# Patient Record
Sex: Female | Born: 1965 | Race: White | Hispanic: No | Marital: Married
Health system: Southern US, Community
[De-identification: ages and names within clinical notes are randomized; demographics above are authoritative.]

## PROBLEM LIST (undated history)

## (undated) DIAGNOSIS — G43909 Migraine, unspecified, not intractable, without status migrainosus: Secondary | ICD-10-CM

## (undated) DIAGNOSIS — G5603 Carpal tunnel syndrome, bilateral upper limbs: Secondary | ICD-10-CM

## (undated) DIAGNOSIS — Z98811 Dental restoration status: Secondary | ICD-10-CM

## (undated) DIAGNOSIS — I10 Essential (primary) hypertension: Secondary | ICD-10-CM

## (undated) DIAGNOSIS — F17210 Nicotine dependence, cigarettes, uncomplicated: Secondary | ICD-10-CM

## (undated) DIAGNOSIS — K760 Fatty (change of) liver, not elsewhere classified: Secondary | ICD-10-CM

## (undated) DIAGNOSIS — R7989 Other specified abnormal findings of blood chemistry: Secondary | ICD-10-CM

## (undated) DIAGNOSIS — M1990 Unspecified osteoarthritis, unspecified site: Secondary | ICD-10-CM

## (undated) DIAGNOSIS — R945 Abnormal results of liver function studies: Secondary | ICD-10-CM

## (undated) DIAGNOSIS — Z460 Encounter for fitting and adjustment of spectacles and contact lenses: Secondary | ICD-10-CM

## (undated) DIAGNOSIS — Z972 Presence of dental prosthetic device (complete) (partial): Secondary | ICD-10-CM

## (undated) DIAGNOSIS — G4700 Insomnia, unspecified: Secondary | ICD-10-CM

## (undated) DIAGNOSIS — G4750 Parasomnia, unspecified: Secondary | ICD-10-CM

## (undated) DIAGNOSIS — R609 Edema, unspecified: Secondary | ICD-10-CM

## (undated) DIAGNOSIS — Z8614 Personal history of Methicillin resistant Staphylococcus aureus infection: Secondary | ICD-10-CM

## (undated) DIAGNOSIS — I700 Atherosclerosis of aorta: Secondary | ICD-10-CM

## (undated) DIAGNOSIS — K7010 Alcoholic hepatitis without ascites: Secondary | ICD-10-CM

## (undated) HISTORY — DX: Alcoholic hepatitis without ascites: K70.10

## (undated) HISTORY — DX: Abnormal results of liver function studies: R94.5

## (undated) HISTORY — DX: Other specified abnormal findings of blood chemistry: R79.89

## (undated) HISTORY — DX: Parasomnia, unspecified: G47.50

## (undated) HISTORY — DX: Insomnia, unspecified: G47.00

## (undated) HISTORY — DX: Migraine, unspecified, not intractable, without status migrainosus: G43.909

## (undated) HISTORY — DX: Atherosclerosis of aorta: I70.0

## (undated) HISTORY — DX: Unspecified osteoarthritis, unspecified site: M19.90

## (undated) HISTORY — PX: LEEP: SHX91

## (undated) HISTORY — DX: Essential (primary) hypertension: I10

## (undated) HISTORY — DX: Fatty (change of) liver, not elsewhere classified: K76.0

## (undated) HISTORY — DX: Nicotine dependence, cigarettes, uncomplicated: F17.210

---

## 2001-05-03 ENCOUNTER — Emergency Department (HOSPITAL_COMMUNITY): Admission: EM | Admit: 2001-05-03 | Discharge: 2001-05-03 | Payer: Self-pay

## 2001-09-05 ENCOUNTER — Emergency Department (HOSPITAL_COMMUNITY): Admission: EM | Admit: 2001-09-05 | Discharge: 2001-09-05 | Payer: Self-pay | Admitting: Emergency Medicine

## 2002-05-09 ENCOUNTER — Other Ambulatory Visit: Admission: RE | Admit: 2002-05-09 | Discharge: 2002-05-09 | Payer: Self-pay | Admitting: Obstetrics and Gynecology

## 2002-10-24 ENCOUNTER — Emergency Department (HOSPITAL_COMMUNITY): Admission: EM | Admit: 2002-10-24 | Discharge: 2002-10-24 | Payer: Self-pay | Admitting: Emergency Medicine

## 2002-10-24 ENCOUNTER — Encounter: Payer: Self-pay | Admitting: Emergency Medicine

## 2002-11-09 ENCOUNTER — Encounter: Payer: Self-pay | Admitting: Orthopedic Surgery

## 2002-11-09 ENCOUNTER — Ambulatory Visit (HOSPITAL_COMMUNITY): Admission: RE | Admit: 2002-11-09 | Discharge: 2002-11-09 | Payer: Self-pay | Admitting: Orthopedic Surgery

## 2003-03-23 ENCOUNTER — Ambulatory Visit (HOSPITAL_COMMUNITY): Admission: RE | Admit: 2003-03-23 | Discharge: 2003-03-24 | Payer: Self-pay | Admitting: Neurosurgery

## 2003-03-23 ENCOUNTER — Encounter: Payer: Self-pay | Admitting: Neurosurgery

## 2003-03-23 HISTORY — PX: LUMBAR DISC SURGERY: SHX700

## 2003-04-25 ENCOUNTER — Encounter: Payer: Self-pay | Admitting: Neurosurgery

## 2003-04-25 ENCOUNTER — Ambulatory Visit (HOSPITAL_COMMUNITY): Admission: RE | Admit: 2003-04-25 | Discharge: 2003-04-25 | Payer: Self-pay | Admitting: Neurosurgery

## 2003-05-23 ENCOUNTER — Observation Stay (HOSPITAL_COMMUNITY): Admission: RE | Admit: 2003-05-23 | Discharge: 2003-05-25 | Payer: Self-pay | Admitting: Neurosurgery

## 2003-05-23 HISTORY — PX: LUMBAR DISC SURGERY: SHX700

## 2003-12-07 ENCOUNTER — Ambulatory Visit (HOSPITAL_COMMUNITY): Admission: RE | Admit: 2003-12-07 | Discharge: 2003-12-07 | Payer: Self-pay | Admitting: Neurosurgery

## 2005-06-09 DIAGNOSIS — I10 Essential (primary) hypertension: Secondary | ICD-10-CM | POA: Insufficient documentation

## 2008-09-11 ENCOUNTER — Encounter: Payer: Self-pay | Admitting: Internal Medicine

## 2008-09-12 ENCOUNTER — Inpatient Hospital Stay (HOSPITAL_COMMUNITY): Admission: EM | Admit: 2008-09-12 | Discharge: 2008-09-13 | Payer: Self-pay | Admitting: Emergency Medicine

## 2008-09-13 ENCOUNTER — Encounter (INDEPENDENT_AMBULATORY_CARE_PROVIDER_SITE_OTHER): Payer: Self-pay | Admitting: *Deleted

## 2010-02-24 ENCOUNTER — Ambulatory Visit (HOSPITAL_COMMUNITY): Admission: RE | Admit: 2010-02-24 | Discharge: 2010-02-25 | Payer: Self-pay | Admitting: Obstetrics & Gynecology

## 2010-02-24 ENCOUNTER — Encounter (INDEPENDENT_AMBULATORY_CARE_PROVIDER_SITE_OTHER): Payer: Self-pay | Admitting: Obstetrics & Gynecology

## 2010-02-24 HISTORY — PX: LAPAROSCOPIC VAGINAL HYSTERECTOMY: SUR798

## 2010-03-27 ENCOUNTER — Encounter: Payer: Self-pay | Admitting: Internal Medicine

## 2010-06-19 HISTORY — PX: LAPAROSCOPY: SHX197

## 2010-08-07 ENCOUNTER — Encounter: Payer: Self-pay | Admitting: Internal Medicine

## 2010-08-08 ENCOUNTER — Encounter: Payer: Self-pay | Admitting: Internal Medicine

## 2010-08-09 ENCOUNTER — Encounter: Payer: Self-pay | Admitting: Neurosurgery

## 2010-08-14 ENCOUNTER — Encounter: Payer: Self-pay | Admitting: Internal Medicine

## 2010-08-19 ENCOUNTER — Encounter: Payer: Self-pay | Admitting: Internal Medicine

## 2010-09-02 ENCOUNTER — Ambulatory Visit (INDEPENDENT_AMBULATORY_CARE_PROVIDER_SITE_OTHER): Payer: BC Managed Care – PPO | Admitting: Internal Medicine

## 2010-09-02 ENCOUNTER — Other Ambulatory Visit: Payer: BC Managed Care – PPO

## 2010-09-02 ENCOUNTER — Encounter (INDEPENDENT_AMBULATORY_CARE_PROVIDER_SITE_OTHER): Payer: Self-pay | Admitting: *Deleted

## 2010-09-02 ENCOUNTER — Encounter: Payer: Self-pay | Admitting: Internal Medicine

## 2010-09-02 ENCOUNTER — Other Ambulatory Visit: Payer: Self-pay | Admitting: Internal Medicine

## 2010-09-02 DIAGNOSIS — K7010 Alcoholic hepatitis without ascites: Secondary | ICD-10-CM | POA: Insufficient documentation

## 2010-09-02 DIAGNOSIS — R932 Abnormal findings on diagnostic imaging of liver and biliary tract: Secondary | ICD-10-CM

## 2010-09-02 DIAGNOSIS — F1010 Alcohol abuse, uncomplicated: Secondary | ICD-10-CM | POA: Insufficient documentation

## 2010-09-02 DIAGNOSIS — R1011 Right upper quadrant pain: Secondary | ICD-10-CM | POA: Insufficient documentation

## 2010-09-02 DIAGNOSIS — K859 Acute pancreatitis without necrosis or infection, unspecified: Secondary | ICD-10-CM | POA: Insufficient documentation

## 2010-09-02 LAB — COMPREHENSIVE METABOLIC PANEL
Albumin: 4.2 g/dL (ref 3.5–5.2)
CO2: 29 mEq/L (ref 19–32)
Calcium: 10 mg/dL (ref 8.4–10.5)
Chloride: 103 mEq/L (ref 96–112)
GFR: 145.69 mL/min (ref >60.00)
Glucose, Bld: 96 mg/dL (ref 70–99)
Potassium: 4.8 mEq/L (ref 3.5–5.1)
Sodium: 139 mEq/L (ref 135–145)
Total Bilirubin: 0.6 mg/dL (ref 0.3–1.2)
Total Protein: 7.5 g/dL (ref 6.0–8.3)

## 2010-09-02 LAB — CBC WITH DIFFERENTIAL/PLATELET
Eosinophils Relative: 1.6 % (ref 0.0–5.0)
HCT: 34.6 % — ABNORMAL LOW (ref 36.0–46.0)
Lymphocytes Relative: 18.6 % (ref 12.0–46.0)
Lymphs Abs: 1.3 10*3/uL (ref 0.7–4.0)
Monocytes Relative: 8.3 % (ref 3.0–12.0)
Platelets: 184 10*3/uL (ref 150.0–400.0)
WBC: 7.1 10*3/uL (ref 4.5–10.5)

## 2010-09-02 LAB — AMYLASE: Amylase: 117 U/L (ref 27–131)

## 2010-09-03 ENCOUNTER — Telehealth: Payer: Self-pay | Admitting: Internal Medicine

## 2010-09-04 NOTE — Discharge Summary (Signed)
Summary: Discharge Summary  NAME:  Amy Villa, Amy Villa NO.:  [id number]      MEDICAL RECORD NO.:  [id number]          PATIENT TYPE:  INP      LOCATION:  5155                         FACILITY:  [facility]      PHYSICIAN:  Isidor Holts, M.D.  DATE OF BIRTH:  April 30, 1966      DATE OF ADMISSION:  09/11/2008   DATE OF DISCHARGE:  09/13/2008                                  DISCHARGE SUMMARY      PRIMARY MEDICAL DOCTOR:  Dr. Herb Grays.      DISCHARGE DIAGNOSES:   1. Acute alcoholic hepatitis.   2. Alcohol abuse.   3. Hepatic steatosis, secondary to #2 above.   4. Hypertriglyceridemia.   5. Escherichia coli urinary tract infection.   6. Macrocytic anemia, secondary to #2.   7. Smoking history.      DISCHARGE MEDICATIONS:   1. Thiamine 100 mg p.o. daily.   2. Ciprofloxacin 250 mg p.o. b.i.d. for 5 days, from September 14, 2008.   3. Oxycodone 5 mg p.o. p.r.n. q.8 hours. A total of 21 pills have been       dispensed.   4. Ativan 1 mg p.o. b.i.d. for 2 days, then 1 mg p.o. daily for 2       days, then stop.   5. Fenofibrate 48 mg p.o. daily.      PROCEDURES:   1. Chest x-ray dated September 11, 2008, this showed a normal chest.   2. Abdominal ultrasound scan dated September 11, 2008, this showed       hepatomegaly; the liver is echogenic consistent with fatty change;       the common duct is normal at 3 mm; the gallbladder is normal       without stones, sludge or wall thickening; spleen and pancreas are       normal; both kidneys are normal with the right measuring 11.5 cm       and the left measuring 11.8 cm; the aorta and IVC are normal; there       is no ascites.      CONSULTATIONS:  Dr. Herbert Moors, gastroenterologist.      ADMISSION HISTORY:  As in H and P notes of September 12, 2008, dictated   by Dr. Vania Rea.  However, in brief this is a 45 year old   female, with known history of hypertension, now diet controlled, status   post previous back  surgeries 2004 for recurrent disk herniation of L5 to   S1 and lumbar radiculopathy, history of alcohol abuse, smoking history,   presenting with a 4-day history of jaundice preceded by a 1-week history   of abdominal pain.  The patient went to see her primary physician, Dr.   Herb Grays, who referred her to the emergency department.  She was   subsequently admitted for further evaluation, investigation and   management.      CLINICAL COURSE:   1. Acute alcoholic hepatitis.  For details of presentation, refer to  admission history above.  The patient presented with right upper       quadrant abdominal pain associated with increasing jaundice,       against a background of heavy alcohol intake.  Reportedly, she has       cut down to about 4 - 8 beers per day.  For the past 3 days prior       to admission, had not had any alcohol.  LFTs were found to be       abnormal with total bilirubin of 7.4, alkaline phosphatase 446, AST       98, ALT 42.  Abdominal ultrasound scan demonstrated heterogeneous       liver consistent with fatty infiltration, otherwise no other       abnormalities.  There was also hepatomegaly.  Hepatitis A and B       profile were negative.  GI consultation was kindly provided by Dr.       Herbert Moors, who confirmed initial clinical impression of acute       alcoholic hepatitis, and recommended supportive management.  The       patient was managed with vitamin supplements, intravenous infusion       of dextrose.  LFTs were followed, underwent steady improvement, and       as a matter of fact, on September 13, 2008, total bilirubin was down       to 4.7, alkaline phosphatase was 353, AST 67, ALT 36.  Further       improvement is anticipated.  Abdominal pain was managed with p.r.n.       opioid  analgesics.      1. Hepatic steatosis.  This was noted on abdominal ultrasound scan       done on September 11, 2008, and is likely to be of alcoholic       etiology.  The  patient of course has been strongly counseled to       discontinue alcohol use.      1. Alcohol abuse.  As noted above, the patient is a heavy drinker       although she discontinued alcohol use 3 days prior to presentation.       She was managed with vitamin supplements and placed on alcohol       withdrawal protocol with Ativan, however, during the course of her       hospitalization, she showed no acute symptoms of withdrawal.  She       has of course, been counseled appropriately.      1. Macrocytic anemia.  This is secondary to alcohol.  The patient was       found on initial presentation, to have a mild anemia with       hemoglobin 11.3, hematocrit 32.2, MCV 103.9, platelet count was       mildly diminished at 130. This is attributable to alcohol use.  The       patient's iron studies showed iron of 75, TIBC 286, percent       desaturation 26, B12 was elevated at over 2,000 and folate was       normal at 15, ferritin was 233.      1. Smoking history.  The patient does smoke about one and a half pack       of cigarettes per day, although she cut back to about half a pack       per day in the last 1 week.  She has been counseled appropriately,       but declined Nicoderm CQ patch during the course of this       hospitalization.      1. Hypertriglyceridemia.  The patient's lipid profile was as follows.       Total cholesterol 624, triglycerides 516, HDL less than 10, HDL       could not be calculated.  She has been commenced on Fenofibrate       accordingly, and recommended a low-fat diet.  We shall defer follow-       up of her lipid profile and adjustment of lipid lowering       medication, to her primary MD.      1. E. coli UTI.  The patient on initial evaluation was found to have a       positive urinary sediment consistent with urinary tract infection.       She was empirically commenced on Ciprofloxacin.  Subsequent       cultures grew over 100,000 colonies E. coli.  She is  anticipated to       complete a 7-day course of Ciprofloxacin.      DISPOSITION:  The patient was on September 13, 2008, considered   clinically stable for discharge.  She was therefore discharged   accordingly.      DIET:  Heart-healthy/low-fat diet.      ACTIVITY:  As tolerated.      FOLLOW-UP INSTRUCTIONS:  The patient is recommended to follow up with   her primary MD, Dr. Herb Grays in 1 week.  She has been instructed to   call for an appointment.      SPECIAL INSTRUCTIONS:  Dr. Herb Grays is recommended to recheck the   patient's liver function tests in approximately 1 week.               Isidor Holts, M.D.   Electronically Signed            CO/MEDQ  D:  09/13/2008  T:  09/13/2008  Job:  [id number]      cc:   Tammy R. Collins [place], M.D.   Dr. __________

## 2010-09-08 ENCOUNTER — Other Ambulatory Visit: Payer: Self-pay | Admitting: Internal Medicine

## 2010-09-08 DIAGNOSIS — R932 Abnormal findings on diagnostic imaging of liver and biliary tract: Secondary | ICD-10-CM

## 2010-09-10 NOTE — Assessment & Plan Note (Addendum)
Summary: R-OUT PANCREATIC CA/SCHED W-Amy Villa [phone]/INS BLU CROSS/MA...   History of Present Illness Visit Type: Initial Consult Primary GI MD: Amy Head MD [facility] Primary Provider: Herb Grays, MD Chief Complaint: Patient here for further evaluation of liver abnormalitiles. She has been having RUQ abdominal pain intermittently x 1 year. History of Present Illness:   45 yo ww with alcoholic liver disease and ? abnormal pancreas on CT. She has a long history of drinking alcohol and was admitted late 2010 with alcoholic hepatitis. She stopped drinking for 1-2 months and then restarted. She was seen at Dr. Alda Villa office in Jan 2012 with abnormal LFTs and the pain. CT scanning ([facility]) demonstrated a 2 cm fluid-filled structure near the tai of pancreas, thought external but ? of tumor raised.  She is having intermittent RUQ pain in RUQ and into infrascapular area and back. this has been an issue for at least the last year. Lately some right arm and chest pain issues. Using tramadol and dicyclomine wih reasonable relief of pain. Last drink was 2/8. Beer was recent drink of choice up to 12 pack in 1 day. She has been to AA, last used 5 years. She began drinking in her 58's.  She has had some jitters and shakes, appetite is improving some. Husband says she looks better and more relaxed.  She says she is still somewhat anxious, however.   GI Review of Systems    Reports abdominal pain, bloating, heartburn, loss of appetite, and  nausea.     Location of  Abdominal pain: RUQ.    Denies acid reflux, belching, chest pain, dysphagia with liquids, dysphagia with solids, vomiting, vomiting blood, weight loss, and  weight gain.      Reports liver problems.     Denies anal fissure, black tarry stools, change in bowel habit, constipation, diarrhea, diverticulosis, fecal incontinence, heme positive stool, hemorrhoids, irritable bowel syndrome, jaundice, light color stool, rectal bleeding, and  rectal  pain.   Preventive Screening-Counseling & Management  Caffeine-Diet-Exercise     Does Patient Exercise: no    Current Medications (verified): 1)  Bentyl 20 Mg Tabs (Dicyclomine Hcl) .... Take 1 Tablet By Mouth Two Times A Day 2)  Flonase 50 Mcg/act Susp (Fluticasone Propionate) .[name].. 1 Spray Each Nostril Two Times A Day 3)  Hydrochlorothiazide 25 Mg Tabs (Hydrochlorothiazide) .[name].. 1 By Mouth Once Daily 4)  Tramadol Hcl 50 Mg Tabs (Tramadol Hcl) .... Take 2 Tablets By Mouth Four Times Daily 5)  Zantac 150 Mg Tabs (Ranitidine Hcl) .... Take 1 Tablet By Mouth Once A Day  Allergies (verified): No Known Drug Allergies  Past History:  Past Medical History: Hypertension Cervical cancer Adenomyosis of uterus and endometrial polyps Alcoholic Hepatitis MRSA + screen 8/11  Past Surgical History: Back surgery x 2 Hysterectomy lap assisted vaginal 02/2010 Amy Villa) [place] Procedure x 2  Laparoscopy for adhesions 12/11 Amy Villa)  Family History: Family History of Diabetes: Mother Lung Cancer: Grandmother No FH of Colon Cancer: Family History of Colon Polyps: Twin Sister  Social History: Patient currently smokes.   1 PPD Alcohol Use - yes hx of  6-8 in evening  quit 09/09/08 then restarted at 12 drinks daily...has stopped again as of Thursday 08-28-10 Illicit Drug Use - no Occupation: IT consultant Patient does not get regular exercise.  Married 58 yo daughter and 2 stepchilden  Does Patient Exercise:  no  Review of Systems       The patient complains of anxiety-new, change in vision, confusion, itching, night  sweats, and swelling of feet/legs.         Sleep is ok. All other ROS negative except as per HPI.   Vital Signs:  Patient profile:   45 year old female Height:      63.5 inches Weight:      128 pounds Pulse rate:   96 / minute Pulse rhythm:   regular BP sitting:   102 / 62  (left arm)  Vitals Entered By: Amy Villa CMA Amy Villa) (September 02, 2010 10:35  AM)  Physical Exam  General:  older-looking than stated age,  Eyes:  slifght icterus Mouth:  tongue stud, slight icterus of palate otherwise clear missing some teeth, remaining in fair to good repair Neck:  Supple; no masses or thyromegaly. Lungs:  Clear throughout to auscultation. Heart:  Regular rate and rhythm; no murmurs, rubs,  or bruits. Extremities:  no edema Neurologic:  Alert and  oriented x4;  grossly normal neurologically. Skin:  ? a few faint spiders vs. paper oney skin changes mildly icteric  Cervical Nodes:  No significant cervical or supraclavicular adenopathy.  Psych:  Alert and cooperative. Normal mood and affect.  Office notes, op reports, hospital H&P, DC summaries, labs, CT and [place] reports, images reviewed. (scanned)  Impression & Recommendations:  Problem # 1:  ACUTE ALCOHOLIC HEPATITIS (ICD-571.1) Assessment New CT shows hepatomegaly and fatty liver, coupled with clinical scenario this diagnosis is most likely.She could also  have fibrosis or even cirrhosis and PLT's of 54 K raise concern for that. We may yet end up diagnosing cirrhosis. the associated hepatomegaly is causing her abdominal pain. she has been abstinent for several days and today indicates that she understands need to stop all alcohol forever. She says she was drinking due to back and pelvic pain helped by lysis of adhesions in December. Husband here and says he will not have EtOH in house now. Reassess with labs and see her in 6 weeks at this point. May need an EGD to screen for varices. Need to address immunizations also - she has negative HB surface Ag, HCV antibody and HAV A IgM antibody. will see if we can add Hep B surface Ab and HAV total Ab.   Orders: TLB-CBC Platelet - w/Differential (85025-CBCD) TLB-CMP (Comprehensive Metabolic Pnl) (80053-COMP) TLB-Amylase (82150-AMYL) TLB-Lipase (83690-LIPASE) TLB-PT (Protime) (85610-PTP)  Problem # 2:  NONSPECIFIC ABN FINDNG RAD&OTH EXAM BILARY  TRCT (ICD-793.3) Assessment: New 4x 2 cm ill-defined fluid density lesion on CT abd/pelvis of 08/08/10. Seems separate from bowel and pancreatic body. Images viewed ad will review with radiology. This was not seen on [place] subsequently. I favor it is related to pancreatitis or is not a true abnormality. She was drinking heavily then and lipase was elevated some. Could have a component of acute/chronic pancreatitis problems.  She will need follow-up imaging and I will recommend after review with radiologist. Though I think cancer unlikely she and huusband understand it is not ruled out. Gallbladder was distended on CT, no stones on [place], [facility] does not think any role for cholecystectomy nor do I.  Scans reviewed with radilogist and there is fluid outside of pancreas - probably from inflammation or perhaps other small ascites. None was seen on subsequent [place] Will plan for f/u CT 6-8 weeks from first so mid to late March.  Problem # 3:  PANCREATITIS (ICD-577.0) Assessment: New May be a component of this.  Problem # 4:  RUQ PAIN (ICD-789.01) Assessment: New this is coming from hepatomegaly and alcoholic hepatitis.  Should resolve over time. Continue current tx - I refilled tramadol  for 1 month. Depending upon labs we may want to switch that. am concerned about addiction potential with narcotics but might be best approach.  Problem # 5:  ALCOHOLISM (ICD-303.90) Assessment: New as per hepatitis has been to AA in past stopping on her own now She and husband seem to understand why there is a need.  Patient Instructions: 1)  Labs ordered for you to have drawn today on basement floor. 2)  Our office will call you with your lab results and recommendations for further evaluation. 3)  Please schedule a follow-up appointment in 6 weeks.  4)  Refilled Tramadol #90 take as directed. 5)  Copy sent to : Amy Grays, MD 6)  The medication list was reviewed and reconciled.  All changed / newly prescribed  medications were explained.  A complete medication list was provided to the patient / caregiver. Prescriptions: TRAMADOL HCL 50 MG TABS (TRAMADOL HCL) Take 2 tablets by mouth four times daily  #90 x 0   Entered by:   Milford Cage NCMA   Authorized by:   Iva Boop MD, [facility]   Signed by:   Milford Cage NCMA on 09/02/2010   Method used:   Print then Give to Patient   RxID:   [id number]  Patient: Amy Villa Note: All result statuses are Final unless otherwise noted.  Tests: (1) CBC Platelet w/Diff (CBCD)   White Cell Count          7.1 K/uL                    4.5-10.5   Red Cell Count       [L]  3.29 Mil/uL                 3.87-5.11   Hemoglobin                12.0 g/dL                   56.2-13.0   Hematocrit           [L]  34.6 %                      36.0-46.0   MCV                  [H]  105.2 fl                    78.0-100.0     Rechecked and verified result.   MCHC                      34.6 g/dL                   86.5-78.4   RDW                  [H]  15.1 %                      11.5-14.6   Platelet Count            184.0 K/uL                  150.0-400.0   Neutrophil %              71.3 %  43.0-77.0   Lymphocyte %              18.6 %                      12.0-46.0   Monocyte %                8.3 %                       3.0-12.0   Eosinophils%              1.6 %                       0.0-5.0   Basophils %               0.2 %                       0.0-3.0   Neutrophill Absolute      5.0 K/uL                    1.4-7.7   Lymphocyte Absolute       1.3 K/uL                    0.7-4.0   Monocyte Absolute         0.6 K/uL                    0.1-1.0  Eosinophils, Absolute                             0.1 K/uL                    0.0-0.7   Basophils Absolute        0.0 K/uL                    0.0-0.1  Tests: (2) CMP (COMP)   Sodium                    139 mEq/L                   135-145   Potassium                 4.8 mEq/L                   3.5-5.1    Chloride                  103 mEq/L                   96-112   Carbon Dioxide            29 mEq/L                    19-32   Glucose                   96 mg/dL                    25-36   BUN                       10 mg/dL  6-23   Creatinine                0.5 mg/dL                   1.6-1.0   Total Bilirubin           0.6 mg/dL                   9.6-0.4   Alkaline Phosphatase      94 U/L                      39-117   AST                  [H]  43 U/L                      0-37   ALT                  [H]  36 U/L                      0-35   Total Protein             7.5 g/dL                    5.4-0.9   Albumin                   4.2 g/dL                    8.1-1.9   Calcium                   10.0 mg/dL                  1.4-78.2   GFR                       145.69 mL/min               >60.00  Tests: (3) Amylase (AMYL)   Amylase                   117 U/L                     27-131  Tests: (4) Lipase (LIPASE)   Lipase                    49.0 U/L                    11.0-59.0  Tests: (5) PT (PTP)   Prothrombin Time     [H]  12.7 sec                    10.2-12.4   INR                  [H]  1.1 ratio                   0.8-1.0  Note: An exclamation mark (!) indicates a result that was not dispersed into the flowsheet. Document Creation Date: 09/02/2010 2:18 PM  Patient: Amy Villa Note: All result statuses are Final unless otherwise noted.  Tests: (1) Hepatitis B Surface Antigen (95621)  Hepatitis B Surface Antigen  NEGATIVE                    NEGATIVE  Tests: (2) Hepatitis A Ab, Total (34742)  Hepatitis A Ab, Total                             NEG                         NEGATIVE  Note: An exclamation mark (!) indicates a result that was not dispersed into the flowsheet. Document Creation Date: 09/03/2010 11:29 AM  Needs Hep A and Hep B vaccination should also have influenza vaccine if hasn't and should get a pneumovax if has not  had  Appended Document: R-OUT PANCREATIC CA/SCHED W-Amy Villa [phone]/INS BLU CROSS/MA... see above re: vaccine recommendations - these ar not urgent but she should have a flu vaccine and pneumovax (at PCP) if hasn't this year she needs a CT abdomen with contrast (no pelvis) just before she returns to me re: follow-up fluid collection near pancreas dx. 793.3 - tell her this looks like fluid collecton and not tumor  Appended Document: R-OUT PANCREATIC CA/SCHED W-Amy Villa [phone]/INS BLU CROSS/MA... I have reviewed Dr Marvell Fuller recommendations with the patient.  She will contact her primary care to get pneumovax, she has already had flu vacccine this year.   CT scan abdomen scheduled for 10/09/10 10:00 [facility] She is also instrructed to bring her disk with her to the CT scan.   Clinical Lists Changes  Orders: Added new Referral order of CT Abdomen (CT Abd) - Signed

## 2010-09-10 NOTE — Miscellaneous (Signed)
Summary: Orders Update-Add-on Labs  Clinical Lists Changes  Orders: Added new Test order of T-Hepatitis B Surface Antigen [phone]) - Signed Added new Test order of T-Hepatitis A Antibody (09811-91478) - Signed

## 2010-09-10 NOTE — Progress Notes (Signed)
Summary: coutesy phone call  Phone Note Call from Patient Call back at Home Phone [phone]   Call For: Dr Leone Payor Summary of Call: Request we call in her Tramdadol since it was faxed previously & did not go thru. Also wonders if she can get a courtesy phone call after so she doesnt have to keep calling pharmacy. Initial call taken by: Leanor Kail [facility],  September 03, 2010 11:09 AM  Follow-up for Phone Call        I called patient to adviced her Britta Mccreedy has called [facility] on [address] and spoke to [place] and verbally called in tramadol order.  Follow-up by: Leanor Kail [facility],  September 03, 2010 11:22 AM  Additional Follow-up for Phone Call Additional follow up Details #1::        noted Milford Cage [facility]  September 03, 2010 11:22 AM

## 2010-09-11 ENCOUNTER — Telehealth (INDEPENDENT_AMBULATORY_CARE_PROVIDER_SITE_OTHER): Payer: Self-pay

## 2010-09-16 NOTE — Letter (Signed)
Summary: [facility]   Imported By: Sherian Rein 09/11/2010 07:40:38  _____________________________________________________________________  External Attachment:    Type:   Image     Comment:   External Document

## 2010-09-16 NOTE — Progress Notes (Signed)
Summary: Medication ?s  Phone Note Call from Patient Call back at Home Phone [phone]   Caller: Patient Call For: Dr. Leone Payor Reason for Call: Talk to Nurse Summary of Call: Pt has medication questions and needs to speak with a nurse Initial call taken by: [place] Johnson,  September 11, 2010 9:21 AM  Follow-up for Phone Call        When I called patient back she stated that she is in alot of pain on right side and Tramadol is not helping and wants to know if there is something else she can take and what she can do about the pain. I told patient that I will send message to [facility] for further review and she should expect a call back shortly  Follow-up by: Ok Anis CMA,  September 11, 2010 10:54 AM  Additional Follow-up for Phone Call Additional follow up Details #1::        Patient is taking 8 tramadol a day and she is still having pain.  She is almost out of the Tramadol #90 RX you sent on 2/ 14.  She is out of Bentyl also .  Please advise if you want to refill both and for how many.  Should she be taking 8 tramadol a day? Additional Follow-up by: Darcey Nora RN, CGRN,  September 11, 2010 1:29 PM    Additional Follow-up for Phone Call Additional follow up Details #2::    stop tramadol (and Bentyl) use oxycodone this will not be long-term and will not be refilled before office visit stay off alcohol Iva Boop MD, [facility]  September 11, 2010 4:56 PM   patient was advised of Dr Marvell Fuller recommendations , she will come pick up rx in the am.  She will d/c tramadol  and bentyl once she starts on oxycodone.  She is advised to make oxycodone last for 30 days. Follow-up by: Darcey Nora RN, CGRN,  September 11, 2010 5:27 PM  New/Updated Medications: OXYCODONE HCL 5 MG TABS (OXYCODONE HCL) 1 by mouth every 4- 6 hours as needed for pain - must last til office visit 3/29 Prescriptions: OXYCODONE HCL 5 MG TABS (OXYCODONE HCL) 1 by mouth every 4- 6 hours as needed for pain - must last til  office visit 3/29  #60 x 0   Entered and Authorized by:   Iva Boop MD, [facility]   Signed by:   Iva Boop MD, [facility]: Lambert Rhonda W on 09/11/2010   Method used:   Print then Give to Patient   RxID:   [id number]

## 2010-10-03 LAB — SURGICAL PCR SCREEN: Staphylococcus aureus: POSITIVE — AB

## 2010-10-03 LAB — COMPREHENSIVE METABOLIC PANEL
ALT: 46 U/L — ABNORMAL HIGH (ref 0–35)
Albumin: 4.4 g/dL (ref 3.5–5.2)
Alkaline Phosphatase: 94 U/L (ref 39–117)
BUN: 12 mg/dL (ref 6–23)
Calcium: 9.9 mg/dL (ref 8.4–10.5)
Potassium: 3.8 mEq/L (ref 3.5–5.1)
Sodium: 137 mEq/L (ref 135–145)
Total Protein: 7.9 g/dL (ref 6.0–8.3)

## 2010-10-03 LAB — CBC
HCT: 29.5 % — ABNORMAL LOW (ref 36.0–46.0)
HCT: 38.2 % (ref 36.0–46.0)
Hemoglobin: 13 g/dL (ref 12.0–15.0)
Hemoglobin: 9.4 g/dL — ABNORMAL LOW (ref 12.0–15.0)
MCH: 33.1 pg (ref 26.0–34.0)
MCV: 97.5 fL (ref 78.0–100.0)
MCV: 99.8 fL (ref 78.0–100.0)
RBC: 3.92 MIL/uL (ref 3.87–5.11)
RDW: 17.2 % — ABNORMAL HIGH (ref 11.5–15.5)
WBC: 10.8 10*3/uL — ABNORMAL HIGH (ref 4.0–10.5)
WBC: 6.2 10*3/uL (ref 4.0–10.5)

## 2010-10-03 LAB — PREGNANCY, URINE: Preg Test, Ur: NEGATIVE

## 2010-10-03 LAB — APTT: aPTT: 28 seconds (ref 24–37)

## 2010-10-09 ENCOUNTER — Other Ambulatory Visit: Payer: BC Managed Care – PPO

## 2010-10-10 ENCOUNTER — Telehealth: Payer: Self-pay | Admitting: *Deleted

## 2010-10-10 NOTE — Telephone Encounter (Signed)
Pt. Called me and stated that she cannot afford to go and get another CT scan so she cxl'd her CT scheduled for 10/09/10.   In your notes, you recommended her to have one prior to her return visit to see you.  She wants to know if she should still come next Thursday for her follow-up appt. With you.  Please advise and I will call her and let her know what you suggest.  Thanks.

## 2010-10-10 NOTE — Telephone Encounter (Signed)
Left message for patient to call me.  Advised she had missed her CT Scan on 10/09/10 and she has an appt. To follow-up with Dr. Leone Payor next Thursday.  Asked her to call me regarding this.

## 2010-10-13 NOTE — Telephone Encounter (Signed)
Ok.  Appt. Kept.

## 2010-10-13 NOTE — Telephone Encounter (Signed)
She still needs care and I do recommend she follow-up - it is up to her

## 2010-10-16 ENCOUNTER — Ambulatory Visit: Payer: BC Managed Care – PPO | Admitting: Internal Medicine

## 2010-11-04 LAB — URINE MICROSCOPIC-ADD ON

## 2010-11-04 LAB — LIPID PANEL
Cholesterol: 624 mg/dL — ABNORMAL HIGH (ref 0–200)
Triglycerides: 516 mg/dL — ABNORMAL HIGH (ref <150)

## 2010-11-04 LAB — CBC
HCT: 29.1 % — ABNORMAL LOW (ref 36.0–46.0)
Hemoglobin: 10.6 g/dL — ABNORMAL LOW (ref 12.0–15.0)
Hemoglobin: 11.3 g/dL — ABNORMAL LOW (ref 12.0–15.0)
MCHC: 34.6 g/dL (ref 30.0–36.0)
MCHC: 34.7 g/dL (ref 30.0–36.0)
MCHC: 35.2 g/dL (ref 30.0–36.0)
MCV: 103.9 fL — ABNORMAL HIGH (ref 78.0–100.0)
MCV: 105 fL — ABNORMAL HIGH (ref 78.0–100.0)
MCV: 105 fL — ABNORMAL HIGH (ref 78.0–100.0)
Platelets: 125 10*3/uL — ABNORMAL LOW (ref 150–400)
RBC: 2.91 MIL/uL — ABNORMAL LOW (ref 3.87–5.11)
RBC: 3.1 MIL/uL — ABNORMAL LOW (ref 3.87–5.11)
RDW: 16.4 % — ABNORMAL HIGH (ref 11.5–15.5)
RDW: 16.5 % — ABNORMAL HIGH (ref 11.5–15.5)
RDW: 16.8 % — ABNORMAL HIGH (ref 11.5–15.5)
WBC: 10.1 10*3/uL (ref 4.0–10.5)

## 2010-11-04 LAB — URINALYSIS, ROUTINE W REFLEX MICROSCOPIC
Glucose, UA: NEGATIVE mg/dL
Nitrite: POSITIVE — AB
Specific Gravity, Urine: 1.016 (ref 1.005–1.030)
pH: 6.5 (ref 5.0–8.0)

## 2010-11-04 LAB — PROTIME-INR
INR: 0.9 (ref 0.00–1.49)
Prothrombin Time: 12.5 seconds (ref 11.6–15.2)

## 2010-11-04 LAB — FOLATE RBC: RBC Folate: 704 ng/mL — ABNORMAL HIGH (ref 180–600)

## 2010-11-04 LAB — COMPREHENSIVE METABOLIC PANEL
AST: 74 U/L — ABNORMAL HIGH (ref 0–37)
Albumin: 2.4 g/dL — ABNORMAL LOW (ref 3.5–5.2)
BUN: 5 mg/dL — ABNORMAL LOW (ref 6–23)
BUN: 6 mg/dL (ref 6–23)
CO2: 23 mEq/L (ref 19–32)
CO2: 27 mEq/L (ref 19–32)
Calcium: 8.2 mg/dL — ABNORMAL LOW (ref 8.4–10.5)
Calcium: 8.6 mg/dL (ref 8.4–10.5)
Calcium: 9.3 mg/dL (ref 8.4–10.5)
Chloride: 100 mEq/L (ref 96–112)
Creatinine, Ser: 0.36 mg/dL — ABNORMAL LOW (ref 0.4–1.2)
Creatinine, Ser: 0.46 mg/dL (ref 0.4–1.2)
Creatinine, Ser: 0.5 mg/dL (ref 0.4–1.2)
GFR calc Af Amer: >60 mL/min (ref >60)
GFR calc non Af Amer: >60 mL/min (ref >60)
GFR calc non Af Amer: >60 mL/min (ref >60)
GFR calc non Af Amer: >60 mL/min (ref >60)
Glucose, Bld: 104 mg/dL — ABNORMAL HIGH (ref 70–99)
Glucose, Bld: 176 mg/dL — ABNORMAL HIGH (ref 70–99)
Sodium: 134 mEq/L — ABNORMAL LOW (ref 135–145)
Total Bilirubin: 6.2 mg/dL — ABNORMAL HIGH (ref 0.3–1.2)
Total Protein: 6.6 g/dL (ref 6.0–8.3)
Total Protein: 6.9 g/dL (ref 6.0–8.3)

## 2010-11-04 LAB — RETICULOCYTES
RBC.: 2.84 MIL/uL — ABNORMAL LOW (ref 3.87–5.11)
Retic Count, Absolute: 110.8 10*3/uL (ref 19.0–186.0)
Retic Ct Pct: 3.9 % — ABNORMAL HIGH (ref 0.4–3.1)

## 2010-11-04 LAB — HEMOGLOBIN A1C
Hgb A1c MFr Bld: 5.8 % (ref 4.6–6.1)
Mean Plasma Glucose: 120 mg/dL

## 2010-11-04 LAB — LIPASE, BLOOD
Lipase: 117 U/L — ABNORMAL HIGH (ref 11–59)
Lipase: 67 U/L — ABNORMAL HIGH (ref 11–59)

## 2010-11-04 LAB — DIFFERENTIAL
Band Neutrophils: 0 % (ref 0–10)
Basophils Relative: 0 % (ref 0–1)
Blasts: 0 %
Eosinophils Relative: 0 % (ref 0–5)
Lymphs Abs: 1.7 10*3/uL (ref 0.7–4.0)
Metamyelocytes Relative: 0 %
Monocytes Absolute: 0.3 10*3/uL (ref 0.1–1.0)
Monocytes Relative: 3 % (ref 3–12)
Neutro Abs: 9.2 10*3/uL — ABNORMAL HIGH (ref 1.7–7.7)
nRBC: 0 /100 WBC

## 2010-11-04 LAB — VITAMIN B12
Vitamin B-12: >2000 pg/mL — ABNORMAL HIGH (ref 211–911)
Vitamin B-12: >2000 pg/mL — ABNORMAL HIGH (ref 211–911)

## 2010-11-04 LAB — URINE CULTURE

## 2010-11-04 LAB — FOLATE: Folate: 15 ng/mL

## 2010-12-02 NOTE — Discharge Summary (Signed)
Amy Villa, Amy Villa NO.:  [id number]   MEDICAL RECORD NO.:  [id number]          PATIENT TYPE:  INP   LOCATION:  5155                         FACILITY:  [facility]   PHYSICIAN:  Isidor Holts, M.D.  DATE OF BIRTH:  14-Jun-1966   DATE OF ADMISSION:  09/11/2008  DATE OF DISCHARGE:  09/13/2008                               DISCHARGE SUMMARY   PRIMARY MEDICAL DOCTOR:  Dr. Herb Grays.   DISCHARGE DIAGNOSES:  1. Acute alcoholic hepatitis.  2. Alcohol abuse.  3. Hepatic steatosis, secondary to #2 above.  4. Hypertriglyceridemia.  5. Escherichia coli urinary tract infection.  6. Macrocytic anemia, secondary to #2.  7. Smoking history.   DISCHARGE MEDICATIONS:  1. Thiamine 100 mg p.o. daily.  2. Ciprofloxacin 250 mg p.o. b.i.d. for 5 days, from September 14, 2008.  3. Oxycodone 5 mg p.o. p.r.n. q.8 hours. A total of 21 pills have been      dispensed.  4. Ativan 1 mg p.o. b.i.d. for 2 days, then 1 mg p.o. daily for 2      days, then stop.  5. Fenofibrate 48 mg p.o. daily.   PROCEDURES:  1. Chest x-ray dated September 11, 2008, this showed a normal chest.  2. Abdominal ultrasound scan dated September 11, 2008, this showed      hepatomegaly; the liver is echogenic consistent with fatty change;      the common duct is normal at 3 mm; the gallbladder is normal      without stones, sludge or wall thickening; spleen and pancreas are      normal; both kidneys are normal with the right measuring 11.5 cm      and the left measuring 11.8 cm; the aorta and IVC are normal; there      is no ascites.   CONSULTATIONS:  Dr. Herbert Moors, gastroenterologist.   ADMISSION HISTORY:  As in H and P notes of September 12, 2008, dictated  by Dr. Vania Rea.  However, in brief this is a 45 year old  female, with known history of hypertension, now diet controlled, status  post previous back surgeries 2004 for recurrent disk herniation of L5 to  S1 and lumbar radiculopathy, history  of alcohol abuse, smoking history,  presenting with a 4-day history of jaundice preceded by a 1-week history  of abdominal pain.  The patient went to see her primary physician, Dr.  Herb Grays, who referred her to the emergency department.  She was  subsequently admitted for further evaluation, investigation and  management.   CLINICAL COURSE:  1. Acute alcoholic hepatitis.  For details of presentation, refer to      admission history above.  The patient presented with right upper      quadrant abdominal pain associated with increasing jaundice,      against a background of heavy alcohol intake.  Reportedly, she has      cut down to about 4 - 8 beers per day.  For the past 3 days prior      to admission, had not had any alcohol.  LFTs were  found to be      abnormal with total bilirubin of 7.4, alkaline phosphatase 446, AST      98, ALT 42.  Abdominal ultrasound scan demonstrated heterogeneous      liver consistent with fatty infiltration, otherwise no other      abnormalities.  There was also hepatomegaly.  Hepatitis A and B      profile were negative.  GI consultation was kindly provided by Dr.      Herbert Moors, who confirmed initial clinical impression of acute      alcoholic hepatitis, and recommended supportive management.  The      patient was managed with vitamin supplements, intravenous infusion      of dextrose.  LFTs were followed, underwent steady improvement, and      as a matter of fact, on September 13, 2008, total bilirubin was down      to 4.7, alkaline phosphatase was 353, AST 67, ALT 36.  Further      improvement is anticipated.  Abdominal pain was managed with p.r.n.      opioid  analgesics.   1. Hepatic steatosis.  This was noted on abdominal ultrasound scan      done on September 11, 2008, and is likely to be of alcoholic      etiology.  The patient of course has been strongly counseled to      discontinue alcohol use.   1. Alcohol abuse.  As noted above, the  patient is a heavy drinker      although she discontinued alcohol use 3 days prior to presentation.      She was managed with vitamin supplements and placed on alcohol      withdrawal protocol with Ativan, however, during the course of her      hospitalization, she showed no acute symptoms of withdrawal.  She      has of course, been counseled appropriately.   1. Macrocytic anemia.  This is secondary to alcohol.  The patient was      found on initial presentation, to have a mild anemia with      hemoglobin 11.3, hematocrit 32.2, MCV 103.9, platelet count was      mildly diminished at 130. This is attributable to alcohol use.  The      patient's iron studies showed iron of 75, TIBC 286, percent      desaturation 26, B12 was elevated at over 2,000 and folate was      normal at 15, ferritin was 233.   1. Smoking history.  The patient does smoke about one and a half pack      of cigarettes per day, although she cut back to about half a pack      per day in the last 1 week.  She has been counseled appropriately,      but declined Nicoderm CQ patch during the course of this      hospitalization.   1. Hypertriglyceridemia.  The patient's lipid profile was as follows.      Total cholesterol 624, triglycerides 516, HDL less than 10, HDL      could not be calculated.  She has been commenced on Fenofibrate      accordingly, and recommended a low-fat diet.  We shall defer follow-      up of her lipid profile and adjustment of lipid lowering      medication, to her primary MD.   1. E. coli UTI.  The patient  on initial evaluation was found to have a      positive urinary sediment consistent with urinary tract infection.      She was empirically commenced on Ciprofloxacin.  Subsequent      cultures grew over 100,000 colonies E. coli.  She is anticipated to      complete a 7-day course of Ciprofloxacin.   DISPOSITION:  The patient was on September 13, 2008, considered  clinically stable for  discharge.  She was therefore discharged  accordingly.   DIET:  Heart-healthy/low-fat diet.   ACTIVITY:  As tolerated.   FOLLOW-UP INSTRUCTIONS:  The patient is recommended to follow up with  her primary MD, Dr. Herb Grays in 1 week.  She has been instructed to  call for an appointment.   SPECIAL INSTRUCTIONS:  Dr. Herb Grays is recommended to recheck the  patient's liver function tests in approximately 1 week.      Isidor Holts, M.D.  Electronically Signed     CO/MEDQ  D:  09/13/2008  T:  09/13/2008  Job:  [id number]   cc:   Tammy R. Collins [place], M.D.  Dr. __________

## 2010-12-02 NOTE — H&P (Signed)
NAMELESLE, FARON NO.:  [id number]   MEDICAL RECORD NO.:  [id number]          PATIENT TYPE:  EMS   LOCATION:  MAJO                         FACILITY:  [facility]   PHYSICIAN:  Vania Rea, M.D. DATE OF BIRTH:  1966-01-01   DATE OF ADMISSION:  09/12/2008  DATE OF DISCHARGE:                              HISTORY & PHYSICAL   PRIMARY CARE PHYSICIAN:  Dr. Herb Grays.   CHIEF COMPLAINT:  Jaundice for 4 days, abdominal pain for one week.   HISTORY OF PRESENT ILLNESS:  This is a 45 year old Caucasian lady with a  history of heavy alcohol abuse who has been in good health until about  one week ago.  She started having right upper quadrant abdominal pain  radiating through to her back and then a few days later started noticing  yelling of her eyes and dark urine.  She denied any nausea, vomiting or  diarrhea and having no fever and chills.  She has also noted reddish  discoloration of her left big toenail and also some reddish spots  appearing on her forearm.  The patient went to her primary care  physician who saw that she was acutely jaundiced and referred her to the  emergency room for further evaluation.   The patient says she does not recall ever having been on month without  drinking alcohol.  She usually is a very heavy drinker for the past few  weeks, has cut down to about 4-8 beers per day and for the past 3 days  has not been drinking anything at all.  She is not sure if she normally  goes into withdrawal.  She typically smokes one and a half packs per day  for about the past week, has cut down to half pack per day.  She denies  illicit drug use.  She has been taking no medications at all.  Recently,  she has been having menorrhagia, and currently she has been having  spotting.  Her last menstrual period was August 27, 2008.   PAST MEDICAL HISTORY:  Remote history of hypertension, but was  eventually taken off medications.   PAST SURGICAL HISTORY:  Two  back surgeries in 2004.   MEDICATIONS:  None.   ALLERGIES:  NO KNOWN DRUG ALLERGIES.   SOCIAL HISTORY:  Tobacco and alcohol use as noted above.   FAMILY HISTORY:  Significant for diabetes and also a grandmother who was  addicted to alcohol.   REVIEW OF REVIEW:  Other than noted above, a 10-point review of systems  is unremarkable.   PHYSICAL EXAMINATION:  GENERAL:  Comfortable, well-nourished, young  Caucasian lady sitting up in the stretcher, generalized deep jaundiced.  VITALS:  Her temperature is 99.0, pulse is 97, respiration 18, blood  pressure 133/90, saturating at 96% from room air.  She describes her  abdominal pain level as a 6/10.  HEENT:  Her pupils are round and equal.  Mucous membranes pink.  Her  sclerae is deeply icteric.  She is mildly dehydrated.  No cervical  lymphadenopathy or thyromegaly.  CHEST:  Clear to auscultation bilaterally.  CARDIOVASCULAR:  Regular rhythm without murmur.  ABDOMEN:  Soft, but she is tender in the right upper quadrant.  No  masses are felt.  She has no flank dullness.  EXTREMITIES:  She has no edema.  She has 2+ dorsalis pedis pulses  bilaterally.  She does not have palmer erythema.  She does not have a  liver flap.  CENTRAL NERVOUS SYSTEM:  Cranial nerves II-XII are grossly intact.  She  has no focal neurologic deficit.   LABORATORY DATA:  White count is slightly elevated 11.3.  Hemoglobin is  slightly low at 11.3.  Her platelet count is low at 130.  Her MCV is  elevated to 104.  Her complete metabolic panel is notable for sodium of  134 and potassium 3.0.  Her glucose is 104, BUN is 5 and creatinine  0.46.  Calcium is normal at 9.3, albumin is 2.8, total protein 6.9, AST  slightly elevated at 98 and ALT slightly elevated at 42.  Her alk phos  is markedly elevated at 446 and a total bilirubin is 7.4.  Serum lipase  is marginally elevated at 117 upper limits of normal at 59.  PT is 12.5,  INR is 0.9.  Normal chest x-ray shows no  acute abnormality.  Abdominal  ultrasound shows an enlarged echogenic liver, most commonly seen with  fatty change, although acute hepatitis could cause similar appearance.  Her gallbladder was normal without stones, sludge or wall thickening.  Spleen and pancreas were normal.  Both kidneys were normal.  She had no  ascites.   ASSESSMENT:  1. Acute alcoholic hepatitis.  2. Hyponatremia.  3. Hypokalemia.  4. Macrocytic anemia.  5. Chronic alcoholism.  6. Tobacco abuse.  7. Remote history of hypertension.   PLAN:  Will admit this lady for IV fluids, potassium replacement, will  workup her anemia and will also work up her hepatitis, although, it is  probably largely alcohol related.  Will put her on an alcohol withdrawal  protocol and consult with gastroenterologist for assistance with  management.  Other plans as per orders.      Vania Rea, M.D.  Electronically Signed     LC/MEDQ  D:  09/12/2008  T:  09/12/2008  Job:  [id number]   cc:   Tammy R. Collins [place], M.D.

## 2010-12-05 NOTE — Op Note (Signed)
   NAMEKYNSLIE, RINGLE                        ACCOUNT NO.:  [id number]   MEDICAL RECORD NO.:  [id number]                   PATIENT TYPE:  INP   LOCATION:  3010                                 FACILITY:  [facility]   PHYSICIAN:  Coletta Memos, M.D.                  DATE OF BIRTH:  Mar 28, 1966   DATE OF PROCEDURE:  05/23/2003  DATE OF DISCHARGE:                                 OPERATIVE REPORT   PREOPERATIVE DIAGNOSIS:  1. Recurrent disk herniation, L5-S1 left.  2. Lumbar radiculopathy.   POSTOPERATIVE DIAGNOSIS:  1. Recurrent disk herniation, L5-S1 left.  2. Lumbar radiculopathy.   OPERATION PERFORMED:  Redo diskectomy, L5-S1 left with microdissection.   SURGEON:  Coletta Memos, M.D.   ASSISTANT:  Hewitt Shorts, M.D.   ANESTHESIA:  General endotracheal.   COMPLICATIONS:  None.   INDICATIONS FOR PROCEDURE:  Axie Hayne underwent a lumbar diskectomy  at L5-S1 on March 21, 2003.  Postoperatively she experienced significant  pain.  On October 6, I ordered a repeat MRI and this showed a recurrent  disk.  I therefore recommended and she agreed to undergo redo diskectomy.   DESCRIPTION OF PROCEDURE:  Ms. Slutsky was brought to the operating room  intubated and placed under general anesthesia without difficulty.  Skin was  prepped and she was draped in sterile fashion. Using the old incision, I  opened out with a #10 blade.  I took this down through the thoracolumbar  fascia. I then with monopolar cautery in the subperiosteal fashion exposed  the lamina of L5 and of S1.  I removed scar tissue with a curet and was able  to gain entry into the spinal canal.  I then was able to remove more scar  tissue, exposing the thecal sac.  With Dr. Earl Gala help and  microdissection, I was able to then get into the disk space, remove disk  material and make sure that as far as I could tell there was no compression  of the disk space.  I did not feel that there was any loose disk  material in  the disk space.  I then irrigated the wound.  I then closed the wound in  layered fashion using Vicryl sutures.  The skin reapproximated with Vicryl,  Dermabond was used for a sterile dressing.                                               Coletta Memos, M.D.    KC/MEDQ  D:  05/23/2003  T:  05/24/2003  Job:  [id number]

## 2010-12-05 NOTE — Op Note (Signed)
[name], [name]                        ACCOUNT NO.:  [id number]   MEDICAL RECORD NO.:  [id number]                   PATIENT TYPE:  OIB   LOCATION:  3013                                 FACILITY:  [facility]   PHYSICIAN:  Coletta Memos, M.D.                  DATE OF BIRTH:  22-Sep-1965   DATE OF PROCEDURE:  03/23/2003  DATE OF DISCHARGE:                                 OPERATIVE REPORT   PREOPERATIVE DIAGNOSES:  1. Displaced disk, left L5-S1.  2. Left S1 radiculopathy.   POSTOPERATIVE DIAGNOSES:  1. Displaced disk, left L5-S1.  2. Left S1 radiculopathy.   PROCEDURE:  Left L5-S1 diskectomy with microdissection.   COMPLICATIONS:  None.   SURGEON:  Coletta Memos, M.D.   ANESTHESIA:  General endotracheal.   INDICATIONS:  Amy Villa is a 45 year old woman who presented with  significant pain in the back and left lower extremity since April 2004.  I  therefore recommended and she agreed to undergo a lumbar laminectomy and  diskectomy after all conservative measures had resulted in no improvement.   OPERATIVE NOTE:  [name] was brought to the operating room, intubated,  and placed under a general anesthetic without difficulty.  She was  positioned on a Wilson frame in a prone position.  All pressure points were  properly padded.  Her back was prepped, and she was draped in a sterile  fashion.  Using a preoperative localizer, I made a small skin incision with  a #10 blade, and I took this down to the thoracolumbar fascia.  I then  opened the thoracolumbar fascia in a semicircular fashion with a 10 blade  and reflected that medially.  I then in a subperiosteal fashion exposed the  laminae of L5 and S1.  I took an intraoperative x-ray and it showed that a  probe that I had placed was inferior to the lamina at L5-S1, showing that I  was in the correct intralaminar space for the procedure.  I then opened the  ligamentum for a 15 blade and using a Kerrison punch removed that  to expose  the epidural fat and the thecal sac.  I then retracted that medially,  encountering a very large hump of disk at L5-S1.  I did not encounter a free  fragment, and it was all subligamentous.  I then using the microscope  obtained a microdissection with pituitary rongeurs and Epstein curettes,  removed disk material until I was satisfied with the decompression of the S1  nerve root.  I expected the neural foramen with a coronary dilator and  inspected medially.  I was quite happy with the decompression and did not  feel that there was any material left causing a problem.  I therefore  irrigated the wound.  I then closed the wound in a layered fashion,  reapproximating the fascial flap with Vicryl sutures.  Subcutaneous tissue  with Vicryl sutures.  Sterile dressing used was Dermabond.  The patient  tolerated the procedure well.                                               Coletta Memos, M.D.   KC/MEDQ  D:  03/23/2003  T:  03/23/2003  Job:  [id number]

## 2011-05-19 ENCOUNTER — Other Ambulatory Visit (HOSPITAL_COMMUNITY): Payer: Self-pay | Admitting: Obstetrics & Gynecology

## 2011-05-19 DIAGNOSIS — Z1231 Encounter for screening mammogram for malignant neoplasm of breast: Secondary | ICD-10-CM

## 2011-05-22 ENCOUNTER — Ambulatory Visit (HOSPITAL_COMMUNITY): Payer: BC Managed Care – PPO

## 2011-06-04 ENCOUNTER — Other Ambulatory Visit (HOSPITAL_COMMUNITY): Payer: Self-pay | Admitting: Obstetrics

## 2011-06-04 DIAGNOSIS — R102 Pelvic and perineal pain: Secondary | ICD-10-CM

## 2011-06-08 ENCOUNTER — Ambulatory Visit (HOSPITAL_COMMUNITY)
Admission: RE | Admit: 2011-06-08 | Discharge: 2011-06-08 | Disposition: A | Discharge status: Home / Self Care | Payer: BC Managed Care – PPO | Source: Ambulatory Visit | Attending: Obstetrics | Admitting: Obstetrics

## 2011-06-08 DIAGNOSIS — Z90710 Acquired absence of both cervix and uterus: Secondary | ICD-10-CM | POA: Insufficient documentation

## 2011-06-08 DIAGNOSIS — R102 Pelvic and perineal pain: Secondary | ICD-10-CM

## 2011-06-08 DIAGNOSIS — IMO0002 Reserved for concepts with insufficient information to code with codable children: Secondary | ICD-10-CM | POA: Insufficient documentation

## 2011-06-08 DIAGNOSIS — N949 Unspecified condition associated with female genital organs and menstrual cycle: Secondary | ICD-10-CM | POA: Insufficient documentation

## 2011-06-18 ENCOUNTER — Ambulatory Visit (HOSPITAL_COMMUNITY)
Admission: RE | Admit: 2011-06-18 | Discharge: 2011-06-18 | Disposition: A | Discharge status: Home / Self Care | Payer: BC Managed Care – PPO | Source: Ambulatory Visit | Attending: Obstetrics & Gynecology | Admitting: Obstetrics & Gynecology

## 2011-06-18 DIAGNOSIS — Z1231 Encounter for screening mammogram for malignant neoplasm of breast: Secondary | ICD-10-CM

## 2012-02-18 DIAGNOSIS — G5603 Carpal tunnel syndrome, bilateral upper limbs: Secondary | ICD-10-CM

## 2012-02-18 HISTORY — DX: Carpal tunnel syndrome, bilateral upper limbs: G56.03

## 2012-03-14 ENCOUNTER — Encounter (HOSPITAL_BASED_OUTPATIENT_CLINIC_OR_DEPARTMENT_OTHER): Payer: Self-pay | Admitting: *Deleted

## 2012-03-14 NOTE — Pre-Procedure Instructions (Signed)
To come for BMET 

## 2012-03-15 ENCOUNTER — Encounter (HOSPITAL_BASED_OUTPATIENT_CLINIC_OR_DEPARTMENT_OTHER)
Admission: RE | Admit: 2012-03-15 | Discharge: 2012-03-15 | Disposition: A | Discharge status: Home / Self Care | Payer: BC Managed Care – PPO | Source: Ambulatory Visit | Attending: Orthopedic Surgery | Admitting: Orthopedic Surgery

## 2012-03-15 LAB — BASIC METABOLIC PANEL
BUN: 10 mg/dL (ref 6–23)
Chloride: 97 mEq/L (ref 96–112)
GFR calc non Af Amer: >90 mL/min (ref >90)
Glucose, Bld: 88 mg/dL (ref 70–99)
Potassium: 3.2 mEq/L — ABNORMAL LOW (ref 3.5–5.1)
Sodium: 137 mEq/L (ref 135–145)

## 2012-03-17 ENCOUNTER — Encounter (HOSPITAL_BASED_OUTPATIENT_CLINIC_OR_DEPARTMENT_OTHER): Payer: Self-pay | Admitting: Anesthesiology

## 2012-03-17 ENCOUNTER — Encounter (HOSPITAL_BASED_OUTPATIENT_CLINIC_OR_DEPARTMENT_OTHER): Payer: Self-pay | Admitting: *Deleted

## 2012-03-17 ENCOUNTER — Ambulatory Visit (HOSPITAL_BASED_OUTPATIENT_CLINIC_OR_DEPARTMENT_OTHER)
Admission: RE | Admit: 2012-03-17 | Discharge: 2012-03-17 | Disposition: A | Discharge status: Home / Self Care | Payer: BC Managed Care – PPO | Source: Ambulatory Visit | Attending: Orthopedic Surgery | Admitting: Orthopedic Surgery

## 2012-03-17 ENCOUNTER — Ambulatory Visit (HOSPITAL_BASED_OUTPATIENT_CLINIC_OR_DEPARTMENT_OTHER): Payer: BC Managed Care – PPO | Admitting: Anesthesiology

## 2012-03-17 ENCOUNTER — Encounter (HOSPITAL_BASED_OUTPATIENT_CLINIC_OR_DEPARTMENT_OTHER)
Admission: RE | Disposition: A | Discharge status: Home / Self Care | Payer: Self-pay | Source: Ambulatory Visit | Attending: Orthopedic Surgery

## 2012-03-17 DIAGNOSIS — M47812 Spondylosis without myelopathy or radiculopathy, cervical region: Secondary | ICD-10-CM | POA: Insufficient documentation

## 2012-03-17 DIAGNOSIS — Z90710 Acquired absence of both cervix and uterus: Secondary | ICD-10-CM | POA: Insufficient documentation

## 2012-03-17 DIAGNOSIS — F1020 Alcohol dependence, uncomplicated: Secondary | ICD-10-CM

## 2012-03-17 DIAGNOSIS — Z833 Family history of diabetes mellitus: Secondary | ICD-10-CM | POA: Insufficient documentation

## 2012-03-17 DIAGNOSIS — Z83719 Family history of colon polyps, unspecified: Secondary | ICD-10-CM | POA: Insufficient documentation

## 2012-03-17 DIAGNOSIS — Z8614 Personal history of Methicillin resistant Staphylococcus aureus infection: Secondary | ICD-10-CM | POA: Insufficient documentation

## 2012-03-17 DIAGNOSIS — Z8371 Family history of colonic polyps: Secondary | ICD-10-CM | POA: Insufficient documentation

## 2012-03-17 DIAGNOSIS — G5600 Carpal tunnel syndrome, unspecified upper limb: Secondary | ICD-10-CM | POA: Insufficient documentation

## 2012-03-17 HISTORY — PX: CARPAL TUNNEL RELEASE: SHX101

## 2012-03-17 HISTORY — DX: Presence of dental prosthetic device (complete) (partial): Z97.2

## 2012-03-17 HISTORY — DX: Personal history of Methicillin resistant Staphylococcus aureus infection: Z86.14

## 2012-03-17 HISTORY — DX: Dental restoration status: Z98.811

## 2012-03-17 HISTORY — DX: Edema, unspecified: R60.9

## 2012-03-17 HISTORY — DX: Unspecified osteoarthritis, unspecified site: M19.90

## 2012-03-17 HISTORY — DX: Carpal tunnel syndrome, bilateral upper limbs: G56.03

## 2012-03-17 LAB — POCT HEMOGLOBIN-HEMACUE: Hemoglobin: 12.9 g/dL (ref 12.0–15.0)

## 2012-03-17 SURGERY — CARPAL TUNNEL RELEASE
Anesthesia: Monitor Anesthesia Care | Site: Wrist | Laterality: Bilateral | Wound class: Clean

## 2012-03-17 MED ORDER — METOCLOPRAMIDE HCL 5 MG/ML IJ SOLN: 10.0000 mg | Freq: Once | INTRAMUSCULAR | Status: DC | PRN

## 2012-03-17 MED ORDER — LACTATED RINGERS IV SOLN
INTRAVENOUS | Status: DC
  Administered 2012-03-17: 13:00:00 via INTRAVENOUS

## 2012-03-17 MED ORDER — LIDOCAINE HCL (PF) 1 % IJ SOLN
INTRAMUSCULAR | Status: DC | PRN
  Administered 2012-03-17: 10 mL

## 2012-03-17 MED ORDER — CEFAZOLIN SODIUM-DEXTROSE 2-3 GM-% IV SOLR
2.0000 g | Freq: Once | INTRAVENOUS | Status: AC
  Administered 2012-03-17: 2 g via INTRAVENOUS

## 2012-03-17 MED ORDER — MIDAZOLAM HCL 5 MG/5ML IJ SOLN
INTRAMUSCULAR | Status: DC | PRN
  Administered 2012-03-17 (×2): 1 mg via INTRAVENOUS

## 2012-03-17 MED ORDER — OXYCODONE HCL 5 MG/5ML PO SOLN: 5.0000 mg | Freq: Once | ORAL | Status: DC | PRN

## 2012-03-17 MED ORDER — FENTANYL CITRATE 0.05 MG/ML IJ SOLN
INTRAMUSCULAR | Status: DC | PRN
  Administered 2012-03-17 (×2): 50 ug via INTRAVENOUS

## 2012-03-17 MED ORDER — FENTANYL CITRATE 0.05 MG/ML IJ SOLN
25.0000 ug | INTRAMUSCULAR | Status: DC | PRN
  Administered 2012-03-17: 0.5 ug via INTRAVENOUS

## 2012-03-17 MED ORDER — OXYCODONE-ACETAMINOPHEN 10-325 MG PO TABS: 1.0000 | ORAL_TABLET | ORAL | Status: AC | PRN

## 2012-03-17 MED ORDER — BUPIVACAINE HCL (PF) 0.25 % IJ SOLN
INTRAMUSCULAR | Status: DC | PRN
  Administered 2012-03-17: 10 mL

## 2012-03-17 MED ORDER — OXYCODONE HCL 5 MG PO TABS: 5.0000 mg | ORAL_TABLET | Freq: Once | ORAL | Status: DC | PRN

## 2012-03-17 SURGICAL SUPPLY — 47 items
BANDAGE CONFORM 3  STR LF (GAUZE/BANDAGES/DRESSINGS) ×2 IMPLANT
BANDAGE ELASTIC 3 VELCRO ST LF (GAUZE/BANDAGES/DRESSINGS) ×2 IMPLANT
BLADE CARPAL TUNNEL SNGL USE (BLADE) ×2 IMPLANT
BLADE SURG 15 STRL LF DISP TIS (BLADE) ×2 IMPLANT
BLADE SURG 15 STRL SS (BLADE) ×4
BNDG PLASTER X FAST 3X3 WHT LF (CAST SUPPLIES) ×2 IMPLANT
BNDG PLSTR 9X3 FST ST WHT (CAST SUPPLIES) ×1
BRUSH SCRUB EZ PLAIN DRY (MISCELLANEOUS) ×2 IMPLANT
CLOTH BEACON ORANGE TIMEOUT ST (SAFETY) ×2 IMPLANT
CORDS BIPOLAR (ELECTRODE) ×2 IMPLANT
COVER MAYO STAND STRL (DRAPES) ×2 IMPLANT
COVER TABLE BACK 60X90 (DRAPES) ×2 IMPLANT
CUFF TOURNIQUET SINGLE 18IN (TOURNIQUET CUFF) ×1 IMPLANT
DRAIN PENROSE 1/4X12 LTX STRL (WOUND CARE) IMPLANT
DRAPE EXTREMITY T 121X128X90 (DRAPE) ×2 IMPLANT
DRAPE SURG 17X23 STRL (DRAPES) ×2 IMPLANT
DRSG EMULSION OIL 3X3 NADH (GAUZE/BANDAGES/DRESSINGS) ×2 IMPLANT
GAUZE SPONGE 4X4 16PLY XRAY LF (GAUZE/BANDAGES/DRESSINGS) IMPLANT
GLOVE BIO SURGEON STRL SZ 6.5 (GLOVE) ×1 IMPLANT
GLOVE BIOGEL M STRL SZ7.5 (GLOVE) ×1 IMPLANT
GLOVE SS BIOGEL STRL SZ 8 (GLOVE) ×1 IMPLANT
GLOVE SUPERSENSE BIOGEL SZ 8 (GLOVE) ×1
GOWN PREVENTION PLUS XLARGE (GOWN DISPOSABLE) ×2 IMPLANT
GOWN PREVENTION PLUS XXLARGE (GOWN DISPOSABLE) ×2 IMPLANT
LOOP VESSEL MAXI BLUE (MISCELLANEOUS) IMPLANT
NDL HYPO 25X1 1.5 SAFETY (NEEDLE) ×1 IMPLANT
NDL SAFETY ECLIPSE 18X1.5 (NEEDLE) ×1 IMPLANT
NEEDLE HYPO 18GX1.5 SHARP (NEEDLE) ×2
NEEDLE HYPO 22GX1.5 SAFETY (NEEDLE) ×2 IMPLANT
NEEDLE HYPO 25X1 1.5 SAFETY (NEEDLE) ×2 IMPLANT
NS IRRIG 1000ML POUR BTL (IV SOLUTION) ×2 IMPLANT
PACK BASIN DAY SURGERY FS (CUSTOM PROCEDURE TRAY) ×2 IMPLANT
PAD ALCOHOL SWAB (MISCELLANEOUS) ×16 IMPLANT
PAD CAST 3X4 CTTN HI CHSV (CAST SUPPLIES) ×2 IMPLANT
PADDING CAST ABS 4INX4YD NS (CAST SUPPLIES) ×1
PADDING CAST ABS COTTON 4X4 ST (CAST SUPPLIES) ×1 IMPLANT
PADDING CAST COTTON 3X4 STRL (CAST SUPPLIES) ×4
SPONGE GAUZE 4X4 12PLY (GAUZE/BANDAGES/DRESSINGS) IMPLANT
STOCKINETTE 4X48 STRL (DRAPES) ×2 IMPLANT
SUT PROLENE 4 0 PS 2 18 (SUTURE) ×2 IMPLANT
SYR BULB 3OZ (MISCELLANEOUS) ×2 IMPLANT
SYR CONTROL 10ML LL (SYRINGE) ×4 IMPLANT
TOWEL OR 17X24 6PK STRL BLUE (TOWEL DISPOSABLE) ×2 IMPLANT
TOWEL OR NON WOVEN STRL DISP B (DISPOSABLE) ×2 IMPLANT
TRAY DSU PREP LF (CUSTOM PROCEDURE TRAY) ×2 IMPLANT
UNDERPAD 30X30 INCONTINENT (UNDERPADS AND DIAPERS) ×2 IMPLANT
WATER STERILE IRR 1000ML POUR (IV SOLUTION) ×2 IMPLANT

## 2012-03-17 NOTE — Anesthesia Postprocedure Evaluation (Signed)
Anesthesia Post Note  Patient: Amy Villa  Procedure(s) Performed: Procedure(s) (LRB): CARPAL TUNNEL RELEASE (Bilateral)  Anesthesia type: MAC  Patient location: PACU  Post pain: Pain level controlled  Post assessment: Patient's Cardiovascular Status Stable  Last Vitals:  Filed Vitals:   03/17/12 1432  BP: 119/74  Pulse: 78  Temp: 36.9 C  Resp: 20    Post vital signs: Reviewed and stable  Level of consciousness: alert  Complications: No apparent anesthesia complications

## 2012-03-17 NOTE — Anesthesia Preprocedure Evaluation (Signed)
Anesthesia Evaluation  Patient identified by MRN, date of birth, ID band Patient awake    Reviewed: Allergy & Precautions, H&P , NPO status , Patient's Chart, lab work & pertinent test results, reviewed documented beta blocker date and time   Airway Mallampati: II TM Distance: >3 FB Neck ROM: full    Dental   Pulmonary neg pulmonary ROS,  breath sounds clear to auscultation        Cardiovascular negative cardio ROS  Rhythm:regular     Neuro/Psych PSYCHIATRIC DISORDERS  Neuromuscular disease    GI/Hepatic negative GI ROS, (+)     substance abuse  alcohol use, Hepatitis -, Toxin Related  Endo/Other  negative endocrine ROS  Renal/GU negative Renal ROS  negative genitourinary   Musculoskeletal   Abdominal   Peds  Hematology negative hematology ROS (+)   Anesthesia Other Findings See surgeon's H&P   Reproductive/Obstetrics negative OB ROS                           Anesthesia Physical Anesthesia Plan  ASA: III  Anesthesia Plan: MAC   Post-op Pain Management:    Induction: Intravenous  Airway Management Planned: Simple Face Mask  Additional Equipment:   Intra-op Plan:   Post-operative Plan:   Informed Consent: I have reviewed the patients History and Physical, chart, labs and discussed the procedure including the risks, benefits and alternatives for the proposed anesthesia with the patient or authorized representative who has indicated his/her understanding and acceptance.   Dental Advisory Given  Plan Discussed with: CRNA and Surgeon  Anesthesia Plan Comments:         Anesthesia Quick Evaluation

## 2012-03-17 NOTE — H&P (Signed)
Amy Villa is an 46 y.o. female.   Chief Complaint: left and right CTS HPI: Amy KitchenMarland KitchenPatient presents for evaluation and treatment of the of their upper extremity predicament. The patient denies neck back chest or of abdominal pain. The patient notes that they have no lower extremity problems. The patient from primarily complains of the upper extremity pain noted.  Past Medical History  Diagnosis Date  . Arthritis     neck  . Carpal tunnel syndrome on both sides 02/2012  . Wears partial dentures     lower  . Dental crowns present     x 2 upper front  . History of MRSA infection   . Fluid retention     Past Surgical History  Procedure Date  . Laparoscopic vaginal hysterectomy 02/24/2010  . Leep     x 2  . Laparoscopy 06/2010    for adhesions  . Lumbar disc surgery 03/23/2003    left L5-S1 discectomy with microdissection  . Lumbar disc surgery 05/23/2003    redo discectomy L5-S1 with microdissection    Family History  Problem Relation Age of Onset  . Diabetes Mother   . Lung cancer    . Colon cancer      neg. family history  . Colon polyps Sister    Social History:  reports that she has quit smoking. She has never used smokeless tobacco. She reports that she does not drink alcohol or use illicit drugs.  Allergies: No Known Allergies  Medications Prior to Admission  Medication Sig Dispense Refill  . gabapentin (NEURONTIN) 300 MG capsule Take 600 mg by mouth 5 (five) times daily.      . hydrochlorothiazide 25 MG tablet Take 25 mg by mouth daily.       Amy Villa HYDROcodone-acetaminophen (LORTAB) 10-500 MG per tablet Take 1 tablet by mouth 5 (five) times daily.        No results found for this or any previous visit (from the past 48 hour(s)). No results found.  Review of Systems  Constitutional: Negative.   HENT: Negative.   Eyes: Negative.   Respiratory: Negative.   Cardiovascular: Negative.   Gastrointestinal: Negative.   Genitourinary: Negative.   Skin: Negative.     Neurological: Negative.   Endo/Heme/Allergies: Negative.     Blood pressure 125/73, pulse 89, temperature 98.6 F (37 C), temperature source Oral, resp. rate 16, height 5' 3.5" (1.613 m), weight 52.787 kg (116 lb 6 oz), SpO2 98.00%. Physical Exam Bilateral CTS with pain and numbness with provacative testing .Amy KitchenThe patient is alert and oriented in no acute distress the patient complains of pain in the affected upper extremity. The patient is noted to have a normal HEENT exam. Lung fields show equal chest expansion and no shortness of breath abdomen exam is nontender without distention. Lower extremity examination does not show any fracture dislocation or blood clot symptoms. Pelvis is stable neck and back are stable and nontender  Assessment/Plan .Amy KitchenWe are planning surgery for your upper extremity. The risk and benefits of surgery include risk of bleeding infection anesthesia damage to normal structures and failure of the surgery to accomplish its intended goals of relieving symptoms and restoring function with this in mind we'll going to proceed. I have specifically discussed with the patient the pre-and postoperative regime and the does and don'ts and risk and benefits in great detail. Risk and benefits of surgery also include risk of dystrophy chronic nerve pain failure of the healing process to go onto completion and other  inherent risks of surgery The relavent the pathophysiology of the disease/injury process, as well as the alternatives for treatment and postoperative course of action has been discussed in great detail with the patient who desires to proceed.  We will do everything in our power to help you (the patient) restore function to the upper extremity. Is a pleasure to see this patient today.   Karen Chafe 03/17/2012, 12:40 PM

## 2012-03-17 NOTE — Transfer of Care (Signed)
Immediate Anesthesia Transfer of Care Note  Patient: Amy Villa  Procedure(s) Performed: Procedure(s) (LRB): CARPAL TUNNEL RELEASE (Bilateral)  Patient Location: PACU  Anesthesia Type: MAC  Level of Consciousness: awake, alert  and oriented  Airway & Oxygen Therapy: Patient Spontanous Breathing and Patient connected to face mask oxygen  Post-op Assessment: Report given to PACU RN and Post -op Vital signs reviewed and stable  Post vital signs: Reviewed and stable  Complications: No apparent anesthesia complications

## 2012-03-17 NOTE — Op Note (Signed)
Dictation #[id number] Dominica Severin MD

## 2012-03-18 ENCOUNTER — Encounter (HOSPITAL_BASED_OUTPATIENT_CLINIC_OR_DEPARTMENT_OTHER): Payer: Self-pay | Admitting: Orthopedic Surgery

## 2012-03-18 NOTE — Op Note (Signed)
Amy Villa, Amy Villa NO.:  [id number]  MEDICAL RECORD NO.:  [id number]  LOCATION:                                 FACILITY:  PHYSICIAN:  Dionne Ano. Kiernan Atkerson, M.D.DATE OF BIRTH:  Jul 20, 1966  DATE OF PROCEDURE:  03/17/2012 DATE OF DISCHARGE:                              OPERATIVE REPORT   PREOPERATIVE DIAGNOSIS:  Bilateral carpal tunnel syndrome.  POSTOPERATIVE DIAGNOSIS:  Bilateral carpal tunnel syndrome.  PROCEDURES: 1. Left median nerve/peripheral nerve block of wrist and forearm     level, purposes of carpal tunnel release. 2. Left limb to open carpal tunnel release. 3. Right carpal tunnel injection.  SURGEON:  Dionne Ano. Amanda Pea, M.D.  ASSISTANT:  None.  COMPLICATIONS:  None.  ANESTHESIA:  Peripheral nerve block with IV sedation given the patient in awake, alert and oriented the entire case.  TOURNIQUET TIME:  Less than 10 minutes.  INDICATIONS FOR THE PROCEDURE:  Pleasant female, presents with above- mentioned diagnosis.  She has severe carpal tunnel syndrome as well as a host of medical issues.  We have discussed the risks and benefits, do's and don'ts etc., and she desires to proceed the above-mentioned operative intervention as detailed above and below.  OPERATION IN DETAIL:  The patient was seen by myself and anesthesia, taken to the operating suite, underwent smooth induction of general anesthesia.  Peripheral nerve/peripheral nerve block of wrist and forearm level about the left upper extremity.  She was then prepped and draped with Betadine scrub and paint x2.  She was given 2 of Versed and 2 of fentanyl.  She tolerated this well.  She was awake, alert and oriented the entire case.  Under sterile field, time-out was called, pre and postop checklist complete and the patient underwent insufflation of the tourniquet to 250 mmHg.  Dissection was carried down through the skin incision.  Retractor was placed and following this, the disk  space and transverse carpal ligament was identified and released under 4.0 loupe magnification.  Fat pad egressed nicely.  Following this, distal- to-proximal dissection was carried out until adequate room was available for canal.  Plates were revised 1, 2 and 3, which were placed just under the proximal leading leaflet of transverse carpal ligament.  The patient tolerated this well.  There were no complicating features.  Following this, the patient then underwent placement of the security clip, operator was reduced to engage and the security knife was placed and the security clip effectively released in a proximal leaflet over transverse carpal ligament.  The patient tolerated this well.  There were no complicating features.  All sponge, needle and instrument counts were reported as correct.  I deflated the tourniquet, secured hemostasis. Following this, I closed the wound after copious irrigation was applied. She was awake, alert and oriented, had no complicating features.  The median nerve was hyperemic, intact and nicely released without iatrogenic injury.  I was pleased with this and the findings and felt very secure about our surgical endeavors.  Following this, the patient was bandaged.  Following this, the patient underwent right carpal tunnel injection with 2 mL of lidocaine and 1 mL of Celestone.  She tolerated this beautifully.  There were no complicating features.  Once this was completed, she was taken to the recovery room in stable condition.  She will be monitored in the recovery room and then discharged home on Percocet and our general observation and elevation of protocol.     Dionne Ano. Amanda Pea, M.D.     [facility]  D:  03/17/2012  T:  03/18/2012  Job:  [id number]

## 2012-06-21 ENCOUNTER — Encounter (HOSPITAL_COMMUNITY): Payer: Self-pay | Admitting: Pharmacy Technician

## 2012-06-23 ENCOUNTER — Other Ambulatory Visit: Payer: Self-pay | Admitting: Orthopedic Surgery

## 2012-06-24 ENCOUNTER — Ambulatory Visit (HOSPITAL_COMMUNITY)
Admission: RE | Admit: 2012-06-24 | Discharge: 2012-06-24 | Disposition: A | Discharge status: Home / Self Care | Payer: BC Managed Care – PPO | Source: Ambulatory Visit | Attending: Orthopedic Surgery | Admitting: Orthopedic Surgery

## 2012-06-24 ENCOUNTER — Encounter (HOSPITAL_COMMUNITY): Payer: Self-pay

## 2012-06-24 ENCOUNTER — Encounter (HOSPITAL_COMMUNITY)
Admission: RE | Admit: 2012-06-24 | Discharge: 2012-06-24 | Disposition: A | Discharge status: Home / Self Care | Payer: BC Managed Care – PPO | Source: Ambulatory Visit | Attending: Orthopedic Surgery | Admitting: Orthopedic Surgery

## 2012-06-24 DIAGNOSIS — I10 Essential (primary) hypertension: Secondary | ICD-10-CM | POA: Insufficient documentation

## 2012-06-24 DIAGNOSIS — Z87891 Personal history of nicotine dependence: Secondary | ICD-10-CM | POA: Insufficient documentation

## 2012-06-24 LAB — BASIC METABOLIC PANEL
BUN: 14 mg/dL (ref 6–23)
Calcium: 9.2 mg/dL (ref 8.4–10.5)
Creatinine, Ser: 0.55 mg/dL (ref 0.50–1.10)
GFR calc Af Amer: >90 mL/min (ref >90)
GFR calc non Af Amer: >90 mL/min (ref >90)

## 2012-06-24 LAB — CBC
MCHC: 34.5 g/dL (ref 30.0–36.0)
RDW: 13.2 % (ref 11.5–15.5)

## 2012-06-24 MED ORDER — CHLORHEXIDINE GLUCONATE 4 % EX LIQD: 60.0000 mL | Freq: Once | CUTANEOUS | Status: DC

## 2012-06-24 NOTE — Pre-Procedure Instructions (Signed)
20 MYRIAH BOGGUS  06/24/2012   Your procedure is scheduled on:  Thursday December 12, at 1430 Pm  Report to [facility] at 1`230 PM.  Call this number if you have problems the morning of surgery: [phone]   Remember:   Do not eat food or drink:After Midnight.Wednesday      Take these medicines the morning of surgery with A SIP OF WATER: Neurontin, and Lortab if needed   Do not wear jewelry, make-up or nail polish.  Do not wear lotions, powders, or perfumes. You may wear deodorant.  Do not shave 48 hours prior to surgery.   Do not bring valuables to the hospital.  Contacts, dentures or bridgework may not be worn into surgery.  Leave suitcase in the car. After surgery it may be brought to your room.  For patients admitted to the hospital, checkout time is 11:00 AM the day of discharge.   Patients discharged the day of surgery will not be allowed to drive home.  Name and phone number of your driver: Spouse  Special Instructions: Shower using CHG 2 nights before surgery and the night before surgery.  If you shower the day of surgery use CHG.  Use special wash - you have one bottle of CHG for all showers.  You should use approximately 1/3 of the bottle for each shower.   Please read over the following fact sheets that you were given: Pain Booklet, Coughing and Deep Breathing, Blood Transfusion Information, MRSA Information and Surgical Site Infection Prevention

## 2012-06-29 MED ORDER — CEFAZOLIN SODIUM-DEXTROSE 2-3 GM-% IV SOLR
2.0000 g | INTRAVENOUS | Status: AC
  Administered 2012-06-30: 2 g via INTRAVENOUS
  Filled 2012-06-29: qty 50

## 2012-06-30 ENCOUNTER — Encounter (HOSPITAL_COMMUNITY): Payer: Self-pay | Admitting: *Deleted

## 2012-06-30 ENCOUNTER — Ambulatory Visit (HOSPITAL_COMMUNITY)
Admission: RE | Admit: 2012-06-30 | Discharge: 2012-06-30 | Disposition: A | Discharge status: Home / Self Care | Payer: BC Managed Care – PPO | Source: Ambulatory Visit | Attending: Orthopedic Surgery | Admitting: Orthopedic Surgery

## 2012-06-30 ENCOUNTER — Encounter (HOSPITAL_COMMUNITY)
Admission: RE | Disposition: A | Discharge status: Home / Self Care | Payer: Self-pay | Source: Ambulatory Visit | Attending: Orthopedic Surgery

## 2012-06-30 ENCOUNTER — Encounter (HOSPITAL_COMMUNITY): Payer: Self-pay | Admitting: Anesthesiology

## 2012-06-30 ENCOUNTER — Ambulatory Visit (HOSPITAL_COMMUNITY): Payer: BC Managed Care – PPO | Admitting: Anesthesiology

## 2012-06-30 DIAGNOSIS — Z8371 Family history of colonic polyps: Secondary | ICD-10-CM | POA: Insufficient documentation

## 2012-06-30 DIAGNOSIS — M47812 Spondylosis without myelopathy or radiculopathy, cervical region: Secondary | ICD-10-CM | POA: Insufficient documentation

## 2012-06-30 DIAGNOSIS — I10 Essential (primary) hypertension: Secondary | ICD-10-CM | POA: Insufficient documentation

## 2012-06-30 DIAGNOSIS — Z833 Family history of diabetes mellitus: Secondary | ICD-10-CM | POA: Insufficient documentation

## 2012-06-30 DIAGNOSIS — Z83719 Family history of colon polyps, unspecified: Secondary | ICD-10-CM | POA: Insufficient documentation

## 2012-06-30 DIAGNOSIS — Z87891 Personal history of nicotine dependence: Secondary | ICD-10-CM | POA: Insufficient documentation

## 2012-06-30 DIAGNOSIS — G5600 Carpal tunnel syndrome, unspecified upper limb: Secondary | ICD-10-CM

## 2012-06-30 DIAGNOSIS — Z8614 Personal history of Methicillin resistant Staphylococcus aureus infection: Secondary | ICD-10-CM | POA: Insufficient documentation

## 2012-06-30 HISTORY — PX: CARPAL TUNNEL RELEASE: SHX101

## 2012-06-30 SURGERY — CARPAL TUNNEL RELEASE
Anesthesia: Choice | Site: Wrist | Laterality: Right

## 2012-06-30 MED ORDER — FENTANYL CITRATE 0.05 MG/ML IJ SOLN: 25.0000 ug | INTRAMUSCULAR | Status: DC | PRN

## 2012-06-30 MED ORDER — OXYCODONE HCL 5 MG/5ML PO SOLN: 5.0000 mg | Freq: Once | ORAL | Status: AC | PRN

## 2012-06-30 MED ORDER — PROPOFOL 10 MG/ML IV BOLUS
INTRAVENOUS | Status: DC | PRN
  Administered 2012-06-30: 200 mg via INTRAVENOUS

## 2012-06-30 MED ORDER — DEXTROSE 5 % IV SOLN
INTRAVENOUS | Status: DC | PRN
  Administered 2012-06-30: 15:00:00 via INTRAVENOUS

## 2012-06-30 MED ORDER — ARTIFICIAL TEARS OP OINT
TOPICAL_OINTMENT | OPHTHALMIC | Status: DC | PRN
  Administered 2012-06-30: 1 via OPHTHALMIC

## 2012-06-30 MED ORDER — FENTANYL CITRATE 0.05 MG/ML IJ SOLN
100.0000 ug | Freq: Once | INTRAMUSCULAR | Status: AC
  Administered 2012-06-30: 100 ug via INTRAVENOUS

## 2012-06-30 MED ORDER — OXYCODONE-ACETAMINOPHEN 10-325 MG PO TABS: 1.0000 | ORAL_TABLET | ORAL | Status: DC | PRN

## 2012-06-30 MED ORDER — 0.9 % SODIUM CHLORIDE (POUR BTL) OPTIME
TOPICAL | Status: DC | PRN
  Administered 2012-06-30: 1000 mL

## 2012-06-30 MED ORDER — BUPIVACAINE HCL (PF) 0.25 % IJ SOLN
INTRAMUSCULAR | Status: DC | PRN
  Administered 2012-06-30: 20 mL

## 2012-06-30 MED ORDER — SODIUM CHLORIDE 0.45 % IV SOLN: INTRAVENOUS | Status: DC

## 2012-06-30 MED ORDER — OXYCODONE HCL 5 MG PO TABS: 5.0000 mg | ORAL_TABLET | Freq: Once | ORAL | Status: AC | PRN

## 2012-06-30 MED ORDER — LIDOCAINE HCL (CARDIAC) 20 MG/ML IV SOLN
INTRAVENOUS | Status: DC | PRN
  Administered 2012-06-30: 70 mg via INTRAVENOUS

## 2012-06-30 MED ORDER — FENTANYL CITRATE 0.05 MG/ML IJ SOLN
INTRAMUSCULAR | Status: DC | PRN
  Administered 2012-06-30: 50 ug via INTRAVENOUS

## 2012-06-30 MED ORDER — FENTANYL CITRATE 0.05 MG/ML IJ SOLN
INTRAMUSCULAR | Status: AC
  Filled 2012-06-30: qty 2

## 2012-06-30 MED ORDER — BUPIVACAINE HCL (PF) 0.25 % IJ SOLN
INTRAMUSCULAR | Status: AC
  Filled 2012-06-30: qty 30

## 2012-06-30 MED ORDER — LACTATED RINGERS IV SOLN
INTRAVENOUS | Status: DC
  Administered 2012-06-30: 14:00:00 via INTRAVENOUS

## 2012-06-30 MED ORDER — ONDANSETRON HCL 4 MG/2ML IJ SOLN: 4.0000 mg | Freq: Four times a day (QID) | INTRAMUSCULAR | Status: DC | PRN

## 2012-06-30 MED ORDER — LACTATED RINGERS IV SOLN
INTRAVENOUS | Status: DC | PRN
  Administered 2012-06-30 (×2): via INTRAVENOUS

## 2012-06-30 MED ORDER — MIDAZOLAM HCL 5 MG/5ML IJ SOLN
INTRAMUSCULAR | Status: DC | PRN
  Administered 2012-06-30: 2 mg via INTRAVENOUS

## 2012-06-30 MED ORDER — HYDROCODONE-ACETAMINOPHEN 10-500 MG PO TABS: 1.0000 | ORAL_TABLET | ORAL | Status: DC | PRN

## 2012-06-30 MED ORDER — ONDANSETRON HCL 4 MG/2ML IJ SOLN
INTRAMUSCULAR | Status: DC | PRN
  Administered 2012-06-30: 4 mg via INTRAVENOUS

## 2012-06-30 SURGICAL SUPPLY — 45 items
BANDAGE ELASTIC 3 VELCRO ST LF (GAUZE/BANDAGES/DRESSINGS) ×4 IMPLANT
BANDAGE ELASTIC 4 VELCRO ST LF (GAUZE/BANDAGES/DRESSINGS) ×2 IMPLANT
BANDAGE GAUZE ELAST BULKY 4 IN (GAUZE/BANDAGES/DRESSINGS) ×2 IMPLANT
BNDG ADH 5X4 AIR PERM ELC (GAUZE/BANDAGES/DRESSINGS) ×1
BNDG COHESIVE 4X5 WHT NS (GAUZE/BANDAGES/DRESSINGS) ×1 IMPLANT
CLOTH BEACON ORANGE TIMEOUT ST (SAFETY) ×2 IMPLANT
CORDS BIPOLAR (ELECTRODE) ×2 IMPLANT
COVER SURGICAL LIGHT HANDLE (MISCELLANEOUS) ×2 IMPLANT
CUFF TOURNIQUET SINGLE 18IN (TOURNIQUET CUFF) ×2 IMPLANT
CUFF TOURNIQUET SINGLE 24IN (TOURNIQUET CUFF) IMPLANT
DRAPE SURG 17X23 STRL (DRAPES) ×2 IMPLANT
EVACUATOR 1/8 PVC DRAIN (DRAIN) IMPLANT
GAUZE XEROFORM 1X8 LF (GAUZE/BANDAGES/DRESSINGS) ×2 IMPLANT
GLOVE BIOGEL M STRL SZ7.5 (GLOVE) ×2 IMPLANT
GLOVE SS BIOGEL STRL SZ 8 (GLOVE) ×1 IMPLANT
GLOVE SUPERSENSE BIOGEL SZ 8 (GLOVE) ×1
GOWN PREVENTION PLUS XLARGE (GOWN DISPOSABLE) ×2 IMPLANT
GOWN STRL NON-REIN LRG LVL3 (GOWN DISPOSABLE) ×4 IMPLANT
GOWN STRL REIN XL XLG (GOWN DISPOSABLE) ×4 IMPLANT
KIT BASIN OR (CUSTOM PROCEDURE TRAY) ×2 IMPLANT
KIT ROOM TURNOVER OR (KITS) ×2 IMPLANT
LOOP VESSEL MAXI BLUE (MISCELLANEOUS) IMPLANT
NDL HYPO 25GX1X1/2 BEV (NEEDLE) IMPLANT
NEEDLE HYPO 25GX1X1/2 BEV (NEEDLE) IMPLANT
NS IRRIG 1000ML POUR BTL (IV SOLUTION) ×2 IMPLANT
PACK ORTHO EXTREMITY (CUSTOM PROCEDURE TRAY) ×2 IMPLANT
PAD ARMBOARD 7.5X6 YLW CONV (MISCELLANEOUS) ×4 IMPLANT
PAD CAST 4YDX4 CTTN HI CHSV (CAST SUPPLIES) ×2 IMPLANT
PADDING CAST COTTON 4X4 STRL (CAST SUPPLIES) ×4
SOLUTION BETADINE 4OZ (MISCELLANEOUS) ×2 IMPLANT
SPONGE GAUZE 4X4 12PLY (GAUZE/BANDAGES/DRESSINGS) ×2 IMPLANT
SPONGE SCRUB IODOPHOR (GAUZE/BANDAGES/DRESSINGS) ×2 IMPLANT
SUCTION FRAZIER TIP 10 FR DISP (SUCTIONS) IMPLANT
SUT PROLENE 4 0 PS 2 18 (SUTURE) ×2 IMPLANT
SUT VIC AB 2-0 CT1 27 (SUTURE)
SUT VIC AB 2-0 CT1 TAPERPNT 27 (SUTURE) IMPLANT
SUT VIC AB 3-0 FS2 27 (SUTURE) IMPLANT
SYR CONTROL 10ML LL (SYRINGE) IMPLANT
SYSTEM CHEST DRAIN TLS 7FR (DRAIN) IMPLANT
TOWEL OR 17X24 6PK STRL BLUE (TOWEL DISPOSABLE) ×2 IMPLANT
TOWEL OR 17X26 10 PK STRL BLUE (TOWEL DISPOSABLE) ×2 IMPLANT
TUBE CONNECTING 12X1/4 (SUCTIONS) IMPLANT
TUBE EVACUATION TLS (MISCELLANEOUS) ×2 IMPLANT
UNDERPAD 30X30 INCONTINENT (UNDERPADS AND DIAPERS) ×2 IMPLANT
WATER STERILE IRR 1000ML POUR (IV SOLUTION) ×2 IMPLANT

## 2012-06-30 NOTE — H&P (Signed)
Amy Villa is an 46 y.o. female.   Chief Complaint: Patient presents for R CTR HPI: Marland KitchenMarland KitchenPatient presents for evaluation and treatment of the of their upper extremity predicament. The patient denies neck back chest or of abdominal pain. The patient notes that they have no lower extremity problems. The patient from primarily complains of the upper extremity pain noted.  Past Medical History  Diagnosis Date  . Arthritis     neck  . Carpal tunnel syndrome on both sides 02/2012  . Wears partial dentures     lower  . Dental crowns present     x 2 upper front  . History of MRSA infection   . Fluid retention   . Hypertension     Past Surgical History  Procedure Date  . Laparoscopic vaginal hysterectomy 02/24/2010  . Leep     x 2  . Laparoscopy 06/2010    for adhesions  . Lumbar disc surgery 03/23/2003    left L5-S1 discectomy with microdissection  . Lumbar disc surgery 05/23/2003    redo discectomy L5-S1 with microdissection  . Carpal tunnel release 03/17/2012    Procedure: CARPAL TUNNEL RELEASE;  Surgeon: Dominica Severin, MD;  Location: [facility];  Service: Orthopedics;  Laterality: Bilateral;  left limited open carpal tunnel release. right carpal tunnel injection with 1cc of celestone and 2cc of marcaine.25%    Family History  Problem Relation Age of Onset  . Diabetes Mother   . Lung cancer    . Colon cancer      neg. family history  . Colon polyps Sister    Social History:  reports that she quit smoking about 2 years ago. Her smoking use included Cigarettes. She has a 7 pack-year smoking history. She has never used smokeless tobacco. She reports that she does not drink alcohol or use illicit drugs.  Allergies: No Known Allergies  Medications Prior to Admission  Medication Sig Dispense Refill  . gabapentin (NEURONTIN) 300 MG capsule Take 600 mg by mouth 5 (five) times daily.      . hydrochlorothiazide 25 MG tablet Take 25 mg by mouth daily.       [name]  HYDROcodone-acetaminophen (LORTAB) 10-500 MG per tablet Take 1 tablet by mouth 5 (five) times daily.        No results found for this or any previous visit (from the past 48 hour(s)). No results found.  Review of Systems  Constitutional: Negative.   HENT: Negative.   Eyes: Negative.   Respiratory: Negative.   Cardiovascular: Negative.   Gastrointestinal: Negative.   Genitourinary: Negative.   Skin: Negative.   Neurological: Negative.   Endo/Heme/Allergies: Negative.   Psychiatric/Behavioral: Negative.     Blood pressure 148/84, pulse 91, temperature 98.5 F (36.9 C), temperature source Oral, resp. rate 18, SpO2 97.00%. Physical Exam Patient presents for R CTR .Marland KitchenThe patient is alert and oriented in no acute distress the patient complains of pain in the affected upper extremity. The patient is noted to have a normal HEENT exam. Lung fields show equal chest expansion and no shortness of breath abdomen exam is nontender without distention. Lower extremity examination does not show any fracture dislocation or blood clot symptoms. Pelvis is stable neck and back are stable and nontender  Assessment/Plan .Marland KitchenWe are planning surgery for your upper extremity. The risk and benefits of surgery include risk of bleeding infection anesthesia damage to normal structures and failure of the surgery to accomplish its intended goals of relieving symptoms and restoring  function with this in mind we'll going to proceed. I have specifically discussed with the patient the pre-and postoperative regime and the does and don'ts and risk and benefits in great detail. Risk and benefits of surgery also include risk of dystrophy chronic nerve pain failure of the healing process to go onto completion and other inherent risks of surgery The relavent the pathophysiology of the disease/injury process, as well as the alternatives for treatment and postoperative course of action has been discussed in great detail with the  patient who desires to proceed.  We will do everything in our power to help you (the patient) restore function to the upper extremity. Is a pleasure to see this patient today.   Karen Chafe 06/30/2012, 2:25 PM

## 2012-06-30 NOTE — Anesthesia Postprocedure Evaluation (Signed)
Anesthesia Post Note  Patient: Amy Villa  Procedure(s) Performed: Procedure(s) (LRB): CARPAL TUNNEL RELEASE (Right)  Anesthesia type: General  Patient location: PACU  Post pain: Pain level controlled and Adequate analgesia  Post assessment: Post-op Vital signs reviewed, Patient's Cardiovascular Status Stable, Respiratory Function Stable, Patent Airway and Pain level controlled  Last Vitals:  Filed Vitals:   06/30/12 1545  BP: 134/85  Pulse: 78  Temp:   Resp: 20    Post vital signs: Reviewed and stable  Level of consciousness: awake, alert  and oriented  Complications: No apparent anesthesia complications

## 2012-06-30 NOTE — Anesthesia Preprocedure Evaluation (Signed)
Anesthesia Evaluation  Patient identified by MRN, date of birth, ID band Patient awake    Reviewed: Allergy & Precautions, H&P , NPO status , Patient's Chart, lab work & pertinent test results  Airway Mallampati: II  Neck ROM: full    Dental   Pulmonary former smoker,          Cardiovascular hypertension,     Neuro/Psych  Neuromuscular disease    GI/Hepatic   Endo/Other    Renal/GU      Musculoskeletal  (+) Arthritis -,   Abdominal   Peds  Hematology   Anesthesia Other Findings   Reproductive/Obstetrics                           Anesthesia Physical Anesthesia Plan  ASA: II  Anesthesia Plan: MAC   Post-op Pain Management:    Induction: Intravenous  Airway Management Planned: Simple Face Mask  Additional Equipment:   Intra-op Plan:   Post-operative Plan:   Informed Consent: I have reviewed the patients History and Physical, chart, labs and discussed the procedure including the risks, benefits and alternatives for the proposed anesthesia with the patient or authorized representative who has indicated his/her understanding and acceptance.     Plan Discussed with: CRNA and Surgeon  Anesthesia Plan Comments:         Anesthesia Quick Evaluation

## 2012-06-30 NOTE — Preoperative (Signed)
Beta Blockers   Reason not to administer Beta Blockers:Not Applicable 

## 2012-06-30 NOTE — Op Note (Signed)
See Dictation #[id number] Dominica Severin MD

## 2012-06-30 NOTE — Transfer of Care (Signed)
Immediate Anesthesia Transfer of Care Note  Patient: Amy Villa  Procedure(s) Performed: Procedure(s) (LRB) with comments: CARPAL TUNNEL RELEASE (Right) - RIGHT LIMITED OPEN CARPAL TUNNEL RELEASE  Patient Location: PACU  Anesthesia Type:General  Level of Consciousness: awake, alert , oriented and patient cooperative  Airway & Oxygen Therapy: Patient Spontanous Breathing and Patient connected to nasal cannula oxygen  Post-op Assessment: Report given to PACU RN, Post -op Vital signs reviewed and stable and Patient moving all extremities  Post vital signs: Reviewed and stable  Complications: No apparent anesthesia complications

## 2012-06-30 NOTE — Anesthesia Procedure Notes (Signed)
Procedure Name: LMA Insertion Date/Time: 06/30/2012 2:54 PM Performed by: Darcey Nora B Pre-anesthesia Checklist: Patient identified, Emergency Drugs available, Suction available and Patient being monitored Patient Re-evaluated:Patient Re-evaluated prior to inductionOxygen Delivery Method: Circle system utilized Preoxygenation: Pre-oxygenation with 100% oxygen Intubation Type: IV induction Ventilation: Mask ventilation without difficulty LMA Size: 4.0 Tube type: Oral Number of attempts: 1 ETT to lip (cm): taped to cheeks; gauze roll b/t teeth. Tube secured with: Tape Dental Injury: Teeth and Oropharynx as per pre-operative assessment

## 2012-07-01 ENCOUNTER — Encounter (HOSPITAL_COMMUNITY): Payer: Self-pay | Admitting: Orthopedic Surgery

## 2012-07-01 NOTE — Op Note (Signed)
Amy Villa, Amy Villa NO.:  [id number]  MEDICAL RECORD NO.:  [id number]  LOCATION:  [facility]                         FACILITY:  [facility]  PHYSICIAN:  Dionne Ano. Eliberto Sole, M.D.DATE OF BIRTH:  [birth year]  DATE OF PROCEDURE:  06/30/2012 DATE OF DISCHARGE:                              OPERATIVE REPORT   PREOPERATIVE DIAGNOSIS:  Right carpal tunnel syndrome.  POSTOPERATIVE DIAGNOSIS:  Right carpal tunnel syndrome.  PROCEDURE:  Right open carpal tunnel release.  SURGEON:  Dionne Ano. Amanda Pea, M.D.  ASSISTANT:  Karie Chimera, PA-C  COMPLICATIONS:  None.  ANESTHESIA:  General.  TOURNIQUET TIME:  Less than 10 minutes.  INDICATIONS FOR THE PROCEDURE:  A pleasant female who has had a left carpal tunnel release performed.  She desires to proceed with right limb open carpal tunnel release, understanding and accepting the risks and benefits of surgery.  OPERATIVE PROCEDURE:  The patient was seen by myself and anesthesia, taken to operative suite, underwent smooth induction general anesthesia at her preference.  Time-out was called.  Pre and postop checklist complete.  Right upper extremity was prepped and draped in usual sterile fashion with Betadine scrub and paint.  Following this, 1 cm incision was made distal to transcarpal ligament.  Dissection was carried down, palmar fascia was incised, distal edge released under 4.0 loupe magnification.  Fat pad egressed nicely.  Distal to proximal dissection was then carried out intact and stable for sliding blunt tip rhizotomy scissors to the remaining leaflet and into portions of the antebrachial fascia.  The patient tolerated this quite well.  There were no complicating features.  Once this done, I then inspected the canal.  The median nerve was released.  It was noted that she was excellently decompressed.  Hemostasis was secured with tourniquet deflated and the wound closed with 3 horizontal mattress sutures.  An 8 mL of  Sensorcaine without epinephrine was placed as postop analgesia.  She tolerated the procedure well.  Elevate, move, massage fingers, and see [place] back in 7 days.  Therapy in 12-14 days.  I will notify should any problems occur.  She will resume her regular pain management routine of Vicodin 10 mg tablets.  These notes discussed and all questions have been encouraged and answered.     Dionne Ano. Amanda Pea, M.D.     [facility]  D:  06/30/2012  T:  07/01/2012  Job:  [id number]

## 2012-07-20 HISTORY — PX: COLONOSCOPY: SHX174

## 2012-09-07 DIAGNOSIS — F411 Generalized anxiety disorder: Secondary | ICD-10-CM | POA: Insufficient documentation

## 2012-09-29 ENCOUNTER — Telehealth: Payer: Self-pay | Admitting: Internal Medicine

## 2012-09-29 NOTE — Telephone Encounter (Signed)
Left message for patient to call back  

## 2012-09-29 NOTE — Telephone Encounter (Signed)
Patient is scheduled to see Mike Gip PA on 10/03/12 1:30.  Records here and given to [facility]

## 2012-10-03 ENCOUNTER — Ambulatory Visit: Payer: BC Managed Care – PPO | Admitting: Physician Assistant

## 2012-10-03 ENCOUNTER — Encounter: Payer: Self-pay | Admitting: *Deleted

## 2012-10-05 ENCOUNTER — Encounter: Payer: Self-pay | Admitting: Internal Medicine

## 2012-10-05 ENCOUNTER — Ambulatory Visit (INDEPENDENT_AMBULATORY_CARE_PROVIDER_SITE_OTHER): Payer: BC Managed Care – PPO | Admitting: Physician Assistant

## 2012-10-05 ENCOUNTER — Encounter: Payer: Self-pay | Admitting: Physician Assistant

## 2012-10-05 VITALS — BP 130/76 | HR 80 | Ht 63.0 in | Wt 115.0 lb

## 2012-10-05 DIAGNOSIS — R1032 Left lower quadrant pain: Secondary | ICD-10-CM

## 2012-10-05 DIAGNOSIS — R1031 Right lower quadrant pain: Secondary | ICD-10-CM

## 2012-10-05 DIAGNOSIS — K625 Hemorrhage of anus and rectum: Secondary | ICD-10-CM

## 2012-10-05 MED ORDER — MOVIPREP 100 G PO SOLR: 1.0000 | Freq: Once | ORAL | Status: AC

## 2012-10-05 NOTE — Progress Notes (Signed)
Agree with Ms. Esterwood's assessment and plan. Carl E. Gessner, MD, FACG   

## 2012-10-05 NOTE — Patient Instructions (Addendum)
Please go to the basement level to have your labs drawn.  We have given you a free voucher for the Moviprep, colonoscopy prep. We sent a prescription to the pharmacy. [facility] , [place].  You have been scheduled for a colonoscopy with propofol. Please follow written instructions given to you at your visit today.  Please pick up your prep kit at the pharmacy within the next 1-3 days. If you use inhalers (even only as needed), please bring them with you on the day of your procedure.

## 2012-10-05 NOTE — Progress Notes (Signed)
Subjective:    Patient ID: Amy Villa, female    DOB: 06/03/1966, 47 y.o.   MRN: [id number]  HPI Amy Villa is a 47 year old white female known to Dr. Leone Payor. She was last seen in 2012 with alcoholic liver disease areas she had an admission in 2010 with alcoholic hepatitis. In 2012- CT had shown hepatomegaly and fatty liver -not clear that she had cirrhosis but she did have a low platelet count raising the suspicion. She comes in now with new complaint of rectal bleeding. She has not had prior colonoscopy. He says he had an episode on 09/03/2012 with severe lower abdominal pain and cramping which he says double her over was followed by bowel movement with blood noted in the stool in the toilet and also on the tissue. She says she just had one bowel movement that contained blood and then her symptoms eased off.  She says she did feel  it somewhat into her lower back. She  says now over the past month she has had some recurrent lower abdominal crampy discomfort  - bowel movements have been fairly normal for her she does get occasional urgency after eating and diarrhea. She had another episode of noting blood mixed in with the bowel movement on March 5 says there was more blood than she had noted the first time. Again the following morning she saw small amount of blood in and hasn't seen any since. She continues to have the lower abdominal  cramping. Her weight is down  4 pounds over the past month, but she says her appetite is pretty good she has not had any nausea or vomiting, no fever or chills. She is status post partial hysterectomy not had any other abdominal surgery . No new meds or antibiotics Family history is positive for polyps in her sister.    Review of Systems  Constitutional: Negative.   HENT: Negative.   Eyes: Negative.   Respiratory: Negative.   Cardiovascular: Negative.   Gastrointestinal: Positive for abdominal pain, diarrhea and blood in stool.  Endocrine: Negative.    Genitourinary: Negative.   Allergic/Immunologic: Negative.   Neurological: Negative.   Hematological: Negative.   Psychiatric/Behavioral: Negative.    Outpatient Prescriptions Prior to Visit  Medication Sig Dispense Refill  . fluticasone (FLONASE) 50 MCG/ACT nasal spray Place 2 sprays into the nose daily.      [name] gabapentin (NEURONTIN) 300 MG capsule Take 600 mg by mouth 5 (five) times daily.      . hydrochlorothiazide 25 MG tablet Take 25 mg by mouth daily.       [name] loratadine-pseudoephedrine (CLARITIN-D 24-HOUR) 10-240 MG per 24 hr tablet Take 1 tablet by mouth daily.      . cloNIDine (CATAPRES) 0.1 MG tablet Take 0.1 mg by mouth 3 (three) times daily.      [name] HYDROcodone-acetaminophen (LORTAB 10) 10-500 MG per tablet Take 1 tablet by mouth every 4 (four) hours as needed for pain.  30 tablet  0  . HYDROcodone-acetaminophen (LORTAB) 10-500 MG per tablet Take 1 tablet by mouth 5 (five) times daily.       No facility-administered medications prior to visit.   No Known Allergies Patient Active Problem List  Diagnosis  . ALCOHOLISM  . ACUTE ALCOHOLIC HEPATITIS  . PANCREATITIS  . RUQ PAIN  . NONSPECIFIC ABN FINDNG RAD&OTH EXAM BILARY TRCT   History  Substance Use Topics  . Smoking status: Former Smoker -- 1.00 packs/day for 7 years    Types: Cigarettes  Quit date: 06/24/2010  . Smokeless tobacco: Never Used     Comment: quit smoking 1-2 yrs. ago  . Alcohol Use: 0.0 oz/week   family history includes Colon cancer in an unspecified family member; Colon polyps in her sister; Diabetes in her mother; and Lung cancer in an unspecified family member.     Objective:   Physical Exam  White female in no acute distress, accompanied by her husband blood pressure 130/76 pulse 80 height 5 foot 3 weight 1:15. HEENT; nontraumatic normocephalic EOMI PERRLA sclera anicteric,Neck; Supple no JVD, Cardiovascular; regular rate and rhythm with S1-S2 no murmur or gallop, Pulmonary; clear bilaterally,  Abdomen;  she is mildly tender bilaterally in the lower quadrants and in the suprapubic area there is no guarding or rebound no palpable mass or hepatosplenomegaly bowel sounds are active, Rectal; exam tiny external hemorrhoidal tags no internal lesion palpated stool is brown and currently Hemoccult negative, Extremities; no clubbing cyanosis or edema, she is tanned, Psych; mood and affect normal and appropriate       Assessment & Plan:  #58  47 year old female with 4-5 week history of lower Donald discomfort and cramping and 2 distinct episodes of bloody bowel movement. Etiology of her symptoms is not clear we'll need to rule out a new onset of colitis, versus occult lesion. #2 history of EtOH abuse-she states currently inactive #3 history of EtOH hepatitis 2010 #4 chronic cervical arthritis  Plan; will schedule for colonoscopy with Dr. Benjamine Mola was discussed in detail with the patient and her husband and they are agreeable to proceed. She is anxious to have this done as soon as possible Check CBC with differential and CRP. Plans pending results of above.

## 2012-10-06 ENCOUNTER — Ambulatory Visit (AMBULATORY_SURGERY_CENTER): Payer: BC Managed Care – PPO | Admitting: Internal Medicine

## 2012-10-06 ENCOUNTER — Encounter: Payer: Self-pay | Admitting: Internal Medicine

## 2012-10-06 VITALS — BP 113/87 | HR 69 | Temp 97.5°F | Resp 13 | Ht 63.0 in | Wt 115.0 lb

## 2012-10-06 DIAGNOSIS — D129 Benign neoplasm of anus and anal canal: Secondary | ICD-10-CM

## 2012-10-06 DIAGNOSIS — D128 Benign neoplasm of rectum: Secondary | ICD-10-CM

## 2012-10-06 DIAGNOSIS — D126 Benign neoplasm of colon, unspecified: Secondary | ICD-10-CM

## 2012-10-06 DIAGNOSIS — R1031 Right lower quadrant pain: Secondary | ICD-10-CM

## 2012-10-06 DIAGNOSIS — K625 Hemorrhage of anus and rectum: Secondary | ICD-10-CM

## 2012-10-06 DIAGNOSIS — K644 Residual hemorrhoidal skin tags: Secondary | ICD-10-CM

## 2012-10-06 DIAGNOSIS — K648 Other hemorrhoids: Secondary | ICD-10-CM

## 2012-10-06 DIAGNOSIS — R1032 Left lower quadrant pain: Secondary | ICD-10-CM

## 2012-10-06 MED ORDER — SODIUM CHLORIDE 0.9 % IV SOLN: 500.0000 mL | INTRAVENOUS | Status: DC

## 2012-10-06 NOTE — Op Note (Signed)
[facility] [address] [place], [address]   COLONOSCOPY PROCEDURE REPORT  PATIENT: Amy Villa, Amy Villa  MR#: [id number] BIRTHDATE: 22-Aug-1965 , 46  yrs. old GENDER: Female ENDOSCOPIST: Iva Boop, MD, [facility] PROCEDURE DATE:  10/06/2012 PROCEDURE:   Colonoscopy with biopsy ASA CLASS:   Class II INDICATIONS:Rectal Bleeding. MEDICATIONS: propofol (Diprivan) 300mg  IV, MAC sedation, administered by CRNA, and These medications were titrated to patient response per physician's verbal order  DESCRIPTION OF PROCEDURE:   After the risks benefits and alternatives of the procedure were thoroughly explained, informed consent was obtained.  A digital rectal exam revealed no rectal mass.   The LB CF-[id number]  endoscope was introduced through the anus and advanced to the terminal ileum which was intubated for a short distance. No adverse events experienced.   The quality of the prep was Suprep good  The instrument was then slowly withdrawn as the colon was fully examined.      COLON FINDINGS: Two polypoid shaped sessile polyps measuring 1-2 mm in size were found in the rectum.  A polypectomy was performed with cold forceps.  The resection was complete and the polyp tissue was completely retrieved.   Small internal and external hemorrhoids were found.  ? Abnormal mucosa was found in the distal  rectum. The mucosa had granularity.  Multiple biopsies were performed using cold forceps.   The colon mucosa was otherwise normal.   The mucosa appeared normal in the terminal ileum.  Retroflexed views revealed internal/external hemorrhoids. The time to cecum=2 minutes 15 seconds.  Withdrawal time=10 minutes 27 seconds.  The scope was withdrawn and the procedure completed. COMPLICATIONS: There were no complications.  ENDOSCOPIC IMPRESSION: 1.   Two sessile polyps measuring 1-2 mm in size were found in the rectum; polypectomy was performed with cold forceps 2.   Small  internal and external hemorrhoids 3.   ? Abnormal mucosa was found in the rectum; multiple biopsies were performed using cold forceps 4.   The colon mucosa was otherwise normal - good prep 5.   Normal mucosa in the terminal ileum  RECOMMENDATIONS: Office will call with the results. Cancel CBC/CRP ordered yesterday (office notified).   eSigned:  Iva Boop, MD, [facility] 10/06/2012 9:25 AM cc: Herb Grays, MD and The Patient

## 2012-10-06 NOTE — Progress Notes (Signed)
Patient stating she had a sip of her husband's Mt. Dew at 0745. [place] at 0645. Informed Dr. Leone Payor and Brennan Bailey CRNA, Mr. Katrinka Blazing clearing for procedure at 0900.

## 2012-10-06 NOTE — Progress Notes (Signed)
Lidocaine-40mg IV prior to Propofol InductionPropofol given over incremental dosages 

## 2012-10-06 NOTE — Patient Instructions (Addendum)
Two tiny polyps were removed. There were some changes in the lining of the rectum - probably not a problem but I took biopsies. You also have small hemorrhoids - the cause of bleeding.  We had ordered labs for you but they have not been done - I am going to cancel them at this point.  I will let you know results and plans next week.  Thank you for choosing me and [facility].  Iva Boop, MD, FACG YOU HAD AN ENDOSCOPIC PROCEDURE TODAY AT THE [facility]: Refer to the procedure report that was given to you for any specific questions about what was found during the examination.  If the procedure report does not answer your questions, please call your gastroenterologist to clarify.  If you requested that your care partner not be given the details of your procedure findings, then the procedure report has been included in a sealed envelope for you to review at your convenience later.  YOU SHOULD EXPECT: Some feelings of bloating in the abdomen. Passage of more gas than usual.  Walking can help get rid of the air that was put into your GI tract during the procedure and reduce the bloating. If you had a lower endoscopy (such as a colonoscopy or flexible sigmoidoscopy) you may notice spotting of blood in your stool or on the toilet paper. If you underwent a bowel prep for your procedure, then you may not have a normal bowel movement for a few days.  DIET: Your first meal following the procedure should be a light meal and then it is ok to progress to your normal diet.  A half-sandwich or bowl of soup is an example of a good first meal.  Heavy or fried foods are harder to digest and may make you feel nauseous or bloated.  Likewise meals heavy in dairy and vegetables can cause extra gas to form and this can also increase the bloating.  Drink plenty of fluids but you should avoid alcoholic beverages for 24 hours.  ACTIVITY: Your care partner should take you home directly after the  procedure.  You should plan to take it easy, moving slowly for the rest of the day.  You can resume normal activity the day after the procedure however you should NOT DRIVE or use heavy machinery for 24 hours (because of the sedation medicines used during the test).    SYMPTOMS TO REPORT IMMEDIATELY: A gastroenterologist can be reached at any hour.  During normal business hours, 8:30 AM to 5:00 PM Monday through Friday, call [phone].  After hours and on weekends, please call the GI answering service at [phone] who will take a message and have the physician on call contact you.   Following lower endoscopy (colonoscopy or flexible sigmoidoscopy):  Excessive amounts of blood in the stool  Significant tenderness or worsening of abdominal pains  Swelling of the abdomen that is new, acute  Fever of 100F or higher  Following upper endoscopy (EGD)  Vomiting of blood or coffee ground material  New chest pain or pain under the shoulder blades  Painful or persistently difficult swallowing  New shortness of breath  Fever of 100F or higher  Black, tarry-looking stools  FOLLOW UP: If any biopsies were taken you will be contacted by phone or by letter within the next 1-3 weeks.  Call your gastroenterologist if you have not heard about the biopsies in 3 weeks.  Our staff will call the home number listed on your  records the next business day following your procedure to check on you and address any questions or concerns that you may have at that time regarding the information given to you following your procedure. This is a courtesy call and so if there is no answer at the home number and we have not heard from you through the emergency physician on call, we will assume that you have returned to your regular daily activities without incident.  SIGNATURES/CONFIDENTIALITY: You and/or your care partner have signed paperwork which will be entered into your electronic medical record.  These  signatures attest to the fact that that the information above on your After Visit Summary has been reviewed and is understood.  Full responsibility of the confidentiality of this discharge information lies with you and/or your care-partner.

## 2012-10-06 NOTE — Progress Notes (Signed)
Patient did not experience any of the following events: a burn prior to discharge; a fall within the facility; wrong site/side/patient/procedure/implant event; or a hospital transfer or hospital admission upon discharge from the facility. (G8907) Patient did not have preoperative order for IV antibiotic SSI prophylaxis. (G8918)  

## 2012-10-07 ENCOUNTER — Telehealth: Payer: Self-pay | Admitting: *Deleted

## 2012-10-07 NOTE — Telephone Encounter (Signed)
No answer left message to call if questions or concerns. 

## 2012-10-12 NOTE — Progress Notes (Signed)
Quick Note:  The biopsies were ok - no problems - polyps were benign and not precancerous and no problems on other rectal biopsies  If she is doing ok now see me as needed, if not let me know  [facility] - place 10 year colon recall - no letter ______

## 2012-11-19 ENCOUNTER — Emergency Department (HOSPITAL_COMMUNITY)
Admission: EM | Admit: 2012-11-19 | Discharge: 2012-11-20 | Disposition: A | Discharge status: Home / Self Care | Payer: BC Managed Care – PPO | Attending: Emergency Medicine | Admitting: Emergency Medicine

## 2012-11-19 ENCOUNTER — Encounter (HOSPITAL_COMMUNITY): Payer: Self-pay

## 2012-11-19 DIAGNOSIS — T428X1A Poisoning by antiparkinsonism drugs and other central muscle-tone depressants, accidental (unintentional), initial encounter: Secondary | ICD-10-CM | POA: Insufficient documentation

## 2012-11-19 DIAGNOSIS — Z8669 Personal history of other diseases of the nervous system and sense organs: Secondary | ICD-10-CM | POA: Insufficient documentation

## 2012-11-19 DIAGNOSIS — Z98811 Dental restoration status: Secondary | ICD-10-CM | POA: Insufficient documentation

## 2012-11-19 DIAGNOSIS — Z87891 Personal history of nicotine dependence: Secondary | ICD-10-CM | POA: Insufficient documentation

## 2012-11-19 DIAGNOSIS — I10 Essential (primary) hypertension: Secondary | ICD-10-CM | POA: Insufficient documentation

## 2012-11-19 DIAGNOSIS — T481X4A Poisoning by skeletal muscle relaxants [neuromuscular blocking agents], undetermined, initial encounter: Secondary | ICD-10-CM | POA: Insufficient documentation

## 2012-11-19 DIAGNOSIS — Z862 Personal history of diseases of the blood and blood-forming organs and certain disorders involving the immune mechanism: Secondary | ICD-10-CM | POA: Insufficient documentation

## 2012-11-19 DIAGNOSIS — Z79899 Other long term (current) drug therapy: Secondary | ICD-10-CM | POA: Insufficient documentation

## 2012-11-19 DIAGNOSIS — M542 Cervicalgia: Secondary | ICD-10-CM | POA: Insufficient documentation

## 2012-11-19 DIAGNOSIS — T465X1A Poisoning by other antihypertensive drugs, accidental (unintentional), initial encounter: Secondary | ICD-10-CM | POA: Insufficient documentation

## 2012-11-19 DIAGNOSIS — IMO0002 Reserved for concepts with insufficient information to code with codable children: Secondary | ICD-10-CM | POA: Insufficient documentation

## 2012-11-19 DIAGNOSIS — R209 Unspecified disturbances of skin sensation: Secondary | ICD-10-CM | POA: Insufficient documentation

## 2012-11-19 DIAGNOSIS — T50992A Poisoning by other drugs, medicaments and biological substances, intentional self-harm, initial encounter: Secondary | ICD-10-CM | POA: Insufficient documentation

## 2012-11-19 DIAGNOSIS — Z8614 Personal history of Methicillin resistant Staphylococcus aureus infection: Secondary | ICD-10-CM | POA: Insufficient documentation

## 2012-11-19 DIAGNOSIS — Z8639 Personal history of other endocrine, nutritional and metabolic disease: Secondary | ICD-10-CM | POA: Insufficient documentation

## 2012-11-19 DIAGNOSIS — M47812 Spondylosis without myelopathy or radiculopathy, cervical region: Secondary | ICD-10-CM | POA: Insufficient documentation

## 2012-11-19 DIAGNOSIS — G8929 Other chronic pain: Secondary | ICD-10-CM | POA: Insufficient documentation

## 2012-11-19 LAB — COMPREHENSIVE METABOLIC PANEL
ALT: 26 U/L (ref 0–35)
AST: 35 U/L (ref 0–37)
CO2: 23 mEq/L (ref 19–32)
Calcium: 9.1 mg/dL (ref 8.4–10.5)
Chloride: 95 mEq/L — ABNORMAL LOW (ref 96–112)
Creatinine, Ser: 0.6 mg/dL (ref 0.50–1.10)
GFR calc Af Amer: >90 mL/min (ref >90)
GFR calc non Af Amer: >90 mL/min (ref >90)
Glucose, Bld: 133 mg/dL — ABNORMAL HIGH (ref 70–99)
Sodium: 134 mEq/L — ABNORMAL LOW (ref 135–145)
Total Bilirubin: 0.4 mg/dL (ref 0.3–1.2)

## 2012-11-19 LAB — CBC WITH DIFFERENTIAL/PLATELET
Basophils Absolute: 0 10*3/uL (ref 0.0–0.1)
Eosinophils Relative: 1 % (ref 0–5)
HCT: 40.2 % (ref 36.0–46.0)
Hemoglobin: 14.2 g/dL (ref 12.0–15.0)
Lymphocytes Relative: 20 % (ref 12–46)
Lymphs Abs: 2.2 10*3/uL (ref 0.7–4.0)
MCV: 95.5 fL (ref 78.0–100.0)
Monocytes Absolute: 0.5 10*3/uL (ref 0.1–1.0)
Neutro Abs: 8.4 10*3/uL — ABNORMAL HIGH (ref 1.7–7.7)
RBC: 4.21 MIL/uL (ref 3.87–5.11)
RDW: 12.4 % (ref 11.5–15.5)
WBC: 11.3 10*3/uL — ABNORMAL HIGH (ref 4.0–10.5)

## 2012-11-19 LAB — RAPID URINE DRUG SCREEN, HOSP PERFORMED: Barbiturates: NOT DETECTED

## 2012-11-19 LAB — SALICYLATE LEVEL: Salicylate Lvl: <2 mg/dL — ABNORMAL LOW (ref 2.8–20.0)

## 2012-11-19 LAB — ETHANOL: Alcohol, Ethyl (B): 180 mg/dL — ABNORMAL HIGH (ref 0–11)

## 2012-11-19 LAB — GLUCOSE, CAPILLARY: Glucose-Capillary: 138 mg/dL — ABNORMAL HIGH (ref 70–99)

## 2012-11-19 MED ORDER — ACETAMINOPHEN 325 MG PO TABS: 650.0000 mg | ORAL_TABLET | ORAL | Status: DC | PRN

## 2012-11-19 MED ORDER — IBUPROFEN 200 MG PO TABS: 600.0000 mg | ORAL_TABLET | Freq: Three times a day (TID) | ORAL | Status: DC | PRN

## 2012-11-19 MED ORDER — NICOTINE 21 MG/24HR TD PT24: 21.0000 mg | MEDICATED_PATCH | Freq: Every day | TRANSDERMAL | Status: DC

## 2012-11-19 MED ORDER — ONDANSETRON HCL 4 MG PO TABS: 4.0000 mg | ORAL_TABLET | Freq: Three times a day (TID) | ORAL | Status: DC | PRN

## 2012-11-19 MED ORDER — LORAZEPAM 1 MG PO TABS: 1.0000 mg | ORAL_TABLET | Freq: Three times a day (TID) | ORAL | Status: DC | PRN

## 2012-11-19 MED ORDER — ZOLPIDEM TARTRATE 5 MG PO TABS: 5.0000 mg | ORAL_TABLET | Freq: Every evening | ORAL | Status: DC | PRN

## 2012-11-19 NOTE — ED Notes (Signed)
Poison Control notified, spoke with Archie Patten, Observe for CNS and  Resp Depression. No Charcoal, Monitor, EKG, Seizures Precautions, Possible urinary retention, If bp drops consider Dopamine, Tylenol level post 4 hours, Labs, Re evaluate  6 hours, if symptomatic observe 24 hours.

## 2012-11-19 NOTE — ED Notes (Signed)
Pt is aware of the need for a urine sample. PT unable to void at this time.

## 2012-11-19 NOTE — ED Notes (Addendum)
Pt is denying SI.  Sts she took the medication, "because I can't take the withdrawal and I didn't have enough medication to get me through to Monday.  I get my refill on Monday.  I called my doctor, but they wouldn't give me a refill early."  Pt sts she ran out of her pain medication yesterday.  Takes pain medication x 5 times a day for chronic neck pain.

## 2012-11-19 NOTE — ED Notes (Signed)
Medication logged and sent to pharmacy

## 2012-11-19 NOTE — ED Notes (Signed)
[id number]<AV> Expected date:<BR> Expected time:<BR> Means of arrival:<BR> Comments:<BR> Pt still in room

## 2012-11-19 NOTE — ED Provider Notes (Addendum)
History     CSN: [id number]  Arrival date & time 11/19/12  1630   First MD Initiated Contact with Patient 11/19/12 1648      Chief Complaint  Patient presents with  . Ingestion    (Consider location/radiation/quality/duration/timing/severity/associated sxs/prior treatment) Patient is a 47 y.o. female presenting with Ingested Medication. The history is provided by the patient.  Ingestion This is a new problem. The current episode started 1 to 2 hours ago. The problem occurs constantly. The problem has not changed since onset.Pertinent negatives include no abdominal pain and no shortness of breath. Associated symptoms comments: Pt states that she suffers from chronic neck pain and ran out of her opiates and can't fill the new ones till Monday.  She states that she started to have opiate withdrawal yesterday and today just couldn't take it.  States that she was afraid she would kill herself and took a handful of pills including what she thinks is 12-15 0.1 clonidine pills, 4 flexeril and 1 robaxin.  Pt states she took the meds around 3pm.. Nothing aggravates the symptoms. Nothing relieves the symptoms. She has tried nothing for the symptoms. The treatment provided no relief.    Past Medical History  Diagnosis Date  . Arthritis     neck  . Carpal tunnel syndrome on both sides 02/2012  . Wears partial dentures     lower  . Dental crowns present     x 2 upper front  . History of MRSA infection   . Fluid retention   . Hypertension     Past Surgical History  Procedure Laterality Date  . Laparoscopic vaginal hysterectomy  02/24/2010  . Leep      x 2  . Laparoscopy  06/2010    for adhesions  . Lumbar disc surgery  03/23/2003    left L5-S1 discectomy with microdissection  . Lumbar disc surgery  05/23/2003    redo discectomy L5-S1 with microdissection  . Carpal tunnel release  03/17/2012    Procedure: CARPAL TUNNEL RELEASE;  Surgeon: Dominica Severin, MD;  Location: [facility];   Service: Orthopedics;  Laterality: Bilateral;  left limited open carpal tunnel release. right carpal tunnel injection with 1cc of celestone and 2cc of marcaine.25%  . Carpal tunnel release  06/30/2012    Procedure: CARPAL TUNNEL RELEASE;  Surgeon: Dominica Severin, MD;  Location[facility];  Service: Orthopedics;  Laterality: Right;  RIGHT LIMITED OPEN CARPAL TUNNEL RELEASE    Family History  Problem Relation Age of Onset  . Diabetes Mother   . Lung cancer    . Colon cancer      neg. family history  . Colon polyps Sister     History  Substance Use Topics  . Smoking status: Former Smoker -- 1.00 packs/day for 7 years    Types: Cigarettes    Quit date: 06/24/2010  . Smokeless tobacco: Never Used     Comment: quit smoking 1-2 yrs. ago  . Alcohol Use: 0.0 oz/week    OB History   Grav Para Term Preterm Abortions TAB SAB Ect Mult Living                  Review of Systems  HENT: Positive for neck pain.   Respiratory: Negative for cough and shortness of breath.   Gastrointestinal: Negative for nausea, vomiting, abdominal pain and diarrhea.  Neurological: Positive for numbness. Negative for weakness and light-headedness.  Psychiatric/Behavioral: Positive for suicidal ideas.  All other systems reviewed and  are negative.    Allergies  Review of patient's allergies indicates no known allergies.  Home Medications   Current Outpatient Rx  Name  Route  Sig  Dispense  Refill  . cyclobenzaprine (FLEXERIL) 10 MG tablet   Oral   Take 10 mg by mouth 3 (three) times daily as needed for muscle spasms.         . fluticasone (FLONASE) 50 MCG/ACT nasal spray   Nasal   Place 2 sprays into the nose daily.         [name] gabapentin (NEURONTIN) 300 MG capsule   Oral   Take 600 mg by mouth 5 (five) times daily.         . hydrochlorothiazide 25 MG tablet   Oral   Take 25 mg by mouth daily.          [name] HYDROcodone-acetaminophen (NORCO) 10-325 MG per tablet   Oral   Take 1 tablet by  mouth every 6 (six) hours as needed for pain.         [name] loratadine-pseudoephedrine (CLARITIN-D 24-HOUR) 10-240 MG per 24 hr tablet   Oral   Take 1 tablet by mouth daily.           BP 119/80  Pulse 75  Temp(Src) 98 F (36.7 C)  Resp 16  Ht 5\' 3"  (1.6 m)  Wt 115 lb (52.164 kg)  BMI 20.38 kg/m2  SpO2 97%  Physical Exam  Nursing note and vitals reviewed. Constitutional: She is oriented to person, place, and time. She appears well-developed and well-nourished. No distress.  Slightly sleepy on exam but wakes immediately when her name is called  HENT:  Head: Normocephalic and atraumatic.  Mouth/Throat: Oropharynx is clear and moist.  Eyes: Conjunctivae and EOM are normal. Pupils are equal, round, and reactive to light.  Neck: Normal range of motion. Neck supple.  Cardiovascular: Normal rate, regular rhythm and intact distal pulses.   No murmur heard. Pulmonary/Chest: Effort normal and breath sounds normal. No respiratory distress. She has no wheezes. She has no rales.  Abdominal: Soft. She exhibits no distension. There is no tenderness. There is no rebound and no guarding.  Musculoskeletal: Normal range of motion. She exhibits no edema and no tenderness.  Neurological: She is alert and oriented to person, place, and time.  Skin: Skin is warm and dry. No rash noted. No erythema.  Psychiatric: Her behavior is normal. She expresses suicidal ideation.  Becomes tearful when talks about taking extra meds    ED Course  Procedures (including critical care time)  Labs Reviewed  CBC WITH DIFFERENTIAL - Abnormal; Notable for the following:    WBC 11.3 (*)    Neutro Abs 8.4 (*)    All other components within normal limits  COMPREHENSIVE METABOLIC PANEL - Abnormal; Notable for the following:    Sodium 134 (*)    Potassium 3.2 (*)    Chloride 95 (*)    Glucose, Bld 133 (*)    All other components within normal limits  ETHANOL - Abnormal; Notable for the following:    Alcohol,  Ethyl (B) 180 (*)    All other components within normal limits  SALICYLATE LEVEL - Abnormal; Notable for the following:    Salicylate Lvl <2.0 (*)    All other components within normal limits  GLUCOSE, CAPILLARY - Abnormal; Notable for the following:    Glucose-Capillary 138 (*)    All other components within normal limits  URINE RAPID DRUG SCREEN (HOSP PERFORMED)  ACETAMINOPHEN LEVEL   No results found.   Date: 11/19/2012  Rate: 66  Rhythm: normal sinus rhythm  QRS Axis: normal  Intervals: normal  ST/T Wave abnormalities: normal  Conduction Disutrbances:none  Narrative Interpretation:   Old EKG Reviewed: unchanged    1. Overdose drug, initial encounter   2. Suicidal ideations       MDM   Patient with a history of chronic neck pain who is on Norco tens but states that she ran out and cannot get a new prescription filled so Monday.  She states she was so scared about going through opiate withdrawal that she could not take it and so today she took 12-15.1 mg clonidine tablets approximately 2 hours prior to arrival.  Patient states that she just thought she may kill herself because withdrawal is so terrible. She also admitted to taking maybe for 10 mg Flexeril tablet. Currently on exam patient is awake and alert and has normal pulse and blood pressure. EKG is unrevealing. CBC, CMP, UDS, EtOH, acetaminophen and salicylate levels are pending. Spoke withpoison control recommended monitoring, EKG, seizure precautions supportive care. He recommended monitoring for 6 hours and if patient is asymptomatic and she is cleared. However if she is displaying symptoms she will need observation for 24 hours.  When I saw the patient she is to hours out from ingestion and is currently asymptomatic.  10:25 PM Patient has been hemodynamically stable and is now cleared after 6 hours. Placed in the psychiatric unit and have evaluation by act and telepsych eval.   Gwyneth Sprout, MD 11/19/12  1610  Gwyneth Sprout, MD 11/19/12 [id number]  Gwyneth Sprout, MD 11/19/12 2327

## 2012-11-19 NOTE — ED Notes (Signed)
RUE:[id number]<JW> Expected date:11/19/12<BR> Expected time:<BR> Means of arrival:<BR> Comments:<BR> OD

## 2012-11-19 NOTE — ED Notes (Signed)
Telepsych called and faxed information

## 2012-11-19 NOTE — ED Notes (Signed)
Per EMS, Pt, from home, presents after taking .1mg  Clonidine x 15, 10mg  Flexeril x 4, and 500mg  Methocarbamol x 1.  Pt sts "I just wanted the pain to go away and sleep until Monday."  Vitals are stable.  Pt is A & Ox4.

## 2012-11-19 NOTE — ED Notes (Signed)
CBG registered 138 on ED Glucometer.

## 2012-11-20 MED ORDER — HYDROCODONE-ACETAMINOPHEN 10-325 MG PO TABS: 1.0000 | ORAL_TABLET | Freq: Four times a day (QID) | ORAL | Status: DC | PRN

## 2012-11-20 NOTE — ED Notes (Signed)
Pt became verbally abusive and escorted with security out of the department

## 2012-11-20 NOTE — ED Provider Notes (Signed)
Care assumed from Dr Anitra Lauth.  Pt with reported overdose of clonidine, flexeril, robaxin as she did not have enough vicodin to last her the weekend.  Pt medically clear, no signs of toxicity.  Telepsych, Dr Elpidio Galea, has cleared the patient from psych standpoint.  Will prescribe 5 vicodin for Sunday use as she is reported to be able to get refill on Monday.  Olivia Mackie, MD 11/20/12 [phone]

## 2013-02-10 ENCOUNTER — Emergency Department (HOSPITAL_COMMUNITY)
Admission: EM | Admit: 2013-02-10 | Discharge: 2013-02-11 | Disposition: A | Discharge status: Other Acute Inpatient Hospital | Payer: BC Managed Care – PPO | Attending: Emergency Medicine | Admitting: Emergency Medicine

## 2013-02-10 ENCOUNTER — Encounter (HOSPITAL_COMMUNITY): Payer: Self-pay

## 2013-02-10 DIAGNOSIS — F1010 Alcohol abuse, uncomplicated: Secondary | ICD-10-CM | POA: Insufficient documentation

## 2013-02-10 DIAGNOSIS — Z98811 Dental restoration status: Secondary | ICD-10-CM | POA: Insufficient documentation

## 2013-02-10 DIAGNOSIS — T426X2A Poisoning by other antiepileptic and sedative-hypnotic drugs, intentional self-harm, initial encounter: Secondary | ICD-10-CM | POA: Insufficient documentation

## 2013-02-10 DIAGNOSIS — R45851 Suicidal ideations: Secondary | ICD-10-CM

## 2013-02-10 DIAGNOSIS — F489 Nonpsychotic mental disorder, unspecified: Secondary | ICD-10-CM | POA: Insufficient documentation

## 2013-02-10 DIAGNOSIS — Z79899 Other long term (current) drug therapy: Secondary | ICD-10-CM | POA: Insufficient documentation

## 2013-02-10 DIAGNOSIS — I10 Essential (primary) hypertension: Secondary | ICD-10-CM | POA: Insufficient documentation

## 2013-02-10 DIAGNOSIS — F10921 Alcohol use, unspecified with intoxication delirium: Secondary | ICD-10-CM

## 2013-02-10 DIAGNOSIS — T4271XA Poisoning by unspecified antiepileptic and sedative-hypnotic drugs, accidental (unintentional), initial encounter: Secondary | ICD-10-CM | POA: Insufficient documentation

## 2013-02-10 DIAGNOSIS — Z8614 Personal history of Methicillin resistant Staphylococcus aureus infection: Secondary | ICD-10-CM | POA: Insufficient documentation

## 2013-02-10 DIAGNOSIS — T4272XA Poisoning by unspecified antiepileptic and sedative-hypnotic drugs, intentional self-harm, initial encounter: Secondary | ICD-10-CM | POA: Insufficient documentation

## 2013-02-10 DIAGNOSIS — Z9889 Other specified postprocedural states: Secondary | ICD-10-CM | POA: Insufficient documentation

## 2013-02-10 DIAGNOSIS — IMO0002 Reserved for concepts with insufficient information to code with codable children: Secondary | ICD-10-CM | POA: Insufficient documentation

## 2013-02-10 DIAGNOSIS — F332 Major depressive disorder, recurrent severe without psychotic features: Secondary | ICD-10-CM

## 2013-02-10 DIAGNOSIS — Z862 Personal history of diseases of the blood and blood-forming organs and certain disorders involving the immune mechanism: Secondary | ICD-10-CM | POA: Insufficient documentation

## 2013-02-10 DIAGNOSIS — Z8639 Personal history of other endocrine, nutritional and metabolic disease: Secondary | ICD-10-CM | POA: Insufficient documentation

## 2013-02-10 DIAGNOSIS — Z87891 Personal history of nicotine dependence: Secondary | ICD-10-CM | POA: Insufficient documentation

## 2013-02-10 DIAGNOSIS — Z8739 Personal history of other diseases of the musculoskeletal system and connective tissue: Secondary | ICD-10-CM | POA: Insufficient documentation

## 2013-02-10 DIAGNOSIS — T50902A Poisoning by unspecified drugs, medicaments and biological substances, intentional self-harm, initial encounter: Secondary | ICD-10-CM

## 2013-02-10 DIAGNOSIS — Z90710 Acquired absence of both cervix and uterus: Secondary | ICD-10-CM | POA: Insufficient documentation

## 2013-02-10 LAB — COMPREHENSIVE METABOLIC PANEL
AST: 41 U/L — ABNORMAL HIGH (ref 0–37)
BUN: 15 mg/dL (ref 6–23)
CO2: 30 mEq/L (ref 19–32)
Chloride: 97 mEq/L (ref 96–112)
Creatinine, Ser: 0.59 mg/dL (ref 0.50–1.10)
GFR calc non Af Amer: >90 mL/min (ref >90)
Total Bilirubin: 0.2 mg/dL — ABNORMAL LOW (ref 0.3–1.2)

## 2013-02-10 LAB — URINALYSIS, ROUTINE W REFLEX MICROSCOPIC
Glucose, UA: NEGATIVE mg/dL
Leukocytes, UA: NEGATIVE
pH: 7.5 (ref 5.0–8.0)

## 2013-02-10 LAB — ETHANOL
Alcohol, Ethyl (B): 182 mg/dL — ABNORMAL HIGH (ref 0–11)
Alcohol, Ethyl (B): 136 mg/dL — ABNORMAL HIGH (ref 0–11)

## 2013-02-10 LAB — CBC WITH DIFFERENTIAL/PLATELET
Basophils Absolute: 0.1 10*3/uL (ref 0.0–0.1)
HCT: 38.7 % (ref 36.0–46.0)
Hemoglobin: 13.4 g/dL (ref 12.0–15.0)
Lymphocytes Relative: 30 % (ref 12–46)
Monocytes Absolute: 0.6 10*3/uL (ref 0.1–1.0)
Monocytes Relative: 6 % (ref 3–12)
Neutro Abs: 6.5 10*3/uL (ref 1.7–7.7)
RBC: 3.97 MIL/uL (ref 3.87–5.11)
WBC: 10.5 10*3/uL (ref 4.0–10.5)

## 2013-02-10 LAB — SALICYLATE LEVEL: Salicylate Lvl: <2 mg/dL — ABNORMAL LOW (ref 2.8–20.0)

## 2013-02-10 LAB — RAPID URINE DRUG SCREEN, HOSP PERFORMED: Opiates: NOT DETECTED

## 2013-02-10 LAB — PROTIME-INR: INR: 1.16 (ref 0.00–1.49)

## 2013-02-10 MED ORDER — GABAPENTIN 300 MG PO CAPS
600.0000 mg | ORAL_CAPSULE | Freq: Every day | ORAL | Status: DC
  Administered 2013-02-10 – 2013-02-11 (×5): 600 mg via ORAL
  Filled 2013-02-10 (×7): qty 2

## 2013-02-10 MED ORDER — SODIUM CHLORIDE 0.9 % IV SOLN
INTRAVENOUS | Status: DC
  Administered 2013-02-10: 20:00:00 via INTRAVENOUS

## 2013-02-10 MED ORDER — LORATADINE 10 MG PO TABS
10.0000 mg | ORAL_TABLET | Freq: Every day | ORAL | Status: DC
  Administered 2013-02-11: 10 mg via ORAL
  Filled 2013-02-10: qty 1

## 2013-02-10 MED ORDER — CLONIDINE HCL 0.1 MG PO TABS
0.1000 mg | ORAL_TABLET | Freq: Three times a day (TID) | ORAL | Status: DC
  Administered 2013-02-10 – 2013-02-11 (×2): 0.1 mg via ORAL
  Filled 2013-02-10 (×2): qty 1

## 2013-02-10 MED ORDER — CYCLOBENZAPRINE HCL 10 MG PO TABS: 10.0000 mg | ORAL_TABLET | Freq: Three times a day (TID) | ORAL | Status: DC | PRN

## 2013-02-10 MED ORDER — ACETAMINOPHEN 325 MG PO TABS
650.0000 mg | ORAL_TABLET | ORAL | Status: DC | PRN
  Administered 2013-02-11 (×2): 650 mg via ORAL
  Filled 2013-02-10 (×2): qty 2

## 2013-02-10 MED ORDER — ALUM & MAG HYDROXIDE-SIMETH 200-200-20 MG/5ML PO SUSP: 30.0000 mL | ORAL | Status: DC | PRN

## 2013-02-10 MED ORDER — ONDANSETRON HCL 4 MG PO TABS: 4.0000 mg | ORAL_TABLET | Freq: Three times a day (TID) | ORAL | Status: DC | PRN

## 2013-02-10 MED ORDER — LORATADINE-PSEUDOEPHEDRINE ER 10-240 MG PO TB24: 1.0000 | ORAL_TABLET | Freq: Every day | ORAL | Status: DC

## 2013-02-10 MED ORDER — ZIPRASIDONE MESYLATE 20 MG IM SOLR
10.0000 mg | Freq: Once | INTRAMUSCULAR | Status: AC
  Administered 2013-02-10: 10 mg via INTRAMUSCULAR
  Filled 2013-02-10: qty 20

## 2013-02-10 MED ORDER — HYDROCHLOROTHIAZIDE 25 MG PO TABS
25.0000 mg | ORAL_TABLET | Freq: Every day | ORAL | Status: DC
  Administered 2013-02-11: 25 mg via ORAL
  Filled 2013-02-10: qty 1

## 2013-02-10 MED ORDER — PSEUDOEPHEDRINE HCL ER 120 MG PO TB12
120.0000 mg | ORAL_TABLET | Freq: Two times a day (BID) | ORAL | Status: DC
  Administered 2013-02-10 – 2013-02-11 (×2): 120 mg via ORAL
  Filled 2013-02-10 (×3): qty 1

## 2013-02-10 NOTE — ED Notes (Signed)
Patient is waking up, knows she's in the hospital but still not following commands.

## 2013-02-10 NOTE — ED Provider Notes (Signed)
CSN: [id number]     Arrival date & time 02/10/13  1236 History     First MD Initiated Contact with Patient 02/10/13 1240     Chief Complaint  Patient presents with  . Drug Overdose   Patient is a 47 y.o. female presenting with Overdose. The history is provided by the EMS personnel. The history is limited by the condition of the patient (Uncooperative, intoxicated).  Drug Overdose  Pt was seen at 1245.  Per EMS and family report, pt's sister found pt this morning approx 1115 "after taking some pills" and then "drinking beer and Listerine."  Unknown time of ingestion. Pt endorses SI and SA.  Otherwise uncooperative on arrival to ED.     Past Medical History  Diagnosis Date  . Arthritis     neck  . Carpal tunnel syndrome on both sides 02/2012  . Wears partial dentures     lower  . Dental crowns present     x 2 upper front  . History of MRSA infection   . Fluid retention   . Hypertension    Past Surgical History  Procedure Laterality Date  . Laparoscopic vaginal hysterectomy  02/24/2010  . Leep      x 2  . Laparoscopy  06/2010    for adhesions  . Lumbar disc surgery  03/23/2003    left L5-S1 discectomy with microdissection  . Lumbar disc surgery  05/23/2003    redo discectomy L5-S1 with microdissection  . Carpal tunnel release  03/17/2012    Procedure: CARPAL TUNNEL RELEASE;  Surgeon: Dominica Severin, MD;  Location: [facility];  Service: Orthopedics;  Laterality: Bilateral;  left limited open carpal tunnel release. right carpal tunnel injection with 1cc of celestone and 2cc of marcaine.25%  . Carpal tunnel release  06/30/2012    Procedure: CARPAL TUNNEL RELEASE;  Surgeon: Dominica Severin, MD;  Location: [facility] OR;  Service: Orthopedics;  Laterality: Right;  RIGHT LIMITED OPEN CARPAL TUNNEL RELEASE   Family History  Problem Relation Age of Onset  . Diabetes Mother   . Lung cancer    . Colon cancer      neg. family history  . Colon polyps Sister    History   Substance Use Topics  . Smoking status: Former Smoker -- 1.00 packs/day for 7 years    Types: Cigarettes    Quit date: 06/24/2010  . Smokeless tobacco: Never Used     Comment: quit smoking 1-2 yrs. ago  . Alcohol Use: 0.0 oz/week    Review of Systems  Unable to perform ROS: Psychiatric disorder    Allergies  Review of patient's allergies indicates no known allergies.  Home Medications   Current Outpatient Rx  Name  Route  Sig  Dispense  Refill  . cloNIDine (CATAPRES) 0.1 MG tablet   Oral   Take 0.1 mg by mouth 3 (three) times daily.         . cyclobenzaprine (FLEXERIL) 10 MG tablet   Oral   Take 10 mg by mouth 3 (three) times daily as needed for muscle spasms.         . fluticasone (FLONASE) 50 MCG/ACT nasal spray   Nasal   Place 2 sprays into the nose daily.         [name] gabapentin (NEURONTIN) 300 MG capsule   Oral   Take 600 mg by mouth 5 (five) times daily.         . hydrochlorothiazide 25 MG tablet   Oral  Take 25 mg by mouth daily.          [name] HYDROcodone-acetaminophen (NORCO) 10-325 MG per tablet   Oral   Take 1 tablet by mouth every 6 (six) hours as needed for pain.   5 tablet   0   . loratadine-pseudoephedrine (CLARITIN-D 24-HOUR) 10-240 MG per 24 hr tablet   Oral   Take 1 tablet by mouth daily.          BP 169/94  Pulse 89  Temp(Src) 98.1 F (36.7 C) (Rectal)  Resp 20  SpO2 100% Physical Exam 1250: Physical examination:  Nursing notes reviewed; Vital signs and O2 SAT reviewed;  Constitutional: Well developed, Well nourished, Well hydrated, Screaming and agitated;; Head:  Normocephalic, atraumatic; Eyes: EOMI, PERRL, No scleral icterus; ENMT: Mouth and pharynx normal, Mucous membranes moist; Neck: Supple, Full range of motion, No lymphadenopathy; Cardiovascular: Regular rate and rhythm, No murmur, rub, or gallop; Respiratory: Breath sounds clear & equal bilaterally, No rales, rhonchi, wheezes.  Speaking full sentences with ease, Normal  respiratory effort/excursion; Chest: Nontender, Movement normal; Abdomen: Soft, Nontender, Nondistended, Normal bowel sounds; Extremities: Pulses normal, No tenderness, No edema, No calf edema or asymmetry.; Neuro: Awake, alert, screaming and swearing at staff. Major CN grossly intact.  Speech slurred. Moves all extremities spontaneously without apparent gross focal motor deficits.; Skin: Color normal, Warm, Dry.; Psych:  Agitated, screaming, uncooperative.     ED Course   Procedures    MDM  MDM Reviewed: previous chart, nursing note and vitals Reviewed previous: ECG Interpretation: labs and ECG    Date: 02/10/2013  Rate: 89  Rhythm: normal sinus rhythm, artifact, baseline wander  QRS Axis: normal  Intervals: normal  ST/T Wave abnormalities: normal  Conduction Disutrbances:none  Narrative Interpretation:   Old EKG Reviewed: unchanged; no significant changes from previous EKG dated 11/19/2012.  Results for orders placed during the hospital encounter of 02/10/13  ETHANOL      Result Value Range   Alcohol, Ethyl (B) 182 (*) 0 - 11 mg/dL  ACETAMINOPHEN LEVEL      Result Value Range   Acetaminophen (Tylenol), Serum <15.0  10 - 30 ug/mL  SALICYLATE LEVEL      Result Value Range   Salicylate Lvl <2.0 (*) 2.8 - 20.0 mg/dL  CBC WITH DIFFERENTIAL      Result Value Range   WBC 10.5  4.0 - 10.5 K/uL   RBC 3.97  3.87 - 5.11 MIL/uL   Hemoglobin 13.4  12.0 - 15.0 g/dL   HCT 16.1  09.6 - 04.5 %   MCV 97.5  78.0 - 100.0 fL   MCH 33.8  26.0 - 34.0 pg   MCHC 34.6  30.0 - 36.0 g/dL   RDW 40.9  81.1 - 91.4 %   Platelets 213  150 - 400 K/uL   Neutrophils Relative % 63  43 - 77 %   Neutro Abs 6.5  1.7 - 7.7 K/uL   Lymphocytes Relative 30  12 - 46 %   Lymphs Abs 3.2  0.7 - 4.0 K/uL   Monocytes Relative 6  3 - 12 %   Monocytes Absolute 0.6  0.1 - 1.0 K/uL   Eosinophils Relative 1  0 - 5 %   Eosinophils Absolute 0.1  0.0 - 0.7 K/uL   Basophils Relative 1  0 - 1 %   Basophils Absolute 0.1   0.0 - 0.1 K/uL  COMPREHENSIVE METABOLIC PANEL      Result Value Range   Sodium  138  135 - 145 mEq/L   Potassium 3.5  3.5 - 5.1 mEq/L   Chloride 97  96 - 112 mEq/L   CO2 30  19 - 32 mEq/L   Glucose, Bld 101 (*) 70 - 99 mg/dL   BUN 15  6 - 23 mg/dL   Creatinine, Ser 1.61  0.50 - 1.10 mg/dL   Calcium 9.2  8.4 - 09.6 mg/dL   Total Protein 7.2  6.0 - 8.3 g/dL   Albumin 3.9  3.5 - 5.2 g/dL   AST 41 (*) 0 - 37 U/L   ALT 31  0 - 35 U/L   Alkaline Phosphatase 90  39 - 117 U/L   Total Bilirubin 0.2 (*) 0.3 - 1.2 mg/dL   GFR calc non Af Amer >90  >90 mL/min   GFR calc Af Amer >90  >90 mL/min  PROTIME-INR      Result Value Range   Prothrombin Time 14.6  11.6 - 15.2 seconds   INR 1.16  0.00 - 1.49  APTT      Result Value Range   aPTT 29  24 - 37 seconds     1600: On arrival to ED, pt agitated, screaming and swinging at ED staff, wanting to leave.  Pt given geodon IM and restrained for pt and staff safety. IVC paperwork completed. Pt has calmed after geodon, no longer yelling, but continues to have occasional episodes of agitation.  Pt's initial labs without ASA or APAP elevation. Will repeat both in 2 hours, along with etoh, for trend.  UDS pending. Will need to speak to ACT/Psych staff after she calms further and is clinically sober. Sign out to Dr. Buelah Manis.     Laray Anger, DO 02/10/13 1554

## 2013-02-10 NOTE — ED Notes (Signed)
Patient has calmed down for most part. Has frequent episodes of agitation as evidenced with squirming and attempting to sit up, no yelling.

## 2013-02-10 NOTE — ED Notes (Signed)
Per EMS patient's sister found patient passed out in the bed about 1115 this morning. Patient had snorted hydrocodone, unknown amount. Patient had bottle of hydrocodone with count of 150 which was prescribed on 01/13/2013. Patient also had 2 beers and mouthwash.

## 2013-02-10 NOTE — ED Notes (Signed)
Food offered to patient. Patient set up for dinner and went back to sleep

## 2013-02-10 NOTE — ED Notes (Signed)
Pt and belongings wanded 

## 2013-02-10 NOTE — ED Notes (Signed)
Patient combative, yelling an d uncooperative. 4 point restraints applied. 10 mg Geodon given. Unable to obtain admission information fat this time.

## 2013-02-10 NOTE — ED Notes (Signed)
Patient continuing to have periods of agitation. Some yelling but falls back to sleep.

## 2013-02-10 NOTE — ED Notes (Signed)
OZH:[id number]<HQ> Expected date:<BR> Expected time:<BR> Means of arrival:<BR> Comments:<BR> EMS-OD-pain pills/listerine

## 2013-02-10 NOTE — ED Notes (Signed)
Patient urinated in the bed, large amount.

## 2013-02-10 NOTE — ED Notes (Signed)
Asked if pt could go to bathroom on her own because note says she urinated in her bed. Also asked if pt needed to be close to nursing station or bathroom. RN Sunny Schlein giving report stated she can go to bathroom on her own and doesn't need to be near nursing station or bathroom. Pt assigned to room 41

## 2013-02-10 NOTE — ED Notes (Signed)
Poison control made aware-supportive care. Tylenol level, liver enzymes, monitor airway and vitals.

## 2013-02-10 NOTE — ED Notes (Signed)
Patient continues to have periodic agitation and trying to sit up. Restraints still intact.

## 2013-02-10 NOTE — ED Notes (Signed)
Restraints discontinued and patient sitting in chair. Answering questions appropriately.got back into bed and went to sleep

## 2013-02-10 NOTE — ED Notes (Addendum)
Pt wanded by sercurity

## 2013-02-10 NOTE — ED Provider Notes (Signed)
Pt is still mildly somnolent, however is coherent, admits she argued with husband and that is why she took all of the medications.  Now, more sober, she denies SI.  Will consult with telepsych to see if pt meets continued IVC criteria and needs inpt versus outpt, and also med recommendations if she does require inpatient.    Gavin Pound. Oletta Lamas, MD 02/10/13 [id number]

## 2013-02-10 NOTE — ED Notes (Signed)
Patient's sister called. Was given an update and told what happened upon her finding patient.

## 2013-02-10 NOTE — ED Notes (Signed)
Pt husband's name Dorene Grebe [phone]

## 2013-02-11 ENCOUNTER — Encounter (HOSPITAL_COMMUNITY): Payer: Self-pay

## 2013-02-11 ENCOUNTER — Inpatient Hospital Stay (HOSPITAL_COMMUNITY)
Admission: AD | Admit: 2013-02-11 | Discharge: 2013-02-14 | DRG: 744 | Disposition: A | Discharge status: Home / Self Care | Payer: BC Managed Care – PPO | Source: Intra-hospital | Attending: Psychiatry | Admitting: Psychiatry

## 2013-02-11 DIAGNOSIS — F1120 Opioid dependence, uncomplicated: Secondary | ICD-10-CM

## 2013-02-11 DIAGNOSIS — F1020 Alcohol dependence, uncomplicated: Secondary | ICD-10-CM

## 2013-02-11 DIAGNOSIS — F3289 Other specified depressive episodes: Secondary | ICD-10-CM | POA: Diagnosis present

## 2013-02-11 DIAGNOSIS — Z79899 Other long term (current) drug therapy: Secondary | ICD-10-CM

## 2013-02-11 DIAGNOSIS — F848 Other pervasive developmental disorders: Secondary | ICD-10-CM | POA: Diagnosis present

## 2013-02-11 DIAGNOSIS — R932 Abnormal findings on diagnostic imaging of liver and biliary tract: Secondary | ICD-10-CM

## 2013-02-11 DIAGNOSIS — K859 Acute pancreatitis without necrosis or infection, unspecified: Secondary | ICD-10-CM

## 2013-02-11 DIAGNOSIS — K7010 Alcoholic hepatitis without ascites: Secondary | ICD-10-CM

## 2013-02-11 DIAGNOSIS — F332 Major depressive disorder, recurrent severe without psychotic features: Secondary | ICD-10-CM

## 2013-02-11 DIAGNOSIS — F329 Major depressive disorder, single episode, unspecified: Secondary | ICD-10-CM

## 2013-02-11 DIAGNOSIS — I10 Essential (primary) hypertension: Secondary | ICD-10-CM | POA: Diagnosis present

## 2013-02-11 MED ORDER — GABAPENTIN 300 MG PO CAPS
600.0000 mg | ORAL_CAPSULE | Freq: Every day | ORAL | Status: DC
  Administered 2013-02-11 – 2013-02-12 (×3): 600 mg via ORAL
  Filled 2013-02-11 (×15): qty 2

## 2013-02-11 MED ORDER — HYDROXYZINE HCL 50 MG PO TABS
50.0000 mg | ORAL_TABLET | Freq: Every evening | ORAL | Status: DC | PRN
  Administered 2013-02-11 – 2013-02-13 (×3): 50 mg via ORAL

## 2013-02-11 MED ORDER — CYCLOBENZAPRINE HCL 10 MG PO TABS
10.0000 mg | ORAL_TABLET | Freq: Three times a day (TID) | ORAL | Status: DC | PRN
  Administered 2013-02-11 – 2013-02-13 (×2): 10 mg via ORAL
  Filled 2013-02-11 (×3): qty 1

## 2013-02-11 MED ORDER — MAGNESIUM HYDROXIDE 400 MG/5ML PO SUSP
30.0000 mL | Freq: Every day | ORAL | Status: DC | PRN
  Administered 2013-02-13: 30 mL via ORAL

## 2013-02-11 MED ORDER — ACETAMINOPHEN 325 MG PO TABS
650.0000 mg | ORAL_TABLET | Freq: Four times a day (QID) | ORAL | Status: DC | PRN
  Administered 2013-02-14: 650 mg via ORAL

## 2013-02-11 MED ORDER — HYDROCHLOROTHIAZIDE 25 MG PO TABS
25.0000 mg | ORAL_TABLET | Freq: Every day | ORAL | Status: DC
  Administered 2013-02-12 – 2013-02-14 (×3): 25 mg via ORAL
  Filled 2013-02-11 (×5): qty 1

## 2013-02-11 MED ORDER — ALUM & MAG HYDROXIDE-SIMETH 200-200-20 MG/5ML PO SUSP
30.0000 mL | ORAL | Status: DC | PRN
  Administered 2013-02-12: 30 mL via ORAL

## 2013-02-11 MED ORDER — NICOTINE 21 MG/24HR TD PT24
21.0000 mg | MEDICATED_PATCH | Freq: Every day | TRANSDERMAL | Status: DC
  Administered 2013-02-11: 21 mg via TRANSDERMAL
  Filled 2013-02-11: qty 1

## 2013-02-11 MED ORDER — NICOTINE 21 MG/24HR TD PT24
21.0000 mg | MEDICATED_PATCH | Freq: Every day | TRANSDERMAL | Status: DC
  Administered 2013-02-12 – 2013-02-14 (×3): 21 mg via TRANSDERMAL
  Filled 2013-02-11 (×7): qty 1

## 2013-02-11 MED ORDER — LORATADINE 10 MG PO TABS
10.0000 mg | ORAL_TABLET | Freq: Every day | ORAL | Status: DC
  Administered 2013-02-12 – 2013-02-14 (×3): 10 mg via ORAL
  Filled 2013-02-11 (×5): qty 1

## 2013-02-11 MED ORDER — CLONIDINE HCL 0.1 MG PO TABS
0.1000 mg | ORAL_TABLET | Freq: Three times a day (TID) | ORAL | Status: DC
  Administered 2013-02-11 – 2013-02-14 (×8): 0.1 mg via ORAL
  Filled 2013-02-11 (×13): qty 1

## 2013-02-11 NOTE — ED Notes (Signed)
Sheleta RN informed of transport delay

## 2013-02-11 NOTE — ED Notes (Signed)
Resting quietly, nad, pt is aware of delay in transport

## 2013-02-11 NOTE — ED Notes (Signed)
Pt ambulated to restroom without assistance.

## 2013-02-11 NOTE — Consult Note (Signed)
Reason for Consult:*Opioid od Referring Physician: LASHAWNA Villa is an 47 y.o. female.  HPI:  [name] is an 47 year  female. Pt was brought to [facility] under IVC after being found unresponsive in her home. Petition reports pt was using pain pills, alcohol, and mouthwash. Pt denied abusing her pain pills and stated her female friend was stealing her medications. Pt is now calm during assessment. Pt admits to daily abuse of her pain medication, which she continues to receive prescriptions for.  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 Pt has been crushing and snorting pain pills more recently. Pt also reports alcohol use 4x per week. 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   She reports increased stress at home due to her medication abuse.  She  denies current SI/HI/AV.  She has agreed to come in for treatment of her depression .   Past Medical History  Diagnosis Date  . Arthritis     neck  . Carpal tunnel syndrome on both sides 02/2012  . Wears partial dentures     lower  . Dental crowns present     x 2 upper front  . History of MRSA infection   . Fluid retention   . Hypertension     Past Surgical History  Procedure Laterality Date  . Laparoscopic vaginal hysterectomy  02/24/2010  . Leep      x 2  . Laparoscopy  06/2010    for adhesions  . Lumbar disc surgery  03/23/2003    left L5-S1 discectomy with microdissection  . Lumbar disc surgery  05/23/2003    redo discectomy L5-S1 with microdissection  . Carpal tunnel release  03/17/2012    Procedure: CARPAL TUNNEL RELEASE;  Surgeon: Dominica Severin, MD;  Location: [facility];  Service: Orthopedics;  Laterality: Bilateral;  left limited open carpal tunnel release. right carpal tunnel injection with 1cc of celestone and 2cc of marcaine.25%  . Carpal  tunnel release  06/30/2012    Procedure: CARPAL TUNNEL RELEASE;  Surgeon: Dominica Severin, MD;  Location: [facility] OR;  Service: Orthopedics;  Laterality: Right;  RIGHT LIMITED OPEN CARPAL TUNNEL RELEASE    Family History  Problem Relation Age of Onset  . Diabetes Mother   . Lung cancer    . Colon cancer      neg. family history  . Colon polyps Sister     Social History:  reports that she quit smoking about 2 years ago. Her smoking use included Cigarettes. She has a 7 pack-year smoking history. She has never used smokeless tobacco. She reports that  drinks alcohol. She reports that she does not use illicit drugs.  Allergies: No Known Allergies  Medications: I have reviewed the patient's current medications.  Results for orders placed during the hospital encounter of 02/10/13 (from the past 48 hour(s))  ETHANOL     Status: Abnormal   Collection Time    02/10/13  1:45 PM      Result Value Range   Alcohol, Ethyl (B) 182 (*) 0 - 11 mg/dL   Comment:            LOWEST DETECTABLE LIMIT FOR     SERUM ALCOHOL IS 11 mg/dL     FOR MEDICAL PURPOSES ONLY  ACETAMINOPHEN LEVEL  Status: None   Collection Time    02/10/13  1:45 PM      Result Value Range   Acetaminophen (Tylenol), Serum <15.0  10 - 30 ug/mL   Comment:            THERAPEUTIC CONCENTRATIONS VARY     SIGNIFICANTLY. A RANGE OF 10-30     ug/mL MAY BE AN EFFECTIVE     CONCENTRATION FOR MANY PATIENTS.     HOWEVER, SOME ARE BEST TREATED     AT CONCENTRATIONS OUTSIDE THIS     RANGE.     ACETAMINOPHEN CONCENTRATIONS     >150 ug/mL AT 4 HOURS AFTER     INGESTION AND >50 ug/mL AT 12     HOURS AFTER INGESTION ARE     OFTEN ASSOCIATED WITH TOXIC     REACTIONS.  SALICYLATE LEVEL     Status: Abnormal   Collection Time    02/10/13  1:45 PM      Result Value Range   Salicylate Lvl <2.0 (*) 2.8 - 20.0 mg/dL  OSMOLALITY     Status: Abnormal   Collection Time    02/10/13  1:45 PM      Result Value Range   Osmolality 334 (*) 275 - 300  mOsm/kg  CBC WITH DIFFERENTIAL     Status: None   Collection Time    02/10/13  1:45 PM      Result Value Range   WBC 10.5  4.0 - 10.5 K/uL   RBC 3.97  3.87 - 5.11 MIL/uL   Hemoglobin 13.4  12.0 - 15.0 g/dL   HCT 45.4  09.8 - 11.9 %   MCV 97.5  78.0 - 100.0 fL   MCH 33.8  26.0 - 34.0 pg   MCHC 34.6  30.0 - 36.0 g/dL   RDW 14.7  82.9 - 56.2 %   Platelets 213  150 - 400 K/uL   Neutrophils Relative % 63  43 - 77 %   Neutro Abs 6.5  1.7 - 7.7 K/uL   Lymphocytes Relative 30  12 - 46 %   Lymphs Abs 3.2  0.7 - 4.0 K/uL   Monocytes Relative 6  3 - 12 %   Monocytes Absolute 0.6  0.1 - 1.0 K/uL   Eosinophils Relative 1  0 - 5 %   Eosinophils Absolute 0.1  0.0 - 0.7 K/uL   Basophils Relative 1  0 - 1 %   Basophils Absolute 0.1  0.0 - 0.1 K/uL  COMPREHENSIVE METABOLIC PANEL     Status: Abnormal   Collection Time    02/10/13  1:45 PM      Result Value Range   Sodium 138  135 - 145 mEq/L   Potassium 3.5  3.5 - 5.1 mEq/L   Chloride 97  96 - 112 mEq/L   CO2 30  19 - 32 mEq/L   Glucose, Bld 101 (*) 70 - 99 mg/dL   BUN 15  6 - 23 mg/dL   Creatinine, Ser 1.30  0.50 - 1.10 mg/dL   Calcium 9.2  8.4 - 86.5 mg/dL   Total Protein 7.2  6.0 - 8.3 g/dL   Albumin 3.9  3.5 - 5.2 g/dL   AST 41 (*) 0 - 37 U/L   ALT 31  0 - 35 U/L   Alkaline Phosphatase 90  39 - 117 U/L   Total Bilirubin 0.2 (*) 0.3 - 1.2 mg/dL   GFR calc non Af Amer >90  >90  mL/min   GFR calc Af Amer >90  >90 mL/min   Comment:            The eGFR has been calculated     using the CKD EPI equation.     This calculation has not been     validated in all clinical     situations.     eGFR's persistently     <90 mL/min signify     possible Chronic Kidney Disease.  PROTIME-INR     Status: None   Collection Time    02/10/13  1:45 PM      Result Value Range   Prothrombin Time 14.6  11.6 - 15.2 seconds   INR 1.16  0.00 - 1.49  APTT     Status: None   Collection Time    02/10/13  1:45 PM      Result Value Range   aPTT 29  24 -  37 seconds  SALICYLATE LEVEL     Status: Abnormal   Collection Time    02/10/13  4:04 PM      Result Value Range   Salicylate Lvl <2.0 (*) 2.8 - 20.0 mg/dL  ACETAMINOPHEN LEVEL     Status: None   Collection Time    02/10/13  4:04 PM      Result Value Range   Acetaminophen (Tylenol), Serum <15.0  10 - 30 ug/mL   Comment:            THERAPEUTIC CONCENTRATIONS VARY     SIGNIFICANTLY. A RANGE OF 10-30     ug/mL MAY BE AN EFFECTIVE     CONCENTRATION FOR MANY PATIENTS.     HOWEVER, SOME ARE BEST TREATED     AT CONCENTRATIONS OUTSIDE THIS     RANGE.     ACETAMINOPHEN CONCENTRATIONS     >150 ug/mL AT 4 HOURS AFTER     INGESTION AND >50 ug/mL AT 12     HOURS AFTER INGESTION ARE     OFTEN ASSOCIATED WITH TOXIC     REACTIONS.  ETHANOL     Status: Abnormal   Collection Time    02/10/13  4:04 PM      Result Value Range   Alcohol, Ethyl (B) 136 (*) 0 - 11 mg/dL   Comment:            LOWEST DETECTABLE LIMIT FOR     SERUM ALCOHOL IS 11 mg/dL     FOR MEDICAL PURPOSES ONLY  URINE RAPID DRUG SCREEN (HOSP PERFORMED)     Status: None   Collection Time    02/10/13  4:09 PM      Result Value Range   Opiates NONE DETECTED  NONE DETECTED   Cocaine NONE DETECTED  NONE DETECTED   Benzodiazepines NONE DETECTED  NONE DETECTED   Amphetamines NONE DETECTED  NONE DETECTED   Tetrahydrocannabinol NONE DETECTED  NONE DETECTED   Barbiturates NONE DETECTED  NONE DETECTED   Comment:            DRUG SCREEN FOR MEDICAL PURPOSES     ONLY.  IF CONFIRMATION IS NEEDED     FOR ANY PURPOSE, NOTIFY LAB     WITHIN 5 DAYS.                LOWEST DETECTABLE LIMITS     FOR URINE DRUG SCREEN     Drug Class       Cutoff (ng/mL)     Amphetamine  1000     Barbiturate      200     Benzodiazepine   200     Tricyclics       300     Opiates          300     Cocaine          300     THC              50  URINALYSIS, ROUTINE W REFLEX MICROSCOPIC     Status: None   Collection Time    02/10/13  4:09 PM      Result  Value Range   Color, Urine YELLOW  YELLOW   APPearance CLEAR  CLEAR   Specific Gravity, Urine 1.013  1.005 - 1.030   pH 7.5  5.0 - 8.0   Glucose, UA NEGATIVE  NEGATIVE mg/dL   Hgb urine dipstick NEGATIVE  NEGATIVE   Bilirubin Urine NEGATIVE  NEGATIVE   Ketones, ur NEGATIVE  NEGATIVE mg/dL   Protein, ur NEGATIVE  NEGATIVE mg/dL   Urobilinogen, UA 0.2  0.0 - 1.0 mg/dL   Nitrite NEGATIVE  NEGATIVE   Leukocytes, UA NEGATIVE  NEGATIVE   Comment: MICROSCOPIC NOT DONE ON URINES WITH NEGATIVE PROTEIN, BLOOD, LEUKOCYTES, NITRITE, OR GLUCOSE <1000 mg/dL.    No results found.  Review of Systems  Constitutional: Negative.   HENT: Negative.   Eyes: Negative.   Respiratory: Negative.   Cardiovascular: Negative.   Gastrointestinal: Negative.   Genitourinary: Negative.   Musculoskeletal: Joint pain: hX of carpel turnel syndrome.  Skin: Negative.   Psychiatric/Behavioral: Positive for depression (REPORTS DEPRESSIONAND TOO MUCH STRESS AT HOME.), suicidal ideas (OD on her narcotic pain medications.) and substance abuse (Abuses her prescribed pain medication.). Negative for hallucinations and memory loss. The patient is nervous/anxious (Reports anxiety from stress at home). The patient does not have insomnia.    Blood pressure 120/87, pulse 86, temperature 98.9 F (37.2 C), temperature source Oral, resp. rate 18, SpO2 100.00%. Physical Exam  Constitutional: She is oriented to person, place, and time. She appears well-developed and well-nourished.  HENT:  Head: Normocephalic and atraumatic.  Eyes: Conjunctivae and EOM are normal.  Neck: Normal range of motion. Neck supple.  Cardiovascular: Normal rate, regular rhythm and normal heart sounds.   Respiratory: Effort normal and breath sounds normal.  GI: Soft. Bowel sounds are normal.  Musculoskeletal: Normal range of motion.  Neurological: She is alert and oriented to person, place, and time.  Skin: Skin is warm and dry.   Diagnosis Axis1:  Major depressive d/o, recurrent, severe Opioid dependence, Alcohol Dependence Axis11: deferred Axis 111: Arthritis, Carpel turnel syndrome, Htn, pancreatitis Axis 1V: Psychosocial problem Axis V: GAF 30 Assessment/Plan: Consult with and face to face interview  We will admit her for treatment and stabilization of her depression She is not withdrawing from opiates at this time and she does not require detoxification Patient understands we will not continue any of her narcotic pain medications.  Dahlia Byes, C  PMHNP-BC 02/11/2013, 2:09 PM   I have personally seen the patient and agreed with the findings and involved in the treatment plan. Kathryne Sharper, MD

## 2013-02-11 NOTE — ED Notes (Signed)
Report called to [facility] RN will transport to [facility] around  1630

## 2013-02-11 NOTE — ED Notes (Signed)
[employer] contacted and will transport to [facility]

## 2013-02-11 NOTE — ED Notes (Signed)
Dr Patria Mane updated w/ the medications that the pt reported she took.

## 2013-02-11 NOTE — ED Notes (Addendum)
Up tot he bathroom to shower and change scrubs 

## 2013-02-11 NOTE — Progress Notes (Signed)
Patient reports that she has been abusing her pain pills. She reports that she has been crushing and snorting her pain pills for about a year. Patient reports that she drinks beer 8-10 from Thursday until Sunday. Patient is very tremulous during admission process and is unsteady on her feet. She reports that she has had decreased appetite and has lost about 10 or more pounds. Patient reports that she overdosed on her flexeril and clonidine after she and her husband had an argument. Patient feels that her husband is not very understanding when she wants to talk to him about how she feels. Patient reports that she does not see this as a suicide attempt but as a cry for help from her husband. Patient was oriented to unit, provided fluids and snack. Patient currently denies si/hi/a/v hallucinations. Patient is currently a high fall risk d/t unsteady gait and tremors.

## 2013-02-11 NOTE — ED Notes (Signed)
Up to the bathroom 

## 2013-02-11 NOTE — ED Notes (Signed)
RN Sunny Schlein attempted to bring pt back to psych ed but bringing pt in wheelchair d/t unsteady on feet. Pt questioning why she has to come back here. [facility] psychiatrist called to do telepsych. Our tech witnessed pt unsteady on her feet with her legs shaking and such. Felicia RN took pt back to room 25 and Clinical research associate took telepsych machine back to room 25 and told rn psychiatrist would be calling soon.

## 2013-02-11 NOTE — ED Notes (Signed)
Sheriff is here to transport pt to [facility], 1 bag of belongings and home medications given to sheriff.

## 2013-02-11 NOTE — Tx Team (Signed)
Initial Interdisciplinary Treatment Plan  PATIENT STRENGTHS: (choose at least two) Ability for insight Motivation for treatment/growth  PATIENT STRESSORS: Substance abuse   PROBLEM LIST: Problem List/Patient Goals Date to be addressed Date deferred Reason deferred Estimated date of resolution  Etoh abuse 02/11/2013     Depression                  Goal- Patient reports that she would like to become a stronger person and better person.                               DISCHARGE CRITERIA:  Ability to meet basic life and health needs Adequate post-discharge living arrangements Improved stabilization in mood, thinking, and/or behavior Safe-care adequate arrangements made Verbal commitment to aftercare and medication compliance Withdrawal symptoms are absent or subacute and managed without 24-hour nursing interventionE  PRELIMINARY DISCHARGE PLAN: Return to previous living arrangement  PATIENT/FAMIILY INVOLVEMENT: This treatment plan has been presented to and reviewed with the patient, Amy Villa, and/or family member.  The patient and family have been given the opportunity to ask questions and make suggestions.  Floyce Stakes 02/11/2013, 10:37 PM

## 2013-02-11 NOTE — ED Notes (Signed)
[employer] contacted and [facility] [facility] will transport to [facility]

## 2013-02-11 NOTE — ED Notes (Signed)
Attempted to move pt to psych ED pt gait unsteady. Pt Psych ED pt has to be able to self ambulate.

## 2013-02-11 NOTE — ED Provider Notes (Signed)
Pt seen by telepsych, Dr Jacky Kindle who recommends psychiatric admission.  Olivia Mackie, MD 02/11/13 [phone]

## 2013-02-11 NOTE — ED Notes (Signed)
Dr arfeen and Josephine NP into see 

## 2013-02-11 NOTE — ED Notes (Signed)
Up on the phone, waiting for [employer] to transport

## 2013-02-11 NOTE — ED Notes (Signed)
Pt belonging in locker, pt has black tank top, blue jean shorts, black bra, black flip flops, contact case and black and white pocketbook with cell phone in it. Locker 29

## 2013-02-11 NOTE — ED Notes (Signed)
Husband notified (at patients request) of pending admission--he reports that they have been together for 10 yrs and that the pat ient has had 5-6 similar episodes (not suicidal, but anger)  and that he thinks her withdrawal from the vicodin triggered this one.  He also reports that her mother committed suicide.

## 2013-02-11 NOTE — ED Notes (Signed)
Pt ambulated to bathroom on own

## 2013-02-11 NOTE — ED Notes (Signed)
Pt reports she has had fleeting visual images.   Images white, "fluffy
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
## 2013-02-12 DIAGNOSIS — F19939 Other psychoactive substance use, unspecified with withdrawal, unspecified: Secondary | ICD-10-CM

## 2013-02-12 DIAGNOSIS — F19239 Other psychoactive substance dependence with withdrawal, unspecified: Secondary | ICD-10-CM

## 2013-02-12 DIAGNOSIS — F1120 Opioid dependence, uncomplicated: Secondary | ICD-10-CM

## 2013-02-12 MED ORDER — GABAPENTIN 600 MG PO TABS
600.0000 mg | ORAL_TABLET | Freq: Every day | ORAL | Status: DC
  Administered 2013-02-12 – 2013-02-14 (×10): 600 mg via ORAL
  Filled 2013-02-12 (×4): qty 1
  Filled 2013-02-12: qty 2
  Filled 2013-02-12 (×7): qty 1
  Filled 2013-02-12: qty 2
  Filled 2013-02-12 (×9): qty 1

## 2013-02-12 NOTE — Progress Notes (Signed)
Per Dr. Elsie Saas pt could sign in voluntary, and if she wanted to sign the 72 hour request for discharge she could. Pt signed in voluntary 02/12/13 @1530 , and also signed 72 hour request for discharge 02/12/13 @1530 .

## 2013-02-12 NOTE — Progress Notes (Signed)
Psychoeducational Group Note  Date:  02/12/2013 Time: 1015 Group Topic/Focus:  Making Healthy Choices:   The focus of this group is to help patients identify negative/unhealthy choices they were using prior to admission and identify positive/healthier coping strategies to replace them upon discharge.  Participation Level:  Active  Participation Quality:  Appropriate  Affect:  Depressed  Cognitive:  Appropriate  Insight:  Engaged  Engagement in Group:  Engaged  Additional Comments:    [name] 3:00 PM. 02/12/2013

## 2013-02-12 NOTE — [facility] Group Notes (Signed)
[facility] LCSW Group Therapy  02/12/2013   2:30 PM   Type of Therapy:  Group Therapy  Participation Level:  Active  Participation Quality:  Appropriate and Attentive  Affect:  Appropriate, Flat and Depressed  Cognitive:  Alert and Appropriate  Insight:  Developing/Improving and Engaged  Engagement in Therapy:  Developing/Improving and Engaged  Modes of Intervention:  Clarification, Confrontation, Discussion, Education, Exploration, Limit-setting, Orientation, Problem-solving, Rapport Building, Dance movement psychotherapist, Socialization and Support  Summary of Progress/Problems: The main focus of today's process group was to identify the patient's current support system and decide on other supports that can be put in place.  An emphasis was placed on using counselor, doctor, therapy groups, 12-step groups, and problem-specific support groups to expand supports, as well as doing something different than has been done before.  Also discussed the different between natural support (family and friends) vs professional support (psychiatrist and therapist).   Pt shared that her husband and sister try to be supportive but don't really understand depression and anxiety.  Pt explained that they listen but wants them to be more understanding.  Pt states that she is having anxiety being in this group setting and sharing but is trying.  CSW praised pt for doing so, as she did it well.    Reyes Ivan, LCSWA 02/12/2013 3:10 PM
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
## 2013-02-12 NOTE — Progress Notes (Signed)
Patient ID: Amy Villa, female   DOB: 1965/11/01, 47 y.o.   MRN: [id number]  D: Pt has been flat and depressed on the unit today, she reported that she was feeling much better today.Pt reported that she wanted to be discharged, and she made a mistake for coming to the hospital. Pt has attended all groups and has engaged in treatment. Pt reported being negative SI/HI, no AH/VH noted A: 15 min checks continued for patient safety. R: Pts safety maintained.

## 2013-02-12 NOTE — Progress Notes (Signed)
Writer has observed patient up and active on the unit, she is interacting with select peers. Patients appearance and gait have improved. She is not as unsteady on her feet as she was on last evening. Patient attended group this evening and participated, she reports that she feels better but still feels anxious. She requested a journal earlier and plans to journal when she feels this way. Patient is compliant with her medications and is making sure she keep herself hydrated by drinking gatorade and water. Patient voiced no other complaints, safety maintained on unit with 15 min checks. Support and encouragement offered.
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
## 2013-02-12 NOTE — Progress Notes (Signed)
Psychoeducational Group Note  Date:  02/12/2013 Time:  0930 Group Topic/Focus:  Being Thankful: This group focuses on helping patients idnentify and verbalize things in their lives they are thankful for.  Participation Level:  Active  Participation Quality:  Appropriate  Affect:  Appropriate  Cognitive:  Appropriate  Insight:  Engaged  Engagement in Group:  Engaged  Additional Comments:    [name] 6:03 PM. 02/12/2013
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
## 2013-02-13 DIAGNOSIS — F3289 Other specified depressive episodes: Secondary | ICD-10-CM | POA: Diagnosis present

## 2013-02-13 DIAGNOSIS — F1994 Other psychoactive substance use, unspecified with psychoactive substance-induced mood disorder: Secondary | ICD-10-CM

## 2013-02-13 DIAGNOSIS — F1020 Alcohol dependence, uncomplicated: Secondary | ICD-10-CM | POA: Diagnosis present

## 2013-02-13 DIAGNOSIS — F1010 Alcohol abuse, uncomplicated: Secondary | ICD-10-CM

## 2013-02-13 DIAGNOSIS — F329 Major depressive disorder, single episode, unspecified: Secondary | ICD-10-CM | POA: Diagnosis present

## 2013-02-13 DIAGNOSIS — F411 Generalized anxiety disorder: Secondary | ICD-10-CM

## 2013-02-13 DIAGNOSIS — F1910 Other psychoactive substance abuse, uncomplicated: Secondary | ICD-10-CM

## 2013-02-13 MED ORDER — HYDROXYZINE HCL 25 MG PO TABS
25.0000 mg | ORAL_TABLET | ORAL | Status: DC | PRN
  Administered 2013-02-13 – 2013-02-14 (×3): 25 mg via ORAL

## 2013-02-13 NOTE — [facility] Suicide Risk Assessment (Signed)
[facility] INPATIENT:  Family/Significant Other Suicide Prevention Education  Suicide Prevention Education:  Education Completed; Amy Villa, Husband, [phone];  has been identified by the patient as the family member/significant other with whom the patient will be residing, and identified as the person(s) who will aid the patient in the event of a mental health crisis (suicidal ideations/suicide attempt).  With written consent from the patient, the family member/significant other has been provided the following suicide prevention education, prior to the and/or following the discharge of the patient.  The suicide prevention education provided includes the following:  Suicide risk factors  Suicide prevention and interventions  National Suicide Hotline telephone number  [facility] assessment telephone number  [facility] Emergency Assistance 911  [facility] and/or Residential Mobile Crisis Unit telephone number  Request made of family/significant other to:  Remove weapons (e.g., guns, rifles, knives), all items previously/currently identified as safety concern. Husband advised patient does not have access to weapons.      Remove drugs/medications (over-the-counter, prescriptions, illicit drugs), all items previously/currently identified as a safety concern.  The family member/significant other verbalizes understanding of the suicide prevention education information provided.  The family member/significant other agrees to remove the items of safety concern listed above.  Amy Villa 02/13/2013, 3:00 PM

## 2013-02-13 NOTE — [facility] Group Notes (Signed)
[facility] LCSW Aftercare Discharge Planning Group Note   02/13/2013 12:42 PM  Participation Quality:  Appropriate  Mood/Affect:  Appropriate  Depression Rating:  0  Anxiety Rating:  5  Thoughts of Suicide:  No  Will you contract for safety?   NA  Current AVH:  No  Plan for Discharge/Comments:  Patient attending discharge planning group and actively participated in group.  She advised of having a suicide attempt following argument with husband and an argument with twin sister.  She will need a referral for outpatient services. CSW provided all participants with daily workbook and information on services offered by [facility].   Transportation Means: Patient has transportation.   Supports:  Patient has a good support system.   Amy Villa, Joesph July

## 2013-02-13 NOTE — Progress Notes (Signed)
Patient ID: Amy Villa, female   DOB: 07-Jul-1966, 48 y.o.   MRN: [id number] D: Pt. Reports "good day" rated anxiety at "2" of 10. "sleeping with vistaril, now on it during the day, help with anxiety" "looking forward to going home" "my husband and sister visited today". A: Writer introduced self to client and reviewed med. Staff will monitor q60min safety checks. Staff encouraged group. R: pt. Is safe on the unit and attended group.
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
## 2013-02-13 NOTE — Progress Notes (Signed)
Adult Psychoeducational Group Note  Date:  02/13/2013 Time:  10:40 PM  Group Topic/Focus:  Wrap-Up Group:   The focus of this group is to help patients review their daily goal of treatment and discuss progress on daily workbooks.  Participation Level:  Active  Participation Quality:  Appropriate and Attentive  Affect:  Appropriate  Cognitive:  Appropriate and Oriented  Insight: Good  Engagement in Group:  Engaged  Modes of Intervention:  Discussion, Education and Support  Additional Comments:  Pt stated her goal for the day was decreasing her anxiety and she has felt better today. "The medicine seems to be helping".  Reynolds Bowl 02/13/2013, 10:40 PM

## 2013-02-13 NOTE — Progress Notes (Signed)
Grief and Loss Group  Group members discussed their experiences and emotions surrounding grief and loss.  Pt was present and engaged for entire group. She contributed to the discussion of group rules and talked about her mother's suicide, asking the group facilitators if it was normal to still be grieving the loss of her mother even though it had happened years ago and if it might have something to do with her current depression.  Lenoard Aden [facility] Counseling Intern

## 2013-02-13 NOTE — Progress Notes (Signed)
[facility] MD Progress Note  02/13/2013 2:18 PM Amy Villa  MRN:  [id number] Subjective:  6/10 anxiety, 0/10 depression, no suicidal thoughts, appetite and sleep improved, added Vistaril 25 mg every 4 hours PRN anxiety, discussed relaxing techniques prior to sleep to increase her sleep and reduce her anxiety (baths, reading, etc.).  Her husband will stop drinking after her discharge to help her with her sobriety.  Diagnosis:   Axis I: Alcohol Abuse, Anxiety Disorder NOS, Substance Abuse and Substance Induced Mood Disorder Axis II: Deferred Axis III:  Past Medical History  Diagnosis Date  . Arthritis     neck  . Carpal tunnel syndrome on both sides 02/2012  . Wears partial dentures     lower  . Dental crowns present     x 2 upper front  . History of MRSA infection   . Fluid retention   . Hypertension    Axis IV: other psychosocial or environmental problems, problems related to social environment and problems with primary support group Axis V: 41-50 serious symptoms  ADL's:  Intact  Sleep: Good  Appetite:  Fair  Suicidal Ideation:  Denies Homicidal Ideation:  Denies  Psychiatric Specialty Exam: Review of Systems  Constitutional: Negative.   HENT: Negative.   Eyes: Negative.   Respiratory: Negative.   Cardiovascular: Negative.   Gastrointestinal: Negative.   Genitourinary: Negative.   Musculoskeletal: Negative.   Skin: Negative.   Neurological: Negative.   Endo/Heme/Allergies: Negative.   Psychiatric/Behavioral: Positive for depression and substance abuse. The patient is nervous/anxious.     Blood pressure 123/86, pulse 103, temperature 98.6 F (37 C), temperature source Oral, resp. rate 20, height 5\' 3"  (1.6 m), weight 45.813 kg (101 lb).Body mass index is 17.9 kg/(m^2).  General Appearance: Casual  Eye Contact::  Fair  Speech:  Normal Rate  Volume:  Normal  Mood:  Anxious  Affect:  Congruent  Thought Process:  Coherent  Orientation:  Full (Time, Place, and  Person)  Thought Content:  WDL  Suicidal Thoughts:  No  Homicidal Thoughts:  No  Memory:  Immediate;   Fair Recent;   Fair Remote;   Fair  Judgement:  Fair  Insight:  Fair  Psychomotor Activity:  Decreased  Concentration:  Fair  Recall:  Fair  Akathisia:  No  Handed:  Right  AIMS (if indicated):     Assets:  Communication Skills Physical Health Resilience Social Support  Sleep:  Number of Hours: 6.5   Current Medications: Current Facility-Administered Medications  Medication Dose Route Frequency Provider Last Rate Last Dose  . acetaminophen (TYLENOL) tablet 650 mg  650 mg Oral Q6H PRN Earney Navy, NP      . alum & mag hydroxide-simeth (MAALOX/MYLANTA) 200-200-20 MG/5ML suspension 30 mL  30 mL Oral Q4H PRN Earney Navy, NP   30 mL at 02/12/13 2037  . cloNIDine (CATAPRES) tablet 0.1 mg  0.1 mg Oral TID Earney Navy, NP   0.1 mg at 02/13/13 1215  . cyclobenzaprine (FLEXERIL) tablet 10 mg  10 mg Oral TID PRN Earney Navy, NP   10 mg at 02/11/13 2220  . gabapentin (NEURONTIN) tablet 600 mg  600 mg Oral 5 X Daily Nehemiah Settle, MD   600 mg at 02/13/13 1106  . hydrochlorothiazide (HYDRODIURIL) tablet 25 mg  25 mg Oral Daily Earney Navy, NP   25 mg at 02/13/13 6213  . hydrOXYzine (ATARAX/VISTARIL) tablet 25 mg  25 mg Oral Q4H PRN Nanine Means, NP      .  hydrOXYzine (ATARAX/VISTARIL) tablet 50 mg  50 mg Oral QHS PRN Earney Navy, NP   50 mg at 02/12/13 2136  . loratadine (CLARITIN) tablet 10 mg  10 mg Oral Daily Earney Navy, NP   10 mg at 02/13/13 0828  . magnesium hydroxide (MILK OF MAGNESIA) suspension 30 mL  30 mL Oral Daily PRN Earney Navy, NP   30 mL at 02/13/13 0616  . nicotine (NICODERM CQ - dosed in mg/24 hours) patch 21 mg  21 mg Transdermal Daily Earney Navy, NP   21 mg at 02/13/13 0620    Lab Results: No results found for this or any previous visit (from the past 48 hour(s)).  Physical Findings: AIMS:  Facial and Oral Movements Muscles of Facial Expression: None, normal Lips and Perioral Area: None, normal Jaw: None, normal Tongue: None, normal,Extremity Movements Upper (arms, wrists, hands, fingers): None, normal Lower (legs, knees, ankles, toes): None, normal, Trunk Movements Neck, shoulders, hips: None, normal, Overall Severity Severity of abnormal movements (highest score from questions above): None, normal Incapacitation due to abnormal movements: None, normal Patient's awareness of abnormal movements (rate only patient's report): No Awareness, Dental Status Current problems with teeth and/or dentures?: No Does patient usually wear dentures?: No  CIWA:  CIWA-Ar Total: 1 COWS:  COWS Total Score: 3  Treatment Plan Summary: Daily contact with patient to assess and evaluate symptoms and progress in treatment Medication management  Plan:  Review of chart, vital signs, medications, and notes. 1-Individual and group therapy 2-Medication management for depression and anxiety:  Medications reviewed with the patient and she stated anxiety was improving but would like something PRN-Vistaril 25 mg every four hours PRN anxiety 3-Coping skills for depression and anxiety, alcohol abuse 4-Continue crisis stabilization and management 5-Address health issues--monitoring vital signs, stable 6-Treatment plan in progress to prevent relapse of depression, alcohol abuse, and anxiety  Medical Decision Making Problem Points:  Established problem, stable/improving (1) and Review of psycho-social stressors (1) Data Points:  Review of new medications or change in dosage (2)  I certify that inpatient services furnished can reasonably be expected to improve the patient's condition.   Nanine Means, PMH-NP 02/13/2013, 2:18 PM

## 2013-02-13 NOTE — Progress Notes (Addendum)
D:  Patient's self inventory sheet, patient sleeps well, improving appetite, high energy level, good attention span.  Rated depression and hopelessness #1.  Has experienced withdrawal tremors.  Denied SI.  Has experienced tremors, lightheaded, pain, headaches.  Worst pain #5.  After discharge, plans to take medication, go to outpatient care, make sure she does not involve herself around negativity, go to work.  No questions for staff.  No problems taking meds after discharge. A:  Medications administered per MD orders.  Emotional support and encouragement given patient. R:  Denied SI and HI.   Denied A/V hallucinations.  Denied pain.  Will continue to monitor patient for safety with 15 minute checks.  Safety maintained.  Patient stated she often feels "panicky", could never stand in front of class in school to give reports.  Did not graduate from high school because she could not give reports, etc.  Patient given prune juice for constipation.  Received MOM this morning.

## 2013-02-13 NOTE — [facility] Group Notes (Signed)
[facility] LCSW Group Therapy          Overcoming Obstacles       1:15 -2:30        02/13/2013  3:16 PM     Type of Therapy:  Group Therapy  Participation Level:  Appropriate  Participation Quality:  Appropriate  Affect:  Appropriate, Alert  Cognitive:  Attentive Appropriate  Insight: Developing/Improving Engaged  Engagement in Therapy: Developing/Imprvoing Engaged  Modes of Intervention:  Discussion Exploration  Education Rapport BuildingProblem-Solving Support  Summary of Progress/Problems:  The main focus of today's group was overcoming  obstacles.   Wynn Banker 02/13/2013   3:16 PM

## 2013-02-14 DIAGNOSIS — F848 Other pervasive developmental disorders: Secondary | ICD-10-CM

## 2013-02-14 DIAGNOSIS — F332 Major depressive disorder, recurrent severe without psychotic features: Secondary | ICD-10-CM

## 2013-02-14 MED ORDER — POTASSIUM CHLORIDE CRYS ER 10 MEQ PO TBCR
10.0000 meq | EXTENDED_RELEASE_TABLET | Freq: Every day | ORAL | Status: DC
  Administered 2013-02-14: 10 meq via ORAL
  Filled 2013-02-14 (×4): qty 1

## 2013-02-14 MED ORDER — CLONIDINE HCL 0.1 MG PO TABS: 0.1000 mg | ORAL_TABLET | Freq: Three times a day (TID) | ORAL | Status: DC

## 2013-02-14 MED ORDER — GABAPENTIN 300 MG PO CAPS: 600.0000 mg | ORAL_CAPSULE | Freq: Every day | ORAL | Status: DC

## 2013-02-14 MED ORDER — HYDROCHLOROTHIAZIDE 25 MG PO TABS: 25.0000 mg | ORAL_TABLET | Freq: Every day | ORAL | Status: DC

## 2013-02-14 MED ORDER — LORATADINE 10 MG PO TABS: 10.0000 mg | ORAL_TABLET | Freq: Every day | ORAL | Status: DC

## 2013-02-14 MED ORDER — HYDROXYZINE HCL 50 MG PO TABS: 50.0000 mg | ORAL_TABLET | Freq: Every evening | ORAL | Status: DC | PRN

## 2013-02-14 MED ORDER — POTASSIUM CHLORIDE CRYS ER 10 MEQ PO TBCR: 10.0000 meq | EXTENDED_RELEASE_TABLET | Freq: Every day | ORAL | Status: DC

## 2013-02-14 MED ORDER — HYDROXYZINE HCL 25 MG PO TABS: 25.0000 mg | ORAL_TABLET | ORAL | Status: DC | PRN

## 2013-02-14 MED ORDER — CYCLOBENZAPRINE HCL 10 MG PO TABS: 10.0000 mg | ORAL_TABLET | Freq: Three times a day (TID) | ORAL | Status: DC | PRN

## 2013-02-14 NOTE — Progress Notes (Signed)
The focus of this group is to educate the patient on the purpose and policies of crisis stabilization and provide a format to answer questions about their admission.  The group details unit policies and expectations of patients while admitted.  Patient attended 0900 nurse education group.  Had active participation, appropriate affect, alert, appropriate insight, appropriate engagement.  Patient will work on goals for discharge.

## 2013-02-14 NOTE — Progress Notes (Signed)
Discharge Note:  Patient discharged home with husband.  Denied SI and HI.  Denied A/V hallucinations.  Denied pain.  Patient stated she received all her belongings, clothing, toiletries, miscellaneous items, prescriptions, medications.  Stated she received suicide prevention information which was discussed with her and which she understood.  Patient stated she appreciated all assistance received from staff while at [facility].  Patient has been cooperative and pleasant.

## 2013-02-14 NOTE — Progress Notes (Signed)
Recreation Therapy Notes  Date: 07.28.2014 Time: 3:00pm Location: [address] Dayroom  Group Topic: Problem Solving, Communication, Team Work  Goal Area(s) Addresses:  Patient will effectively work in a team with other group members. Patient will verbalize skills needed to make activity successful.  Patient will verbalize ways to use skills used in group session post d/c.  Behavioral Response: Engaged, Attentive, Appropriate  Intervention: Problem Solving Activity  Activity: [employer]. Patients were given 20 spaghetti sticks and 11 marshmallows. Within teams patients were asked to build the tallest free standing structure.  Education: Customer service manager, Pharmacologist, Discharge Planning  Education Outcome: Acknowledges understanding  Clinical Observations/Feedback: Patient actively engaged in group activity. Patient offered suggestions to group members and peers appeared to receive patient suggestions well. Patient was able to effectively communicate and work well with her team mates. Patient with peer ultimately successful at building a freestanding structure. Patient did not spontaneously contributed to opening discussion, however patient actively engaged in wrap up discussion, identifying communication and team work as skills necessary to complete activity. Patient additionally verbalized ways she can use these skills post d/c.    Marykay Lex Tremond Shimabukuro, LRT/CTRS  Oaklyn Jakubek L 02/14/2013 8:13 AM

## 2013-02-14 NOTE — Progress Notes (Signed)
D:  Patient's self inventory sheet, patient sleeps well, has good appetite, normal energy level, good attention span.  Denied depression and hopelessness, anxiety #1.  Has experienced tremors in past 24 hours.  Denied SI.  Denied physical problems.  Worst pain 31.  "Make time for myself, have good communication, respect each other.  Can I go home now?" Shoulder pain gone. A:  Medications administered per MD orders.  Emotional support and encouragement given patient. R:  Denied SI and HI.   Denied A/V hallucinations.  Will continue to monitor for safety with 15 minute checks.  Safety maintained.
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
## 2013-02-14 NOTE — Progress Notes (Signed)
[facility] Adult Case Management Discharge Plan :  Will you be returning to the same living situation after discharge: Yes,  returning home At discharge, do you have transportation home?:Yes,  family will pick pt up Do you have the ability to pay for your medications:Yes,  access to meds  Release of information consent forms completed and in the chart;  Patient's signature needed at discharge.  Patient to Follow up at: Follow-up Information   Follow up with [facility] On 02/15/2013. (You are scheduled with [facility] at 10 AM on Wednesday, February 15, 2013)    Contact information:   [address] [place], [place]   [address]  [phone]      Patient denies SI/HI:   Yes,  denies SI/HI    Safety Planning and Suicide Prevention discussed:  Yes,  discussed with pt and pt's husband.  See suicide prevention education note.    Carmina Miller 02/14/2013, 10:31 AM

## 2013-02-15 NOTE — Progress Notes (Signed)
Agree with assessment and plan Mashell Sieben A. Ryszard Socarras, M.D. 

## 2013-02-17 NOTE — Progress Notes (Signed)
Patient Discharge Instructions:  After Visit Summary (AVS):   Faxed to:  02/17/13 Discharge Summary Note:   Faxed to:  02/17/13 Psychiatric Admission Assessment Note:   Faxed to:  02/17/13 Suicide Risk Assessment - Discharge Assessment:   Faxed to:  02/17/13 Faxed/Sent to the Next Level Care provider:  02/17/13 Faxed to [facility] @ [phone]  Jerelene Redden, 02/17/2013, 3:16 PM

## 2013-05-01 ENCOUNTER — Other Ambulatory Visit (HOSPITAL_COMMUNITY): Payer: Self-pay | Admitting: *Deleted

## 2013-05-01 NOTE — Telephone Encounter (Signed)
Medication refill request sent for Cyclobenzaprine, pt not seen in outpatient clinic. Will need to follow up with outpatient provider. Unable to fill request at this time.

## 2013-06-20 ENCOUNTER — Encounter (HOSPITAL_BASED_OUTPATIENT_CLINIC_OR_DEPARTMENT_OTHER): Payer: Self-pay | Admitting: *Deleted

## 2013-06-20 NOTE — Progress Notes (Signed)
Pt has been here for ctr-she will come by for bmet-to bring all meds and overnight bag- Take am meds with water-she is used to taking norco or percocets daily,so may have high tol for meds

## 2013-06-21 ENCOUNTER — Encounter (HOSPITAL_BASED_OUTPATIENT_CLINIC_OR_DEPARTMENT_OTHER)
Admission: RE | Admit: 2013-06-21 | Discharge: 2013-06-21 | Disposition: A | Discharge status: Home / Self Care | Payer: BC Managed Care – PPO | Source: Ambulatory Visit | Attending: Orthopedic Surgery | Admitting: Orthopedic Surgery

## 2013-06-21 LAB — BASIC METABOLIC PANEL
BUN: 10 mg/dL (ref 6–23)
CO2: 25 mEq/L (ref 19–32)
Calcium: 9 mg/dL (ref 8.4–10.5)
Chloride: 100 mEq/L (ref 96–112)
Creatinine, Ser: 0.55 mg/dL (ref 0.50–1.10)
GFR calc Af Amer: >90 mL/min (ref >90)
GFR calc non Af Amer: >90 mL/min (ref >90)

## 2013-06-22 ENCOUNTER — Other Ambulatory Visit: Payer: Self-pay | Admitting: Orthopedic Surgery

## 2013-06-23 ENCOUNTER — Ambulatory Visit (HOSPITAL_BASED_OUTPATIENT_CLINIC_OR_DEPARTMENT_OTHER)
Admission: RE | Admit: 2013-06-23 | Discharge: 2013-06-23 | Disposition: A | Discharge status: Home / Self Care | Payer: BC Managed Care – PPO | Source: Ambulatory Visit | Attending: Orthopedic Surgery | Admitting: Orthopedic Surgery

## 2013-06-23 ENCOUNTER — Encounter (HOSPITAL_BASED_OUTPATIENT_CLINIC_OR_DEPARTMENT_OTHER)
Admission: RE | Disposition: A | Discharge status: Home / Self Care | Payer: Self-pay | Source: Ambulatory Visit | Attending: Orthopedic Surgery

## 2013-06-23 ENCOUNTER — Ambulatory Visit (HOSPITAL_BASED_OUTPATIENT_CLINIC_OR_DEPARTMENT_OTHER): Payer: BC Managed Care – PPO | Admitting: Certified Registered"

## 2013-06-23 ENCOUNTER — Encounter (HOSPITAL_BASED_OUTPATIENT_CLINIC_OR_DEPARTMENT_OTHER): Payer: BC Managed Care – PPO | Admitting: Certified Registered"

## 2013-06-23 ENCOUNTER — Encounter (HOSPITAL_BASED_OUTPATIENT_CLINIC_OR_DEPARTMENT_OTHER): Payer: Self-pay | Admitting: *Deleted

## 2013-06-23 DIAGNOSIS — F329 Major depressive disorder, single episode, unspecified: Secondary | ICD-10-CM | POA: Insufficient documentation

## 2013-06-23 DIAGNOSIS — M189 Osteoarthritis of first carpometacarpal joint, unspecified: Secondary | ICD-10-CM | POA: Diagnosis present

## 2013-06-23 DIAGNOSIS — M65839 Other synovitis and tenosynovitis, unspecified forearm: Secondary | ICD-10-CM | POA: Insufficient documentation

## 2013-06-23 DIAGNOSIS — Z87891 Personal history of nicotine dependence: Secondary | ICD-10-CM | POA: Insufficient documentation

## 2013-06-23 DIAGNOSIS — Z01812 Encounter for preprocedural laboratory examination: Secondary | ICD-10-CM | POA: Insufficient documentation

## 2013-06-23 DIAGNOSIS — M19049 Primary osteoarthritis, unspecified hand: Secondary | ICD-10-CM | POA: Insufficient documentation

## 2013-06-23 DIAGNOSIS — Z8614 Personal history of Methicillin resistant Staphylococcus aureus infection: Secondary | ICD-10-CM | POA: Insufficient documentation

## 2013-06-23 DIAGNOSIS — I10 Essential (primary) hypertension: Secondary | ICD-10-CM | POA: Insufficient documentation

## 2013-06-23 DIAGNOSIS — F3289 Other specified depressive episodes: Secondary | ICD-10-CM | POA: Insufficient documentation

## 2013-06-23 DIAGNOSIS — K759 Inflammatory liver disease, unspecified: Secondary | ICD-10-CM | POA: Insufficient documentation

## 2013-06-23 HISTORY — DX: Encounter for fitting and adjustment of spectacles and contact lenses: Z46.0

## 2013-06-23 HISTORY — PX: CARPOMETACARPAL (CMC) FUSION OF THUMB: SHX6290

## 2013-06-23 SURGERY — CARPOMETACARPAL (CMC) FUSION OF THUMB
Anesthesia: General | Site: Hand | Laterality: Left

## 2013-06-23 MED ORDER — DOCUSATE SODIUM 100 MG PO CAPS
100.0000 mg | ORAL_CAPSULE | Freq: Two times a day (BID) | ORAL | Status: DC
  Administered 2013-06-23: 100 mg via ORAL

## 2013-06-23 MED ORDER — VANCOMYCIN HCL 500 MG IV SOLR: 500.0000 mg | Freq: Once | INTRAVENOUS | Status: DC

## 2013-06-23 MED ORDER — OXYCODONE-ACETAMINOPHEN 10-325 MG PO TABS: 1.0000 | ORAL_TABLET | ORAL | Status: DC | PRN

## 2013-06-23 MED ORDER — LACTATED RINGERS IV SOLN
INTRAVENOUS | Status: DC
  Administered 2013-06-23 (×2): via INTRAVENOUS

## 2013-06-23 MED ORDER — DOCUSATE SODIUM 100 MG PO CAPS
ORAL_CAPSULE | ORAL | Status: AC
  Filled 2013-06-23: qty 1

## 2013-06-23 MED ORDER — MIDAZOLAM HCL 2 MG/2ML IJ SOLN
1.0000 mg | INTRAMUSCULAR | Status: DC | PRN
  Administered 2013-06-23: 2 mg via INTRAVENOUS

## 2013-06-23 MED ORDER — CEFAZOLIN SODIUM-DEXTROSE 2-3 GM-% IV SOLR
2.0000 g | INTRAVENOUS | Status: AC
  Administered 2013-06-23: 2 g via INTRAVENOUS

## 2013-06-23 MED ORDER — GABAPENTIN 300 MG PO CAPS
600.0000 mg | ORAL_CAPSULE | Freq: Every day | ORAL | Status: DC
  Administered 2013-06-23: 600 mg via ORAL
  Filled 2013-06-23: qty 2

## 2013-06-23 MED ORDER — MIDAZOLAM HCL 2 MG/2ML IJ SOLN
INTRAMUSCULAR | Status: AC
  Filled 2013-06-23: qty 2

## 2013-06-23 MED ORDER — ONDANSETRON HCL 4 MG PO TABS: 4.0000 mg | ORAL_TABLET | Freq: Four times a day (QID) | ORAL | Status: DC | PRN

## 2013-06-23 MED ORDER — VITAMIN C 500 MG PO TABS
1000.0000 mg | ORAL_TABLET | Freq: Every day | ORAL | Status: DC
  Administered 2013-06-23: 1000 mg via ORAL

## 2013-06-23 MED ORDER — ONDANSETRON HCL 4 MG/2ML IJ SOLN
INTRAMUSCULAR | Status: DC | PRN
  Administered 2013-06-23: 4 mg via INTRAVENOUS

## 2013-06-23 MED ORDER — PROMETHAZINE HCL 12.5 MG RE SUPP: 12.5000 mg | Freq: Four times a day (QID) | RECTAL | Status: DC | PRN

## 2013-06-23 MED ORDER — DEXAMETHASONE SODIUM PHOSPHATE 10 MG/ML IJ SOLN
INTRAMUSCULAR | Status: DC | PRN
  Administered 2013-06-23: 10 mg via INTRAVENOUS

## 2013-06-23 MED ORDER — CEFAZOLIN SODIUM 1-5 GM-% IV SOLN
INTRAVENOUS | Status: AC
  Filled 2013-06-23: qty 100

## 2013-06-23 MED ORDER — VANCOMYCIN HCL 1000 MG IV SOLR
1000.0000 mg | INTRAVENOUS | Status: DC | PRN
  Administered 2013-06-23: 1000 mg via INTRAVENOUS

## 2013-06-23 MED ORDER — FAMOTIDINE 20 MG PO TABS: 20.0000 mg | ORAL_TABLET | Freq: Two times a day (BID) | ORAL | Status: DC | PRN

## 2013-06-23 MED ORDER — ONDANSETRON HCL 4 MG/2ML IJ SOLN: 4.0000 mg | Freq: Once | INTRAMUSCULAR | Status: DC | PRN

## 2013-06-23 MED ORDER — DOXYCYCLINE HYCLATE 100 MG PO CAPS: 100.0000 mg | ORAL_CAPSULE | Freq: Two times a day (BID) | ORAL | Status: DC

## 2013-06-23 MED ORDER — POTASSIUM CHLORIDE CRYS ER 10 MEQ PO TBCR
10.0000 meq | EXTENDED_RELEASE_TABLET | Freq: Every day | ORAL | Status: DC
  Filled 2013-06-23: qty 1

## 2013-06-23 MED ORDER — CYCLOBENZAPRINE HCL 10 MG PO TABS: 10.0000 mg | ORAL_TABLET | Freq: Three times a day (TID) | ORAL | Status: DC | PRN

## 2013-06-23 MED ORDER — METHOCARBAMOL 500 MG PO TABS: 500.0000 mg | ORAL_TABLET | Freq: Four times a day (QID) | ORAL | Status: DC

## 2013-06-23 MED ORDER — SODIUM CHLORIDE 0.45 % IV SOLN: INTRAVENOUS | Status: DC

## 2013-06-23 MED ORDER — OXYCODONE HCL 5 MG PO TABS
5.0000 mg | ORAL_TABLET | ORAL | Status: DC | PRN
  Administered 2013-06-23: 10 mg via ORAL

## 2013-06-23 MED ORDER — OXYCODONE HCL 5 MG PO TABS: 5.0000 mg | ORAL_TABLET | Freq: Once | ORAL | Status: DC | PRN

## 2013-06-23 MED ORDER — HYDROXYZINE HCL 50 MG PO TABS: 50.0000 mg | ORAL_TABLET | Freq: Every evening | ORAL | Status: DC | PRN

## 2013-06-23 MED ORDER — OXYCODONE HCL 5 MG/5ML PO SOLN: 5.0000 mg | Freq: Once | ORAL | Status: DC | PRN

## 2013-06-23 MED ORDER — CLONIDINE HCL 0.1 MG PO TABS
0.1000 mg | ORAL_TABLET | Freq: Three times a day (TID) | ORAL | Status: DC
  Filled 2013-06-23: qty 1

## 2013-06-23 MED ORDER — HYDROMORPHONE HCL PF 1 MG/ML IJ SOLN
0.2500 mg | INTRAMUSCULAR | Status: DC | PRN
Start: 2013-06-23 — End: 2013-06-23

## 2013-06-23 MED ORDER — MORPHINE SULFATE 2 MG/ML IJ SOLN: 1.0000 mg | INTRAMUSCULAR | Status: DC | PRN

## 2013-06-23 MED ORDER — CHLORHEXIDINE GLUCONATE 4 % EX LIQD: 60.0000 mL | Freq: Once | CUTANEOUS | Status: DC

## 2013-06-23 MED ORDER — FENTANYL CITRATE 0.05 MG/ML IJ SOLN
INTRAMUSCULAR | Status: AC
  Filled 2013-06-23: qty 6

## 2013-06-23 MED ORDER — LORATADINE 10 MG PO TABS
10.0000 mg | ORAL_TABLET | Freq: Every day | ORAL | Status: DC
  Filled 2013-06-23: qty 1

## 2013-06-23 MED ORDER — PROPOFOL 10 MG/ML IV BOLUS
INTRAVENOUS | Status: DC | PRN
  Administered 2013-06-23: 150 mg via INTRAVENOUS

## 2013-06-23 MED ORDER — EPHEDRINE SULFATE 50 MG/ML IJ SOLN
INTRAMUSCULAR | Status: DC | PRN
  Administered 2013-06-23 (×2): 10 mg via INTRAVENOUS

## 2013-06-23 MED ORDER — MIDAZOLAM HCL 5 MG/5ML IJ SOLN
INTRAMUSCULAR | Status: DC | PRN
  Administered 2013-06-23: 2 mg via INTRAVENOUS

## 2013-06-23 MED ORDER — FENTANYL CITRATE 0.05 MG/ML IJ SOLN
50.0000 ug | INTRAMUSCULAR | Status: DC | PRN
  Administered 2013-06-23: 100 ug via INTRAVENOUS

## 2013-06-23 MED ORDER — BUPIVACAINE HCL (PF) 0.25 % IJ SOLN
INTRAMUSCULAR | Status: AC
  Filled 2013-06-23: qty 30

## 2013-06-23 MED ORDER — PROPOFOL 10 MG/ML IV EMUL
INTRAVENOUS | Status: AC
  Filled 2013-06-23: qty 50

## 2013-06-23 MED ORDER — LACTATED RINGERS IV SOLN
INTRAVENOUS | Status: DC
  Administered 2013-06-23: 75 mL/h via INTRAVENOUS

## 2013-06-23 MED ORDER — LIDOCAINE HCL (CARDIAC) 20 MG/ML IV SOLN
INTRAVENOUS | Status: DC | PRN
  Administered 2013-06-23: 60 mg via INTRAVENOUS

## 2013-06-23 MED ORDER — HYDROCHLOROTHIAZIDE 25 MG PO TABS
25.0000 mg | ORAL_TABLET | Freq: Every day | ORAL | Status: DC
  Filled 2013-06-23: qty 1

## 2013-06-23 MED ORDER — ALPRAZOLAM 0.5 MG PO TABS: 0.5000 mg | ORAL_TABLET | Freq: Four times a day (QID) | ORAL | Status: DC | PRN

## 2013-06-23 MED ORDER — ONDANSETRON HCL 4 MG/2ML IJ SOLN: 4.0000 mg | Freq: Four times a day (QID) | INTRAMUSCULAR | Status: DC | PRN

## 2013-06-23 MED ORDER — OXYCODONE HCL 5 MG PO TABS
ORAL_TABLET | ORAL | Status: AC
  Filled 2013-06-23: qty 2

## 2013-06-23 SURGICAL SUPPLY — 68 items
BANDAGE ELASTIC 3 VELCRO ST LF (GAUZE/BANDAGES/DRESSINGS) ×2 IMPLANT
BLADE SURG 15 STRL LF DISP TIS (BLADE) ×3 IMPLANT
BLADE SURG 15 STRL SS (BLADE) ×4
BLADE SURG ROTATE 9660 (MISCELLANEOUS) ×1 IMPLANT
BNDG CONFORM 3 STRL LF (GAUZE/BANDAGES/DRESSINGS) ×2 IMPLANT
BNDG GAUZE ELAST 4 BULKY (GAUZE/BANDAGES/DRESSINGS) ×2 IMPLANT
BRUSH SCRUB EZ PLAIN DRY (MISCELLANEOUS) ×2 IMPLANT
CANISTER SUCT 1200ML W/VALVE (MISCELLANEOUS) ×1 IMPLANT
CORDS BIPOLAR (ELECTRODE) ×2 IMPLANT
COVER MAYO STAND STRL (DRAPES) ×2 IMPLANT
COVER TABLE BACK 60X90 (DRAPES) ×2 IMPLANT
CUFF TOURNIQUET SINGLE 18IN (TOURNIQUET CUFF) ×1 IMPLANT
DECANTER SPIKE VIAL GLASS SM (MISCELLANEOUS) IMPLANT
DRAIN TLS ROUND 10FR (DRAIN) IMPLANT
DRAPE EXTREMITY T 121X128X90 (DRAPE) ×2 IMPLANT
DRAPE OEC MINIVIEW 54X84 (DRAPES) IMPLANT
DRAPE SURG 17X23 STRL (DRAPES) ×2 IMPLANT
DRSG EMULSION OIL 3X3 NADH (GAUZE/BANDAGES/DRESSINGS) ×2 IMPLANT
GAUZE SPONGE 4X4 16PLY XRAY LF (GAUZE/BANDAGES/DRESSINGS) IMPLANT
GAUZE XEROFORM 1X8 LF (GAUZE/BANDAGES/DRESSINGS) ×2 IMPLANT
GLOVE BIO SURGEON STRL SZ 6.5 (GLOVE) ×1 IMPLANT
GLOVE BIOGEL M STRL SZ7.5 (GLOVE) ×2 IMPLANT
GLOVE BIOGEL PI IND STRL 7.0 (GLOVE) IMPLANT
GLOVE BIOGEL PI INDICATOR 7.0 (GLOVE) ×1
GLOVE SS BIOGEL STRL SZ 8 (GLOVE) ×1 IMPLANT
GLOVE SUPERSENSE BIOGEL SZ 8 (GLOVE) ×1
GOWN PREVENTION PLUS XLARGE (GOWN DISPOSABLE) ×3 IMPLANT
GOWN PREVENTION PLUS XXLARGE (GOWN DISPOSABLE) ×2 IMPLANT
GUIDEWIRE THREADED 150MM (WIRE) ×1 IMPLANT
NDL HYPO 25X1 1.5 SAFETY (NEEDLE) ×1 IMPLANT
NEEDLE HYPO 22GX1.5 SAFETY (NEEDLE) IMPLANT
NEEDLE HYPO 25X1 1.5 SAFETY (NEEDLE) IMPLANT
NS IRRIG 1000ML POUR BTL (IV SOLUTION) ×2 IMPLANT
PACK BASIN DAY SURGERY FS (CUSTOM PROCEDURE TRAY) ×2 IMPLANT
PAD CAST 3X4 CTTN HI CHSV (CAST SUPPLIES) ×2 IMPLANT
PADDING CAST ABS 3INX4YD NS (CAST SUPPLIES) ×1
PADDING CAST ABS 4INX4YD NS (CAST SUPPLIES)
PADDING CAST ABS COTTON 3X4 (CAST SUPPLIES) ×1 IMPLANT
PADDING CAST ABS COTTON 4X4 ST (CAST SUPPLIES) ×1 IMPLANT
PADDING CAST COTTON 3X4 STRL (CAST SUPPLIES) ×4
PASSER SUT SWANSON 36MM LOOP (INSTRUMENTS) IMPLANT
PUTTY DBM STAGRAFT 2CC (Putty) ×1 IMPLANT
SHEET MEDIUM DRAPE 40X70 STRL (DRAPES) IMPLANT
SLING ARM FOAM STRAP SML (SOFTGOODS) ×1 IMPLANT
SPLINT FIBERGLASS 3X35 (CAST SUPPLIES) ×1 IMPLANT
SPLINT PLASTER CAST XFAST 3X15 (CAST SUPPLIES) IMPLANT
SPLINT PLASTER XTRA FASTSET 3X (CAST SUPPLIES)
SPONGE GAUZE 4X4 12PLY (GAUZE/BANDAGES/DRESSINGS) ×2 IMPLANT
SPONGE SURGIFOAM ABS GEL 12-7 (HEMOSTASIS) IMPLANT
STOCKINETTE 4X48 STRL (DRAPES) ×2 IMPLANT
STOCKINETTE SYNTHETIC 3 UNSTER (CAST SUPPLIES) ×2 IMPLANT
SUCTION FRAZIER TIP 10 FR DISP (SUCTIONS) ×2 IMPLANT
SUT BONE WAX W31G (SUTURE) IMPLANT
SUT FIBERWIRE 3-0 18 TAPR NDL (SUTURE) ×4
SUT FIBERWIRE 4-0 18 TAPR NDL (SUTURE)
SUT PROLENE 4 0 PS 2 18 (SUTURE) ×4 IMPLANT
SUT VIC AB 4-0 P-3 18XBRD (SUTURE) IMPLANT
SUT VIC AB 4-0 P3 18 (SUTURE)
SUTURE FIBERWR 3-0 18 TAPR NDL (SUTURE) ×2 IMPLANT
SUTURE FIBERWR 4-0 18 TAPR NDL (SUTURE) IMPLANT
SYR BULB 3OZ (MISCELLANEOUS) ×2 IMPLANT
SYR CONTROL 10ML LL (SYRINGE) ×2 IMPLANT
TAPE SURG TRANSPORE 1 IN (GAUZE/BANDAGES/DRESSINGS) ×1 IMPLANT
TAPE SURGICAL TRANSPORE 1 IN (GAUZE/BANDAGES/DRESSINGS) ×1
TOWEL OR 17X24 6PK STRL BLUE (TOWEL DISPOSABLE) ×4 IMPLANT
TOWEL OR NON WOVEN STRL DISP B (DISPOSABLE) ×3 IMPLANT
TUBE CONNECTING 20X1/4 (TUBING) ×2 IMPLANT
UNDERPAD 30X30 INCONTINENT (UNDERPADS AND DIAPERS) ×2 IMPLANT

## 2013-06-23 NOTE — Anesthesia Postprocedure Evaluation (Signed)
  Anesthesia Post-op Note  Patient: Amy Villa  Procedure(s) Performed: Procedure(s): LEFT CARPOMETACARPEL (CMC) FUSION OF THUMB WITH AUTOGRAFT FROM RADIUS AND REPAIR AS NECESSARY (Left)  Patient Location: PACU  Anesthesia Type:General  Level of Consciousness: awake, alert  and oriented  Airway and Oxygen Therapy: Patient Spontanous Breathing  Post-op Pain: none  Post-op Assessment: Post-op Vital signs reviewed, Patient's Cardiovascular Status Stable, Respiratory Function Stable, Patent Airway and Pain level controlled  Post-op Vital Signs: stable  Complications: No apparent anesthesia complications

## 2013-06-23 NOTE — H&P (Signed)
Amy Villa is an 47 y.o. female.   Chief Complaint: Left carpal metacarpal arthritis about the thumb HPI: Patient presents for left CMC thumb reconstruction/arthrodesis She notes no other pain complaints at present. I discussed all issues with the family. She is prepared ready to proceed with her surgical intervention left upper extremity .Marland KitchenPatient presents for evaluation and treatment of the of their upper extremity predicament. The patient denies neck back chest or of abdominal pain. The patient notes that they have no lower extremity problems. The patient from primarily complains of the upper extremity pain noted.  Past Medical History  Diagnosis Date  . Arthritis     neck  . Carpal tunnel syndrome on both sides 02/2012  . Wears partial dentures     lower  . Dental crowns present     x 2 upper front  . History of MRSA infection   . Fluid retention   . Hypertension   . Contact lens/glasses fitting     wears contacts or glasses    Past Surgical History  Procedure Laterality Date  . Laparoscopic vaginal hysterectomy  02/24/2010  . Leep      x 2  . Laparoscopy  06/2010    for adhesions  . Lumbar disc surgery  03/23/2003    left L5-S1 discectomy with microdissection  . Lumbar disc surgery  05/23/2003    redo discectomy L5-S1 with microdissection  . Carpal tunnel release  03/17/2012    Procedure: CARPAL TUNNEL RELEASE;  Surgeon: Dominica Severin, MD;  Location: [facility];  Service: Orthopedics;  Laterality: Bilateral;  left limited open carpal tunnel release. right carpal tunnel injection with 1cc of celestone and 2cc of marcaine.25%  . Carpal tunnel release  06/30/2012    Procedure: CARPAL TUNNEL RELEASE;  Surgeon: Dominica Severin, MD;  Location: [facility] OR;  Service: Orthopedics;  Laterality: Right;  RIGHT LIMITED OPEN CARPAL TUNNEL RELEASE    Family History  Problem Relation Age of Onset  . Diabetes Mother   . Lung cancer    . Colon cancer      neg. family history   . Colon polyps Sister    Social History:  reports that she quit smoking about a year ago. Her smoking use included Cigarettes. She has a 10 pack-year smoking history. She has never used smokeless tobacco. She reports that she does not drink alcohol or use illicit drugs.  Allergies: No Known Allergies  Medications Prior to Admission  Medication Sig Dispense Refill  . aspirin 81 MG tablet Take 81 mg by mouth daily.      . cloNIDine (CATAPRES) 0.1 MG tablet Take 0.1 mg by mouth 3 (three) times daily.      . cyclobenzaprine (FLEXERIL) 10 MG tablet Take 10 mg by mouth 3 (three) times daily as needed for muscle spasms.      [name] gabapentin (NEURONTIN) 300 MG capsule Take 600 mg by mouth 5 (five) times daily.      . hydrochlorothiazide (HYDRODIURIL) 25 MG tablet Take 25 mg by mouth daily.      [name] HYDROcodone-acetaminophen (NORCO) 10-325 MG per tablet Take 1 tablet by mouth every 6 (six) hours as needed.      . hydrOXYzine (ATARAX/VISTARIL) 50 MG tablet Take 1 tablet (50 mg total) by mouth at bedtime as needed (sleep).  30 tablet  0  . loratadine (CLARITIN) 10 MG tablet Take 1 tablet (10 mg total) by mouth daily.  30 tablet  0  . oxyCODONE-acetaminophen (PERCOCET) 10-325  MG per tablet Take 1 tablet by mouth every 4 (four) hours as needed for pain. As needed      . potassium chloride (K-DUR,KLOR-CON) 10 MEQ tablet Take 10 mEq by mouth daily.        Results for orders placed during the hospital encounter of 06/23/13 (from the past 48 hour(s))  BASIC METABOLIC PANEL     Status: Abnormal   Collection Time    06/21/13  2:00 PM      Result Value Range   Sodium 137  135 - 145 mEq/L   Potassium 3.7  3.5 - 5.1 mEq/L   Chloride 100  96 - 112 mEq/L   CO2 25  19 - 32 mEq/L   Glucose, Bld 101 (*) 70 - 99 mg/dL   BUN 10  6 - 23 mg/dL   Creatinine, Ser 1.61  0.50 - 1.10 mg/dL   Calcium 9.0  8.4 - 09.6 mg/dL   GFR calc non Af Amer >90  >90 mL/min   GFR calc Af Amer >90  >90 mL/min   Comment: (NOTE)     The  eGFR has been calculated using the CKD EPI equation.     This calculation has not been validated in all clinical situations.     eGFR's persistently <90 mL/min signify possible Chronic Kidney     Disease.  POCT HEMOGLOBIN-HEMACUE     Status: None   Collection Time    06/23/13  7:03 AM      Result Value Range   Hemoglobin 12.5  12.0 - 15.0 g/dL   No results found.  Review of Systems  Constitutional: Negative.   HENT: Negative.   Eyes: Negative.   Respiratory: Negative.   Cardiovascular: Negative.   Gastrointestinal: Negative.   Genitourinary: Negative.     Blood pressure 123/82, pulse 79, temperature 97.9 F (36.6 C), temperature source Oral, resp. rate 19, height 5\' 3"  (1.6 m), weight 51.256 kg (113 lb), SpO2 100.00%. Physical Exam left thumb CMC arthritis. History of bilateral carpal tunnel releases. Patient is neurovascularly intact. Patient has no signs of infection dystrophy or vascular compromise. She understands plans for surgical intervention.  [name].The patient is alert and oriented in no acute distress the patient complains of pain in the affected upper extremity.  The patient is noted to have a normal HEENT exam.  Lung fields show equal chest expansion and no shortness of breath  abdomen exam is nontender without distention.  Lower extremity examination does not show any fracture dislocation or blood clot symptoms.  Pelvis is stable neck and back are stable and nontender Assessment/Plan Plan for [facility] arthrodesis left thumb with autologous bone graft from the radius is necessary. All risk and benefits discussed.  [name].The patient is alert and oriented in no acute distress the patient complains of pain in the affected upper extremity.  The patient is noted to have a normal HEENT exam.  Lung fields show equal chest expansion and no shortness of breath  abdomen exam is nontender without distention.  Lower extremity examination does not show any fracture dislocation or blood clot  symptoms.  Pelvis is stable neck and back are stable and nontender  [name],[name] M 06/23/2013, 7:59 AM

## 2013-06-23 NOTE — Transfer of Care (Signed)
Immediate Anesthesia Transfer of Care Note  Patient: Amy Villa  Procedure(s) Performed: Procedure(s): LEFT CARPOMETACARPEL (CMC) FUSION OF THUMB WITH AUTOGRAFT FROM RADIUS AND REPAIR AS NECESSARY (Left)  Patient Location: PACU  Anesthesia Type:GA combined with regional for post-op pain  Level of Consciousness: awake, alert , oriented and patient cooperative  Airway & Oxygen Therapy: Patient Spontanous Breathing and Patient connected to face mask oxygen  Post-op Assessment: Report given to PACU RN and Post -op Vital signs reviewed and stable  Post vital signs: Reviewed and stable  Complications: No apparent anesthesia complications

## 2013-06-23 NOTE — Op Note (Signed)
See dictation#[id number] Amy Fohl MD

## 2013-06-23 NOTE — Anesthesia Procedure Notes (Addendum)
Anesthesia Regional Block:  Supraclavicular block  Pre-Anesthetic Checklist: ,, timeout performed, Correct Patient, Correct Site, Correct Laterality, Correct Procedure, Correct Position, site marked, Risks and benefits discussed,  Surgical consent,  Pre-op evaluation,  At surgeon's request and post-op pain management  Laterality: Left  Prep: chloraprep       Needles:   Needle Type: Echogenic Stimulator Needle      Needle Gauge: 22 and 22 G    Additional Needles:  Procedures: ultrasound guided (picture in chart) and nerve stimulator Supraclavicular block Narrative:  Start time: 06/23/2013 7:15 AM End time: 06/23/2013 7:20 AM Injection made incrementally with aspirations every 5 mL.  Performed by: Personally   Additional Notes: 25 cc 0.5% marcaine with 1:200 Epi and 5 mg decadron injected easily   Procedure Name: LMA Insertion Date/Time: 06/23/2013 8:06 AM Performed by: Zetha Kuhar Pre-anesthesia Checklist: Patient identified, Emergency Drugs available, Suction available and Patient being monitored Patient Re-evaluated:Patient Re-evaluated prior to inductionOxygen Delivery Method: Circle System Utilized Preoxygenation: Pre-oxygenation with 100% oxygen Intubation Type: IV induction Ventilation: Mask ventilation without difficulty LMA: LMA inserted LMA Size: 4.0 Number of attempts: 1 Airway Equipment and Method: bite block Placement Confirmation: positive ETCO2 Tube secured with: Tape Dental Injury: Teeth and Oropharynx as per pre-operative assessment

## 2013-06-23 NOTE — Progress Notes (Signed)
Assisted Dr. Joslin with left, ultrasound guided, supraclavicular block. Side rails up, monitors on throughout procedure. See vital signs in flow sheet. Tolerated Procedure well. 

## 2013-06-23 NOTE — Anesthesia Preprocedure Evaluation (Addendum)
Anesthesia Evaluation  Patient identified by MRN, date of birth, ID band Patient awake    Reviewed: Allergy & Precautions, NPO status   Airway Mallampati: II TM Distance: >3 FB     Dental  (+) Teeth Intact and Dental Advisory Given   Pulmonary former smoker,  breath sounds clear to auscultation        Cardiovascular hypertension, Pt. on medications Rhythm:Regular Rate:Normal     Neuro/Psych PSYCHIATRIC DISORDERS Depression  Neuromuscular disease    GI/Hepatic (+) Hepatitis -  Endo/Other    Renal/GU      Musculoskeletal   Abdominal   Peds  Hematology   Anesthesia Other Findings   Reproductive/Obstetrics                          Anesthesia Physical Anesthesia Plan  ASA: II  Anesthesia Plan: General   Post-op Pain Management:    Induction: Intravenous  Airway Management Planned: LMA  Additional Equipment:   Intra-op Plan:   Post-operative Plan:   Informed Consent: I have reviewed the patients History and Physical, chart, labs and discussed the procedure including the risks, benefits and alternatives for the proposed anesthesia with the patient or authorized representative who has indicated his/her understanding and acceptance.   Dental advisory given  Plan Discussed with: CRNA and Anesthesiologist  Anesthesia Plan Comments:         Anesthesia Quick Evaluation

## 2013-06-26 ENCOUNTER — Encounter (HOSPITAL_BASED_OUTPATIENT_CLINIC_OR_DEPARTMENT_OTHER): Payer: Self-pay | Admitting: Orthopedic Surgery

## 2013-06-26 NOTE — Op Note (Signed)
[name], EBARB NO.:  [id number]  MEDICAL RECORD NO.:  [id number]  LOCATION:                               FACILITY:  [facility]  PHYSICIAN:  Dionne Ano. Yobany Vroom, M.D.DATE OF BIRTH:  Jul 14, 1966  DATE OF PROCEDURE:  06/23/2013 DATE OF DISCHARGE:  06/23/2013                              OPERATIVE REPORT   PREOPERATIVE DIAGNOSIS:  Left thumb chronic carpometacarpal arthritis with failure of conservative management and end-stage collapse.  POSTOPERATIVE DIAGNOSIS:  Left thumb chronic carpometacarpal arthritis with failure of conservative management and end-stage collapse.  PROCEDURE: 1. Left thumb CMC fusion with autologous bone graft and Kirschner wire     fixation. 2. Superficial radial nerve neurolysis. 3. First dorsal compartment release limited in nature with     tenosynovectomy. 4. Stress radiography.  SURGEON:  Dionne Ano. Amanda Pea, M.D.  ASSISTANT:  None.  COMPLICATION:  None.  ANESTHESIA:  General with preoperative block.  TOURNIQUET TIME:  Less than 90 minutes.  INDICATIONS:  This is a pleasant 47 year old female, who presents for above-mentioned diagnosis given her young age, chronic pain, failure of conservative management and other issues.  I would recommend CMC fusion as opposed to arthroplasty.  She understands risks and benefits, as does her husband, and they desired to proceed.  All questions have been encouraged and answered preoperatively.  OPERATIVE PROCEDURE:  The patient was seen by myself and anesthesia and taken to the operating room after smooth induction of general anesthesia, time-out called.  Pre and postop check was complete.  She had been prepped and draped in usual sterile fashion, Betadine scrub and paint, and body parts well padded.  Once this done, evaluation of anesthesia revealed incredible crepitant and deformity at the [facility] joint. This was consistent with her diagnosis of course.  Following this, we performed a dorsal  radial incision.  Dissection was carried down through the skin, superficial radial nerve was identified, and underwent a complete neurolysis to sweep it out of inflammatory tissue in harm's way.  Superficial radial neurolysis was performed with [facility] instrument and following this, I gently retracted it and kept it in mind throughout the case.  Once this done, we then performed evaluation of the deep portion of the radial artery and swept this out of harm's way.  I then incised the capsule over the [facility] joint.  Prior to doing so, I performed a limited first dorsal compartment release and tenosynovectomy of the EPB and APL tendons, which had tenosynovitis findings about them.  Following this, I then performed very careful and cautious approach to the [facility] joint with capsular incision followed by folding back the capsule and preparing the bone with a cup and cone preparation utilizing rongeur, curette, and orthopedic instrument.  I was able to place two cancellous surfaces against themselves as describe for [facility] fusion in the position noted by [place] and [place].  At this time, I made a transverse incision over the distal radius just proximal to Lister's tubercle and dissected down the harvested bone graft.  Following this, I replaced it with allograft.  I irrigated copiously and closed the wound.  At the conclusion of case with hemostasis secured.  The autologous bone graft  was then placed in the fusion site.  Once this done, the thumb was reduced in the position as described by [place] and [employer].  Following this, two 0.062 K-wires were placed from distal metacarpal to proximal into and on the other side of the trapezium. This was checked under x-ray and all looked well.  This provided perfect stability and I felt very good about the compression of cancellous surfaces.  The patient was coapted quite well.  There were no complicating features.  There were no complications.  Following this,  I performed very careful and cautious irrigation.  Once this done, additional bone graft was placed followed by closure of the capsule. The capsule was closed and there were no issues.  The patient tolerated this well.  X-rays AP lateral and hyperpronated looked excellent.  We were quite pleased with this.  Thus, the patient underwent superficial radial nerve neurolysis as well as tenosynovectomy and first dorsal compartment release with associated CMC fusion of the thumb, joint, and autologous bone graft in the radius placed in the fusion site.  Fixation was by Kirschner wires, which were clipped below the skin surface.  Following this, we irrigated copiously. Performed a complex capsular closure.  Irrigation was then applied and the wounds were closed with Prolene.  Superficial radial nerve sat tension free and there were no issues.  She will be admitted overnight for IV antibiotics, and general postop observation, etc.  Should any problems occur, we will be immediately available.  I will see her back in the office in approximately 12 days for her followup and we will proceed according to standard protocol.     Dionne Ano. Amanda Pea, M.D.   ______________________________ Dionne Ano. Amanda Pea, M.D.    [facility]  D:  06/23/2013  T:  06/24/2013  Job:  [id number]

## 2013-07-10 ENCOUNTER — Ambulatory Visit (HOSPITAL_COMMUNITY): Payer: BC Managed Care – PPO

## 2013-07-10 ENCOUNTER — Encounter (HOSPITAL_COMMUNITY): Payer: BC Managed Care – PPO | Admitting: Anesthesiology

## 2013-07-10 ENCOUNTER — Ambulatory Visit (HOSPITAL_COMMUNITY): Payer: BC Managed Care – PPO | Admitting: Anesthesiology

## 2013-07-10 ENCOUNTER — Inpatient Hospital Stay (HOSPITAL_COMMUNITY)
Admission: RE | Admit: 2013-07-10 | Discharge: 2013-07-12 | DRG: 856 | Disposition: A | Discharge status: Home Health | Payer: BC Managed Care – PPO | Source: Ambulatory Visit | Attending: Orthopedic Surgery | Admitting: Orthopedic Surgery

## 2013-07-10 ENCOUNTER — Encounter (HOSPITAL_COMMUNITY)
Admission: RE | Disposition: A | Discharge status: Home Health | Payer: Self-pay | Source: Ambulatory Visit | Attending: Orthopedic Surgery

## 2013-07-10 ENCOUNTER — Encounter (HOSPITAL_COMMUNITY): Payer: Self-pay | Admitting: *Deleted

## 2013-07-10 DIAGNOSIS — L02519 Cutaneous abscess of unspecified hand: Secondary | ICD-10-CM | POA: Diagnosis present

## 2013-07-10 DIAGNOSIS — F1020 Alcohol dependence, uncomplicated: Secondary | ICD-10-CM | POA: Diagnosis present

## 2013-07-10 DIAGNOSIS — I10 Essential (primary) hypertension: Secondary | ICD-10-CM | POA: Diagnosis present

## 2013-07-10 DIAGNOSIS — M19049 Primary osteoarthritis, unspecified hand: Secondary | ICD-10-CM | POA: Diagnosis present

## 2013-07-10 DIAGNOSIS — K7010 Alcoholic hepatitis without ascites: Secondary | ICD-10-CM | POA: Diagnosis present

## 2013-07-10 DIAGNOSIS — E876 Hypokalemia: Secondary | ICD-10-CM | POA: Diagnosis present

## 2013-07-10 DIAGNOSIS — D72819 Decreased white blood cell count, unspecified: Secondary | ICD-10-CM | POA: Diagnosis present

## 2013-07-10 DIAGNOSIS — E871 Hypo-osmolality and hyponatremia: Secondary | ICD-10-CM | POA: Diagnosis present

## 2013-07-10 DIAGNOSIS — F3289 Other specified depressive episodes: Secondary | ICD-10-CM | POA: Diagnosis present

## 2013-07-10 DIAGNOSIS — M869 Osteomyelitis, unspecified: Secondary | ICD-10-CM | POA: Diagnosis present

## 2013-07-10 DIAGNOSIS — M1990 Unspecified osteoarthritis, unspecified site: Secondary | ICD-10-CM | POA: Diagnosis present

## 2013-07-10 DIAGNOSIS — Y838 Other surgical procedures as the cause of abnormal reaction of the patient, or of later complication, without mention of misadventure at the time of the procedure: Secondary | ICD-10-CM | POA: Diagnosis present

## 2013-07-10 DIAGNOSIS — M189 Osteoarthritis of first carpometacarpal joint, unspecified: Secondary | ICD-10-CM | POA: Diagnosis present

## 2013-07-10 DIAGNOSIS — IMO0002 Reserved for concepts with insufficient information to code with codable children: Secondary | ICD-10-CM | POA: Diagnosis present

## 2013-07-10 DIAGNOSIS — F1010 Alcohol abuse, uncomplicated: Secondary | ICD-10-CM | POA: Diagnosis present

## 2013-07-10 DIAGNOSIS — D649 Anemia, unspecified: Secondary | ICD-10-CM | POA: Diagnosis present

## 2013-07-10 DIAGNOSIS — L03019 Cellulitis of unspecified finger: Secondary | ICD-10-CM | POA: Diagnosis present

## 2013-07-10 DIAGNOSIS — F329 Major depressive disorder, single episode, unspecified: Secondary | ICD-10-CM

## 2013-07-10 DIAGNOSIS — A4901 Methicillin susceptible Staphylococcus aureus infection, unspecified site: Secondary | ICD-10-CM | POA: Diagnosis present

## 2013-07-10 DIAGNOSIS — F1110 Opioid abuse, uncomplicated: Secondary | ICD-10-CM | POA: Diagnosis present

## 2013-07-10 DIAGNOSIS — F17200 Nicotine dependence, unspecified, uncomplicated: Secondary | ICD-10-CM | POA: Diagnosis present

## 2013-07-10 DIAGNOSIS — J189 Pneumonia, unspecified organism: Secondary | ICD-10-CM | POA: Diagnosis present

## 2013-07-10 DIAGNOSIS — Z8614 Personal history of Methicillin resistant Staphylococcus aureus infection: Secondary | ICD-10-CM

## 2013-07-10 DIAGNOSIS — K859 Acute pancreatitis without necrosis or infection, unspecified: Secondary | ICD-10-CM | POA: Diagnosis present

## 2013-07-10 DIAGNOSIS — T8140XA Infection following a procedure, unspecified, initial encounter: Principal | ICD-10-CM | POA: Diagnosis present

## 2013-07-10 HISTORY — PX: HARDWARE REMOVAL: SHX979

## 2013-07-10 HISTORY — PX: INCISION AND DRAINAGE OF WOUND: SHX1803

## 2013-07-10 LAB — CBC
HCT: 36.4 % (ref 36.0–46.0)
Hemoglobin: 12.6 g/dL (ref 12.0–15.0)
MCH: 31.7 pg (ref 26.0–34.0)
MCHC: 34.6 g/dL (ref 30.0–36.0)
MCV: 91.5 fL (ref 78.0–100.0)
Platelets: 189 10*3/uL (ref 150–400)
RBC: 3.98 MIL/uL (ref 3.87–5.11)
RDW: 12.8 % (ref 11.5–15.5)
WBC: 7.4 10*3/uL (ref 4.0–10.5)

## 2013-07-10 LAB — GRAM STAIN

## 2013-07-10 LAB — BASIC METABOLIC PANEL
BUN: 5 mg/dL — ABNORMAL LOW (ref 6–23)
CO2: 30 mEq/L (ref 19–32)
Calcium: 9.6 mg/dL (ref 8.4–10.5)
Creatinine, Ser: 0.47 mg/dL — ABNORMAL LOW (ref 0.50–1.10)
GFR calc non Af Amer: >90 mL/min (ref >90)
Glucose, Bld: 90 mg/dL (ref 70–99)
Potassium: 2.8 mEq/L — ABNORMAL LOW (ref 3.5–5.1)
Sodium: 134 mEq/L — ABNORMAL LOW (ref 135–145)

## 2013-07-10 LAB — SEDIMENTATION RATE: Sed Rate: 55 mm/hr — ABNORMAL HIGH (ref 0–22)

## 2013-07-10 LAB — C-REACTIVE PROTEIN: CRP: 15.7 mg/dL — ABNORMAL HIGH (ref <0.60)

## 2013-07-10 SURGERY — IRRIGATION AND DEBRIDEMENT EXTREMITY
Anesthesia: General | Laterality: Left

## 2013-07-10 SURGERY — IRRIGATION AND DEBRIDEMENT WOUND
Anesthesia: Regional | Site: Wrist | Laterality: Left

## 2013-07-10 MED ORDER — VANCOMYCIN HCL 1000 MG IV SOLR
1000.0000 mg | INTRAVENOUS | Status: DC | PRN
  Administered 2013-07-10: 1000 mg via INTRAVENOUS

## 2013-07-10 MED ORDER — FENTANYL CITRATE 0.05 MG/ML IJ SOLN
INTRAMUSCULAR | Status: DC | PRN
  Administered 2013-07-10 (×2): 50 ug via INTRAVENOUS
  Administered 2013-07-10: 100 ug via INTRAVENOUS
  Administered 2013-07-10 (×3): 50 ug via INTRAVENOUS

## 2013-07-10 MED ORDER — LIDOCAINE HCL (PF) 2 % IJ SOLN
INTRAMUSCULAR | Status: DC | PRN
  Administered 2013-07-10: 10 mL via INTRADERMAL

## 2013-07-10 MED ORDER — LACTATED RINGERS IV SOLN
INTRAVENOUS | Status: DC
  Administered 2013-07-10 (×2): via INTRAVENOUS

## 2013-07-10 MED ORDER — VANCOMYCIN HCL 1000 MG IV SOLR
INTRAVENOUS | Status: DC | PRN
  Administered 2013-07-10: 1000 mg

## 2013-07-10 MED ORDER — MIDAZOLAM HCL 2 MG/2ML IJ SOLN
INTRAMUSCULAR | Status: AC
  Filled 2013-07-10: qty 2

## 2013-07-10 MED ORDER — SODIUM CHLORIDE 0.9 % IR SOLN
Status: DC | PRN
  Administered 2013-07-10: 9000 mL

## 2013-07-10 MED ORDER — GENTAMICIN SULFATE 40 MG/ML IJ SOLN
240.0000 mg | Freq: Once | INTRAMUSCULAR | Status: AC
  Administered 2013-07-10: 240 mg
  Filled 2013-07-10: qty 6

## 2013-07-10 MED ORDER — 0.9 % SODIUM CHLORIDE (POUR BTL) OPTIME
TOPICAL | Status: DC | PRN
  Administered 2013-07-10: 2000 mL

## 2013-07-10 MED ORDER — MIDAZOLAM HCL 5 MG/5ML IJ SOLN
INTRAMUSCULAR | Status: DC | PRN
  Administered 2013-07-10 (×3): 2 mg via INTRAVENOUS

## 2013-07-10 MED ORDER — PROMETHAZINE HCL 25 MG/ML IJ SOLN
6.2500 mg | INTRAMUSCULAR | Status: DC | PRN
  Administered 2013-07-10: 12.5 mg via INTRAVENOUS

## 2013-07-10 MED ORDER — FENTANYL CITRATE 0.05 MG/ML IJ SOLN
INTRAMUSCULAR | Status: AC
  Filled 2013-07-10: qty 2

## 2013-07-10 MED ORDER — MORPHINE SULFATE 2 MG/ML IJ SOLN
1.0000 mg | INTRAMUSCULAR | Status: DC | PRN
Start: 2013-07-10 — End: 2013-07-11
  Administered 2013-07-10 (×2): 2 mg via INTRAVENOUS
  Filled 2013-07-10 (×2): qty 1

## 2013-07-10 MED ORDER — PIPERACILLIN-TAZOBACTAM 3.375 G IVPB 30 MIN
INTRAVENOUS | Status: DC | PRN
  Administered 2013-07-10: 3.375 g via INTRAVENOUS

## 2013-07-10 MED ORDER — LIDOCAINE HCL (CARDIAC) 20 MG/ML IV SOLN
INTRAVENOUS | Status: DC | PRN
  Administered 2013-07-10: 75 mg via INTRAVENOUS

## 2013-07-10 MED ORDER — VANCOMYCIN HCL 1000 MG IV SOLR
INTRAVENOUS | Status: AC
  Filled 2013-07-10: qty 1000

## 2013-07-10 MED ORDER — PROPOFOL 10 MG/ML IV BOLUS
INTRAVENOUS | Status: DC | PRN
  Administered 2013-07-10: 200 mg via INTRAVENOUS

## 2013-07-10 MED ORDER — LIDOCAINE HCL 2 % IJ SOLN
INTRAMUSCULAR | Status: AC
  Filled 2013-07-10: qty 20

## 2013-07-10 MED ORDER — FENTANYL CITRATE 0.05 MG/ML IJ SOLN: 25.0000 ug | INTRAMUSCULAR | Status: DC | PRN

## 2013-07-10 MED ORDER — PROPOFOL 10 MG/ML IV BOLUS
INTRAVENOUS | Status: AC
  Filled 2013-07-10: qty 20

## 2013-07-10 MED ORDER — ONDANSETRON HCL 4 MG/2ML IJ SOLN
INTRAMUSCULAR | Status: AC
  Filled 2013-07-10: qty 2

## 2013-07-10 MED ORDER — LIDOCAINE HCL (CARDIAC) 20 MG/ML IV SOLN
INTRAVENOUS | Status: AC
  Filled 2013-07-10: qty 5

## 2013-07-10 MED ORDER — ROPIVACAINE HCL 5 MG/ML IJ SOLN
INTRAMUSCULAR | Status: DC | PRN
  Administered 2013-07-10: 10 mL via PERINEURAL

## 2013-07-10 MED ORDER — FENTANYL CITRATE 0.05 MG/ML IJ SOLN
INTRAMUSCULAR | Status: AC
  Filled 2013-07-10: qty 5

## 2013-07-10 MED ORDER — PIPERACILLIN-TAZOBACTAM 3.375 G IVPB 30 MIN
3.3750 g | INTRAVENOUS | Status: DC
  Filled 2013-07-10: qty 50

## 2013-07-10 MED ORDER — DEXAMETHASONE SODIUM PHOSPHATE 10 MG/ML IJ SOLN
INTRAMUSCULAR | Status: AC
  Filled 2013-07-10: qty 1

## 2013-07-10 MED ORDER — DEXAMETHASONE SODIUM PHOSPHATE 10 MG/ML IJ SOLN
INTRAMUSCULAR | Status: DC | PRN
  Administered 2013-07-10: 10 mg via INTRAVENOUS

## 2013-07-10 MED ORDER — ONDANSETRON HCL 4 MG/2ML IJ SOLN
INTRAMUSCULAR | Status: DC | PRN
  Administered 2013-07-10: 4 mg via INTRAVENOUS

## 2013-07-10 MED ORDER — ROPIVACAINE HCL 5 MG/ML IJ SOLN
INTRAMUSCULAR | Status: AC
  Filled 2013-07-10: qty 30

## 2013-07-10 SURGICAL SUPPLY — 42 items
BAG SPEC THK2 15X12 ZIP CLS (MISCELLANEOUS) ×1
BAG ZIPLOCK 12X15 (MISCELLANEOUS) ×2 IMPLANT
BANDAGE ELASTIC 4 VELCRO ST LF (GAUZE/BANDAGES/DRESSINGS) ×3 IMPLANT
BANDAGE GAUZE ELAST BULKY 4 IN (GAUZE/BANDAGES/DRESSINGS) ×1 IMPLANT
BLADE SURG SZ10 CARB STEEL (BLADE) ×2 IMPLANT
BNDG GAUZE ELAST 4 BULKY (GAUZE/BANDAGES/DRESSINGS) ×2 IMPLANT
CUFF TOURN SGL QUICK 18 (TOURNIQUET CUFF) ×2 IMPLANT
DRAIN PENROSE 18X1/2 LTX STRL (DRAIN) ×1 IMPLANT
DRAPE SURG 17X11 SM STRL (DRAPES) ×2 IMPLANT
DRSG ADAPTIC 3X8 NADH LF (GAUZE/BANDAGES/DRESSINGS) ×1 IMPLANT
ELECT REM PT RETURN 9FT ADLT (ELECTROSURGICAL)
ELECTRODE REM PT RTRN 9FT ADLT (ELECTROSURGICAL) ×1 IMPLANT
GAUZE XEROFORM 5X9 LF (GAUZE/BANDAGES/DRESSINGS) ×1 IMPLANT
GLOVE BIO SURGEON STRL SZ8 (GLOVE) ×3 IMPLANT
GOWN STRL REIN XL XLG (GOWN DISPOSABLE) ×2 IMPLANT
KIT BASIN OR (CUSTOM PROCEDURE TRAY) ×2 IMPLANT
KIT STIMULAN RAPID CURE 5CC (Orthopedic Implant) ×1 IMPLANT
MANIFOLD NEPTUNE II (INSTRUMENTS) ×2 IMPLANT
NDL SAFETY ECLIPSE 18X1.5 (NEEDLE) IMPLANT
NEEDLE HYPO 18GX1.5 SHARP (NEEDLE) ×2
PACK LOWER EXTREMITY WL (CUSTOM PROCEDURE TRAY) ×2 IMPLANT
PAD ABD 8X10 STRL (GAUZE/BANDAGES/DRESSINGS) ×2 IMPLANT
PAD CAST 4YDX4 CTTN HI CHSV (CAST SUPPLIES) ×1 IMPLANT
PADDING CAST COTTON 4X4 STRL (CAST SUPPLIES) ×4
POSITIONER SURGICAL ARM (MISCELLANEOUS) ×2 IMPLANT
SET IRRIG Y TYPE TUR BLADDER L (SET/KITS/TRAYS/PACK) ×1 IMPLANT
SOL PREP POV-IOD 16OZ 10% (MISCELLANEOUS) ×2 IMPLANT
SOL PREP PROV IODINE SCRUB 4OZ (MISCELLANEOUS) ×2 IMPLANT
SPLINT FIBERGLASS 4X15 (CAST SUPPLIES) ×1 IMPLANT
SPONGE GAUZE 4X4 12PLY (GAUZE/BANDAGES/DRESSINGS) ×2 IMPLANT
SUT PROLENE 3 0 PS 2 (SUTURE) ×1 IMPLANT
SUT PROLENE 5 0 P 3 (SUTURE) ×2 IMPLANT
SUT VIC AB 1 CT1 27 (SUTURE)
SUT VIC AB 1 CT1 27XBRD ANTBC (SUTURE) ×1 IMPLANT
SUT VIC AB 2-0 CT1 27 (SUTURE)
SUT VIC AB 2-0 CT1 27XBRD (SUTURE) ×1 IMPLANT
SYR 20CC LL (SYRINGE) ×2 IMPLANT
SYR CONTROL 10ML LL (SYRINGE) ×2 IMPLANT
SYRINGE 10CC LL (SYRINGE) ×1 IMPLANT
SYSTEM CHEST DRAIN TLS 7FR (DRAIN) ×1 IMPLANT
TOWEL OR 17X26 10 PK STRL BLUE (TOWEL DISPOSABLE) ×2 IMPLANT
TUBE ANAEROBIC SPECIMEN COL (MISCELLANEOUS) ×4 IMPLANT

## 2013-07-10 NOTE — Anesthesia Preprocedure Evaluation (Addendum)
Anesthesia Evaluation  Patient identified by MRN, date of birth, ID band Patient awake    Reviewed: Allergy & Precautions, H&P , NPO status , Patient's Chart, lab work & pertinent test results  Airway Mallampati: II TM Distance: >3 FB Neck ROM: Full    Dental no notable dental hx.    Pulmonary pneumonia -, unresolved, former smoker,  Small left sided infiltrate on CXR breath sounds clear to auscultation  Pulmonary exam normal       Cardiovascular hypertension, Pt. on medications Rhythm:Regular Rate:Normal     Neuro/Psych PSYCHIATRIC DISORDERS Depression  Neuromuscular disease negative psych ROS   GI/Hepatic negative GI ROS, (+)     substance abuse  alcohol use, Hepatitis -, Toxin Related  Endo/Other  negative endocrine ROS  Renal/GU negative Renal ROS  negative genitourinary   Musculoskeletal negative musculoskeletal ROS (+)   Abdominal   Peds negative pediatric ROS (+)  Hematology negative hematology ROS (+)   Anesthesia Other Findings   Reproductive/Obstetrics negative OB ROS                         Anesthesia Physical Anesthesia Plan  ASA: III and emergent  Anesthesia Plan: Regional   Post-op Pain Management:    Induction: Intravenous  Airway Management Planned:   Additional Equipment:   Intra-op Plan:   Post-operative Plan: Extubation in OR  Informed Consent: I have reviewed the patients History and Physical, chart, labs and discussed the procedure including the risks, benefits and alternatives for the proposed anesthesia with the patient or authorized representative who has indicated his/her understanding and acceptance.   Dental advisory given  Plan Discussed with: CRNA  Anesthesia Plan Comments: (Axillary block with GA backup in setting of pneumonia.)       Anesthesia Quick Evaluation

## 2013-07-10 NOTE — Progress Notes (Addendum)
Paged Dr Council Mechanic and Dr Amanda Pea about abnormal CXR. Pt's O2 sats on room are was 94% at 1500 today. Message left with Debbie at [facility] orthopedic office about CXR results.  1815  Patient was updated on time she would go to holding area. Estimate  In about one hour.

## 2013-07-10 NOTE — Anesthesia Procedure Notes (Addendum)
Anesthesia Regional Block:  Axillary brachial plexus block  Pre-Anesthetic Checklist: ,, timeout performed, Correct Patient, Correct Site, Correct Laterality, Correct Procedure, Correct Position, site marked, Risks and benefits discussed, Surgical consent,  Pre-op evaluation,  At surgeon's request  Laterality: Left and Upper  Prep: chloraprep       Needles:  Injection technique: Single-shot  Needle Type: Stimulator Needle - 80      Needle Gauge: 21 and 21 G    Additional Needles: Axillary brachial plexus block Narrative:   Performed by: Personally  Anesthesiologist: denenny  Additional Notes: Attempted left axillary block. After 20 minutes, she had a partial block. Plan general.

## 2013-07-10 NOTE — Preoperative (Signed)
Beta Blockers   Reason not to administer Beta Blockers:Not Applicable 

## 2013-07-10 NOTE — Transfer of Care (Signed)
Immediate Anesthesia Transfer of Care Note  Patient: Amy Villa  Procedure(s) Performed: Procedure(s) (LRB): IRRIGATION AND DEBRIDEMENT WOUND left wrist   (Left) HARDWARE REMOVAL (Left)  Patient Location: PACU  Anesthesia Type: General  Level of Consciousness: sedated, patient cooperative and responds to stimulation  Airway & Oxygen Therapy: Patient Spontanous Breathing and Patient connected to face mask oxgen  Post-op Assessment: Report given to PACU RN and Post -op Vital signs reviewed and stable  Post vital signs: Reviewed and stable  Complications: No apparent anesthesia complications

## 2013-07-10 NOTE — Consult Note (Signed)
[facility] Medical Consultation  Amy Villa ZOX:[id number] DOB: July 26, 1965 DOA: 07/10/2013 PCP: Herb Grays, MD   Requesting physician: Dr. Dominica Severin (orthopedic surgery) Date of consultation: 07/10/2013 Reason for consultation: Multiple medical problems  Impression/Recommendations Active Problems:   ALCOHOLISM   Acute alcoholic hepatitis   PANCREATITIS   Depressive disorder, not elsewhere classified   Bone infection of left hand    Pneumonia -Continue vancomycin+ Zosyn -Start DuoNeb's -Start Mucinex DM -Start pulmonary toilet -Start flutter valve  Alcoholism  -Place on CIWA withdrawal assessment every 4 hours, if patient exhibits signs or symptoms of alcohol withdrawal institute CIWA protocol  Nicotine dependent  -Start nicotine patch  Cellulitis/abscess left hand -Patient on antibiotics which would serve dual purpose of controlling pneumonia and Lt hand cellulitis/abscess. -Per orthopedic surgery  Hyponatremia -D5- 1/2 saline with potassium secondary to patient being hyponatremic -Start D5-NS with potassium @ 49ml/hr DC in a.m. when patient eating and drinking well  Hypokalemia -Repleting through IV continue to monitor  Depressive disorder -After patient fully awake in the a.m. we'll discuss starting an SSRI      I will followup again tomorrow. Please contact me if I can be of assistance in the meanwhile. Thank you for this consultation.  Chief Complaint: Pneumonia, infection left thumb  HPI: 47 yo WF PMHx HTN,Hx MRSA infection, S/P multiple lumbar back surgeries, S/P carpal tunnel release surgery. Patient presented to Dr. Dominica Severin (orthopedic surgery) c/o Infection left hand and wrist/CMC joint S/P post CMC fusion 13 days ago. History of fevers in the last week with x-ray evidence of pneumonia. Per Dr.Gramig patient noticed a fever Thursday (5 days ago). She took her brace off and notice some redness and a slight bloody  discharge about her wound region over the weekend. She presented to my office and I aspirated what I felt to be pus fluid in the area of her incision. She also had pins that were protruding (1 of 2). I aspirated infectious material and felt that an I and D. was absolutely necessary to night. I discussed with her all issues. I have discussed with her that I would recommend immediate irrigation and debridement. Patient states has not been on any antibiotics in the last 3 months.    Review of Systems:  Negative CP, positive mild SOB, positive productive cough, negative hand pain, negative hemoptysis negative hematemesis negative abdominal pain negative melena negative hematochezia   Procedure I and D. of left hand by Dr. Amanda Pea or disease orthopedic surgery) 07/10/2013  CXR 07/10/2013 Left perihilar infiltrate consistent with pneumonia.   Antibiotics Zosyn 12/22>> Vancomycin 12/22>>    Past Medical History  Diagnosis Date  . Arthritis     neck  . Carpal tunnel syndrome on both sides 02/2012  . Wears partial dentures     lower  . Dental crowns present     x 2 upper front  . History of MRSA infection   . Fluid retention   . Hypertension   . Contact lens/glasses fitting     wears contacts or glasses   Past Surgical History  Procedure Laterality Date  . Laparoscopic vaginal hysterectomy  02/24/2010  . Leep      x 2  . Laparoscopy  06/2010    for adhesions  . Lumbar disc surgery  03/23/2003    left L5-S1 discectomy with microdissection  . Lumbar disc surgery  05/23/2003    redo discectomy L5-S1 with microdissection  . Carpal tunnel release  03/17/2012  Procedure: CARPAL TUNNEL RELEASE;  Surgeon: Dominica Severin, MD;  Location: [facility];  Service: Orthopedics;  Laterality: Bilateral;  left limited open carpal tunnel release. right carpal tunnel injection with 1cc of celestone and 2cc of marcaine.25%  . Carpal tunnel release  06/30/2012    Procedure: CARPAL TUNNEL  RELEASE;  Surgeon: Dominica Severin, MD;  Location: [facility] OR;  Service: Orthopedics;  Laterality: Right;  RIGHT LIMITED OPEN CARPAL TUNNEL RELEASE  . Carpometacarpal (cmc) fusion of thumb Left 06/23/2013    Procedure: LEFT CARPOMETACARPEL (CMC) FUSION OF THUMB WITH AUTOGRAFT FROM RADIUS AND REPAIR AS NECESSARY;  Surgeon: Dominica Severin, MD;  Location: [facility];  Service: Orthopedics;  Laterality: Left;   Social History:  One/2 PPD x15 years, She has never used smokeless tobacco. Drinks for to 5 beers last weekend, does not drink alcohol or use illicit drugs.  No Known Allergies Family History  Problem Relation Age of Onset  . Diabetes Mother   . Lung cancer    . Colon cancer      neg. family history  . Colon polyps Sister     Prior to Admission medications   Medication Sig Start Date End Date Taking? Authorizing Provider  cloNIDine (CATAPRES) 0.1 MG tablet Take 0.1 mg by mouth 3 (three) times daily. 02/14/13  Yes Nanine Means, NP  gabapentin (NEURONTIN) 300 MG capsule Take 600 mg by mouth 5 (five) times daily. 02/14/13  Yes Nanine Means, NP  hydrochlorothiazide (HYDRODIURIL) 25 MG tablet Take 25 mg by mouth daily. 02/14/13  Yes Nanine Means, NP  hydrOXYzine (ATARAX/VISTARIL) 50 MG tablet Take 50 mg by mouth at bedtime as needed for anxiety (sleep). 02/14/13  Yes Nanine Means, NP  ibuprofen (ADVIL,MOTRIN) 200 MG tablet Take 400 mg by mouth every 6 (six) hours as needed for fever or moderate pain.   Yes Historical Provider, MD  loratadine (CLARITIN) 10 MG tablet Take 1 tablet (10 mg total) by mouth daily. 02/14/13  Yes Nanine Means, NP  oxyCODONE-acetaminophen (PERCOCET) 10-325 MG per tablet Take 1 tablet by mouth every 4 (four) hours as needed for pain. 06/23/13   Dominica Severin, MD   Physical Exam: Blood pressure 138/85, pulse 88, temperature 98.1 F (36.7 C), temperature source Oral, resp. rate 18, height 5\' 3"  (1.6 m), weight 52.617 kg (116 lb), SpO2 94.00%. Filed Vitals:    07/10/13 1503 07/10/13 2300  BP: 138/85   Pulse: 88   Temp: 98.9 F (37.2 C) 98.1 F (36.7 C)  TempSrc: Oral   Resp: 18   Height: 5\' 3"  (1.6 m)   Weight: 52.617 kg (116 lb)   SpO2: 94%      General:  Sleepy but arousable, A./O. x4 once awake, NAD  Eyes: Pupils equal round reactive to light and accommodation  Neck: Negative JVD, negative lymphadenopathy  Cardiovascular: Regular rhythm and rate, negative murmurs rubs or gallops, DP/PT pulse 2+ bilateral  Respiratory: Diffuse rhonchi  Abdomen: Soft, nontender, nondistended, plus bowel sound  Skin: Negative rash, or lesions  Musculoskeletal: Left hand wrapped in a protective dressing, patient able to wiggle fingers, capillary refill one plus  Neurologic: Cranial nerves II through XII intact, tongue/the midline, extremity strength 5/5, sensation intact throughout, did not ambulate patient  Labs on Admission:  Basic Metabolic Panel:  Recent Labs Lab 07/10/13 1555  NA 134*  K 2.8*  CL 91*  CO2 30  GLUCOSE 90  BUN 5*  CREATININE 0.47*  CALCIUM 9.6   Liver Function Tests: No results  found for this basename: AST, ALT, ALKPHOS, BILITOT, PROT, ALBUMIN,  in the last 168 hours No results found for this basename: LIPASE, AMYLASE,  in the last 168 hours No results found for this basename: AMMONIA,  in the last 168 hours CBC:  Recent Labs Lab 07/10/13 1555  WBC 7.4  HGB 12.6  HCT 36.4  MCV 91.5  PLT 189   Cardiac Enzymes: No results found for this basename: CKTOTAL, CKMB, CKMBINDEX, TROPONINI,  in the last 168 hours BNP: No components found with this basename: POCBNP,  CBG: No results found for this basename: GLUCAP,  in the last 168 hours  Radiological Exams on Admission: Dg Chest 2 View  07/10/2013   CLINICAL DATA:  Hypertension.  EXAM: CHEST  2 VIEW  COMPARISON:  06/24/2012.  FINDINGS: Left perihilar infiltrate noted consistent with pneumonia. This is a new finding. Heart size and pulmonary vascularity  normal. No pleural effusion or pneumothorax. No acute bony abnormality.  IMPRESSION: Left perihilar infiltrate consistent with pneumonia.  These results will be called to the ordering clinician or representative by the Radiologist Assistant, and communication documented in the PACS Dashboard.   Electronically Signed   By: Maisie Fus  Register   On: 07/10/2013 16:10    EKG:   Time spent: 90 minutes  Shaena Parkerson, Roselind Messier [facility] Pager [phone]  If 7PM-7AM, please contact night-coverage [email or web address] Password [facility] 07/10/2013, 11:40 PM

## 2013-07-10 NOTE — Op Note (Signed)
See dictation #[id number] Amanda Pea MD

## 2013-07-10 NOTE — H&P (Signed)
Amy Villa is an 47 y.o. female.   Chief Complaint: Infection left hand and wrist/CMC joint status post CMC fusion 13 days ago. History of fevers in the last week with x-ray evidence of pneumonia HPI: Patient is a pleasant female who underwent surgical intervention for her [facility] joint left thumb 13 days ago. She noticed a fever Thursday (5 days ago). She took her brace off and notice some redness and a slight bloody discharge about her wound region over the weekend. She presented to my office and I aspirated what I felt to be power fluid in the area of her incision. She also had pins that were protruding (1 of 2). I aspirated infectious material and felt that an I and D. was absolutely necessary to night. I discussed with her all issues. I have discussed with her that I would recommend immediate irrigation and debridement.  Given her history of recent fevers I ordered a chest x-ray which has evidence of pneumonia.  I discussed this with anesthesiology and we will plan for block anesthetic.  I will ask for a hospitalist consult for her respiratory issues.  I should note she did appear to take off her bandage and manipulate the incision and wound despite my efforts at getting her to keep this clean and dry and call me for any problems. 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 I do feel this as well as the recent infection and high fevers/pneumonia has contributed to her predicament.  She understands this and the need for I&D  Past Medical History  Diagnosis Date  . Arthritis     neck  . Carpal tunnel syndrome on both sides 02/2012  . Wears partial dentures     lower  . Dental crowns present     x 2 upper front  . History of MRSA infection   . Fluid retention   . Hypertension   . Contact lens/glasses fitting     wears contacts or glasses    Past Surgical History  Procedure Laterality Date  . Laparoscopic vaginal hysterectomy  02/24/2010  . Leep      x 2  .  Laparoscopy  06/2010    for adhesions  . Lumbar disc surgery  03/23/2003    left L5-S1 discectomy with microdissection  . Lumbar disc surgery  05/23/2003    redo discectomy L5-S1 with microdissection  . Carpal tunnel release  03/17/2012    Procedure: CARPAL TUNNEL RELEASE;  Surgeon: Dominica Severin, MD;  Location: [facility];  Service: Orthopedics;  Laterality: Bilateral;  left limited open carpal tunnel release. right carpal tunnel injection with 1cc of celestone and 2cc of marcaine.25%  . Carpal tunnel release  06/30/2012    Procedure: CARPAL TUNNEL RELEASE;  Surgeon: Dominica Severin, MD;  Location: [facility] OR;  Service: Orthopedics;  Laterality: Right;  RIGHT LIMITED OPEN CARPAL TUNNEL RELEASE  . Carpometacarpal (cmc) fusion of thumb Left 06/23/2013    Procedure: LEFT CARPOMETACARPEL (CMC) FUSION OF THUMB WITH AUTOGRAFT FROM RADIUS AND REPAIR AS NECESSARY;  Surgeon: Dominica Severin, MD;  Location: [facility];  Service: Orthopedics;  Laterality: Left;    Family History  Problem Relation Age of Onset  . Diabetes Mother   . Lung cancer    . Colon cancer      neg. family history  . Colon polyps Sister    Social History:  reports that she quit smoking about 12 months ago. Her smoking use included Cigarettes.  She has a 10 pack-year smoking history. She has never used smokeless tobacco. She reports that she does not drink alcohol or use illicit drugs.  Allergies: No Known Allergies  Medications Prior to Admission  Medication Sig Dispense Refill  . cloNIDine (CATAPRES) 0.1 MG tablet Take 0.1 mg by mouth 3 (three) times daily.      [name] gabapentin (NEURONTIN) 300 MG capsule Take 600 mg by mouth 5 (five) times daily.      . hydrochlorothiazide (HYDRODIURIL) 25 MG tablet Take 25 mg by mouth daily.      . hydrOXYzine (ATARAX/VISTARIL) 50 MG tablet Take 50 mg by mouth at bedtime as needed for anxiety (sleep).      [name] ibuprofen (ADVIL,MOTRIN) 200 MG tablet Take 400 mg by mouth  every 6 (six) hours as needed for fever or moderate pain.      [name] loratadine (CLARITIN) 10 MG tablet Take 1 tablet (10 mg total) by mouth daily.  30 tablet  0  . [DISCONTINUED] hydrOXYzine (ATARAX/VISTARIL) 50 MG tablet Take 1 tablet (50 mg total) by mouth at bedtime as needed (sleep).  30 tablet  0  . oxyCODONE-acetaminophen (PERCOCET) 10-325 MG per tablet Take 1 tablet by mouth every 4 (four) hours as needed for pain.  68 tablet  0    Results for orders placed during the hospital encounter of 07/10/13 (from the past 48 hour(s))  BASIC METABOLIC PANEL     Status: Abnormal   Collection Time    07/10/13  3:55 PM      Result Value Range   Sodium 134 (*) 135 - 145 mEq/L   Potassium 2.8 (*) 3.5 - 5.1 mEq/L   Chloride 91 (*) 96 - 112 mEq/L   CO2 30  19 - 32 mEq/L   Glucose, Bld 90  70 - 99 mg/dL   BUN 5 (*) 6 - 23 mg/dL   Creatinine, Ser 4.09 (*) 0.50 - 1.10 mg/dL   Calcium 9.6  8.4 - 81.1 mg/dL   GFR calc non Af Amer >90  >90 mL/min   GFR calc Af Amer >90  >90 mL/min   Comment: (NOTE)     The eGFR has been calculated using the CKD EPI equation.     This calculation has not been validated in all clinical situations.     eGFR's persistently <90 mL/min signify possible Chronic Kidney     Disease.  CBC     Status: None   Collection Time    07/10/13  3:55 PM      Result Value Range   WBC 7.4  4.0 - 10.5 K/uL   RBC 3.98  3.87 - 5.11 MIL/uL   Hemoglobin 12.6  12.0 - 15.0 g/dL   HCT 91.4  78.2 - 95.6 %   MCV 91.5  78.0 - 100.0 fL   MCH 31.7  26.0 - 34.0 pg   MCHC 34.6  30.0 - 36.0 g/dL   RDW 21.3  08.6 - 57.8 %   Platelets 189  150 - 400 K/uL  SEDIMENTATION RATE     Status: Abnormal   Collection Time    07/10/13  3:55 PM      Result Value Range   Sed Rate 55 (*) 0 - 22 mm/hr   Dg Chest 2 View  07/10/2013   CLINICAL DATA:  Hypertension.  EXAM: CHEST  2 VIEW  COMPARISON:  06/24/2012.  FINDINGS: Left perihilar infiltrate noted consistent with pneumonia. This is a new finding. Heart  size and  pulmonary vascularity normal. No pleural effusion or pneumothorax. No acute bony abnormality.  IMPRESSION: Left perihilar infiltrate consistent with pneumonia.  These results will be called to the ordering clinician or representative by the Radiologist Assistant, and communication documented in the PACS Dashboard.   Electronically Signed   By: Maisie Fus  Register   On: 07/10/2013 16:10    Review of Systems  HENT: Negative.   Eyes: Negative.   Respiratory: Positive for cough. Negative for shortness of breath.   Cardiovascular: Negative.   Gastrointestinal: Negative.   Genitourinary: Negative.   Musculoskeletal: Back pain: white female alert and oriented. Her left CMC region has swelling and redness of the CMC joint. I aspirated this today and sent for culture. She has intact EPL and FPL function there is no signs of necrotizing fasciitis. The distal radius bone graft site l.       Please see exam  Neurological: Negative.   Endo/Heme/Allergies: Negative.     Blood pressure 138/85, pulse 88, temperature 98.9 F (37.2 C), temperature source Oral, resp. rate 18, height 5\' 3"  (1.6 m), weight 52.617 kg (116 lb), SpO2 94.00%. Physical Exam the patient is a white female alert and oriented in no acute distress. She complains of cough. The patient has a temperature as noted above. The patient has redness and swelling over her incision site. I previous the aspirated this. She has infectious fluid in aspirate. She has one of 2 pins which is protruding.  The patient has intact EPL and FPL function. She has intact elbow shoulder and forearm function. There's no evidence of necrotizing fasciitis. There is no evidence of gas in the soft tissue. Her x-rays look excellent from my office.  She has a cough. She has dry skin over her for head. Lower extremity examination is benign. abdomen is nontender nondistended and soft  chest has a cough but no evidence of wheeze. She is not short of breath.  HEENT is  otherwise unremarkable  She has classic  signs of an early infection after a CMC fusion. She also has evidence of pneumonia based on her chest x-ray Assessment/Plan #1 pneumonia #2 infection left CMC joint 13 days after fusion We'll plan for irrigation and debridement. We'll plan for cultures. I last hospitalist to see her for evaluation of her and pneumonia. I discussed with her the relevant issues and the plans for the surgical intervention. I will assess her stability and likely plan for pin removal likely one of 2 as necessary. I discussed my concerns in regards to her care.  Hopefully we can salvage a decent fusion.   I do feel that rest antibiotics prolonged in nature and adherence to the recommendations is going to be paramount to try and get her a good result.  I do feel she is absolutely overdone it and fortunately overstep the recommendations that were given postoperatively.  She certainly is a hard-working female that I do feel that her situation is going to require absolute rest and attention to detail and she understands this.  We'll proceed accordingly  .Marland KitchenWe are planning surgery for your upper extremity. The risk and benefits of surgery include risk of bleeding infection anesthesia damage to normal structures and failure of the surgery to accomplish its intended goals of relieving symptoms and restoring function with this in mind we'll going to proceed. I have specifically discussed with the patient the pre-and postoperative regime and the does and don'ts and risk and benefits in great detail. Risk and benefits of surgery also  include risk of dystrophy chronic nerve pain failure of the healing process to go onto completion and other inherent risks of surgery The relavent the pathophysiology of the disease/injury process, as well as the alternatives for treatment and postoperative course of action has been discussed in great detail with the patient who desires to proceed.  We will do  everything in our power to help you (the patient) restore function to the upper extremity. Is a pleasure to see this patient today.  Karen Chafe 07/10/2013, 7:52 PM

## 2013-07-11 ENCOUNTER — Encounter (HOSPITAL_COMMUNITY): Payer: Self-pay | Admitting: Orthopedic Surgery

## 2013-07-11 DIAGNOSIS — D72819 Decreased white blood cell count, unspecified: Secondary | ICD-10-CM | POA: Diagnosis present

## 2013-07-11 DIAGNOSIS — IMO0002 Reserved for concepts with insufficient information to code with codable children: Secondary | ICD-10-CM | POA: Diagnosis present

## 2013-07-11 DIAGNOSIS — J189 Pneumonia, unspecified organism: Secondary | ICD-10-CM | POA: Diagnosis present

## 2013-07-11 DIAGNOSIS — M1990 Unspecified osteoarthritis, unspecified site: Secondary | ICD-10-CM | POA: Diagnosis present

## 2013-07-11 DIAGNOSIS — T8140XA Infection following a procedure, unspecified, initial encounter: Principal | ICD-10-CM

## 2013-07-11 DIAGNOSIS — F17200 Nicotine dependence, unspecified, uncomplicated: Secondary | ICD-10-CM | POA: Diagnosis present

## 2013-07-11 DIAGNOSIS — D649 Anemia, unspecified: Secondary | ICD-10-CM | POA: Diagnosis present

## 2013-07-11 DIAGNOSIS — M869 Osteomyelitis, unspecified: Secondary | ICD-10-CM

## 2013-07-11 DIAGNOSIS — F1110 Opioid abuse, uncomplicated: Secondary | ICD-10-CM | POA: Diagnosis present

## 2013-07-11 LAB — URINE DRUGS OF ABUSE SCREEN W ALC, ROUTINE (REF LAB)
Amphetamine Screen, Ur: NEGATIVE
Barbiturate Quant, Ur: NEGATIVE
Creatinine,U: 30.5 mg/dL
Ethyl Alcohol: <10 mg/dL (ref <10)
Marijuana Metabolite: NEGATIVE
Opiate Screen, Urine: POSITIVE — AB

## 2013-07-11 LAB — CBC WITH DIFFERENTIAL/PLATELET
Basophils Absolute: 0 10*3/uL (ref 0.0–0.1)
Eosinophils Absolute: 0 10*3/uL (ref 0.0–0.7)
Eosinophils Relative: 0 % (ref 0–5)
Hemoglobin: 11.2 g/dL — ABNORMAL LOW (ref 12.0–15.0)
Lymphocytes Relative: 23 % (ref 12–46)
Lymphs Abs: 0.6 10*3/uL — ABNORMAL LOW (ref 0.7–4.0)
MCHC: 33.2 g/dL (ref 30.0–36.0)
Monocytes Absolute: 0.2 10*3/uL (ref 0.1–1.0)
Neutro Abs: 1.9 10*3/uL (ref 1.7–7.7)
Neutrophils Relative %: 70 % (ref 43–77)
Platelets: 166 10*3/uL (ref 150–400)
RBC: 3.63 MIL/uL — ABNORMAL LOW (ref 3.87–5.11)
WBC: 2.7 10*3/uL — ABNORMAL LOW (ref 4.0–10.5)

## 2013-07-11 LAB — COMPREHENSIVE METABOLIC PANEL
Alkaline Phosphatase: 97 U/L (ref 39–117)
BUN: 5 mg/dL — ABNORMAL LOW (ref 6–23)
CO2: 29 mEq/L (ref 19–32)
Calcium: 9 mg/dL (ref 8.4–10.5)
Chloride: 99 mEq/L (ref 96–112)
Creatinine, Ser: 0.49 mg/dL — ABNORMAL LOW (ref 0.50–1.10)
GFR calc Af Amer: >90 mL/min (ref >90)
GFR calc non Af Amer: >90 mL/min (ref >90)
Glucose, Bld: 224 mg/dL — ABNORMAL HIGH (ref 70–99)
Sodium: 138 mEq/L (ref 135–145)
Total Protein: 6.7 g/dL (ref 6.0–8.3)

## 2013-07-11 LAB — MAGNESIUM: Magnesium: 2 mg/dL (ref 1.5–2.5)

## 2013-07-11 MED ORDER — IPRATROPIUM BROMIDE 0.02 % IN SOLN
0.5000 mg | Freq: Two times a day (BID) | RESPIRATORY_TRACT | Status: DC
  Administered 2013-07-11 – 2013-07-12 (×3): 0.5 mg via RESPIRATORY_TRACT
  Filled 2013-07-11 (×3): qty 2.5

## 2013-07-11 MED ORDER — ONDANSETRON HCL 4 MG/2ML IJ SOLN: 4.0000 mg | Freq: Four times a day (QID) | INTRAMUSCULAR | Status: DC | PRN

## 2013-07-11 MED ORDER — PROMETHAZINE HCL 25 MG RE SUPP: 12.5000 mg | Freq: Four times a day (QID) | RECTAL | Status: DC | PRN

## 2013-07-11 MED ORDER — DEXTROSE 5 % IV SOLN
500.0000 mg | Freq: Four times a day (QID) | INTRAVENOUS | Status: DC | PRN
  Filled 2013-07-11: qty 5

## 2013-07-11 MED ORDER — ALBUTEROL SULFATE (5 MG/ML) 0.5% IN NEBU
2.5000 mg | INHALATION_SOLUTION | Freq: Two times a day (BID) | RESPIRATORY_TRACT | Status: DC
  Administered 2013-07-11 – 2013-07-12 (×3): 2.5 mg via RESPIRATORY_TRACT
  Filled 2013-07-11 (×3): qty 0.5

## 2013-07-11 MED ORDER — MORPHINE SULFATE 2 MG/ML IJ SOLN: 1.0000 mg | INTRAMUSCULAR | Status: DC | PRN

## 2013-07-11 MED ORDER — ALPRAZOLAM 0.5 MG PO TABS
0.5000 mg | ORAL_TABLET | Freq: Four times a day (QID) | ORAL | Status: DC | PRN
  Administered 2013-07-11: 0.5 mg via ORAL
  Filled 2013-07-11: qty 1

## 2013-07-11 MED ORDER — ONDANSETRON HCL 4 MG PO TABS
4.0000 mg | ORAL_TABLET | Freq: Four times a day (QID) | ORAL | Status: DC | PRN
  Administered 2013-07-11: 4 mg via ORAL
  Filled 2013-07-11: qty 1

## 2013-07-11 MED ORDER — FAMOTIDINE 20 MG PO TABS
20.0000 mg | ORAL_TABLET | Freq: Two times a day (BID) | ORAL | Status: DC | PRN
  Filled 2013-07-11: qty 1

## 2013-07-11 MED ORDER — DOCUSATE SODIUM 100 MG PO CAPS
100.0000 mg | ORAL_CAPSULE | Freq: Two times a day (BID) | ORAL | Status: DC
  Administered 2013-07-11 – 2013-07-12 (×4): 100 mg via ORAL

## 2013-07-11 MED ORDER — PIPERACILLIN-TAZOBACTAM 3.375 G IVPB
3.3750 g | Freq: Three times a day (TID) | INTRAVENOUS | Status: DC
  Filled 2013-07-11 (×2): qty 50

## 2013-07-11 MED ORDER — HYDROCHLOROTHIAZIDE 25 MG PO TABS
25.0000 mg | ORAL_TABLET | Freq: Every day | ORAL | Status: DC
  Administered 2013-07-11: 25 mg via ORAL
  Filled 2013-07-11: qty 1

## 2013-07-11 MED ORDER — KCL IN DEXTROSE-NACL 40-5-0.45 MEQ/L-%-% IV SOLN
INTRAVENOUS | Status: DC
  Administered 2013-07-11: 01:00:00 via INTRAVENOUS
  Filled 2013-07-11: qty 1000

## 2013-07-11 MED ORDER — IPRATROPIUM BROMIDE 0.02 % IN SOLN: 0.5000 mg | Freq: Four times a day (QID) | RESPIRATORY_TRACT | Status: DC | PRN

## 2013-07-11 MED ORDER — VANCOMYCIN HCL IN DEXTROSE 750-5 MG/150ML-% IV SOLN
750.0000 mg | Freq: Two times a day (BID) | INTRAVENOUS | Status: DC
  Administered 2013-07-11 – 2013-07-12 (×3): 750 mg via INTRAVENOUS
  Filled 2013-07-11 (×4): qty 150

## 2013-07-11 MED ORDER — DM-GUAIFENESIN ER 30-600 MG PO TB12
1.0000 | ORAL_TABLET | Freq: Two times a day (BID) | ORAL | Status: DC
  Administered 2013-07-11 – 2013-07-12 (×4): 1 via ORAL
  Filled 2013-07-11 (×5): qty 1

## 2013-07-11 MED ORDER — METHOCARBAMOL 500 MG PO TABS
500.0000 mg | ORAL_TABLET | Freq: Four times a day (QID) | ORAL | Status: DC | PRN
  Administered 2013-07-11 – 2013-07-12 (×3): 500 mg via ORAL
  Filled 2013-07-11 (×3): qty 1

## 2013-07-11 MED ORDER — IPRATROPIUM BROMIDE 0.02 % IN SOLN
0.5000 mg | Freq: Four times a day (QID) | RESPIRATORY_TRACT | Status: DC
  Filled 2013-07-11: qty 2.5

## 2013-07-11 MED ORDER — CLONIDINE HCL 0.1 MG PO TABS
0.1000 mg | ORAL_TABLET | Freq: Three times a day (TID) | ORAL | Status: DC
  Administered 2013-07-11 (×3): 0.1 mg via ORAL
  Filled 2013-07-11 (×6): qty 1

## 2013-07-11 MED ORDER — NICOTINE 21 MG/24HR TD PT24
21.0000 mg | MEDICATED_PATCH | Freq: Every day | TRANSDERMAL | Status: DC
  Administered 2013-07-11 – 2013-07-12 (×2): 21 mg via TRANSDERMAL
  Filled 2013-07-11 (×2): qty 1

## 2013-07-11 MED ORDER — KCL IN DEXTROSE-NACL 40-5-0.9 MEQ/L-%-% IV SOLN
INTRAVENOUS | Status: DC
  Administered 2013-07-11: 03:00:00 via INTRAVENOUS
  Filled 2013-07-11 (×4): qty 1000

## 2013-07-11 MED ORDER — VITAMIN C 500 MG PO TABS
1000.0000 mg | ORAL_TABLET | Freq: Every day | ORAL | Status: DC
  Administered 2013-07-11 – 2013-07-12 (×2): 1000 mg via ORAL
  Filled 2013-07-11 (×2): qty 2

## 2013-07-11 MED ORDER — ALBUTEROL SULFATE (5 MG/ML) 0.5% IN NEBU
2.5000 mg | INHALATION_SOLUTION | Freq: Four times a day (QID) | RESPIRATORY_TRACT | Status: DC
  Filled 2013-07-11: qty 0.5

## 2013-07-11 MED ORDER — GABAPENTIN 300 MG PO CAPS
600.0000 mg | ORAL_CAPSULE | Freq: Every day | ORAL | Status: DC
Start: 2013-07-11 — End: 2013-07-12
  Administered 2013-07-11 – 2013-07-12 (×8): 600 mg via ORAL
  Filled 2013-07-11 (×11): qty 2

## 2013-07-11 MED ORDER — HYDROXYZINE HCL 50 MG PO TABS
50.0000 mg | ORAL_TABLET | Freq: Every evening | ORAL | Status: DC | PRN
  Filled 2013-07-11: qty 1

## 2013-07-11 MED ORDER — OXYCODONE HCL 5 MG PO TABS
5.0000 mg | ORAL_TABLET | ORAL | Status: DC | PRN
  Administered 2013-07-11 (×3): 10 mg via ORAL
  Administered 2013-07-11: 5 mg via ORAL
  Administered 2013-07-12 (×4): 10 mg via ORAL
  Filled 2013-07-11 (×4): qty 2
  Filled 2013-07-11: qty 1
  Filled 2013-07-11 (×3): qty 2

## 2013-07-11 MED ORDER — ALBUTEROL SULFATE (5 MG/ML) 0.5% IN NEBU: 2.5000 mg | INHALATION_SOLUTION | Freq: Four times a day (QID) | RESPIRATORY_TRACT | Status: DC | PRN

## 2013-07-11 MED ORDER — PIPERACILLIN-TAZOBACTAM 3.375 G IVPB
3.3750 g | Freq: Three times a day (TID) | INTRAVENOUS | Status: DC
  Administered 2013-07-11 – 2013-07-12 (×4): 3.375 g via INTRAVENOUS
  Filled 2013-07-11 (×6): qty 50

## 2013-07-11 NOTE — Care Management Note (Signed)
    Page 1 of 1   07/12/2013     10:48:52 AM   CARE MANAGEMENT NOTE 07/12/2013  Patient:  Amy Villa, Amy Villa   Account Number:  [id number]  Date Initiated:  07/11/2013  Documentation initiated by:  Colleen Can  Subjective/Objective Assessment:   dx infected left hand     Action/Plan:   CM spoke with patient. Plans are for her to return to her home wherespouse will be caregiver. Pt is ambulatory.  CM will follow for HH orders.   Anticipated DC Date:  07/14/2013   Anticipated DC Plan:  HOME W HOME HEALTH SERVICES      DC Planning Services  CM consult      [facility] Choice  HOME HEALTH   Choice offered to / List presented to:  C-1 Patient        HH arranged  HH-1 RN  HH-10 DISEASE MANAGEMENT      Status of service:  Completed, signed off Medicare Important Message given?   (If response is "NO", the following Medicare IM given date fields will be blank) Date Medicare IM given:   Date Additional Medicare IM given:    Discharge Disposition:  HOME W HOME HEALTH SERVICES  Per UR Regulation:  Reviewed for med. necessity/level of care/duration of stay  If discussed at Long Length of Stay Meetings, dates discussed:    Comments:  07-12-13 Amy Ishihara RN CM 1000 Spoke with patient at bedside. Wants [facility] for [facility] services, anticipate home with long term IV abx, PICC in place. Awaiting final cultures to know antibiotic at d/c.

## 2013-07-11 NOTE — Progress Notes (Signed)
ANTIBIOTIC CONSULT NOTE - INITIAL  Pharmacy Consult for Vancomycin/Zosyn Indication: Hand infection/s/p I&D  No Known Allergies  Patient Measurements: Height: 5\' 3"  (160 cm) Weight: 116 lb (52.617 kg) IBW/kg (Calculated) : 52.4   Vital Signs: Temp: 98.8 F (37.1 C) (12/22 2345) Temp src: Oral (12/22 1503) BP: 132/87 mmHg (12/22 2345) Pulse Rate: 88 (12/22 1503) Intake/Output from previous day: 12/22 0701 - 12/23 0700 In: 1350 [P.O.:150; I.V.:1200] Out: 200 [Urine:200] Intake/Output from this shift: Total I/O In: 1350 [P.O.:150; I.V.:1200] Out: 200 [Urine:200]  Labs:  Recent Labs  07/10/13 1555  WBC 7.4  HGB 12.6  PLT 189  CREATININE 0.47*   Estimated Creatinine Clearance: 71.9 ml/min (by C-G formula based on Cr of 0.47). No results found for this basename: VANCOTROUGH, VANCOPEAK, VANCORANDOM, GENTTROUGH, GENTPEAK, GENTRANDOM, TOBRATROUGH, TOBRAPEAK, TOBRARND, AMIKACINPEAK, AMIKACINTROU, AMIKACIN,  in the last 72 hours   Microbiology: Recent Results (from the past 720 hour(s))  GRAM STAIN     Status: None   Collection Time    07/10/13  9:36 PM      Result Value Range Status   Specimen Description HAND LEFT WOUND 1   Final   Special Requests PATIENT ON FOLLOWING ZOSYN   Final   Gram Stain     Final   Value: ABUNDANT WBC PRESENT, PREDOMINANTLY PMN     FEW GRAM POSITIVE COCCI     Gram Stain Report Called to,Read Back By and Verified With: KFINCH/PACU RN AT 2335 ON [id number] BY DLONG   Report Status 07/10/2013 FINAL   Final  GRAM STAIN     Status: None   Collection Time    07/10/13  9:36 PM      Result Value Range Status   Specimen Description HAND LEFT WOUND 2   Final   Special Requests PATIENT ON FOLLOWING ZOSYN   Final   Gram Stain     Final   Value: ABUNDANT WBC PRESENT, PREDOMINANTLY PMN     RARE GRAM POSITIVE COCCI     Gram Stain Report Called to,Read Back By and Verified With: [facility] RN AT 2335 ON [id number] BY DLONG   Report Status 07/10/2013 FINAL   Final     Medical History: Past Medical History  Diagnosis Date  . Arthritis     neck  . Carpal tunnel syndrome on both sides 02/2012  . Wears partial dentures     lower  . Dental crowns present     x 2 upper front  . History of MRSA infection   . Fluid retention   . Hypertension   . Contact lens/glasses fitting     wears contacts or glasses    Medications:  Scheduled:  . cloNIDine  0.1 mg Oral TID  . docusate sodium  100 mg Oral BID  . gabapentin  600 mg Oral 5 X Daily  . hydrochlorothiazide  25 mg Oral Daily  . vitamin C  1,000 mg Oral Daily   Infusions:  . dextrose 5 % and 0.45 % NaCl with KCl 40 mEq/L     Assessment: 47 yo s/p surgery of hand 13 days ago presents with fever, redness of wound.  MD aspirated wound and felt pt should immediately have I&D.  Vancomycin and Zosyn for infection.  Goal of Therapy:  Vancomycin trough level 15-20 mcg/ml  Plan:   Zosyn 3.375 Gm IV q8h EI infusion  Vancomcyin 1Gm x1 then 750mg  q12h  F/U SCr/levels as needed  Susanne Greenhouse R 07/11/2013,12:39 AM

## 2013-07-11 NOTE — Progress Notes (Signed)
[facility] PROGRESS NOTE  Amy Villa AVW:[id number] DOB: 1966/05/31 DOA: 07/10/2013  PCP: Herb Grays, MD  Brief HPI: 47 yo WF PMHx HTN,Hx MRSA infection, S/P multiple lumbar back surgeries, S/P carpal tunnel release surgery. Patient presented to Dr. Dominica Severin (orthopedic surgery) c/o Infection left hand and wrist/CMC joint S/P post CMC fusion 13 days prior to admission. History of fevers in the last week with x-ray evidence of pneumonia. Per Dr.Gramig patient noticed a fever Thursday (5 days ago). She took her brace off and notice some redness and a slight bloody discharge about her wound region over the weekend. She was hospitalized for debridement and IV antibiotics.  Past medical history:  Past Medical History  Diagnosis Date  . Arthritis     neck  . Carpal tunnel syndrome on both sides 02/2012  . Wears partial dentures     lower  . Dental crowns present     x 2 upper front  . History of MRSA infection   . Fluid retention   . Hypertension   . Contact lens/glasses fitting     wears contacts or glasses    Primary Service: Dr. Amanda Pea with ortho  Procedures: I and D and removal of deep hardware, left thumb CMC joint. This was a radical tenosynovectomy, hardware removal, and a joint debridement including synovectomy of the joint extensive in nature. On 12/22  Antibiotics: Vanc and Zosyn  Subjective: Patient complains of pain in left hand. Also has cough with wheezing. Some pleuritic chest pain when she takes a deep breath.  Objective: Vital Signs  Filed Vitals:   07/11/13 0631 07/11/13 1011 07/11/13 1032 07/11/13 1413  BP: 148/90  119/82 117/73  Pulse: 81  91 92  Temp: 98.5 F (36.9 C)  98.8 F (37.1 C) 98.3 F (36.8 C)  TempSrc: Oral     Resp: 16  16 16   Height:      Weight:      SpO2: 97% 95% 99% 92%    Intake/Output Summary (Last 24 hours) at 07/11/13 1536 Last data filed at 07/11/13 1328  Gross per 24 hour  Intake   2290 ml  Output   1925  ml  Net    365 ml   Filed Weights   07/10/13 1503  Weight: 52.617 kg (116 lb)    General appearance: alert, cooperative, appears stated age and no distress Resp: decreased air entry bilaterally with crackles at bases and wheezing. Cardio: regular rate and rhythm, S1, S2 normal, no murmur, click, rub or gallop GI: soft, non-tender; bowel sounds normal; no masses,  no organomegaly Extremities: extremities normal, atraumatic, no cyanosis or edema Neurologic: Alert and oriented X 3, no focal deficits  Lab Results:  Basic Metabolic Panel:  Recent Labs Lab 07/10/13 1555 07/11/13 0434  NA 134* 138  K 2.8* 3.5  CL 91* 99  CO2 30 29  GLUCOSE 90 224*  BUN 5* 5*  CREATININE 0.47* 0.49*  CALCIUM 9.6 9.0  MG  --  2.0   Liver Function Tests:  Recent Labs Lab 07/11/13 0434  AST 25  ALT 14  ALKPHOS 97  BILITOT 0.2*  PROT 6.7  ALBUMIN 3.1*   CBC:  Recent Labs Lab 07/10/13 1555 07/11/13 0434  WBC 7.4 2.7*  NEUTROABS  --  1.9  HGB 12.6 11.2*  HCT 36.4 33.7*  MCV 91.5 92.8  PLT 189 166     Recent Results (from the past 240 hour(s))  ANAEROBIC CULTURE     Status:  None   Collection Time    07/10/13  9:36 PM      Result Value Range Status   Specimen Description HAND LEFT WOUND 1   Final   Special Requests PATIENT ON FOLLOWING ZOSYN   Final   Gram Stain     Final   Value: ABUNDANT WBC PRESENT, PREDOMINANTLY PMN     FEW GRAM POSITIVE COCCI IN PAIRS     Performed by [facility] Gram Stain Report Called to,Read Back By and Verified With: Gram Stain Report Called to,Read Back By and Verified With:  KFINCH/PACU RN AT 2335 ON 07/10/13 BY DLONG     Performed at [facility]   Culture     Final   Value: NO ANAEROBES ISOLATED; CULTURE IN PROGRESS FOR 5 DAYS     Performed at [facility]   Report Status PENDING   Incomplete  WOUND CULTURE     Status: None   Collection Time    07/10/13  9:36 PM      Result Value Range Status   Specimen Description  HAND LEFT WOUND 1   Final   Special Requests PATIENT ON FOLLOWING ZOSYN   Final   Gram Stain     Final   Value: ABUNDANT WBC PRESENT, PREDOMINANTLY PMN     NO ORGANISMS SEEN     FEW GRAM POSITIVE COCCI IN PAIRS     Performed by [facility] Gram Stain Report Called to,Read Back By and Verified With: Gram Stain Report Called to,Read Back By and Verified With:  KFINCH/PACU RN AT 2335 ON 07/10/13 BY DLONG     Performed at [facility]   Culture PENDING   Incomplete   Report Status PENDING   Incomplete  GRAM STAIN     Status: None   Collection Time    07/10/13  9:36 PM      Result Value Range Status   Specimen Description HAND LEFT WOUND 1   Final   Special Requests PATIENT ON FOLLOWING ZOSYN   Final   Gram Stain     Final   Value: ABUNDANT WBC PRESENT, PREDOMINANTLY PMN     FEW GRAM POSITIVE COCCI     Gram Stain Report Called to,Read Back By and Verified With: KFINCH/PACU RN AT 2335 ON [id number] BY DLONG   Report Status 07/10/2013 FINAL   Final  ANAEROBIC CULTURE     Status: None   Collection Time    07/10/13  9:36 PM      Result Value Range Status   Specimen Description HAND LEFT WOUND 2   Final   Special Requests PATIENT ON FOLLOWING ZOSYN   Final   Gram Stain     Final   Value: ABUNDANT WBC PRESENT, PREDOMINANTLY PMN     RARE GRAM POSITIVE COCCI IN PAIRS     Performed by [facility] Gram Stain Report Called to,Read Back By and Verified With: Gram Stain Report Called to,Read Back By and Verified With:  [facility] RN AT 2335 ON 07/12/13 BY DLONG     Performed at [facility]   Culture     Final   Value: NO ANAEROBES ISOLATED; CULTURE IN PROGRESS FOR 5 DAYS     Performed at [facility]   Report Status PENDING   Incomplete  WOUND CULTURE     Status: None   Collection Time    07/10/13  9:36 PM      Result Value Range Status  Specimen Description HAND LEFT WOUND 2   Final   Special Requests PATIENT ON FOLLOWING ZOSYN   Final   Gram Stain      Final   Value: ABUNDANT WBC PRESENT, PREDOMINANTLY PMN     NO SQUAMOUS EPITHELIAL CELLS SEEN     RARE GRAM POSITIVE COCCI IN PAIRS     Performed by [facility] Gram Stain Report Called to,Read Back By and Verified With: Gram Stain Report Called to,Read Back By and Verified With:  [facility] RN AT 2335 ON 07/10/13 BY DLONG     Performed at [facility]   Culture PENDING   Incomplete   Report Status PENDING   Incomplete  GRAM STAIN     Status: None   Collection Time    07/10/13  9:36 PM      Result Value Range Status   Specimen Description HAND LEFT WOUND 2   Final   Special Requests PATIENT ON FOLLOWING ZOSYN   Final   Gram Stain     Final   Value: ABUNDANT WBC PRESENT, PREDOMINANTLY PMN     RARE GRAM POSITIVE COCCI     Gram Stain Report Called to,Read Back By and Verified With: [facility] RN AT 2335 ON [id number] BY DLONG   Report Status 07/10/2013 FINAL   Final  FUNGUS CULTURE W SMEAR     Status: None   Collection Time    07/10/13  9:36 PM      Result Value Range Status   Specimen Description HAND LEFT WOUND 2   Final   Special Requests PATIENT ON FOLLOWING ZOSYN   Final   Fungal Smear     Final   Value: NO YEAST OR FUNGAL ELEMENTS SEEN     Performed at [facility]   Culture     Final   Value: CULTURE IN PROGRESS FOR FOUR WEEKS     Performed at [facility]   Report Status PENDING   Incomplete      Studies/Results: Dg Chest 2 View  07/10/2013   CLINICAL DATA:  Hypertension.  EXAM: CHEST  2 VIEW  COMPARISON:  06/24/2012.  FINDINGS: Left perihilar infiltrate noted consistent with pneumonia. This is a new finding. Heart size and pulmonary vascularity normal. No pleural effusion or pneumothorax. No acute bony abnormality.  IMPRESSION: Left perihilar infiltrate consistent with pneumonia.  These results will be called to the ordering clinician or representative by the Radiologist Assistant, and communication documented in the PACS Dashboard.   Electronically  Signed   By: Maisie Fus  Register   On: 07/10/2013 16:10    Medications:  Scheduled: . ipratropium  0.5 mg Nebulization BID   And  . albuterol  2.5 mg Nebulization BID  . cloNIDine  0.1 mg Oral TID  . dextromethorphan-guaiFENesin  1 tablet Oral BID  . docusate sodium  100 mg Oral BID  . gabapentin  600 mg Oral 5 X Daily  . hydrochlorothiazide  25 mg Oral Daily  . nicotine  21 mg Transdermal Daily  . piperacillin-tazobactam (ZOSYN)  IV  3.375 g Intravenous Q8H  . vancomycin  750 mg Intravenous Q12H  . vitamin C  1,000 mg Oral Daily   Continuous: . dextrose 5 % and 0.9 % NaCl with KCl 40 mEq/L 75 mL/hr at 07/11/13 2956   OZH:YQMVHQION, ALPRAZolam, famotidine, hydrOXYzine, ipratropium, methocarbamol (ROBAXIN) IV, methocarbamol, morphine injection, ondansetron (ZOFRAN) IV, ondansetron, oxyCODONE, promethazine  Assessment/Plan:  Active Problems:   ALCOHOLISM   Acute alcoholic hepatitis  PANCREATITIS   Depressive disorder, not elsewhere classified   Bone infection of left hand   Hyponatremia   Hypokalemia   Nicotine dependence   CAP (community acquired pneumonia)   Pneumonia most likely HCAP She was in [facility] recently for procedure per Dr. Carlos Levering note. Continue vancomycin and Zosyn. Continue DuoNeb's, Mucinex DM.   Alcoholism  Placed on CIWA withdrawal assessment every 4 hours, if patient exhibits signs or symptoms of alcohol withdrawal institute CIWA protocol   Nicotine dependent  Continue nicotine patch   Cellulitis/abscess left hand  Patient on antibiotics which would serve dual purpose of controlling pneumonia and Lt hand cellulitis/abscess. Management Per orthopedic surgery. Wound cultures pending.  History of HTN Continue home medications. Hold HCTZ.   Hyponatremia  Improved with hydration.   Hypokalemia  Improved.   Depressive disorder  Stable. Continue home medications.    LOS: 1 day   [facility]  [facility] Pager [phone] 07/11/2013,  3:36 PM  If 8PM-8AM, please contact night-coverage at [email or web address], password [facility]

## 2013-07-11 NOTE — Progress Notes (Signed)
Patient ID: Amy Villa, female   DOB: April 23, 1966, 47 y.o.   MRN: [id number] Patient is doing well. I discussed with her all issues and findings. Her intraoperative findings were significant for involvement of the joint and bone. That's I feel she will need presumptive treatment of osteomyelitis. We will await the organism and move forward. I appreciate Cliffton Asters of infectious disease and his recommendations.  As discussed with Dr. Orvan Falconer I agree with PICC line and at least 6 weeks of antibiotics and intravenous route.  Given the smoking habitus and other issues that surround the patient I feel we need to be quite aggressive.  She removed her drain.  She and I had long conversation about all issues plans and concerns.  Our first goal is to eradicate the infection. I discussed her that people sometimes go on to fuse in the face of the circumstances. Like to wait and see and it will be a process which will be somewhat laborious and require strict attention to detail.  I will make sure that we're available for her. I've asked her not to remove her bandage or manipulate her wound in any way.  She understands all issues.  I certainly appreciate the hospitalist consult  Patient understands our plan is to find an organism, find a right antibiotic, obtain IV access for prolonged period of time and move forward with discharge to home once these measures have been met and plan for home health.  Chrles Selley M.D.

## 2013-07-11 NOTE — Progress Notes (Signed)
TC from lab states that results for Wound #1 abundant WBC predominantly polynuclear few gram positive cocci. Wound # 2 abundant WBC, predominantly poly nuclear with rare gram positive cocci

## 2013-07-11 NOTE — Anesthesia Postprocedure Evaluation (Signed)
  Anesthesia Post-op Note  Patient: Amy Villa  Procedure(s) Performed: Procedure(s) (LRB): IRRIGATION AND DEBRIDEMENT WOUND left wrist   (Left) HARDWARE REMOVAL (Left)  Patient Location: PACU  Anesthesia Type: GA combined with regional for post-op pain  Level of Consciousness: awake and alert   Airway and Oxygen Therapy: Patient Spontanous Breathing  Post-op Pain: mild  Post-op Assessment: Post-op Vital signs reviewed, Patient's Cardiovascular Status Stable, Respiratory Function Stable, Patent Airway and No signs of Nausea or vomiting  Last Vitals:  Filed Vitals:   07/10/13 2345  BP: 132/87  Pulse:   Temp: 37.1 C  Resp:     Post-op Vital Signs: stable   Complications: No apparent anesthesia complications. No pain. No dyspnea.

## 2013-07-11 NOTE — Consult Note (Signed)
[facility]    Date of Admission:  07/10/2013           Day 2 vancomycin        Day 2 piperacillin tazobactam       Reason for Consult: Left thumb wound infection with osteomyelitis and possible healthcare associated pneumonia    Referring Physician: Dr. Onalee Hua  Principal Problem:   Bone infection of left hand Active Problems:   CMC arthritis, thumb, degenerative   Leukopenia   ALCOHOLISM   Acute alcoholic hepatitis   PANCREATITIS   Depressive disorder, not elsewhere classified   Hyponatremia   Hypokalemia   Nicotine dependence   HCAP (healthcare-associated pneumonia)   DJD (degenerative joint disease)   DDD (degenerative disc disease)   Anemia   Narcotic abuse   . ipratropium  0.5 mg Nebulization BID   And  . albuterol  2.5 mg Nebulization BID  . cloNIDine  0.1 mg Oral TID  . dextromethorphan-guaiFENesin  1 tablet Oral BID  . docusate sodium  100 mg Oral BID  . gabapentin  600 mg Oral 5 X Daily  . nicotine  21 mg Transdermal Daily  . piperacillin-tazobactam (ZOSYN)  IV  3.375 g Intravenous Q8H  . vancomycin  750 mg Intravenous Q12H  . vitamin C  1,000 mg Oral Daily    Recommendations: 1. Continue current antibiotics pending operative cultures 2. PICC placement   Assessment: She has developed postoperative wound infection with early bone involvement. I talked to her and her husband about the need for prolonged IV antibiotic therapy and she agrees with PICC placement. I will continue vancomycin and piperacillin tazobactam for now pending operative cultures results. I am not absolutely certain that she also has pneumonia but this antibiotic therapy will also serve the dual purpose of treating pneumonia if it is present.   HPI: Amy Villa is a 47 y.o. female who underwent left carpometacarpal fusion of her left thumb with bone graft and wire fixation on December 5. 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 Five days ago she began to notice increased pain in her left hand and it had some bloody drainage on her gauze dressing. That evening she began to have high fevers up to 103.7 associated with chills. She continued to do poorly over the next few days and states that she felt so bad she could not keep scheduled visits with Dr. Amanda Pea. She was seen yesterday and admitted for incision and drainage and hardware removal. Dr. Amanda Pea noted evidence of bone infection. Operative Gram stain showed gram-positive cocci in pairs and cultures are pending.  Admission chest x-ray noted a faint left perihilar infiltrate that was not seen on her preoperative chest x-ray earlier this month. She states that she has not noticed any change in her mild, chronic smokers cough but has felt a little more short of breath today.   Review of Systems: Constitutional: positive for chills, fevers and malaise, negative for anorexia, night sweats and weight loss Eyes: negative Ears, nose, mouth, throat, and face: negative Respiratory: positive for No change in chronic cough mild shortness of breath today, negative for hemoptysis, pleurisy/chest pain, sputum and wheezing Cardiovascular: negative Gastrointestinal: negative Genitourinary:negative Musculoskeletal:positive for left hand pain, negative for arthralgias and myalgias  Past Medical History  Diagnosis Date  . Arthritis     neck  . Carpal tunnel syndrome on both sides 02/2012  .  Wears partial dentures     lower  . Dental crowns present     x 2 upper front  . History of MRSA infection   . Fluid retention   . Hypertension   . Contact lens/glasses fitting     wears contacts or glasses    History  Substance Use Topics  . Smoking status: Former Smoker -- 1.00 packs/day for 10 years    Types: Cigarettes    Quit date: 06/20/2012  . Smokeless tobacco: Never Used     Comment: quit smoking 1-2 yrs. ago  . Alcohol Use: No      Comment: stopped alcohol-2014-6/14    Family History  Problem Relation Age of Onset  . Diabetes Mother   . Lung cancer    . Colon cancer      neg. family history  . Colon polyps Sister    No Known Allergies  OBJECTIVE: Blood pressure 117/73, pulse 92, temperature 98.3 F (36.8 C), temperature source Oral, resp. rate 16, height 5\' 3"  (1.6 m), weight 52.617 kg (116 lb), SpO2 92.00%.  General: She is alert and in no distress watching television with her husband Skin: No rash Lungs: Rare, faint wheezes without crackles. She does cough with each deep inspiration Cor: Regular S1-S2 no murmurs Abdomen: Soft and nontender Joints and extremities: Large bulky gauze dressing on her left hand  Lab Results Lab Results  Component Value Date   WBC 2.7* 07/11/2013   HGB 11.2* 07/11/2013   HCT 33.7* 07/11/2013   MCV 92.8 07/11/2013   PLT 166 07/11/2013    Lab Results  Component Value Date   CREATININE 0.49* 07/11/2013   BUN 5* 07/11/2013   NA 138 07/11/2013   K 3.5 07/11/2013   CL 99 07/11/2013   CO2 29 07/11/2013    Lab Results  Component Value Date   ALT 14 07/11/2013   AST 25 07/11/2013   ALKPHOS 97 07/11/2013   BILITOT 0.2* 07/11/2013    Lab Results  Component Value Date   CRP 15.7* 07/10/2013   Lab Results  Component Value Date   ESRSEDRATE 55* 07/10/2013   Microbiology: Recent Results (from the past 240 hour(s))  ANAEROBIC CULTURE     Status: None   Collection Time    07/10/13  9:36 PM      Result Value Range Status   Specimen Description HAND LEFT WOUND 1   Final   Special Requests PATIENT ON FOLLOWING ZOSYN   Final   Gram Stain     Final   Value: ABUNDANT WBC PRESENT, PREDOMINANTLY PMN     FEW GRAM POSITIVE COCCI IN PAIRS     Performed by [facility] Gram Stain Report Called to,Read Back By and Verified With: Gram Stain Report Called to,Read Back By and Verified With:  KFINCH/PACU RN AT 2335 ON 07/10/13 BY DLONG     Performed at [facility]   Culture     Final   Value: NO ANAEROBES ISOLATED; CULTURE IN PROGRESS FOR 5 DAYS     Performed at [facility]   Report Status PENDING   Incomplete  WOUND CULTURE     Status: None   Collection Time    07/10/13  9:36 PM      Result Value Range Status   Specimen Description HAND LEFT WOUND 1   Final   Special Requests PATIENT ON FOLLOWING ZOSYN   Final   Gram Stain     Final   Value:  ABUNDANT WBC PRESENT, PREDOMINANTLY PMN     NO ORGANISMS SEEN     FEW GRAM POSITIVE COCCI IN PAIRS     Performed by [facility] Gram Stain Report Called to,Read Back By and Verified With: Gram Stain Report Called to,Read Back By and Verified With:  KFINCH/PACU RN AT 2335 ON 07/10/13 BY DLONG     Performed at [facility]   Culture PENDING   Incomplete   Report Status PENDING   Incomplete  GRAM STAIN     Status: None   Collection Time    07/10/13  9:36 PM      Result Value Range Status   Specimen Description HAND LEFT WOUND 1   Final   Special Requests PATIENT ON FOLLOWING ZOSYN   Final   Gram Stain     Final   Value: ABUNDANT WBC PRESENT, PREDOMINANTLY PMN     FEW GRAM POSITIVE COCCI     Gram Stain Report Called to,Read Back By and Verified With: KFINCH/PACU RN AT 2335 ON [id number] BY DLONG   Report Status 07/10/2013 FINAL   Final  ANAEROBIC CULTURE     Status: None   Collection Time    07/10/13  9:36 PM      Result Value Range Status   Specimen Description HAND LEFT WOUND 2   Final   Special Requests PATIENT ON FOLLOWING ZOSYN   Final   Gram Stain     Final   Value: ABUNDANT WBC PRESENT, PREDOMINANTLY PMN     RARE GRAM POSITIVE COCCI IN PAIRS     Performed by [facility] Gram Stain Report Called to,Read Back By and Verified With: Gram Stain Report Called to,Read Back By and Verified With:  [facility] RN AT 2335 ON 07/12/13 BY DLONG     Performed at [facility]   Culture     Final   Value: NO ANAEROBES ISOLATED; CULTURE IN PROGRESS FOR 5 DAYS      Performed at [facility]   Report Status PENDING   Incomplete  WOUND CULTURE     Status: None   Collection Time    07/10/13  9:36 PM      Result Value Range Status   Specimen Description HAND LEFT WOUND 2   Final   Special Requests PATIENT ON FOLLOWING ZOSYN   Final   Gram Stain     Final   Value: ABUNDANT WBC PRESENT, PREDOMINANTLY PMN     NO SQUAMOUS EPITHELIAL CELLS SEEN     RARE GRAM POSITIVE COCCI IN PAIRS     Performed by [facility] Gram Stain Report Called to,Read Back By and Verified With: Gram Stain Report Called to,Read Back By and Verified With:  [facility] RN AT 2335 ON 07/10/13 BY DLONG     Performed at [facility]   Culture PENDING   Incomplete   Report Status PENDING   Incomplete  GRAM STAIN     Status: None   Collection Time    07/10/13  9:36 PM      Result Value Range Status   Specimen Description HAND LEFT WOUND 2   Final   Special Requests PATIENT ON FOLLOWING ZOSYN   Final   Gram Stain     Final   Value: ABUNDANT WBC PRESENT, PREDOMINANTLY PMN     RARE GRAM POSITIVE COCCI     Gram Stain Report Called to,Read Back By and Verified With: [facility] RN AT 2335 ON [id number] BY  DLONG   Report Status 07/10/2013 FINAL   Final  FUNGUS CULTURE W SMEAR     Status: None   Collection Time    07/10/13  9:36 PM      Result Value Range Status   Specimen Description HAND LEFT WOUND 2   Final   Special Requests PATIENT ON FOLLOWING ZOSYN   Final   Fungal Smear     Final   Value: NO YEAST OR FUNGAL ELEMENTS SEEN     Performed at [facility]   Culture     Final   Value: CULTURE IN PROGRESS FOR FOUR WEEKS     Performed at [facility]   Report Status PENDING   Incomplete    Cliffton Asters, MD [facility] [phone] pager   [phone] cell 07/11/2013, 4:50 PM

## 2013-07-11 NOTE — Op Note (Signed)
NAMEROZANN, HOLTS NO.:  [id number]  MEDICAL RECORD NO.:  [id number]  LOCATION:  1602                         FACILITY:  [facility]  PHYSICIAN:  Dionne Ano. Jennier Schissler, M.D.DATE OF BIRTH:  October 26, 1965  DATE OF PROCEDURE: DATE OF DISCHARGE:                              OPERATIVE REPORT   PREOPERATIVE DIAGNOSES: 1. Status post CMC fusion left base of thumb joint with infectious     sequelae 13 days postop. 2. Pneumonia.  POSTOPERATIVE DIAGNOSES: 1. Status post CMC fusion left base of thumb joint with infectious     sequelae 13 days postop. 2. Pneumonia.  PROCEDURE:  I and D and removal of deep hardware, left thumb CMC joint. This was a radical tenosynovectomy, hardware removal, and a joint debridement including synovectomy of the joint extensive in nature.  SURGEON:  Dionne Ano. Amanda Pea, M.D.  ASSISTANT:  None.  COMPLICATION:  None.  ANESTHESIA:  General, preoperative block.  TOURNIQUET TIME:  Less than an hour.  INDICATIONS:  This is a pleasant 47 year old female, whose history involves multiple medical issues.  She of course had CMC arthrodesis performed 13 days ago.  Five days ago, she developed high fevers, pain, and coughing.  Subsequent to this, she noticed some discharge from her bandage she states and ultimately took her bandage off and manipulated it.  Unfortunately, she has been working very hard after her surgery despite her precautions.  I have counseled in regards to the findings. At the present time, she presented to my office acutely infected with drainage from pin sites with some early pin loosening.  I was very concerned about this.  Given these issues, I recommended immediate I and D.  She was taken to the procedure suite.  She was counseled.  The patient underwent a block by Dr. Ramond Dial in the preop holding area. Following this, she was taken to the procedure suite, underwent a general LMA anesthetic.  Preoperatively, I obtained labs as  well as chest x-ray.  Chest x-ray revealed a pneumonia.  Her potassium was low, but not felt to be critically low enough to forgo surgery and of course this was an emergent issue with the infection.  I did call hospitalist at [facility], who will be seeing her postoperatively with myself. Once in the operative arena, she was prepped and draped in usual sterile fashion.  Supervised by myself, Betadine scrub and paint.  Following this, the arm was elevated, tourniquet insufflated, and she underwent incision about the prior incisions.  This was modified zigzag incision that I created.  She immediately had purulent material identified.  The pins were notable for 1 of the 2 being loose, which I pulled.  The other 1 was loose enough that I felt that fixation will be compromised and I did not want any foreign material in the wound.  I certainly on both hands established a debridement of skin, subcutaneous tissue about the pin track.  Following this, I opened the joint.  The patient was grossly infected.  I performed the synovectomy of the joint removal prior bone graft.  Following this, I performed a tenolysis and tenosynovectomy of the EPD and APL tendons, as well as the extensor pollicis  longus tendon. This was a radical tenosynovectomy, synovectomy, and debridement. Radial artery was preserved.  I identified the superficial radial nerve branch and preserved it.  Following this, the patient had 9 L of fluid placed through the wound.  This 9 L of fluid was placed without difficulty.  Following placement of 9 L of fluid, I felt that consideration for the Stimulan Rapid dissolving Biocomposite would be an option.  I did go ahead and mixed these with vanc and gentamicin.  Following allowing this to hard, I placed a few in the wound.  These were difficult to hold good stability and thus I forgo continuing on with any further Biocomposite Rapid Cure placement.  Although I did mix the mixture, I  did not implant it permanently.  This is a biodegradable product, but I felt that the calcium composite was too fragile for this and I did not want to take the chance on this crumbling to be in a harbinger of bacteria.  Thus, I performed additional irrigation beyond the 9 L, prepped to 10 L and then following this, simply placed a TLS drain, reduced the CMC joint, and closed it loosely.  The patient tolerated this well. Tourniquet time was brief.  The distal radius bone graft size was palpated.  The skin was well sealed and looked perfect and prestain without erythema, thus I did not open this as I did not want to cross to contaminate the area.  She was given vancomycin.  She will also be given Zosyn.  She will be monitored closely.  I am going to plan to consider ID consult for antibiotic purposes.  I would recommend a PICC line if possible pending cultures.  The patient has a history of staph.  She was given preoperative vancomycin at the time of her procedure.  In addition to this, we have always performed meticulous wound care.  Nevertheless this was a horrible infection.  We are going to need to be very aggressive in her management.  I have discussed with the patient these issues, do's and don'ts.  She will be admitted for IV antibiotics, general postop observation, and other measures and should any problems arise I will be immediately available of course.  I do think this will impact on her overall recovery significant in terms of a CMC fusion attempts.  Of course, she may ought to effuse, but I would likely expect additional surgical intervention if necessary.  These notes have been gone over at length and all questions addressed.     Dionne Ano. Amanda Pea, M.D.     [facility]  D:  07/10/2013  T:  07/11/2013  Job:  [id number]

## 2013-07-12 DIAGNOSIS — F17200 Nicotine dependence, unspecified, uncomplicated: Secondary | ICD-10-CM

## 2013-07-12 DIAGNOSIS — J189 Pneumonia, unspecified organism: Secondary | ICD-10-CM

## 2013-07-12 DIAGNOSIS — M869 Osteomyelitis, unspecified: Secondary | ICD-10-CM

## 2013-07-12 DIAGNOSIS — A4901 Methicillin susceptible Staphylococcus aureus infection, unspecified site: Secondary | ICD-10-CM

## 2013-07-12 LAB — CBC WITH DIFFERENTIAL/PLATELET
Basophils Absolute: 0 10*3/uL (ref 0.0–0.1)
Basophils Relative: 0 % (ref 0–1)
Eosinophils Absolute: 0 10*3/uL (ref 0.0–0.7)
HCT: 32 % — ABNORMAL LOW (ref 36.0–46.0)
Hemoglobin: 10.6 g/dL — ABNORMAL LOW (ref 12.0–15.0)
Lymphocytes Relative: 39 % (ref 12–46)
MCH: 31.6 pg (ref 26.0–34.0)
MCHC: 33.1 g/dL (ref 30.0–36.0)
Monocytes Relative: 10 % (ref 3–12)
Neutrophils Relative %: 51 % (ref 43–77)
RDW: 13.4 % (ref 11.5–15.5)
WBC: 5.5 10*3/uL (ref 4.0–10.5)

## 2013-07-12 LAB — BASIC METABOLIC PANEL
BUN: 7 mg/dL (ref 6–23)
CO2: 30 mEq/L (ref 19–32)
Calcium: 8.8 mg/dL (ref 8.4–10.5)
Chloride: 100 mEq/L (ref 96–112)
GFR calc Af Amer: >90 mL/min (ref >90)
GFR calc non Af Amer: >90 mL/min (ref >90)
Potassium: 4 mEq/L (ref 3.5–5.1)

## 2013-07-12 MED ORDER — SODIUM CHLORIDE 0.9 % IJ SOLN
10.0000 mL | INTRAMUSCULAR | Status: DC | PRN
  Administered 2013-07-12: 10 mL

## 2013-07-12 MED ORDER — ALPRAZOLAM 0.5 MG PO TABS: 0.5000 mg | ORAL_TABLET | Freq: Every evening | ORAL | Status: DC | PRN

## 2013-07-12 MED ORDER — OXYCODONE HCL 5 MG PO TABS: 5.0000 mg | ORAL_TABLET | ORAL | Status: DC | PRN

## 2013-07-12 MED ORDER — HEPARIN SOD (PORK) LOCK FLUSH 100 UNIT/ML IV SOLN
250.0000 [IU] | INTRAVENOUS | Status: DC | PRN
  Administered 2013-07-12: 250 [IU]
  Filled 2013-07-12: qty 3

## 2013-07-12 MED ORDER — METHOCARBAMOL 500 MG PO TABS: 500.0000 mg | ORAL_TABLET | Freq: Four times a day (QID) | ORAL | Status: DC | PRN

## 2013-07-12 MED ORDER — HEPARIN SOD (PORK) LOCK FLUSH 100 UNIT/ML IV SOLN
250.0000 [IU] | Freq: Every day | INTRAVENOUS | Status: DC
  Filled 2013-07-12: qty 3

## 2013-07-12 NOTE — Progress Notes (Signed)
ANTIBIOTIC CONSULT NOTE - FOLLOW UP  Pharmacy Consult for vancomycin Indication: Staph aureus wound infection and osteomyelitis L thumb  No Known Allergies  Patient Measurements: Height: 5\' 3"  (160 cm) Weight: 116 lb (52.617 kg) IBW/kg (Calculated) : 52.4   Vital Signs: Temp: 98.6 F (37 C) (12/24 0631) Temp src: Oral (12/24 0631) BP: 104/68 mmHg (12/24 1006) Pulse Rate: 73 (12/24 0631) Intake/Output from previous day: 12/23 0701 - 12/24 0700 In: 1520 [P.O.:720; I.V.:600; IV Piggyback:200] Out: 600 [Urine:600] Intake/Output from this shift:    Labs:  Recent Labs  07/10/13 1555 07/11/13 0434 07/12/13 0510  WBC 7.4 2.7* 5.5  HGB 12.6 11.2* 10.6*  PLT 189 166 192  CREATININE 0.47* 0.49* 0.58   Estimated Creatinine Clearance: 71.9 ml/min (by C-G formula based on Cr of 0.58).    Microbiology: Recent Results (from the past 720 hour(s))  ANAEROBIC CULTURE     Status: None   Collection Time    07/10/13  9:36 PM      Result Value Range Status   Specimen Description HAND LEFT WOUND 1   Final   Special Requests PATIENT ON FOLLOWING ZOSYN   Final   Gram Stain     Final   Value: ABUNDANT WBC PRESENT, PREDOMINANTLY PMN     FEW GRAM POSITIVE COCCI IN PAIRS     Performed by [facility] Gram Stain Report Called to,Read Back By and Verified With: Gram Stain Report Called to,Read Back By and Verified With:  KFINCH/PACU RN AT 2335 ON 07/10/13 BY DLONG     Performed at [facility]   Culture     Final   Value: NO ANAEROBES ISOLATED; CULTURE IN PROGRESS FOR 5 DAYS     Performed at [facility]   Report Status PENDING   Incomplete  WOUND CULTURE     Status: None   Collection Time    07/10/13  9:36 PM      Result Value Range Status   Specimen Description HAND LEFT WOUND 1   Final   Special Requests PATIENT ON FOLLOWING ZOSYN   Final   Gram Stain     Final   Value: ABUNDANT WBC PRESENT, PREDOMINANTLY PMN     NO ORGANISMS SEEN     FEW GRAM POSITIVE  COCCI IN PAIRS     Performed by [facility] Gram Stain Report Called to,Read Back By and Verified With: Gram Stain Report Called to,Read Back By and Verified With:  KFINCH/PACU RN AT 2335 ON 07/10/13 BY DLONG     Performed at [facility]   Culture     Final   Value: FEW STAPHYLOCOCCUS AUREUS     Note: RIFAMPIN AND GENTAMICIN SHOULD NOT BE USED AS SINGLE DRUGS FOR TREATMENT OF STAPH INFECTIONS.     Performed at [facility]   Report Status PENDING   Incomplete  GRAM STAIN     Status: None   Collection Time    07/10/13  9:36 PM      Result Value Range Status   Specimen Description HAND LEFT WOUND 1   Final   Special Requests PATIENT ON FOLLOWING ZOSYN   Final   Gram Stain     Final   Value: ABUNDANT WBC PRESENT, PREDOMINANTLY PMN     FEW GRAM POSITIVE COCCI     Gram Stain Report Called to,Read Back By and Verified With: KFINCH/PACU RN AT 2335 ON [id number] BY DLONG   Report Status 07/10/2013 FINAL  Final  ANAEROBIC CULTURE     Status: None   Collection Time    07/10/13  9:36 PM      Result Value Range Status   Specimen Description HAND LEFT WOUND 2   Final   Special Requests PATIENT ON FOLLOWING ZOSYN   Final   Gram Stain     Final   Value: ABUNDANT WBC PRESENT, PREDOMINANTLY PMN     RARE GRAM POSITIVE COCCI IN PAIRS     Performed by [facility] Gram Stain Report Called to,Read Back By and Verified With: Gram Stain Report Called to,Read Back By and Verified With:  [facility] RN AT 2335 ON 07/12/13 BY DLONG     Performed at [facility]   Culture     Final   Value: NO ANAEROBES ISOLATED; CULTURE IN PROGRESS FOR 5 DAYS     Performed at [facility]   Report Status PENDING   Incomplete  WOUND CULTURE     Status: None   Collection Time    07/10/13  9:36 PM      Result Value Range Status   Specimen Description HAND LEFT WOUND 2   Final   Special Requests PATIENT ON FOLLOWING ZOSYN   Final   Gram Stain     Final   Value: ABUNDANT WBC  PRESENT, PREDOMINANTLY PMN     NO SQUAMOUS EPITHELIAL CELLS SEEN     RARE GRAM POSITIVE COCCI IN PAIRS     Performed by [facility] Gram Stain Report Called to,Read Back By and Verified With: Gram Stain Report Called to,Read Back By and Verified With:  [facility] RN AT 2335 ON 07/10/13 BY DLONG     Performed at [facility]   Culture     Final   Value: NO GROWTH 1 DAY     Performed at [facility]   Report Status PENDING   Incomplete  GRAM STAIN     Status: None   Collection Time    07/10/13  9:36 PM      Result Value Range Status   Specimen Description HAND LEFT WOUND 2   Final   Special Requests PATIENT ON FOLLOWING ZOSYN   Final   Gram Stain     Final   Value: ABUNDANT WBC PRESENT, PREDOMINANTLY PMN     RARE GRAM POSITIVE COCCI     Gram Stain Report Called to,Read Back By and Verified With: [facility] RN AT 2335 ON [id number] BY DLONG   Report Status 07/10/2013 FINAL   Final  FUNGUS CULTURE W SMEAR     Status: None   Collection Time    07/10/13  9:36 PM      Result Value Range Status   Specimen Description HAND LEFT WOUND 2   Final   Special Requests PATIENT ON FOLLOWING ZOSYN   Final   Fungal Smear     Final   Value: NO YEAST OR FUNGAL ELEMENTS SEEN     Performed at [facility]   Culture     Final   Value: CULTURE IN PROGRESS FOR FOUR WEEKS     Performed at [facility]   Report Status PENDING   Incomplete    Anti-infectives   Start     Dose/Rate Route Frequency Ordered Stop   07/11/13 1000  vancomycin (VANCOCIN) IVPB 750 mg/150 ml premix     750 mg 150 mL/hr over 60 Minutes Intravenous Every 12 hours 07/11/13 0046  07/11/13 0600  piperacillin-tazobactam (ZOSYN) IVPB 3.375 g  Status:  Discontinued     3.375 g 12.5 mL/hr over 240 Minutes Intravenous 3 times per day 07/11/13 0048 07/12/13 1231   07/11/13 0100  piperacillin-tazobactam (ZOSYN) IVPB 3.375 g  Status:  Discontinued     3.375 g 12.5 mL/hr over 240 Minutes Intravenous 3 times  per day 07/11/13 0046 07/11/13 0048   07/10/13 2231  vancomycin (VANCOCIN) powder  Status:  Discontinued       As needed 07/10/13 2233 07/10/13 2304   07/10/13 2145  gentamicin (GARAMYCIN) injection 240 mg     240 mg Other  Once 07/10/13 2136 07/10/13 2230   07/10/13 2109  piperacillin-tazobactam (ZOSYN) IVPB 3.375 g  Status:  Discontinued     3.375 g 100 mL/hr over 30 Minutes Intravenous 30 min pre-op 07/10/13 2110 07/11/13 0014      Assessment: 47 y/o F on D#2 vancomycin 750 mg IV q12h for Staph aureus wound infection with early osteomyelitis of L thumb.  Possibility of discharge today on IV antibiotic noted.  The susceptibilities on the Staph aureus are still pending and Dr. Orvan Falconer indicates he will follow-up on these.  Goal of Therapy:  Eradication of infection Vancomycin trough 15-20  Recommend:  1. Continue vancomycin 750 mg IV q12h 2. If vancomycin is continued after final culture results are reviewed, recommend checking vancomycin trough and serum creatinine on Friday 12/26, then serum creatinine at least twice a week and vancomycin trough at least once a week.  Elie Goody, PharmD, BCPS Pager: [phone] 07/12/2013  12:46 PM

## 2013-07-12 NOTE — Progress Notes (Signed)
[facility] Consult FU  ANNAKA CLEAVER ZOX:[id number] DOB: 1966-03-25 DOA: 07/10/2013  PCP: Herb Grays, MD  Brief HPI: 47 yo WF PMHx HTN,Hx MRSA infection, S/P multiple lumbar back surgeries, S/P carpal tunnel release surgery. Patient presented to Dr. Dominica Severin (orthopedic surgery) c/o Infection left hand and wrist/CMC joint S/P post CMC fusion 13 days prior to admission. History of fevers in the last week with x-ray evidence of pneumonia. Per Dr.Gramig patient noticed a fever Thursday (5 days ago). She took her brace off and notice some redness and a slight bloody discharge about her wound region over the weekend. She was hospitalized for debridement and IV antibiotics.  Past medical history:  Past Medical History  Diagnosis Date  . Arthritis     neck  . Carpal tunnel syndrome on both sides 02/2012  . Wears partial dentures     lower  . Dental crowns present     x 2 upper front  . History of MRSA infection   . Fluid retention   . Hypertension   . Contact lens/glasses fitting     wears contacts or glasses    Primary Service: Dr. Amanda Pea with ortho  Procedures: I and D and removal of deep hardware, left thumb CMC joint. This was a radical tenosynovectomy, hardware removal, and a joint debridement including synovectomy of the joint extensive in nature. On 12/22  Antibiotics: Vanc and Zosyn  Subjective: Patient complains of pain in left hand. -cough and dyspnea improving, was wheezing this am  Objective: Vital Signs  Filed Vitals:   07/11/13 2025 07/12/13 0631 07/12/13 0913 07/12/13 1006  BP: 105/69 110/76  104/68  Pulse: 82 73    Temp: 98.9 F (37.2 C) 98.6 F (37 C)    TempSrc: Oral Oral    Resp: 16 14    Height:      Weight:      SpO2: 96% 93% 94%     Intake/Output Summary (Last 24 hours) at 07/12/13 1017 Last data filed at 07/11/13 2257  Gross per 24 hour  Intake   1280 ml  Output    600 ml  Net    680 ml   Filed Weights   07/10/13 1503   Weight: 52.617 kg (116 lb)    General appearance: alert, cooperative, appears stated age and no distress Resp: decreased air entry bilaterally with crackles at bases, no  Cardio: regular rate and rhythm, S1, S2 normal, no murmur, click, rub or gallop GI: soft, non-tender; bowel sounds normal; no masses,  no organomegaly Extremities: LUE in dressing, no cyanosis or edema Neurologic: Alert and oriented X 3, no focal deficits  Lab Results:  Basic Metabolic Panel:  Recent Labs Lab 07/10/13 1555 07/11/13 0434 07/12/13 0510  NA 134* 138 139  K 2.8* 3.5 4.0  CL 91* 99 100  CO2 30 29 30   GLUCOSE 90 224* 129*  BUN 5* 5* 7  CREATININE 0.47* 0.49* 0.58  CALCIUM 9.6 9.0 8.8  MG  --  2.0  --    Liver Function Tests:  Recent Labs Lab 07/11/13 0434  AST 25  ALT 14  ALKPHOS 97  BILITOT 0.2*  PROT 6.7  ALBUMIN 3.1*   CBC:  Recent Labs Lab 07/10/13 1555 07/11/13 0434 07/12/13 0510  WBC 7.4 2.7* 5.5  NEUTROABS  --  1.9 2.8  HGB 12.6 11.2* 10.6*  HCT 36.4 33.7* 32.0*  MCV 91.5 92.8 95.5  PLT 189 166 192     Recent Results (from  the past 240 hour(s))  ANAEROBIC CULTURE     Status: None   Collection Time    07/10/13  9:36 PM      Result Value Range Status   Specimen Description HAND LEFT WOUND 1   Final   Special Requests PATIENT ON FOLLOWING ZOSYN   Final   Gram Stain     Final   Value: ABUNDANT WBC PRESENT, PREDOMINANTLY PMN     FEW GRAM POSITIVE COCCI IN PAIRS     Performed by [facility] Gram Stain Report Called to,Read Back By and Verified With: Gram Stain Report Called to,Read Back By and Verified With:  KFINCH/PACU RN AT 2335 ON 07/10/13 BY DLONG     Performed at [facility]   Culture     Final   Value: NO ANAEROBES ISOLATED; CULTURE IN PROGRESS FOR 5 DAYS     Performed at [facility]   Report Status PENDING   Incomplete  WOUND CULTURE     Status: None   Collection Time    07/10/13  9:36 PM      Result Value Range Status    Specimen Description HAND LEFT WOUND 1   Final   Special Requests PATIENT ON FOLLOWING ZOSYN   Final   Gram Stain     Final   Value: ABUNDANT WBC PRESENT, PREDOMINANTLY PMN     NO ORGANISMS SEEN     FEW GRAM POSITIVE COCCI IN PAIRS     Performed by [facility] Gram Stain Report Called to,Read Back By and Verified With: Gram Stain Report Called to,Read Back By and Verified With:  KFINCH/PACU RN AT 2335 ON 07/10/13 BY DLONG     Performed at [facility]   Culture PENDING   Incomplete   Report Status PENDING   Incomplete  GRAM STAIN     Status: None   Collection Time    07/10/13  9:36 PM      Result Value Range Status   Specimen Description HAND LEFT WOUND 1   Final   Special Requests PATIENT ON FOLLOWING ZOSYN   Final   Gram Stain     Final   Value: ABUNDANT WBC PRESENT, PREDOMINANTLY PMN     FEW GRAM POSITIVE COCCI     Gram Stain Report Called to,Read Back By and Verified With: KFINCH/PACU RN AT 2335 ON [id number] BY DLONG   Report Status 07/10/2013 FINAL   Final  ANAEROBIC CULTURE     Status: None   Collection Time    07/10/13  9:36 PM      Result Value Range Status   Specimen Description HAND LEFT WOUND 2   Final   Special Requests PATIENT ON FOLLOWING ZOSYN   Final   Gram Stain     Final   Value: ABUNDANT WBC PRESENT, PREDOMINANTLY PMN     RARE GRAM POSITIVE COCCI IN PAIRS     Performed by [facility] Gram Stain Report Called to,Read Back By and Verified With: Gram Stain Report Called to,Read Back By and Verified With:  [facility] RN AT 2335 ON 07/12/13 BY DLONG     Performed at [facility]   Culture     Final   Value: NO ANAEROBES ISOLATED; CULTURE IN PROGRESS FOR 5 DAYS     Performed at [facility]   Report Status PENDING   Incomplete  WOUND CULTURE     Status: None   Collection Time    07/10/13  9:36 PM      Result Value Range Status   Specimen Description HAND LEFT WOUND 2   Final   Special Requests PATIENT ON FOLLOWING ZOSYN   Final    Gram Stain     Final   Value: ABUNDANT WBC PRESENT, PREDOMINANTLY PMN     NO SQUAMOUS EPITHELIAL CELLS SEEN     RARE GRAM POSITIVE COCCI IN PAIRS     Performed by [facility] Gram Stain Report Called to,Read Back By and Verified With: Gram Stain Report Called to,Read Back By and Verified With:  [facility] RN AT 2335 ON 07/10/13 BY DLONG     Performed at [facility]   Culture PENDING   Incomplete   Report Status PENDING   Incomplete  GRAM STAIN     Status: None   Collection Time    07/10/13  9:36 PM      Result Value Range Status   Specimen Description HAND LEFT WOUND 2   Final   Special Requests PATIENT ON FOLLOWING ZOSYN   Final   Gram Stain     Final   Value: ABUNDANT WBC PRESENT, PREDOMINANTLY PMN     RARE GRAM POSITIVE COCCI     Gram Stain Report Called to,Read Back By and Verified With: [facility] RN AT 2335 ON [id number] BY DLONG   Report Status 07/10/2013 FINAL   Final  FUNGUS CULTURE W SMEAR     Status: None   Collection Time    07/10/13  9:36 PM      Result Value Range Status   Specimen Description HAND LEFT WOUND 2   Final   Special Requests PATIENT ON FOLLOWING ZOSYN   Final   Fungal Smear     Final   Value: NO YEAST OR FUNGAL ELEMENTS SEEN     Performed at [facility]   Culture     Final   Value: CULTURE IN PROGRESS FOR FOUR WEEKS     Performed at [facility]   Report Status PENDING   Incomplete      Studies/Results: Dg Chest 2 View  07/10/2013   CLINICAL DATA:  Hypertension.  EXAM: CHEST  2 VIEW  COMPARISON:  06/24/2012.  FINDINGS: Left perihilar infiltrate noted consistent with pneumonia. This is a new finding. Heart size and pulmonary vascularity normal. No pleural effusion or pneumothorax. No acute bony abnormality.  IMPRESSION: Left perihilar infiltrate consistent with pneumonia.  These results will be called to the ordering clinician or representative by the Radiologist Assistant, and communication documented in the PACS Dashboard.    Electronically Signed   By: Maisie Fus  Register   On: 07/10/2013 16:10    Medications:  Scheduled: . ipratropium  0.5 mg Nebulization BID   And  . albuterol  2.5 mg Nebulization BID  . cloNIDine  0.1 mg Oral TID  . dextromethorphan-guaiFENesin  1 tablet Oral BID  . docusate sodium  100 mg Oral BID  . gabapentin  600 mg Oral 5 X Daily  . nicotine  21 mg Transdermal Daily  . piperacillin-tazobactam (ZOSYN)  IV  3.375 g Intravenous Q8H  . vancomycin  750 mg Intravenous Q12H  . vitamin C  1,000 mg Oral Daily   Continuous: . dextrose 5 % and 0.9 % NaCl with KCl 40 mEq/L 75 mL/hr at 07/11/13 4696   EXB:[id number], ALPRAZolam, famotidine, hydrOXYzine, ipratropium, methocarbamol (ROBAXIN) IV, methocarbamol, morphine injection, ondansetron (ZOFRAN) IV, ondansetron, oxyCODONE, promethazine, sodium chloride  Assessment/Plan:  Pneumonia most likely HCAP  She was in [facility] recently for procedure per Dr. Carlos Levering note.  -Continue vancomycin and Zosyn, Day 2 of abx -Continue DuoNeb's, Mucinex DM.  -weaned off O2  Alcoholism  Placed on CIWA withdrawal assessment every 4 hours, if patient exhibits signs or symptoms of alcohol withdrawal institute CIWA protocol   Nicotine dependent  Continue nicotine patch   Cellulitis/abscess left hand  Patient on antibiotics which would serve dual purpose of controlling pneumonia and Lt hand cellulitis/abscess.  -ID following -Wound cultures pending, gram stain with GPC in pairs.  History of HTN Bp soft, hold HCTZ  Hyponatremia  Improved with hydration and holding HCTZ.   Hypokalemia  Improved.   Depressive disorder  Stable. Continue home medications.    LOS: 2 days   [facility]  [facility] Pager [phone]  07/12/2013, 10:17 AM  If 8PM-8AM, please contact night-coverage at [email or web address], password [facility]

## 2013-07-12 NOTE — Progress Notes (Signed)
Patient ID: Amy Villa, female   DOB: 12-26-1965, 47 y.o.   MRN: [id number]         [facility]    Date of Admission:  07/10/2013           Day 3 vancomycin        Day 3 piperacillin tazobactam Principal Problem:   Bone infection of left hand Active Problems:   CMC arthritis, thumb, degenerative   Leukopenia   ALCOHOLISM   Acute alcoholic hepatitis   PANCREATITIS   Depressive disorder, not elsewhere classified   Hyponatremia   Hypokalemia   Nicotine dependence   HCAP (healthcare-associated pneumonia)   DJD (degenerative joint disease)   DDD (degenerative disc disease)   Anemia   Narcotic abuse   . ipratropium  0.5 mg Nebulization BID   And  . albuterol  2.5 mg Nebulization BID  . cloNIDine  0.1 mg Oral TID  . dextromethorphan-guaiFENesin  1 tablet Oral BID  . docusate sodium  100 mg Oral BID  . gabapentin  600 mg Oral 5 X Daily  . nicotine  21 mg Transdermal Daily  . piperacillin-tazobactam (ZOSYN)  IV  3.375 g Intravenous Q8H  . vancomycin  750 mg Intravenous Q12H  . vitamin C  1,000 mg Oral Daily    Subjective: She is feeling better. She still has some pain in her left hand. Her chronic smokers cough is at baseline. Review of Systems: Pertinent items are noted in HPI.  Past Medical History  Diagnosis Date  . Arthritis     neck  . Carpal tunnel syndrome on both sides 02/2012  . Wears partial dentures     lower  . Dental crowns present     x 2 upper front  . History of MRSA infection   . Fluid retention   . Hypertension   . Contact lens/glasses fitting     wears contacts or glasses    History  Substance Use Topics  . Smoking status: Former Smoker -- 1.00 packs/day for 10 years    Types: Cigarettes    Quit date: 06/20/2012  . Smokeless tobacco: Never Used     Comment: quit smoking 1-2 yrs. ago  . Alcohol Use: No     Comment: stopped alcohol-2014-6/14    Family History  Problem Relation Age of Onset  . Diabetes  Mother   . Lung cancer    . Colon cancer      neg. family history  . Colon polyps Sister     No Known Allergies  Objective: Temp:  [98.3 F (36.8 C)-98.9 F (37.2 C)] 98.6 F (37 C) (12/24 0631) Pulse Rate:  [73-92] 73 (12/24 0631) Resp:  [14-16] 14 (12/24 0631) BP: (104-117)/(68-76) 104/68 mmHg (12/24 1006) SpO2:  [92 %-96 %] 94 % (12/24 0913)  General: She is comfortable and in no distress Skin: New right arm PICC Lungs: Clear Cor: Regular S1 and S2 with no murmurs Bulky dressing on left hand  Lab Results Lab Results  Component Value Date   WBC 5.5 07/12/2013   HGB 10.6* 07/12/2013   HCT 32.0* 07/12/2013   MCV 95.5 07/12/2013   PLT 192 07/12/2013    Lab Results  Component Value Date   CREATININE 0.58 07/12/2013   BUN 7 07/12/2013   NA 139 07/12/2013   K 4.0 07/12/2013   CL 100 07/12/2013   CO2 30 07/12/2013    Lab Results  Component Value Date   ALT 14 07/11/2013  AST 25 07/11/2013   ALKPHOS 97 07/11/2013   BILITOT 0.2* 07/11/2013      Microbiology: Recent Results (from the past 240 hour(s))  ANAEROBIC CULTURE     Status: None   Collection Time    07/10/13  9:36 PM      Result Value Range Status   Specimen Description HAND LEFT WOUND 1   Final   Special Requests PATIENT ON FOLLOWING ZOSYN   Final   Gram Stain     Final   Value: ABUNDANT WBC PRESENT, PREDOMINANTLY PMN     FEW GRAM POSITIVE COCCI IN PAIRS     Performed by [facility] Gram Stain Report Called to,Read Back By and Verified With: Gram Stain Report Called to,Read Back By and Verified With:  KFINCH/PACU RN AT 2335 ON 07/10/13 BY DLONG     Performed at [facility]   Culture     Final   Value: NO ANAEROBES ISOLATED; CULTURE IN PROGRESS FOR 5 DAYS     Performed at [facility]   Report Status PENDING   Incomplete  WOUND CULTURE     Status: None   Collection Time    07/10/13  9:36 PM      Result Value Range Status   Specimen Description HAND LEFT WOUND 1   Final    Special Requests PATIENT ON FOLLOWING ZOSYN   Final   Gram Stain     Final   Value: ABUNDANT WBC PRESENT, PREDOMINANTLY PMN     NO ORGANISMS SEEN     FEW GRAM POSITIVE COCCI IN PAIRS     Performed by [facility] Gram Stain Report Called to,Read Back By and Verified With: Gram Stain Report Called to,Read Back By and Verified With:  KFINCH/PACU RN AT 2335 ON 07/10/13 BY DLONG     Performed at [facility]   Culture     Final   Value: FEW STAPHYLOCOCCUS AUREUS     Note: RIFAMPIN AND GENTAMICIN SHOULD NOT BE USED AS SINGLE DRUGS FOR TREATMENT OF STAPH INFECTIONS.     Performed at [facility]   Report Status PENDING   Incomplete  GRAM STAIN     Status: None   Collection Time    07/10/13  9:36 PM      Result Value Range Status   Specimen Description HAND LEFT WOUND 1   Final   Special Requests PATIENT ON FOLLOWING ZOSYN   Final   Gram Stain     Final   Value: ABUNDANT WBC PRESENT, PREDOMINANTLY PMN     FEW GRAM POSITIVE COCCI     Gram Stain Report Called to,Read Back By and Verified With: KFINCH/PACU RN AT 2335 ON [id number] BY DLONG   Report Status 07/10/2013 FINAL   Final  ANAEROBIC CULTURE     Status: None   Collection Time    07/10/13  9:36 PM      Result Value Range Status   Specimen Description HAND LEFT WOUND 2   Final   Special Requests PATIENT ON FOLLOWING ZOSYN   Final   Gram Stain     Final   Value: ABUNDANT WBC PRESENT, PREDOMINANTLY PMN     RARE GRAM POSITIVE COCCI IN PAIRS     Performed by [facility] Gram Stain Report Called to,Read Back By and Verified With: Gram Stain Report Called to,Read Back By and Verified With:  [facility] RN AT 2335 ON 07/12/13 BY [employer] [facility]     Performed  at [employer]     Final   Value: NO ANAEROBES ISOLATED; CULTURE IN PROGRESS FOR 5 DAYS     Performed at [facility]   Report Status PENDING   Incomplete  WOUND CULTURE     Status: None   Collection Time    07/10/13  9:36 PM      Result  Value Range Status   Specimen Description HAND LEFT WOUND 2   Final   Special Requests PATIENT ON FOLLOWING ZOSYN   Final   Gram Stain     Final   Value: ABUNDANT WBC PRESENT, PREDOMINANTLY PMN     NO SQUAMOUS EPITHELIAL CELLS SEEN     RARE GRAM POSITIVE COCCI IN PAIRS     Performed by [facility] Gram Stain Report Called to,Read Back By and Verified With: Gram Stain Report Called to,Read Back By and Verified With:  [facility] RN AT 2335 ON 07/10/13 BY DLONG     Performed at [facility]   Culture     Final   Value: NO GROWTH 1 DAY     Performed at [facility]   Report Status PENDING   Incomplete  GRAM STAIN     Status: None   Collection Time    07/10/13  9:36 PM      Result Value Range Status   Specimen Description HAND LEFT WOUND 2   Final   Special Requests PATIENT ON FOLLOWING ZOSYN   Final   Gram Stain     Final   Value: ABUNDANT WBC PRESENT, PREDOMINANTLY PMN     RARE GRAM POSITIVE COCCI     Gram Stain Report Called to,Read Back By and Verified With: [facility] RN AT 2335 ON [id number] BY DLONG   Report Status 07/10/2013 FINAL   Final  FUNGUS CULTURE W SMEAR     Status: None   Collection Time    07/10/13  9:36 PM      Result Value Range Status   Specimen Description HAND LEFT WOUND 2   Final   Special Requests PATIENT ON FOLLOWING ZOSYN   Final   Fungal Smear     Final   Value: NO YEAST OR FUNGAL ELEMENTS SEEN     Performed at [facility]   Culture     Final   Value: CULTURE IN PROGRESS FOR FOUR WEEKS     Performed at [facility]   Report Status PENDING   Incomplete    Studies/Results: Dg Chest 2 View  07/10/2013   CLINICAL DATA:  Hypertension.  EXAM: CHEST  2 VIEW  COMPARISON:  06/24/2012.  FINDINGS: Left perihilar infiltrate noted consistent with pneumonia. This is a new finding. Heart size and pulmonary vascularity normal. No pleural effusion or pneumothorax. No acute bony abnormality.  IMPRESSION: Left perihilar infiltrate consistent  with pneumonia.  These results will be called to the ordering clinician or representative by the Radiologist Assistant, and communication documented in the PACS Dashboard.   Electronically Signed   By: Maisie Fus  Register   On: 07/10/2013 16:10    Assessment: She has a staph aureus wound infection with early osteomyelitis of her left thumb. I do not feel she needs continued therapy for possible pneumonia. I will continue vancomycin alone pending final cultures. It is okay with me for her to be discharged at any time. If she goes home today I will followup cultures over the holiday and make any changes in antibiotic therapy  that are needed with [facility].  Plan: 1. Continue vancomycin pending final cultures 2. Discontinue vancomycin 3. I will arrange followup in my clinic on Thursday, January 8  Cliffton Asters, MD [facility] for Infectious Disease [facility] [phone] pager   [phone] cell 07/12/2013, 12:27 PM

## 2013-07-12 NOTE — Progress Notes (Signed)
Peripherally Inserted Central Catheter/Midline Placement  The IV Nurse has discussed with the patient and/or persons authorized to consent for the patient, the purpose of this procedure and the potential benefits and risks involved with this procedure.  The benefits include less needle sticks, lab draws from the catheter and patient may be discharged home with the catheter.  Risks include, but not limited to, infection, bleeding, blood clot (thrombus formation), and puncture of an artery; nerve damage and irregular heat beat.  Alternatives to this procedure were also discussed.  PICC/Midline Placement Documentation        Amy Villa 07/12/2013, 8:39 AM

## 2013-07-12 NOTE — Discharge Summary (Signed)
Physician Discharge Summary  Patient ID: Amy Villa MRN: [id number] DOB/AGE: 1965-07-22 47 y.o.  Admit date: 07/10/2013 Discharge date: 07/12/2013  Admission Diagnoses: #1 Status left thumb status post CMC fusion with noted infectious sequela #2 pneumonia  Discharge Diagnoses: Same Principal Problem:   Bone infection of left hand Active Problems:   ALCOHOLISM   Acute alcoholic hepatitis   PANCREATITIS   Depressive disorder, not elsewhere classified   CMC arthritis, thumb, degenerative   Hyponatremia   Hypokalemia   Nicotine dependence   HCAP (healthcare-associated pneumonia)   DJD (degenerative joint disease)   DDD (degenerative disc disease)   Anemia   Leukopenia   Narcotic abuse   Discharged Condition: fair  Hospital Course: Patient was admitted postop status post I&D left thumb CMC fusion. And the patient underwent irrigation and debridement of an infected CMC joint. She grew out Staphylococcus. She is going to be treated with 6 weeks of IV vancomycin. She will await her final culture sensitivities and Dr. Cliffton Asters of infectious disease will change the antibiotic if necessary.  She was treated with Zosyn and vancomycin for infection. The patient was tailored off of the Zosyn and just placed on vancomycin. She is doing much better. The a patient is alert and oriented. We have asked her to stop smoking. We've asked her not to drink alcohol. Certainly she says a very significant challenges in front of her including manipulation of her dressing her wound according to her husband. Nevertheless she is on the right track. I discussed she and her family all issues. She is seen medicine, infectious disease, and myself.  At time of discharge she is awake alert and oriented and tolerating a regular diet. She has no signs of DVT or complicating features.  Consults: ID  Significant Diagnostic Studies: labs: See  Treatments: surgery: See chart  Discharge Exam: Blood  pressure 104/68, pulse 73, temperature 98.6 F (37 C), temperature source Oral, resp. rate 14, height 5\' 3"  (1.6 m), weight 52.617 kg (116 lb), SpO2 94.00%. General appearance: alert patient is oriented her dressing is clean dry and intact. She has no complications. [name].The patient is alert and oriented in no acute distress the patient complains of pain in the affected upper extremity.  The patient is noted to have a normal HEENT exam.  Lung fields show equal chest expansion and no shortness of breath  abdomen exam is nontender without distention.  Lower extremity examination does not show any fracture dislocation or blood clot symptoms.  Pelvis is stable neck and back are stable and nontender  Disposition: 01-Home or Self Care     Medication List    ASK your doctor about these medications       cloNIDine 0.1 MG tablet  Commonly known as:  CATAPRES  Take 0.1 mg by mouth 3 (three) times daily.     gabapentin 300 MG capsule  Commonly known as:  NEURONTIN  Take 600 mg by mouth 5 (five) times daily.     hydrochlorothiazide 25 MG tablet  Commonly known as:  HYDRODIURIL  Take 25 mg by mouth daily.     hydrOXYzine 50 MG tablet  Commonly known as:  ATARAX/VISTARIL  Take 50 mg by mouth at bedtime as needed for anxiety (sleep).     ibuprofen 200 MG tablet  Commonly known as:  ADVIL,MOTRIN  Take 400 mg by mouth every 6 (six) hours as needed for fever or moderate pain.     loratadine 10 MG tablet  Commonly known  as:  CLARITIN  Take 1 tablet (10 mg total) by mouth daily.     oxyCODONE-acetaminophen 10-325 MG per tablet  Commonly known as:  PERCOCET  Take 1 tablet by mouth every 4 (four) hours as needed for pain.         Signed: Karen Chafe 07/12/2013, 2:28 PM

## 2013-07-13 LAB — WOUND CULTURE

## 2013-07-15 LAB — ANAEROBIC CULTURE

## 2013-07-15 LAB — WOUND CULTURE

## 2013-07-17 LAB — BENZODIAZEPINE, QUANTITATIVE, URINE
Alprazolam metabolite (GC/LC/MS), ur confirm: NEGATIVE ng/mL
Diazepam (GC/LC/MS), ur confirm: NEGATIVE ng/mL
Estazolam (GC/LC/MS), ur confirm: NEGATIVE ng/mL
Flurazepam GC/MS Conf: NEGATIVE ng/mL
Lorazepam (GC/LC/MS), ur confirm: NEGATIVE ng/mL
Midazolam (GC/LC/MS), ur confirm: 1925 ng/mL
Oxazepam GC/MS Conf: NEGATIVE ng/mL
Triazolam metabolite (GC/LC/MS), ur confirm: NEGATIVE ng/mL

## 2013-07-17 LAB — OPIATE, QUANTITATIVE, URINE
Codeine Urine: NEGATIVE ng/mL
Hydromorphone GC/MS Conf: NEGATIVE ng/mL
Morphine, Confirm: 359 ng/mL
Norhydrocodone, Ur: 260 ng/mL
Noroxycodone, Ur: 2471 ng/mL
Oxycodone, ur: 618 ng/mL

## 2013-07-24 ENCOUNTER — Telehealth: Payer: Self-pay | Admitting: *Deleted

## 2013-07-24 DIAGNOSIS — M869 Osteomyelitis, unspecified: Secondary | ICD-10-CM

## 2013-07-24 MED ORDER — CEFAZOLIN SODIUM 1 G IV SOLR: 2.0000 g | Freq: Three times a day (TID) | INTRAVENOUS | Status: DC

## 2013-07-24 NOTE — Telephone Encounter (Signed)
Amy Villa from [facility] called to report a vancomycin side effect.  Per [facility] RN, the patient reported ringing in ears x 3 days. Per Amy Villa, the patient was subtherapeutic on vanc last week, her dose was increased from 750 mg q12 to 1250 mg q12 (07/17/13 trough was 5, pt reported taking as scheduled). Today's am dose held.  Will get new trough this morning, should result this afternoon. Pt has appointment 1/8 with Dr. Megan Salon. Landis Gandy, RN

## 2013-07-24 NOTE — Telephone Encounter (Signed)
Her operative cultures are now finally grew methicillin sensitive staph aureus. I'm not sure that her tinnitus is due to vancomycin but it will be best if we change her from vancomycin to cefazolin anyway.

## 2013-07-24 NOTE — Telephone Encounter (Signed)
Order given to [place] at [place]. He will send the RN over with the medication as well as an allergy kit tomorrow to administer the first dose.  [facility] will collect labs as per protocol.

## 2013-07-24 NOTE — Addendum Note (Signed)
Addended by: Michel Bickers on: 07/24/2013 05:05 PM   Modules accepted: Orders

## 2013-07-27 ENCOUNTER — Ambulatory Visit: Payer: Self-pay | Admitting: Internal Medicine

## 2013-07-27 ENCOUNTER — Telehealth: Payer: Self-pay | Admitting: *Deleted

## 2013-07-27 NOTE — Telephone Encounter (Signed)
Left pt message inquiring if she is continuing to have side effect symptoms which prompted changing IV abx to cephazolin 07/24/13.  Requested she call [facility] if she is continuing to have side effect symptoms.  Reminded pt of rescheduled appt on 08/01/13 w/ Dr. Megan Salon.

## 2013-08-01 ENCOUNTER — Ambulatory Visit: Payer: Self-pay | Admitting: Internal Medicine

## 2013-08-07 ENCOUNTER — Ambulatory Visit (INDEPENDENT_AMBULATORY_CARE_PROVIDER_SITE_OTHER): Payer: BC Managed Care – PPO | Admitting: Internal Medicine

## 2013-08-07 ENCOUNTER — Encounter: Payer: Self-pay | Admitting: Internal Medicine

## 2013-08-07 VITALS — BP 118/80 | HR 82 | Temp 98.3°F | Ht 63.0 in | Wt 121.5 lb

## 2013-08-07 DIAGNOSIS — M869 Osteomyelitis, unspecified: Secondary | ICD-10-CM

## 2013-08-07 DIAGNOSIS — Z23 Encounter for immunization: Secondary | ICD-10-CM

## 2013-08-07 LAB — C-REACTIVE PROTEIN

## 2013-08-07 NOTE — Progress Notes (Addendum)
Patient ID: Amy Villa, female   DOB: 07/01/1966, 48 y.o.   MRN: [id number]         [facility]  Patient Active Problem List   Diagnosis Date Noted  . Bone infection of left hand 07/10/2013    Priority: High  . Leukopenia 07/11/2013    Priority: Medium  . CMC arthritis, thumb, degenerative 06/23/2013    Priority: Medium  . Nicotine dependence 07/11/2013  . HCAP (healthcare-associated pneumonia) 07/11/2013  . DJD (degenerative joint disease) 07/11/2013  . DDD (degenerative disc disease) 07/11/2013  . Anemia 07/11/2013  . Narcotic abuse 07/11/2013  . Hyponatremia 07/10/2013  . Hypokalemia 07/10/2013  . Depressive disorder, not elsewhere classified 02/13/2013  . ALCOHOLISM 09/02/2010  . Acute alcoholic hepatitis 71/12/2692  . PANCREATITIS 09/02/2010    Patient's Medications  New Prescriptions   No medications on file  Previous Medications   ALPRAZOLAM (XANAX) 0.5 MG TABLET    Take 1 tablet (0.5 mg total) by mouth at bedtime as needed for anxiety.   CEFAZOLIN (ANCEF) 1 G INJECTION    Inject 2,000 mg (2 g total) into the vein every 8 (eight) hours.   CLONIDINE (CATAPRES) 0.1 MG TABLET    Take 0.1 mg by mouth 3 (three) times daily.   GABAPENTIN (NEURONTIN) 300 MG CAPSULE    Take 600 mg by mouth 5 (five) times daily.   HYDROCHLOROTHIAZIDE (HYDRODIURIL) 25 MG TABLET    Take 25 mg by mouth daily.   HYDROXYZINE (ATARAX/VISTARIL) 50 MG TABLET    Take 50 mg by mouth at bedtime as needed for anxiety (sleep).   IBUPROFEN (ADVIL,MOTRIN) 200 MG TABLET    Take 400 mg by mouth every 6 (six) hours as needed for fever or moderate pain.   LORATADINE (CLARITIN) 10 MG TABLET    Take 1 tablet (10 mg total) by mouth daily.   METHOCARBAMOL (ROBAXIN) 500 MG TABLET    Take 1 tablet (500 mg total) by mouth every 6 (six) hours as needed for muscle spasms.   OXYCODONE (OXY IR/ROXICODONE) 5 MG IMMEDIATE RELEASE TABLET    Take 1-2 tablets (5-10 mg total) by mouth every 3  (three) hours as needed for moderate pain.   OXYCODONE-ACETAMINOPHEN (PERCOCET) 10-325 MG PER TABLET    Take 1 tablet by mouth every 4 (four) hours as needed for pain.  Modified Medications   No medications on file  Discontinued Medications   No medications on file    Subjective: Amy Villa is a 48 y.o. female who underwent left carpometacarpal fusion of her left thumb with bone graft and wire fixation on December 5. 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 Five days ago she began to notice increased pain in her left hand and it had some bloody drainage on her gauze dressing. That evening she began to have high fevers up to 103.7 associated with chills. She continued to do poorly over the next few days and states that she felt so bad she could not keep scheduled visits with Dr. Amedeo Plenty. She was admitted for incision and drainage and hardware removal. Dr. Amedeo Plenty noted evidence of bone infection. Operative Gram stain showed gram-positive cocci in pairs and cultures grew MSSA. She is currently receiving IV cefazolin at home. She is now completed 29 days of total antibiotic therapy. She has had no problems tolerating her PICC or cefazolin. She had noted some tinnitus while receiving IV vancomycin in the hospital but this has  now resolved.  Her left hand is currently casted. She tells me that Dr. Amedeo Plenty told her that her infection was doing much better when she last saw him. 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 She states that she has developed panic attacks related to having the cast on and has been taking Xanax.  Review of Systems: Pertinent items are noted in HPI.  Past Medical History  Diagnosis Date  . Arthritis     neck  . Carpal tunnel syndrome on both sides 02/2012  . Wears partial dentures     lower  . Dental crowns present     x  2 upper front  . History of MRSA infection   . Fluid retention   . Hypertension   . Contact lens/glasses fitting     wears contacts or glasses    History  Substance Use Topics  . Smoking status: Former Smoker -- 1.00 packs/day for 10 years    Types: Cigarettes    Quit date: 06/20/2012  . Smokeless tobacco: Never Used     Comment: quit smoking 1-2 yrs. ago  . Alcohol Use: No     Comment: stopped alcohol-2014-6/14    Family History  Problem Relation Age of Onset  . Diabetes Mother   . Lung cancer    . Colon cancer      neg. family history  . Colon polyps Sister     No Known Allergies  Objective: Temp: 98.3 F (36.8 C) (01/19 1532) Temp src: Oral (01/19 1532) BP: 118/80 mmHg (01/19 1532) Pulse Rate: 82 (01/19 1532)  General: He appears improved compared to when I last saw her in the hospital. Her husband is with her today Skin: Right arm PICC site looks good Left hand and arm are casted. She has no obvious swelling of her fingers and appears to have good range of motion  Lab Results Lab Results  Component Value Date   CRP 15.7* 07/10/2013   Lab Results  Component Value Date   ESRSEDRATE 55* 07/10/2013     Assessment: She seems to be improving clinically on therapy for postoperative MSSA wound infection with osteomyelitis of her left hand. I will repeat her inflammatory markers today and continue IV cefazolin for at least 13 more days. I will consider extending antibiotic therapy with oral cephalexin after 6 weeks of total IV antibiotic therapy.  Plan: 1. Continue IV cefazolin for now 2. Repeat sedimentation rate and C-reactive protein 3. Followup next month   Michel Bickers, MD [facility] for Infectious [place] [facility] [phone] pager   [phone] cell 08/07/2013, 4:16 PM  Addendum:  Lab Results  Component Value Date   CRP <0.5 08/07/2013   Lab Results  Component Value Date   ESRSEDRATE 15 08/07/2013   Her inflammatory markers  are improving. I will have her complete 6 weeks of IV cefazolin on February 2 and then have the PICC removed. Given the deep involvement of bone and joint in her hand I will give her 2 weeks of oral cephalexin after completing IV cefazolin.  Michel Bickers, MD [facility] for Infectious [place] [facility] [phone] pager   [phone] cell 08/09/2013, 1:21 PM

## 2013-08-08 LAB — FUNGUS CULTURE W SMEAR: Fungal Smear: NONE SEEN

## 2013-08-08 LAB — SEDIMENTATION RATE: SED RATE: 15 mm/h (ref 0–22)

## 2013-08-09 ENCOUNTER — Telehealth: Payer: Self-pay | Admitting: *Deleted

## 2013-08-09 ENCOUNTER — Encounter: Payer: Self-pay | Admitting: Internal Medicine

## 2013-08-09 MED ORDER — CEFAZOLIN SODIUM 1 G IV SOLR: 2.0000 g | Freq: Three times a day (TID) | INTRAVENOUS | Status: AC

## 2013-08-09 MED ORDER — CEPHALEXIN 500 MG PO CAPS: ORAL_CAPSULE | ORAL | Status: DC

## 2013-08-09 NOTE — Addendum Note (Signed)
Addended by: Michel Bickers on: 08/09/2013 01:25 PM   Modules accepted: Orders

## 2013-08-09 NOTE — Telephone Encounter (Signed)
Verbal order given to [place], [place].  Read back and confirmed.  Stop Ancef 08/21/13 and pull PICC.

## 2013-08-21 ENCOUNTER — Telehealth: Payer: Self-pay

## 2013-08-21 NOTE — Telephone Encounter (Signed)
Patient is calling regarding oral antibiotics.  He is due to have PICC removed today and oral antibiotic have not been sent to pharmacy .   I will call Keflex which was ordered at last office visit today to [place].   Mikaele Stecher K Sanskriti Greenlaw,RN

## 2013-08-23 ENCOUNTER — Telehealth: Payer: Self-pay | Admitting: *Deleted

## 2013-08-23 NOTE — Telephone Encounter (Signed)
Patient called to report that she has started to have involuntery arm and leg movements in her sleep. She advised this started when she started taking the Keflex. She advised it is not disruptive to her sleep but it bothers her husband. She also advised that she called the pharmacy and was told it was probably the Gabepinten causing the issue. She just wanted [place] to know what is going on as she still intends to take the medication. Advised her will make a note and if anything changes or she starts having trouble sleeping she can call the office back. She advised she understands.

## 2013-08-30 ENCOUNTER — Encounter: Payer: Self-pay | Admitting: Internal Medicine

## 2013-08-30 ENCOUNTER — Telehealth: Payer: Self-pay | Admitting: *Deleted

## 2013-08-30 NOTE — Telephone Encounter (Signed)
Patient called to see how long she would be on her oral antibiotic. Advised her that depends on how well her infection responds to the treatment. She advised she is asking because she is getting injections from Dr [facility] and he will not continue the injections until she is off the Keflex and it is contraindicated. She is in pain and wants to continue the shots. She advised if it will be a short course she will suck it up and wait it out. If it is a longer therapy she would like to change antibiotic to something that she can take with the injection. She is aware of her appt 09/05/13 but does not want to wait until then to get an answer. Advised her the infection usually dictates the treatment but there are some instances where there are other options and sometimes not. I advised her will send the doctor a message and let her know what he says.   Best number to reach her is [phone]

## 2013-08-30 NOTE — Telephone Encounter (Signed)
My recommendation to [name] was to continue oral cephalexin through February 16 to complete a 2 full weeks of antibiotic therapy.

## 2013-08-31 NOTE — Telephone Encounter (Signed)
Any decision about the potential benefits and risks of injections will need to come from Dr. Nelva Bush.

## 2013-08-31 NOTE — Telephone Encounter (Signed)
Can the patient have neck injections while taking antibiotics? Dr. Nelva Bush will not do it unless Dr. Megan Salon recommends it.

## 2013-09-04 ENCOUNTER — Encounter: Payer: Self-pay | Admitting: Internal Medicine

## 2013-09-05 ENCOUNTER — Telehealth: Payer: Self-pay

## 2013-09-05 ENCOUNTER — Ambulatory Visit: Payer: Self-pay | Admitting: Internal Medicine

## 2013-09-05 NOTE — Telephone Encounter (Signed)
Patient missed appointment due to inclement weather.  She has finished the initial course of oral antibiotics and would like to know if she is to continue.  She does not want to wait for return visit in March.  Please advise.

## 2013-09-06 NOTE — Telephone Encounter (Signed)
It is okay for her to be off of antibiotics now. Please have her call if she develops any new problems or concerns suggesting recurrent infection if between now and her followup appointment with me on March 17.

## 2013-09-07 NOTE — Telephone Encounter (Signed)
Notified patient. She verbalized understanding. Landis Gandy, RN

## 2013-09-12 ENCOUNTER — Encounter: Payer: Self-pay | Admitting: Internal Medicine

## 2013-10-02 ENCOUNTER — Telehealth: Payer: Self-pay | Admitting: *Deleted

## 2013-10-02 NOTE — Telephone Encounter (Signed)
I have an opening this Thursday, March 19.

## 2013-10-02 NOTE — Telephone Encounter (Signed)
Pt wondering whether she needs to be seen tomorrow to be released for Dr. Nelva Bush to restart "injections in her neck for migraines."  Pt has been seeing Dr. Amedeo Plenty for her hand and has been started on physical therapy.  Completed 6 weeks oral antibiotics last month.  MD please advise about needing f/u office visit and pt being released for Dr. Nelva Bush to restart injections.

## 2013-10-03 ENCOUNTER — Ambulatory Visit: Payer: Self-pay | Admitting: Internal Medicine

## 2013-10-03 NOTE — Telephone Encounter (Signed)
Patient scheduled for 10/19/2013.

## 2013-10-19 ENCOUNTER — Encounter: Payer: Self-pay | Admitting: Internal Medicine

## 2013-10-19 ENCOUNTER — Ambulatory Visit (INDEPENDENT_AMBULATORY_CARE_PROVIDER_SITE_OTHER): Payer: BC Managed Care – PPO | Admitting: Internal Medicine

## 2013-10-19 VITALS — BP 136/88 | HR 80 | Temp 98.6°F | Ht 63.0 in | Wt 121.2 lb

## 2013-10-19 DIAGNOSIS — M869 Osteomyelitis, unspecified: Secondary | ICD-10-CM

## 2013-10-19 LAB — C-REACTIVE PROTEIN: CRP: 0.8 mg/dL — ABNORMAL HIGH (ref <0.60)

## 2013-10-19 LAB — SEDIMENTATION RATE: Sed Rate: 20 mm/hr (ref 0–22)

## 2013-10-19 NOTE — Progress Notes (Signed)
Patient ID: Amy Villa, female   DOB: 14-Jun-1966, 48 y.o.   MRN: [id number]         [facility]  Patient Active Problem List   Diagnosis Date Noted  . Bone infection of left hand 07/10/2013    Priority: High  . Leukopenia 07/11/2013    Priority: Medium  . CMC arthritis, thumb, degenerative 06/23/2013    Priority: Medium  . Nicotine dependence 07/11/2013  . HCAP (healthcare-associated pneumonia) 07/11/2013  . DJD (degenerative joint disease) 07/11/2013  . DDD (degenerative disc disease) 07/11/2013  . Anemia 07/11/2013  . Narcotic abuse 07/11/2013  . Hyponatremia 07/10/2013  . Hypokalemia 07/10/2013  . Depressive disorder, not elsewhere classified 02/13/2013  . ALCOHOLISM 09/02/2010  . Acute alcoholic hepatitis 62/95/2841  . PANCREATITIS 09/02/2010    Patient's Medications  New Prescriptions   No medications on file  Previous Medications   ALPRAZOLAM (XANAX) 0.5 MG TABLET    Take 1 tablet (0.5 mg total) by mouth at bedtime as needed for anxiety.   CLONIDINE (CATAPRES) 0.1 MG TABLET    Take 0.1 mg by mouth 3 (three) times daily.   GABAPENTIN (NEURONTIN) 300 MG CAPSULE    Take 600 mg by mouth 5 (five) times daily.   HYDROCHLOROTHIAZIDE (HYDRODIURIL) 25 MG TABLET    Take 25 mg by mouth daily.   HYDROXYZINE (ATARAX/VISTARIL) 50 MG TABLET    Take 50 mg by mouth at bedtime as needed for anxiety (sleep).   IBUPROFEN (ADVIL,MOTRIN) 200 MG TABLET    Take 400 mg by mouth every 6 (six) hours as needed for fever or moderate pain.   LORATADINE (CLARITIN) 10 MG TABLET    Take 1 tablet (10 mg total) by mouth daily.   METHOCARBAMOL (ROBAXIN) 500 MG TABLET    Take 1 tablet (500 mg total) by mouth every 6 (six) hours as needed for muscle spasms.   OXYCODONE (OXY IR/ROXICODONE) 5 MG IMMEDIATE RELEASE TABLET    Take 1-2 tablets (5-10 mg total) by mouth every 3 (three) hours as needed for moderate pain.   OXYCODONE-ACETAMINOPHEN (PERCOCET) 10-325 MG PER TABLET     Take 1 tablet by mouth every 4 (four) hours as needed for pain.  Modified Medications   No medications on file  Discontinued Medications   CEPHALEXIN (KEFLEX) 500 MG CAPSULE    Take 1 capsule (500 mg) by mouth 4 times daily starting on February 2.    Subjective: Amy Villa is in for her first visit in 2 months. She has missed several visits with me. She completed 2 months of antibiotics for MSSA infection of her left hand on February 16. He is still having some intermittent pain and stiffness in her hand but is improving with physical therapy.  Review of Systems: Pertinent items are noted in HPI.  Past Medical History  Diagnosis Date  . Arthritis     neck  . Carpal tunnel syndrome on both sides 02/2012  . Wears partial dentures     lower  . Dental crowns present     x 2 upper front  . History of MRSA infection   . Fluid retention   . Hypertension   . Contact lens/glasses fitting     wears contacts or glasses    History  Substance Use Topics  . Smoking status: Former Smoker -- 1.00 packs/day for 10 years    Types: Cigarettes    Quit date: 06/20/2012  . Smokeless tobacco: Never Used  Comment: quit smoking 1-2 yrs. ago  . Alcohol Use: No     Comment: stopped alcohol-2014-6/14    Family History  Problem Relation Age of Onset  . Diabetes Mother   . Lung cancer    . Colon cancer      neg. family history  . Colon polyps Sister     No Known Allergies  Objective: Temp: 98.6 F (37 C) (04/02 1129) Temp src: Oral (04/02 1129) BP: 136/88 mmHg (04/02 1129) Pulse Rate: 80 (04/02 1129)  General: She is alert and in no distress Skin: Tanned Lungs: Clear Cor: Regular S1-S2 with no murmurs Left hand incision is fully healed. Some tenderness with palpation around the left thenar eminence but no signs of active infection  Lab Results Lab Results  Component Value Date   CRP <0.5 08/07/2013   Lab Results  Component Value Date   ESRSEDRATE 15 08/07/2013       Assessment: I suspect that her infection is cured. I will continue observation off of antibiotics we'll repeat her inflammatory markers.  Plan: 1. Continue observation off of antibiotics 2. Repeat sedimentation rate and C-reactive protein 3. Followup in 4 weeks   Michel Bickers, MD [facility] [place] [phone] pager   [phone] cell 10/19/2013, 11:58 AM

## 2013-11-16 ENCOUNTER — Ambulatory Visit: Payer: Self-pay | Admitting: Internal Medicine

## 2014-09-11 ENCOUNTER — Emergency Department (HOSPITAL_COMMUNITY)
Admission: EM | Admit: 2014-09-11 | Discharge: 2014-09-11 | Disposition: A | Discharge status: Home / Self Care | Payer: 59 | Attending: Emergency Medicine | Admitting: Emergency Medicine

## 2014-09-11 ENCOUNTER — Emergency Department (HOSPITAL_COMMUNITY): Payer: 59

## 2014-09-11 ENCOUNTER — Encounter (HOSPITAL_COMMUNITY): Payer: Self-pay

## 2014-09-11 DIAGNOSIS — M47892 Other spondylosis, cervical region: Secondary | ICD-10-CM | POA: Insufficient documentation

## 2014-09-11 DIAGNOSIS — I10 Essential (primary) hypertension: Secondary | ICD-10-CM | POA: Diagnosis not present

## 2014-09-11 DIAGNOSIS — Z8669 Personal history of other diseases of the nervous system and sense organs: Secondary | ICD-10-CM | POA: Diagnosis not present

## 2014-09-11 DIAGNOSIS — Y9389 Activity, other specified: Secondary | ICD-10-CM | POA: Insufficient documentation

## 2014-09-11 DIAGNOSIS — Z98811 Dental restoration status: Secondary | ICD-10-CM | POA: Insufficient documentation

## 2014-09-11 DIAGNOSIS — Y998 Other external cause status: Secondary | ICD-10-CM | POA: Insufficient documentation

## 2014-09-11 DIAGNOSIS — Z8614 Personal history of Methicillin resistant Staphylococcus aureus infection: Secondary | ICD-10-CM | POA: Insufficient documentation

## 2014-09-11 DIAGNOSIS — W1839XA Other fall on same level, initial encounter: Secondary | ICD-10-CM | POA: Diagnosis not present

## 2014-09-11 DIAGNOSIS — S99912A Unspecified injury of left ankle, initial encounter: Secondary | ICD-10-CM | POA: Insufficient documentation

## 2014-09-11 DIAGNOSIS — S99922A Unspecified injury of left foot, initial encounter: Secondary | ICD-10-CM | POA: Insufficient documentation

## 2014-09-11 DIAGNOSIS — Z720 Tobacco use: Secondary | ICD-10-CM | POA: Diagnosis not present

## 2014-09-11 DIAGNOSIS — Y9289 Other specified places as the place of occurrence of the external cause: Secondary | ICD-10-CM | POA: Diagnosis not present

## 2014-09-11 DIAGNOSIS — Z79899 Other long term (current) drug therapy: Secondary | ICD-10-CM | POA: Diagnosis not present

## 2014-09-11 DIAGNOSIS — M79672 Pain in left foot: Secondary | ICD-10-CM

## 2014-09-11 MED ORDER — OXYCODONE-ACETAMINOPHEN 5-325 MG PO TABS: 1.0000 | ORAL_TABLET | ORAL | Status: DC | PRN

## 2014-09-11 MED ORDER — KETOROLAC TROMETHAMINE 60 MG/2ML IM SOLN
30.0000 mg | Freq: Once | INTRAMUSCULAR | Status: AC
  Administered 2014-09-11: 30 mg via INTRAMUSCULAR
  Filled 2014-09-11: qty 2

## 2014-09-11 MED ORDER — HYDROMORPHONE HCL 1 MG/ML IJ SOLN
1.0000 mg | Freq: Once | INTRAMUSCULAR | Status: AC
  Administered 2014-09-11: 1 mg via INTRAMUSCULAR
  Filled 2014-09-11: qty 1

## 2014-09-11 NOTE — ED Notes (Addendum)
Patient states she was stepping off of a deck 3 days ago and twisted her left foot. Patient has swelling, numbness. tingling and pain to the left foot.

## 2014-09-11 NOTE — ED Notes (Signed)
MD at bedside. 

## 2014-09-11 NOTE — ED Provider Notes (Signed)
CSN: [id number]     Arrival date & time 09/11/14  3500 History   First MD Initiated Contact with Patient 09/11/14 [phone]     Chief Complaint  Patient presents with  . Foot Pain     (Consider location/radiation/quality/duration/timing/severity/associated sxs/prior Treatment) Patient is a 49 y.o. female presenting with leg pain.  Leg Pain Location:  Foot Injury: yes   Mechanism of injury: fall   Fall:    Fall occurred:  Down stairs Foot location:  L foot Pain details:    Quality:  Aching   Radiates to:  Does not radiate   Severity:  Moderate   Onset quality:  Sudden   Duration:  3 days   Timing:  Constant   Progression:  Unchanged Chronicity:  New Relieved by:  Nothing Worsened by:  Nothing tried Ineffective treatments:  None tried Associated symptoms: stiffness and swelling   Associated symptoms: no back pain, no fever, no itching and no numbness     Past Medical History  Diagnosis Date  . Arthritis     neck  . Carpal tunnel syndrome on both sides 02/2012  . Wears partial dentures     lower  . Dental crowns present     x 2 upper front  . History of MRSA infection   . Fluid retention   . Hypertension   . Contact lens/glasses fitting     wears contacts or glasses   Past Surgical History  Procedure Laterality Date  . Laparoscopic vaginal hysterectomy  02/24/2010  . Leep      x 2  . Laparoscopy  06/2010    for adhesions  . Lumbar disc surgery  03/23/2003    left L5-S1 discectomy with microdissection  . Lumbar disc surgery  05/23/2003    redo discectomy L5-S1 with microdissection  . Carpal tunnel release  03/17/2012    Procedure: CARPAL TUNNEL RELEASE;  Surgeon: Roseanne Kaufman, MD;  Location: [place];  Service: Orthopedics;  Laterality: Bilateral;  left limited open carpal tunnel release. right carpal tunnel injection with 1cc of celestone and 2cc of marcaine.25%  . Carpal tunnel release  06/30/2012    Procedure: CARPAL TUNNEL RELEASE;  Surgeon:  Roseanne Kaufman, MD;  Location: [place];  Service: Orthopedics;  Laterality: Right;  RIGHT LIMITED OPEN CARPAL TUNNEL RELEASE  . Carpometacarpal (cmc) fusion of thumb Left 06/23/2013    Procedure: LEFT CARPOMETACARPEL ([place]) FUSION OF THUMB WITH AUTOGRAFT FROM RADIUS AND REPAIR AS NECESSARY;  Surgeon: Roseanne Kaufman, MD;  Location: [place];  Service: Orthopedics;  Laterality: Left;  . Incision and drainage of wound Left 07/10/2013    Procedure: IRRIGATION AND DEBRIDEMENT WOUND left wrist  ;  Surgeon: Roseanne Kaufman, MD;  Location: [facility];  Service: Orthopedics;  Laterality: Left;  . Hardware removal Left 07/10/2013    Procedure: HARDWARE REMOVAL;  Surgeon: Roseanne Kaufman, MD;  Location: [facility];  Service: Orthopedics;  Laterality: Left;   Family History  Problem Relation Age of Onset  . Diabetes Mother   . Lung cancer    . Colon cancer      neg. family history  . Colon polyps Sister    History  Substance Use Topics  . Smoking status: Current Some Day Smoker -- 1.00 packs/day for 10 years    Types: Cigarettes  . Smokeless tobacco: Never Used     Comment: quit smoking 1-2 yrs. ago  . Alcohol Use: No     Comment: stopped alcohol-2014-6/14   OB  History    No data available     Review of Systems  Constitutional: Negative for fever.  Musculoskeletal: Positive for stiffness. Negative for back pain.  Skin: Negative for itching.  All other systems reviewed and are negative.     Allergies  Review of patient's allergies indicates no known allergies.  Home Medications   Prior to Admission medications   Medication Sig Start Date End Date Taking? Authorizing Provider  amitriptyline (ELAVIL) 25 MG tablet Take 25-50 mg by mouth at bedtime as needed for sleep.   Yes Historical Provider, MD  clonazePAM (KLONOPIN) 0.5 MG tablet Take 0.5-1 mg by mouth at bedtime.    Yes Historical Provider, MD  FLUoxetine (PROZAC) 20 MG capsule Take 60 mg by mouth daily.    Yes Historical  Provider, MD  gabapentin (NEURONTIN) 300 MG capsule Take 600 mg by mouth 5 (five) times daily. 02/14/13  Yes Waylan Boga, NP  hydrochlorothiazide (HYDRODIURIL) 25 MG tablet Take 25 mg by mouth daily. 02/14/13  Yes Waylan Boga, NP  ibuprofen (ADVIL,MOTRIN) 200 MG tablet Take 400 mg by mouth every 6 (six) hours as needed for fever or moderate pain.   Yes Historical Provider, MD  loratadine (CLARITIN) 10 MG tablet Take 1 tablet (10 mg total) by mouth daily. Patient taking differently: Take 10 mg by mouth 2 (two) times daily.  02/14/13  Yes Waylan Boga, NP  omeprazole (PRILOSEC) 20 MG capsule Take 20 mg by mouth 2 (two) times daily before a meal.   Yes Historical Provider, MD  oxyCODONE-acetaminophen (PERCOCET) 10-325 MG per tablet Take 1 tablet by mouth every 4 (four) hours as needed for pain. Patient taking differently: Take 1 tablet by mouth 4 (four) times daily.  06/23/13  Yes Roseanne Kaufman, MD  rOPINIRole (REQUIP) 1 MG tablet Take 2 mg by mouth at bedtime as needed (for sleep).    Yes Historical Provider, MD  ALPRAZolam Duanne Moron) 0.5 MG tablet Take 1 tablet (0.5 mg total) by mouth at bedtime as needed for anxiety. Patient not taking: Reported on 09/11/2014 07/12/13   Roseanne Kaufman, MD  methocarbamol (ROBAXIN) 500 MG tablet Take 1 tablet (500 mg total) by mouth every 6 (six) hours as needed for muscle spasms. Patient not taking: Reported on 09/11/2014 07/12/13   Roseanne Kaufman, MD  oxyCODONE (OXY IR/ROXICODONE) 5 MG immediate release tablet Take 1-2 tablets (5-10 mg total) by mouth every 3 (three) hours as needed for moderate pain. Patient not taking: Reported on 09/11/2014 07/12/13   Roseanne Kaufman, MD  oxyCODONE-acetaminophen (PERCOCET/ROXICET) 5-325 MG per tablet Take 1-2 tablets by mouth every 4 (four) hours as needed for severe pain. 09/11/14   Debby Freiberg, MD   BP 124/78 mmHg  Pulse 89  Temp(Src) 98 F (36.7 C) (Oral)  Resp 16  SpO2 98% Physical Exam  Constitutional: She is oriented to  person, place, and time. She appears well-developed and well-nourished.  HENT:  Head: Normocephalic and atraumatic.  Right Ear: External ear normal.  Left Ear: External ear normal.  Eyes: Conjunctivae and EOM are normal. Pupils are equal, round, and reactive to light.  Neck: Normal range of motion. Neck supple.  Cardiovascular: Normal rate, regular rhythm, normal heart sounds and intact distal pulses.   Pulmonary/Chest: Effort normal and breath sounds normal.  Abdominal: Soft. Bowel sounds are normal. There is no tenderness.  Musculoskeletal: Normal range of motion.       Left ankle: She exhibits normal range of motion. Tenderness. Lateral malleolus tenderness found.  Left foot: There is tenderness and bony tenderness. There is normal range of motion.  Neurological: She is alert and oriented to person, place, and time.  Skin: Skin is warm and dry.  Vitals reviewed.   ED Course  Procedures (including critical care time) Labs Review Labs Reviewed - No data to display  Imaging Review Dg Ankle Complete Left  09/11/2014   CLINICAL DATA:  Pain, twisted foot stepped from deck Saturday  EXAM: LEFT ANKLE COMPLETE - 3+ VIEW  COMPARISON:  None.  FINDINGS: Three views of the left ankle submitted. No acute fracture or subluxation. Ankle mortise is preserved. No radiopaque foreign body. Small plantar spur of calcaneus.  IMPRESSION: Negative.  Small plantar spur of calcaneus.   Electronically Signed   By: Lahoma Crocker M.D.   On: 09/11/2014 09:17   Dg Foot Complete Left  09/11/2014   CLINICAL DATA:  Twisted left foot and ankle on Saturday stepping from deck  EXAM: LEFT FOOT - COMPLETE 3+ VIEW  COMPARISON:  None.  FINDINGS: Three views of the left foot submitted. There is no displaced fracture or subluxation. There is subtle lucent line at the base of second and third metatarsal. Subtle nondisplaced fracture cannot be excluded. Clinical correlation is necessary.  IMPRESSION: No displaced fracture or  subluxation. Subtle linear lucency at the base of second and third metatarsal. Subtle nondisplaced fracture cannot be excluded. Clinical correlation is necessary.   Electronically Signed   By: Lahoma Crocker M.D.   On: 09/11/2014 09:16     EKG Interpretation None      MDM   Final diagnoses:  Left foot pain    49 y.o. female without pertinent PMH presents with left foot pain after a fall 3 days ago. Patient has been ambulatory since that time. She denies systemic symptoms. On arrival vital signs and physical exam as above. Patient has intact range of motion, neurovascularly intact.  XR with ? Nondisplaced fracture.  Post op shoe given.  Pt to fu with orthopedics.  DC home in stable condition with small amount of percocet.  I have reviewed all laboratory and imaging studies if ordered as above  1. Left foot pain         Debby Freiberg, MD 09/11/14 [phone]

## 2014-09-11 NOTE — ED Notes (Signed)
Pt escorted to discharge window. Verbalized understanding discharge instructions. In no acute distress.   

## 2014-09-11 NOTE — Discharge Instructions (Signed)
Metatarsal Fracture, Undisplaced  A metatarsal fracture is a break in the bone(s) of the foot. These are the bones of the foot that connect your toes to the bones of the ankle.  DIAGNOSIS   The diagnoses of these fractures are usually made with X-rays. If there are problems in the forefoot and x-rays are normal a later bone scan will usually make the diagnosis.   TREATMENT AND HOME CARE INSTRUCTIONS  · Treatment may or may not include a cast or walking shoe. When casts are needed the use is usually for short periods of time so as not to slow down healing with muscle wasting (atrophy).  · Activities should be stopped until further advised by your caregiver.  · Wear shoes with adequate shock absorbing capabilities and stiff soles.  · Alternative exercise may be undertaken while waiting for healing. These may include bicycling and swimming, or as your caregiver suggests.  · It is important to keep all follow-up visits or specialty referrals. The failure to keep these appointments could result in improper bone healing and chronic pain or disability.  · Warning: Do not drive a car or operate a motor vehicle until your caregiver specifically tells you it is safe to do so.  IF YOU DO NOT HAVE A CAST OR SPLINT:  · You may walk on your injured foot as tolerated or advised.  · Do not put any weight on your injured foot for as long as directed by your caregiver. Slowly increase the amount of time you walk on the foot as the pain allows or as advised.  · Use crutches until you can bear weight without pain. A gradual increase in weight bearing may help.  · Apply ice to the injury for 15-20 minutes each hour while awake for the first 2 days. Put the ice in a plastic bag and place a towel between the bag of ice and your skin.  · Only take over-the-counter or prescription medicines for pain, discomfort, or fever as directed by your caregiver.  SEEK IMMEDIATE MEDICAL CARE IF:   · Your cast gets damaged or breaks.  · You have  continued severe pain or more swelling than you did before the cast was put on, or the pain is not controlled with medications.  · Your skin or nails below the injury turn blue or grey, or feel cold or numb.  · There is a bad smell, or new stains or pus-like (purulent) drainage coming from the cast.  MAKE SURE YOU:   · Understand these instructions.  · Will watch your condition.  · Will get help right away if you are not doing well or get worse.  Document Released: 03/28/2002 Document Revised: 09/28/2011 Document Reviewed: 02/17/2008  Exit[employer]® Patient Information ©2015 [employer]. This information is not intended to replace advice given to you by your health care provider. Make sure you discuss any questions you have with your health care provider.

## 2014-09-18 ENCOUNTER — Other Ambulatory Visit: Payer: Self-pay | Admitting: Gynecology

## 2014-09-20 LAB — CYTOLOGY - PAP

## 2015-01-30 ENCOUNTER — Other Ambulatory Visit: Payer: Self-pay | Admitting: *Deleted

## 2015-01-30 DIAGNOSIS — I83893 Varicose veins of bilateral lower extremities with other complications: Secondary | ICD-10-CM

## 2015-03-07 ENCOUNTER — Other Ambulatory Visit: Payer: Self-pay | Admitting: Pain Medicine

## 2015-03-07 DIAGNOSIS — M542 Cervicalgia: Secondary | ICD-10-CM

## 2015-03-16 ENCOUNTER — Other Ambulatory Visit: Payer: Self-pay

## 2015-03-28 ENCOUNTER — Other Ambulatory Visit: Payer: Self-pay

## 2015-03-29 ENCOUNTER — Ambulatory Visit
Admission: RE | Admit: 2015-03-29 | Discharge: 2015-03-29 | Disposition: A | Discharge status: Home / Self Care | Payer: 59 | Source: Ambulatory Visit | Attending: Pain Medicine | Admitting: Pain Medicine

## 2015-03-29 DIAGNOSIS — M542 Cervicalgia: Secondary | ICD-10-CM

## 2015-04-19 ENCOUNTER — Encounter: Payer: Self-pay | Admitting: Vascular Surgery

## 2015-04-23 ENCOUNTER — Ambulatory Visit (INDEPENDENT_AMBULATORY_CARE_PROVIDER_SITE_OTHER): Payer: 59 | Admitting: Vascular Surgery

## 2015-04-23 ENCOUNTER — Ambulatory Visit (HOSPITAL_COMMUNITY)
Admission: RE | Admit: 2015-04-23 | Discharge: 2015-04-23 | Disposition: A | Discharge status: Home / Self Care | Payer: 59 | Source: Ambulatory Visit | Attending: Vascular Surgery | Admitting: Vascular Surgery

## 2015-04-23 ENCOUNTER — Encounter: Payer: Self-pay | Admitting: Vascular Surgery

## 2015-04-23 VITALS — BP 142/94 | HR 75 | Temp 97.0°F | Resp 18 | Ht 63.0 in | Wt 144.8 lb

## 2015-04-23 DIAGNOSIS — I10 Essential (primary) hypertension: Secondary | ICD-10-CM | POA: Diagnosis not present

## 2015-04-23 DIAGNOSIS — I83893 Varicose veins of bilateral lower extremities with other complications: Secondary | ICD-10-CM | POA: Diagnosis not present

## 2015-04-23 NOTE — Progress Notes (Signed)
Filed Vitals:   04/23/15 1300 04/23/15 1307  BP: 148/98 142/94  Pulse: 75   Temp: 97 F (36.1 C)   TempSrc: Oral   Resp: 18   Height: 5\' 3"  (1.6 m)   Weight: 144 lb 12.8 oz (65.681 kg)   SpO2: 100%

## 2015-04-23 NOTE — Progress Notes (Signed)
Patient name: Amy Villa MRN: [id number] DOB: [birth year] Sex: female   Referred by: Belenda Cruise  Reason for referral:  Chief Complaint  Patient presents with  . New Evaluation    bilateral leg pain and swelling (L>R) for 1 year,  pain worse with prolonged standing       HISTORY OF PRESENT ILLNESS: Patient presents today for evaluation of lower extremity venous pathology. She has several components associated with this. She does report achy sensation in both legs for prolonged standing. She does have a long history of significant telangiectasia. She has no history of DVT no history of bleeding from her telangiectasia she does not have any prior use of compression  Past Medical History  Diagnosis Date  . Arthritis     neck  . Carpal tunnel syndrome on both sides 02/2012  . Wears partial dentures     lower  . Dental crowns present     x 2 upper front  . History of MRSA infection   . Fluid retention   . Hypertension   . Contact lens/glasses fitting     wears contacts or glasses    Past Surgical History  Procedure Laterality Date  . Laparoscopic vaginal hysterectomy  02/24/2010  . [place]      x 2  . Laparoscopy  06/2010    for adhesions  . Lumbar disc surgery  03/23/2003    left L5-S1 discectomy with microdissection  . Lumbar disc surgery  05/23/2003    redo discectomy L5-S1 with microdissection  . Carpal tunnel release  03/17/2012    Procedure: CARPAL TUNNEL RELEASE;  Surgeon: Roseanne Kaufman, MD;  Location: [place];  Service: Orthopedics;  Laterality: Bilateral;  left limited open carpal tunnel release. right carpal tunnel injection with 1cc of celestone and 2cc of marcaine.25%  . Carpal tunnel release  06/30/2012    Procedure: CARPAL TUNNEL RELEASE;  Surgeon: Roseanne Kaufman, MD;  Location: [place];  Service: Orthopedics;  Laterality: Right;  RIGHT LIMITED OPEN CARPAL TUNNEL RELEASE  . Carpometacarpal (cmc) fusion of thumb Left 06/23/2013    Procedure: LEFT  CARPOMETACARPEL ([place]) FUSION OF THUMB WITH AUTOGRAFT FROM RADIUS AND REPAIR AS NECESSARY;  Surgeon: Roseanne Kaufman, MD;  Location: [place];  Service: Orthopedics;  Laterality: Left;  . Incision and drainage of wound Left 07/10/2013    Procedure: IRRIGATION AND DEBRIDEMENT WOUND left wrist  ;  Surgeon: Roseanne Kaufman, MD;  Location: [facility];  Service: Orthopedics;  Laterality: Left;  . Hardware removal Left 07/10/2013    Procedure: HARDWARE REMOVAL;  Surgeon: Roseanne Kaufman, MD;  Location[facility];  Service: Orthopedics;  Laterality: Left;    Social History   Social History  . Marital Status: Married    Spouse Name: N/A  . Number of Children: N/A  . Years of Education: N/A   Occupational History  . cleaning    Social History Main Topics  . Smoking status: Current Some Day Smoker -- 0.50 packs/day for 10 years    Types: Cigarettes  . Smokeless tobacco: Never Used     Comment: quit smoking 1-2 yrs. ago  . Alcohol Use: No     Comment: stopped alcohol-2014-6/14  . Drug Use: No  . Sexual Activity: No   Other Topics Concern  . Not on file   Social History Narrative    Family History  Problem Relation Age of Onset  . Diabetes Mother   . Lung cancer    .  Colon cancer      neg. family history  . Colon polyps Sister     Allergies as of 04/23/2015  . (No Known Allergies)    Current Outpatient Prescriptions on File Prior to Visit  Medication Sig Dispense Refill  . amitriptyline (ELAVIL) 25 MG tablet Take 25-50 mg by mouth at bedtime as needed for sleep.    [name] FLUoxetine (PROZAC) 20 MG capsule Take 60 mg by mouth daily.     [name] gabapentin (NEURONTIN) 300 MG capsule Take 600 mg by mouth 5 (five) times daily.    . hydrochlorothiazide (HYDRODIURIL) 25 MG tablet Take 25 mg by mouth daily.    [name] loratadine (CLARITIN) 10 MG tablet Take 1 tablet (10 mg total) by mouth daily. (Patient taking differently: Take 10 mg by mouth 2 (two) times daily. ) 30 tablet 0  . omeprazole  (PRILOSEC) 20 MG capsule Take 20 mg by mouth 2 (two) times daily before a meal.    . rOPINIRole (REQUIP) 1 MG tablet Take 2 mg by mouth at bedtime as needed (for sleep).     . ALPRAZolam (XANAX) 0.5 MG tablet Take 1 tablet (0.5 mg total) by mouth at bedtime as needed for anxiety. (Patient not taking: Reported on 09/11/2014) 45 tablet 0  . clonazePAM (KLONOPIN) 0.5 MG tablet Take 0.5-1 mg by mouth at bedtime.     [name] ibuprofen (ADVIL,MOTRIN) 200 MG tablet Take 400 mg by mouth every 6 (six) hours as needed for fever or moderate pain.    . methocarbamol (ROBAXIN) 500 MG tablet Take 1 tablet (500 mg total) by mouth every 6 (six) hours as needed for muscle spasms. (Patient not taking: Reported on 09/11/2014) 50 tablet 0  . oxyCODONE-acetaminophen (PERCOCET/ROXICET) 5-325 MG per tablet Take 1-2 tablets by mouth every 4 (four) hours as needed for severe pain. (Patient not taking: Reported on 04/23/2015) 15 tablet 0   No current facility-administered medications on file prior to visit.     REVIEW OF SYSTEMS:  Positives indicated with an "X"  CARDIOVASCULAR:  [ ]  chest pain   [ ]  chest pressure   [ ]  palpitations   [ ]  orthopnea   [ ]  dyspnea on exertion   [ ]  claudication   [ ]  rest pain   [ ]  DVT   [ ]  phlebitis PULMONARY:   [x ] productive cough   [ ]  asthma   [ ]  wheezing NEUROLOGIC:   [ x] weakness  [x ] paresthesias  [ ]  aphasia  [ ]  amaurosis  [ ]  dizziness HEMATOLOGIC:   [ ]  bleeding problems   [ ]  clotting disorders MUSCULOSKELETAL:  [ ]  joint pain   [ ]  joint swelling GASTROINTESTINAL: [ ]   blood in stool  [ ]   hematemesis GENITOURINARY:  [ ]   dysuria  [ ]   hematuria PSYCHIATRIC:  [x ] history of major depression INTEGUMENTARY:  [ ]  rashes  [ ]  ulcers CONSTITUTIONAL:  [ ]  fever   [ ]  chills  PHYSICAL EXAMINATION:  General: The patient is a well-nourished female, in no acute distress. Vital signs are BP 142/94 mmHg  Pulse 75  Temp(Src) 97 F (36.1 C) (Oral)  Resp 18  Ht 5\' 3"  (1.6 m)   Wt 144 lb 12.8 oz (65.681 kg)  BMI 25.66 kg/m2  SpO2 100% Pulmonary: There is a good air exchange  Musculoskeletal: There are no major deformities.   Neurologic: No focal weakness or paresthesias are detected, Skin: There are no ulcer or rashes  noted. Psychiatric: The patient has normal affect. Cardiovascular: 2+ dorsalis pedis pulses bilaterally She does not have any swelling today. She does not have any varicosities. She does have pronounced scattered telangiectasia on both lower extremities   [facility] Vascular Lab Studies:  Ordered and Independently Reviewed reviewed these with the patient. This shows no significant deep or superficial venous reflux. She does have some reflux in her left common femoral vein and otherwise negative. No evidence of DVT  Impression and Plan:  No evidence of significant venous or arterial pathology. Patient was reassured with this. Explained that her achy sensation is not related to venous hypertension. I did explain the treatment options for telangiectasia would be sclerotherapy. These are not particularly discomforting over these and would be done for cosmetic concerns only. She understands this and will see [place] again on as-needed basis.    Curt Jews [facility] Office: [phone]

## 2016-07-22 ENCOUNTER — Encounter: Payer: Self-pay | Admitting: Physician Assistant

## 2016-07-22 ENCOUNTER — Encounter: Payer: Self-pay | Admitting: Emergency Medicine

## 2016-07-22 ENCOUNTER — Ambulatory Visit (INDEPENDENT_AMBULATORY_CARE_PROVIDER_SITE_OTHER): Payer: Self-pay | Admitting: Physician Assistant

## 2016-07-22 VITALS — BP 142/98 | HR 89 | Temp 99.0°F | Resp 16 | Ht 62.0 in | Wt 130.0 lb

## 2016-07-22 DIAGNOSIS — D649 Anemia, unspecified: Secondary | ICD-10-CM | POA: Diagnosis not present

## 2016-07-22 DIAGNOSIS — R002 Palpitations: Secondary | ICD-10-CM | POA: Diagnosis not present

## 2016-07-22 DIAGNOSIS — R5383 Other fatigue: Secondary | ICD-10-CM

## 2016-07-22 LAB — CBC WITH DIFFERENTIAL/PLATELET
Basophils Absolute: 0 10*3/uL (ref 0.0–0.1)
Basophils Relative: 0.6 % (ref 0.0–3.0)
EOS ABS: 0.1 10*3/uL (ref 0.0–0.7)
EOS PCT: 0.9 % (ref 0.0–5.0)
HEMATOCRIT: 35.3 % — AB (ref 36.0–46.0)
Hemoglobin: 12.1 g/dL (ref 12.0–15.0)
LYMPHS PCT: 18.1 % (ref 12.0–46.0)
Lymphs Abs: 1.6 10*3/uL (ref 0.7–4.0)
MCHC: 34.4 g/dL (ref 30.0–36.0)
MCV: 107.1 fl — AB (ref 78.0–100.0)
Monocytes Absolute: 0.6 10*3/uL (ref 0.1–1.0)
Monocytes Relative: 6.7 % (ref 3.0–12.0)
NEUTROS ABS: 6.5 10*3/uL (ref 1.4–7.7)
Neutrophils Relative %: 73.7 % (ref 43.0–77.0)
PLATELETS: 230 10*3/uL (ref 150.0–400.0)
RBC: 3.3 Mil/uL — ABNORMAL LOW (ref 3.87–5.11)
RDW: 15.7 % — AB (ref 11.5–15.5)
WBC: 8.8 10*3/uL (ref 4.0–10.5)

## 2016-07-22 LAB — URINALYSIS, ROUTINE W REFLEX MICROSCOPIC
Bilirubin Urine: NEGATIVE
HGB URINE DIPSTICK: NEGATIVE
Ketones, ur: NEGATIVE
LEUKOCYTES UA: NEGATIVE
NITRITE: NEGATIVE
RBC / HPF: NONE SEEN (ref 0–?)
Specific Gravity, Urine: <=1.005 — AB (ref 1.000–1.030)
Total Protein, Urine: NEGATIVE
URINE GLUCOSE: NEGATIVE
Urobilinogen, UA: 0.2 (ref 0.0–1.0)
WBC UA: NONE SEEN (ref 0–?)
pH: 6 (ref 5.0–8.0)

## 2016-07-22 LAB — COMPREHENSIVE METABOLIC PANEL
ALK PHOS: 138 U/L — AB (ref 39–117)
ALT: 45 U/L — ABNORMAL HIGH (ref 0–35)
AST: 77 U/L — ABNORMAL HIGH (ref 0–37)
Albumin: 5 g/dL (ref 3.5–5.2)
BUN: 6 mg/dL (ref 6–23)
CHLORIDE: 99 meq/L (ref 96–112)
CO2: 29 meq/L (ref 19–32)
Calcium: 9.8 mg/dL (ref 8.4–10.5)
Creatinine, Ser: 0.66 mg/dL (ref 0.40–1.20)
GFR: 100.73 mL/min (ref >60.00)
GLUCOSE: 107 mg/dL — AB (ref 70–99)
POTASSIUM: 4.6 meq/L (ref 3.5–5.1)
Sodium: 138 mEq/L (ref 135–145)
Total Bilirubin: 0.5 mg/dL (ref 0.2–1.2)
Total Protein: 7.4 g/dL (ref 6.0–8.3)

## 2016-07-22 LAB — TSH: TSH: 1.29 u[IU]/mL (ref 0.35–4.50)

## 2016-07-22 LAB — VITAMIN B12: Vitamin B-12: 382 pg/mL (ref 211–911)

## 2016-07-22 LAB — SEDIMENTATION RATE: Sed Rate: 4 mm/hr (ref 0–30)

## 2016-07-22 NOTE — Progress Notes (Signed)
Patient presents to clinic today to establish care.  Acute Concerns: Patient notes decreased appetite. Endorses some fatigue associated with this. Denies fever, chills, nausea, vomiting or abdominal pain. Denies melena, hematochezia or tenesmus. Endorses stools are regular but a loose consistency. Denies change to diet. Denies heart burn or indigestion.  Chronic Issues: Hypertension -- Patient is currently on HCTZ 25 mg daily. Endorses taking daily as directed. Patient denies chest pain, lightheadedness, dizziness, vision changes or frequent headaches. Endorses an episode of racing heart not long ago.  BP Readings from Last 3 Encounters:  07/22/16 (!) 142/98  04/23/15 (!) 142/94  09/11/14 121/87   Patient is followed by Dr. Andree Elk with [facility] for chronic neck pain.    Past Medical History:  Diagnosis Date  . Arthritis    neck  . Carpal tunnel syndrome on both sides 02/2012  . Contact lens/glasses fitting    wears contacts or glasses  . Dental crowns present    x 2 upper front  . Fluid retention   . History of MRSA infection   . Hypertension   . Migraines   . Wears partial dentures    lower    Past Surgical History:  Procedure Laterality Date  . CARPAL TUNNEL RELEASE  03/17/2012   Procedure: CARPAL TUNNEL RELEASE;  Surgeon: Roseanne Kaufman, MD;  Location: [place];  Service: Orthopedics;  Laterality: Bilateral;  left limited open carpal tunnel release. right carpal tunnel injection with 1cc of celestone and 2cc of marcaine.25%  . CARPAL TUNNEL RELEASE  06/30/2012   Procedure: CARPAL TUNNEL RELEASE;  Surgeon: Roseanne Kaufman, MD;  Location: [place];  Service: Orthopedics;  Laterality: Right;  RIGHT LIMITED OPEN CARPAL TUNNEL RELEASE  . CARPOMETACARPAL ([facility]) FUSION OF THUMB Left 06/23/2013   Procedure: LEFT CARPOMETACARPEL ([place]) FUSION OF THUMB WITH AUTOGRAFT FROM RADIUS AND REPAIR AS NECESSARY;  Surgeon: Roseanne Kaufman, MD;  Location: [place];  Service: Orthopedics;  Laterality: Left;  . HARDWARE REMOVAL Left 07/10/2013   Procedure: HARDWARE REMOVAL;  Surgeon: Roseanne Kaufman, MD;  [facility];  Service: Orthopedics;  Laterality: Left;  . INCISION AND DRAINAGE OF WOUND Left 07/10/2013   Procedure: IRRIGATION AND DEBRIDEMENT WOUND left wrist  ;  Surgeon: Roseanne Kaufman, MD;  [facility];  Service: Orthopedics;  Laterality: Left;  . LAPAROSCOPIC VAGINAL HYSTERECTOMY  02/24/2010  . LAPAROSCOPY  06/2010   for adhesions  . LEEP     x 2  . LUMBAR DISC SURGERY  03/23/2003   left L5-S1 discectomy with microdissection  . LUMBAR DISC SURGERY  05/23/2003   redo discectomy L5-S1 with microdissection    No current outpatient prescriptions on file prior to visit.   No current facility-administered medications on file prior to visit.     No Known Allergies  Family History  Problem Relation Age of Onset  . Diabetes Mother   . Hypertension Mother   . Lung cancer    . Colon cancer      neg. family history  . Colon polyps Sister   . Hypertension Sister   . Diabetes Sister     Social History   Social History  . Marital status: Married    Spouse name: N/A  . Number of children: N/A  . Years of education: N/A   Occupational History  . cleaning    Social History Main Topics  . Smoking status: Current Some Day Smoker    Packs/day: 0.50    Years:  10.00    Types: Cigarettes  . Smokeless tobacco: Never Used     Comment: quit smoking 1-2 yrs. ago  . Alcohol use No     Comment: stopped alcohol-2014-6/14  . Drug use: No  . Sexual activity: No   Other Topics Concern  . Not on file   Social History Narrative  . No narrative on file   Review of Systems  Constitutional: Positive for malaise/fatigue. Negative for fever and weight loss.  HENT: Negative for ear discharge, ear pain, hearing loss and tinnitus.   Eyes: Negative for blurred vision, double vision, photophobia and pain.  Respiratory: Negative  for cough and shortness of breath.   Cardiovascular: Positive for palpitations. Negative for chest pain.  Gastrointestinal: Negative for abdominal pain, blood in stool, constipation, diarrhea, heartburn, melena, nausea and vomiting.  Genitourinary: Negative for dysuria, flank pain, frequency, hematuria and urgency.  Musculoskeletal: Negative for falls.  Neurological: Negative for dizziness, loss of consciousness and headaches.  Endo/Heme/Allergies: Negative for environmental allergies.  Psychiatric/Behavioral: Negative for depression, hallucinations, substance abuse and suicidal ideas. The patient is not nervous/anxious and does not have insomnia.     BP (!) 142/98   Pulse 89   Temp 99 F (37.2 C) (Oral)   Resp 16   Ht 5\' 2"  (1.575 m)   Wt 130 lb (59 kg)   SpO2 99%   BMI 23.78 kg/m   Physical Exam  Constitutional: She is oriented to person, place, and time and well-developed, well-nourished, and in no distress.  HENT:  Head: Normocephalic and atraumatic.  Right Ear: Tympanic membrane, external ear and ear canal normal.  Left Ear: Tympanic membrane, external ear and ear canal normal.  Nose: Nose normal. No mucosal edema.  Mouth/Throat: Uvula is midline, oropharynx is clear and moist and mucous membranes are normal. No oropharyngeal exudate or posterior oropharyngeal erythema.  Eyes: Conjunctivae are normal. Pupils are equal, round, and reactive to light.  Neck: Neck supple. No thyromegaly present.  Cardiovascular: Normal rate, regular rhythm, normal heart sounds and intact distal pulses.   Pulmonary/Chest: Effort normal and breath sounds normal. No respiratory distress. She has no wheezes. She has no rales.  Abdominal: Soft. Bowel sounds are normal. She exhibits no distension and no mass. There is no tenderness. There is no rebound and no guarding.  Lymphadenopathy:    She has no cervical adenopathy.  Neurological: She is alert and oriented to person, place, and time. No cranial  nerve deficit.  Skin: Skin is warm and dry. No rash noted.  Psychiatric: Affect normal.  Vitals reviewed.  Assessment/Plan: Anemia + history of anemia. Will repeat CBC today.   Other fatigue Unclear. Patient with medical history significant for alcohol abuse, narcotic abuse (followed by pain management), fatigue, anxiety and depression. Will obtain lab panel today to further assess.   Palpitations Solitary episode in patient with history of hypertension. Giving her recent fatigue, EKG obtained revealing NSR at 81 bpm. No acute findings. If recurrent, will need Holter Study.     Leeanne Rio, PA-C

## 2016-07-22 NOTE — Patient Instructions (Signed)
Please go to the lab for blood work. I will call with your results.  Please continue your chronic medications as directed.  We will alter medications based on results.  If labs are normal we may consider a heart monitor, etc to further assess symptoms.  Follow-up with me in 1 week.  Start a multivitamin daily.  Also start a daily probiotic.   If you note any worsening symptoms or chest pain or SOB, please have someone take you to the ER.

## 2016-07-22 NOTE — Progress Notes (Signed)
Pre visit review using our clinic review tool, if applicable. No additional management support is needed unless otherwise documented below in the visit note. 

## 2016-07-26 LAB — VITAMIN D 1,25 DIHYDROXY
VITAMIN D 1, 25 (OH) TOTAL: 66 pg/mL (ref 18–72)
Vitamin D2 1, 25 (OH)2: <8 pg/mL
Vitamin D3 1, 25 (OH)2: 66 pg/mL

## 2016-07-27 ENCOUNTER — Telehealth: Payer: Self-pay | Admitting: Physician Assistant

## 2016-07-27 ENCOUNTER — Other Ambulatory Visit: Payer: Self-pay | Admitting: Physician Assistant

## 2016-07-27 DIAGNOSIS — R7989 Other specified abnormal findings of blood chemistry: Secondary | ICD-10-CM

## 2016-07-27 DIAGNOSIS — R945 Abnormal results of liver function studies: Principal | ICD-10-CM

## 2016-07-27 NOTE — Telephone Encounter (Signed)
Pt missed you phone call about lab results. Please called back when possible. Pt can be reached at [phone]. Thanks.

## 2016-07-27 NOTE — Telephone Encounter (Signed)
Spoke with patient about her lab results.

## 2016-07-29 ENCOUNTER — Ambulatory Visit: Payer: Self-pay | Admitting: Physician Assistant

## 2016-08-01 ENCOUNTER — Ambulatory Visit (HOSPITAL_BASED_OUTPATIENT_CLINIC_OR_DEPARTMENT_OTHER)
Admission: RE | Admit: 2016-08-01 | Discharge: 2016-08-01 | Disposition: A | Discharge status: Home / Self Care | Payer: BLUE CROSS/BLUE SHIELD | Source: Ambulatory Visit | Attending: Physician Assistant | Admitting: Physician Assistant

## 2016-08-01 DIAGNOSIS — R945 Abnormal results of liver function studies: Secondary | ICD-10-CM

## 2016-08-01 DIAGNOSIS — R7989 Other specified abnormal findings of blood chemistry: Secondary | ICD-10-CM

## 2016-08-03 ENCOUNTER — Other Ambulatory Visit: Payer: Self-pay | Admitting: Physician Assistant

## 2016-08-03 DIAGNOSIS — R7989 Other specified abnormal findings of blood chemistry: Secondary | ICD-10-CM

## 2016-08-03 DIAGNOSIS — R945 Abnormal results of liver function studies: Principal | ICD-10-CM

## 2016-08-07 ENCOUNTER — Other Ambulatory Visit: Payer: Self-pay

## 2016-08-23 DIAGNOSIS — R002 Palpitations: Secondary | ICD-10-CM | POA: Insufficient documentation

## 2016-08-23 DIAGNOSIS — R5383 Other fatigue: Secondary | ICD-10-CM | POA: Insufficient documentation

## 2016-08-23 NOTE — Assessment & Plan Note (Signed)
Solitary episode in patient with history of hypertension. Giving her recent fatigue, EKG obtained revealing NSR at 81 bpm. No acute findings. If recurrent, will need Holter Study.

## 2016-08-23 NOTE — Assessment & Plan Note (Signed)
Unclear. Patient with medical history significant for alcohol abuse, narcotic abuse (followed by pain management), fatigue, anxiety and depression. Will obtain lab panel today to further assess.

## 2016-08-23 NOTE — Assessment & Plan Note (Signed)
+   history of anemia. Will repeat CBC today.

## 2016-10-01 ENCOUNTER — Encounter: Payer: Self-pay | Admitting: Physician Assistant

## 2016-11-02 ENCOUNTER — Other Ambulatory Visit (INDEPENDENT_AMBULATORY_CARE_PROVIDER_SITE_OTHER): Payer: BLUE CROSS/BLUE SHIELD

## 2016-11-02 DIAGNOSIS — R7989 Other specified abnormal findings of blood chemistry: Secondary | ICD-10-CM

## 2016-11-02 DIAGNOSIS — R945 Abnormal results of liver function studies: Principal | ICD-10-CM

## 2016-11-02 LAB — HEPATIC FUNCTION PANEL
ALBUMIN: 4.2 g/dL (ref 3.5–5.2)
ALK PHOS: 146 U/L — AB (ref 39–117)
ALT: 37 U/L — AB (ref 0–35)
AST: 97 U/L — AB (ref 0–37)
Bilirubin, Direct: 0.1 mg/dL (ref 0.0–0.3)
Total Bilirubin: 0.4 mg/dL (ref 0.2–1.2)
Total Protein: 6.7 g/dL (ref 6.0–8.3)

## 2016-11-09 ENCOUNTER — Encounter: Payer: Self-pay | Admitting: Physician Assistant

## 2016-11-09 ENCOUNTER — Ambulatory Visit (INDEPENDENT_AMBULATORY_CARE_PROVIDER_SITE_OTHER): Payer: BLUE CROSS/BLUE SHIELD | Admitting: Physician Assistant

## 2016-11-09 VITALS — BP 130/98 | HR 102 | Temp 98.8°F | Resp 14 | Ht 62.0 in | Wt 127.0 lb

## 2016-11-09 DIAGNOSIS — F1010 Alcohol abuse, uncomplicated: Secondary | ICD-10-CM

## 2016-11-09 DIAGNOSIS — M4722 Other spondylosis with radiculopathy, cervical region: Secondary | ICD-10-CM | POA: Diagnosis not present

## 2016-11-09 DIAGNOSIS — R7989 Other specified abnormal findings of blood chemistry: Secondary | ICD-10-CM | POA: Diagnosis not present

## 2016-11-09 DIAGNOSIS — F17210 Nicotine dependence, cigarettes, uncomplicated: Secondary | ICD-10-CM

## 2016-11-09 DIAGNOSIS — R945 Abnormal results of liver function studies: Secondary | ICD-10-CM

## 2016-11-09 MED ORDER — NICOTINE 14 MG/24HR TD PT24: 14.0000 mg | MEDICATED_PATCH | Freq: Every day | TRANSDERMAL | 0 refills | Status: DC

## 2016-11-09 MED ORDER — SUVOREXANT 10 MG PO TABS
10.0000 mg | ORAL_TABLET | Freq: Every day | ORAL | 0 refills | Status: DC
Start: 2016-11-09 — End: 2016-11-23

## 2016-11-09 NOTE — Patient Instructions (Signed)
Please go to the lab for blood work. I will call you with your results. Please start a daily probiotic as discussed at last visit. Speak with your pain specialist about alternatives to the Meloxicam and tylenol-containing hydrocodone product you are currently on.   Please work on cutting out the nightly drinks so we can see what impact it is having on liver function.   Make sure to follow-up with Dr. Carlean Purl as scheduled.  You will be contacted regarding assessment by Physical Therapy. Start the nicoderm as directed. Use the Belsomra as directed for sleep.  Follow-up in 3-4 weeks.

## 2016-11-09 NOTE — Progress Notes (Signed)
Pre visit review using our clinic review tool, if applicable. No additional management support is needed unless otherwise documented below in the visit note. 

## 2016-11-09 NOTE — Progress Notes (Signed)
Patient presents to clinic today to follow-up of elevated liver enzymes. Patient with recent panel revealing ALk phos at 138, AST at 77 and ALT at 45. [name] Patient has had US Abdomen that was negative. Has been set up with Gastroenterology previously for chronic abdominal pain, nausea and elevated lfts. Has not scheduled with GI yet. Patient does drink 2 alcoholic drinks per night. Is currently on Norco from pain management, taking QID.   Patient is also a current smoker. Is ready to quit. Has tried Chantix previously without success. Would like to discuss other options.   Past Medical History:  Diagnosis Date  . Arthritis    neck  . Carpal tunnel syndrome on both sides 02/2012  . Contact lens/glasses fitting    wears contacts or glasses  . Dental crowns present    x 2 upper front  . Fluid retention   . History of MRSA infection   . Hypertension   . Migraines   . Wears partial dentures    lower    Current Outpatient Prescriptions on File Prior to Visit  Medication Sig Dispense Refill  . FLUoxetine (PROZAC) 20 MG capsule TAKE ONE CAPSULE BY MOUTH TWICE DAILY    . gabapentin (NEURONTIN) 600 MG tablet TK 1 T PO TID PRN  2  . hydrochlorothiazide (HYDRODIURIL) 25 MG tablet TAKE 1 TABLET(25 MG) BY MOUTH DAILY    . HYDROcodone-acetaminophen (NORCO) 10-325 MG tablet TK 1 T PO QID PRN    . loratadine (CLARITIN) 10 MG tablet TAKE 1 TABLET BY MOUTH EVERY DAY    . omeprazole (PRILOSEC) 20 MG capsule TAKE 1 CAPSULE BY MOUTH TWICE DAILY     No current facility-administered medications on file prior to visit.     No Known Allergies  Family History  Problem Relation Age of Onset  . Diabetes Mother   . Hypertension Mother   . Lung cancer    . Colon cancer      neg. family history  . Colon polyps Sister   . Hypertension Sister   . Diabetes Sister     Social History   Social History  . Marital status: Married    Spouse name: N/A  . Number of children: N/A  . Years of education: N/A     Occupational History  . cleaning    Social History Main Topics  . Smoking status: Current Some Day Smoker    Packs/day: 0.50    Years: 10.00    Types: Cigarettes  . Smokeless tobacco: Never Used     Comment: quit smoking 1-2 yrs. ago  . Alcohol use Yes     Comment: 2 Mikes hard drinks  . Drug use: No  . Sexual activity: No   Other Topics Concern  . None   Social History Narrative  . None   Review of Systems - See HPI.  All other ROS are negative.  BP (!) 130/98   Pulse (!) 102   Temp 98.8 F (37.1 C) (Oral)   Resp 14   Ht _0  (1.575 m)   Wt 127 lb (57.6 kg)   SpO2 99%   BMI 23.23 kg/m   Physical Exam  Constitutional: She is oriented to person, place, and time and well-developed, well-nourished, and in no distress.  HENT:  Head: Normocephalic and atraumatic.  Eyes: Conjunctivae are normal.  Neck: Neck supple.  Cardiovascular: Normal rate, regular rhythm, normal heart sounds and intact distal pulses.   Pulmonary/Chest: Effort normal and breath sounds  normal. No respiratory distress. She has no wheezes. She has no rales. She exhibits no tenderness.  Abdominal: Soft. Bowel sounds are normal. She exhibits no distension. There is no tenderness.  Lymphadenopathy:    She has no cervical adenopathy.  Neurological: She is alert and oriented to person, place, and time. No cranial nerve deficit.  Skin: Skin is warm and dry. No rash noted.  Psychiatric: Affect normal.  Vitals reviewed.   Recent Results (from the past 2160 hour(s))  Hepatic function panel     Status: Abnormal   Collection Time: 11/02/16 11:25 AM  Result Value Ref Range   Total Bilirubin 0.4 0.2 - 1.2 mg/dL   Bilirubin, Direct 0.1 0.0 - 0.3 mg/dL   Alkaline Phosphatase 146 (H) 39 - 117 U/L   AST 97 (H) 0 - 37 U/L   ALT 37 (H) 0 - 35 U/L   Total Protein 6.7 6.0 - 8.3 g/dL   Albumin 4.2 3.5 - 5.2 g/dL  Acute Hep Panel & Hep B Surface Ab     Status: None   Collection Time: 11/09/16  2:07 PM  Result  Value Ref Range   Hepatitis B Surface Ag NEGATIVE NEGATIVE   Hep B C IgM NON REACTIVE NON REACTIVE    Comment: High levels of Hepatitis B Core IgM antibody are detectable during the acute stage of Hepatitis B. This antibody is used to differentiate current from past HBV infection.      Hep B S Ab NEG NEGATIVE   Hep A IgM NON REACTIVE NON REACTIVE   HCV Ab NEGATIVE NEGATIVE    Assessment/Plan: Nicotine dependence Discussed options. Will start trial of Nicoderm. Follow-up scheduled.  Alcohol abuse Likely contributing to LFT elevation. Discussed starting to half nightly alcohol consumption. Will work on this. FU scheduled.  Elevated LFTs Repeat labs today. Discussed reduction of alcohol intake. Referral to GI has been placed. Patient to call and schedule appointment with Dr. Carlean Purl.    Leeanne Rio, PA-C

## 2016-11-10 ENCOUNTER — Telehealth: Payer: Self-pay | Admitting: *Deleted

## 2016-11-10 LAB — ACUTE HEP PANEL AND HEP B SURFACE AB
HCV Ab: NEGATIVE
Hep A IgM: NONREACTIVE
Hep B C IgM: NONREACTIVE
Hep B S Ab: NEGATIVE
Hepatitis B Surface Ag: NEGATIVE

## 2016-11-10 NOTE — Telephone Encounter (Signed)
PA for Belsomra completed through [facility] [employer] and approved  11/10/16-07/19/2038  Key: [place] notified of approval

## 2016-11-11 ENCOUNTER — Other Ambulatory Visit: Payer: Self-pay | Admitting: Physician Assistant

## 2016-11-11 DIAGNOSIS — R945 Abnormal results of liver function studies: Principal | ICD-10-CM

## 2016-11-11 DIAGNOSIS — R7989 Other specified abnormal findings of blood chemistry: Secondary | ICD-10-CM

## 2016-11-16 DIAGNOSIS — K7460 Unspecified cirrhosis of liver: Secondary | ICD-10-CM | POA: Insufficient documentation

## 2016-11-16 NOTE — Assessment & Plan Note (Signed)
Discussed options. Will start trial of Nicoderm. Follow-up scheduled.

## 2016-11-16 NOTE — Assessment & Plan Note (Signed)
Likely contributing to LFT elevation. Discussed starting to half nightly alcohol consumption. Will work on this. FU scheduled.

## 2016-11-16 NOTE — Assessment & Plan Note (Signed)
Repeat labs today. Discussed reduction of alcohol intake. Referral to GI has been placed. Patient to call and schedule appointment with Dr. Carlean Purl.

## 2016-11-18 ENCOUNTER — Encounter: Payer: Self-pay | Admitting: Physician Assistant

## 2016-11-18 ENCOUNTER — Other Ambulatory Visit (INDEPENDENT_AMBULATORY_CARE_PROVIDER_SITE_OTHER): Payer: BLUE CROSS/BLUE SHIELD

## 2016-11-18 ENCOUNTER — Ambulatory Visit (INDEPENDENT_AMBULATORY_CARE_PROVIDER_SITE_OTHER): Payer: BLUE CROSS/BLUE SHIELD | Admitting: Physician Assistant

## 2016-11-18 VITALS — BP 100/68 | HR 84 | Ht 62.0 in | Wt 126.0 lb

## 2016-11-18 DIAGNOSIS — R945 Abnormal results of liver function studies: Principal | ICD-10-CM

## 2016-11-18 DIAGNOSIS — R7989 Other specified abnormal findings of blood chemistry: Secondary | ICD-10-CM

## 2016-11-18 LAB — CBC WITH DIFFERENTIAL/PLATELET
BASOS PCT: 0.4 % (ref 0.0–3.0)
Basophils Absolute: 0 10*3/uL (ref 0.0–0.1)
EOS PCT: 0.8 % (ref 0.0–5.0)
Eosinophils Absolute: 0.1 10*3/uL (ref 0.0–0.7)
HCT: 35.3 % — ABNORMAL LOW (ref 36.0–46.0)
Hemoglobin: 12.1 g/dL (ref 12.0–15.0)
LYMPHS ABS: 1.2 10*3/uL (ref 0.7–4.0)
Lymphocytes Relative: 12.1 % (ref 12.0–46.0)
MCHC: 34.2 g/dL (ref 30.0–36.0)
MCV: 109.3 fl — ABNORMAL HIGH (ref 78.0–100.0)
MONO ABS: 1.2 10*3/uL — AB (ref 0.1–1.0)
Monocytes Relative: 11.8 % (ref 3.0–12.0)
NEUTROS ABS: 7.5 10*3/uL (ref 1.4–7.7)
NEUTROS PCT: 74.9 % (ref 43.0–77.0)
PLATELETS: 171 10*3/uL (ref 150.0–400.0)
RBC: 3.23 Mil/uL — AB (ref 3.87–5.11)
RDW: 16.1 % — AB (ref 11.5–15.5)
WBC: 10.1 10*3/uL (ref 4.0–10.5)

## 2016-11-18 LAB — PROTIME-INR
INR: 1.2 ratio — ABNORMAL HIGH (ref 0.8–1.0)
Prothrombin Time: 12.8 s (ref 9.6–13.1)

## 2016-11-18 LAB — COMPREHENSIVE METABOLIC PANEL
ALT: 60 U/L — ABNORMAL HIGH (ref 0–35)
AST: 145 U/L — ABNORMAL HIGH (ref 0–37)
Albumin: 3.7 g/dL (ref 3.5–5.2)
Alkaline Phosphatase: 147 U/L — ABNORMAL HIGH (ref 39–117)
BUN: 4 mg/dL — AB (ref 6–23)
CHLORIDE: 96 meq/L (ref 96–112)
CO2: 35 meq/L — AB (ref 19–32)
Calcium: 9.7 mg/dL (ref 8.4–10.5)
Creatinine, Ser: 0.57 mg/dL (ref 0.40–1.20)
GFR: 119.14 mL/min (ref >60.00)
GLUCOSE: 107 mg/dL — AB (ref 70–99)
POTASSIUM: 5 meq/L (ref 3.5–5.1)
SODIUM: 138 meq/L (ref 135–145)
Total Bilirubin: 0.5 mg/dL (ref 0.2–1.2)
Total Protein: 7 g/dL (ref 6.0–8.3)

## 2016-11-18 NOTE — Patient Instructions (Signed)
Please go to the basement level to have your labs drawn.  Drink no Alcohol.  Make a follow up appointment with Dr. Carlean Purl when the nurse calls with results.   You have been scheduled for a CT scan of the abdomen and pelvis at [facility] ([address]---this is in the same building as [facility]).   You are scheduled on 11-27-2016 at 8:30 am. You should arrive 15 minutes prior to your appointment time for registration. Please follow the written instructions below on the day of your exam:  WARNING: IF YOU ARE ALLERGIC TO IODINE/X-RAY DYE, PLEASE NOTIFY RADIOLOGY IMMEDIATELY AT [phone]! YOU WILL BE GIVEN A 13 HOUR PREMEDICATION PREP.  1) Do not eat or drink anything after 4:30 am(4 hours prior to your test) 2) You have been given 2 bottles of oral contrast to drink. The solution may taste               better if refrigerated, but do NOT add ice or any other liquid to this solution. Shake             well before drinking.    Drink 1 bottle of contrast @ 6:30 am (2 hours prior to your exam)  Drink 1 bottle of contrast @ 7:30 am (1 hour prior to your exam)  You may take any medications as prescribed with a small amount of water except for the following: Metformin, Glucophage, Glucovance, Avandamet, Riomet, Fortamet, Actoplus Met, Janumet, Glumetza or Metaglip. The above medications must be held the day of the exam AND 48 hours after the exam.  The purpose of you drinking the oral contrast is to aid in the visualization of your intestinal tract. The contrast solution may cause some diarrhea. Before your exam is started, you will be given a small amount of fluid to drink. Depending on your individual set of symptoms, you may also receive an intravenous injection of x-ray contrast/dye. Plan on being at [facility] for 30 minutes or long, depending on the type of exam you are having performed.  If you have any questions regarding your exam or if you need to reschedule,  you may call the CT department at [phone] between the hours of 8:00 am and 5:00 pm, Monday-Friday.  ________________________________________________________________________

## 2016-11-18 NOTE — Progress Notes (Addendum)
Subjective:    Patient ID: Amy Villa, female    DOB: 1966/07/10, 51 y.o.   MRN: [id number]  HPI Amy Villa is a pleasant 51 year old white female, known to Dr. Carlean Purl from colonoscopy done in 2014. She is referred today by Leeanne Rio PA-C for evaluation of elevated liver function studies. Patient does have history of previous EtOH abuse with alcohol-induced hepatitis and pancreatitis, she is narcotic dependent and followed by a pain clinic for chronic back pain. Also with hypertension, anxiety. Colonoscopy in March 2014 pertinent for 2 tiny polyps in the rectum which were removed and found to be benign polypoid mucosa says she was indicated for 10 year interval follow-up. Patient states that she knows that she has had elevated liver test in the past. In January 2018 alkaline phosphatase was 138 AST 77, ALT of 45, hemoglobin 12.1 and MCV of 107. Repeat in April 2018 total bili 0.4 alkaline phosphatase 146 AST of 97 and ALT of 37. Acute hepatitis A, B, and C serologies all negative. Patient admits to heavy daily alcohol use for several years in the past. She says over the past couple of years she has not been drinking on a daily basis but usually drinks Thursday through Sunday and will have a couple of Mikes hard ales per day. She states that her husband drinks on a daily basis as well. She has no complaints of abdominal discomfort. She has had some fluctuation in her appetite over the past few months but says she's been doing better over the past few weeks and eating very well has no complaints of nausea vomiting no fever or chills and no issues with changes in her bowels. She had been having a lot of difficulty sleeping, was recently started on Belsomra  and says that she is trying not to drink any alcohol. Ultrasound done in January 2018 no gallstones, common bile duct of 4 mm liver read as normal.  Review of Systems Pertinent positive and negative review of systems were noted in the  above HPI section.  All other review of systems was otherwise negative.  Outpatient Encounter Prescriptions as of 11/18/2016  Medication Sig  . fluocinonide (LIDEX) 0.05 % external solution APPLY ON THE SKIN BID PRN  . FLUoxetine (PROZAC) 20 MG capsule TAKE ONE CAPSULE BY MOUTH TWICE DAILY  . gabapentin (NEURONTIN) 600 MG tablet TK 1 T PO TID PRN  . hydrochlorothiazide (HYDRODIURIL) 25 MG tablet TAKE 1 TABLET(25 MG) BY MOUTH DAILY  . HYDROcodone-acetaminophen (NORCO) 10-325 MG tablet TK 1 T PO QID PRN  . loratadine (CLARITIN) 10 MG tablet TAKE 1 TABLET BY MOUTH EVERY DAY  . nicotine (NICODERM CQ) 14 mg/24hr patch Place 1 patch (14 mg total) onto the skin daily.  [name] omeprazole (PRILOSEC) 20 MG capsule TAKE 1 CAPSULE BY MOUTH TWICE DAILY  . Suvorexant (BELSOMRA) 10 MG TABS Take 10 mg by mouth at bedtime.   No facility-administered encounter medications on file as of 11/18/2016.    No Known Allergies Patient Active Problem List   Diagnosis Date Noted  . Elevated LFTs 11/16/2016  . Palpitations 08/23/2016  . Other fatigue 08/23/2016  . Nicotine dependence 07/11/2013  . DJD (degenerative joint disease) 07/11/2013  . DDD (degenerative disc disease) 07/11/2013  . Anemia 07/11/2013  . Narcotic abuse 07/11/2013  . CMC arthritis, thumb, degenerative 06/23/2013  . Depressive disorder, not elsewhere classified 02/13/2013  . Anxiety state 09/07/2012  . Alcohol abuse 09/02/2010  . Acute alcoholic hepatitis 19/37/9024  . PANCREATITIS  09/02/2010  . Benign essential hypertension 06/09/2005   Social History   Social History  . Marital status: Married    Spouse name: Amy Villa  . Number of children: Amy Villa  . Years of education: Amy Villa   Occupational History  . cleaning    Social History Main Topics  . Smoking status: Former Smoker    Packs/day: 0.50    Years: 10.00    Types: Cigarettes  . Smokeless tobacco: Never Used     Comment: now has patch 11/18/16  . Alcohol use Yes     Comment: 2 Mikes hard  drinks; now has only occasional as of 11/18/16   . Drug use: No  . Sexual activity: No   Other Topics Concern  . Not on file   Social History Narrative  . No narrative on file    Ms. Vieau's family history includes Colon polyps in her sister; Diabetes in her mother and sister; Hypertension in her mother and sister; Lung cancer in her maternal grandmother.      Objective:    Vitals:   11/18/16 1037  BP: 100/68  Pulse: 84    Physical Exam  well-developed white female in no acute distress, blood pressure 100/68 pulse 84, height 5 foot 2, weight 126, BMI of 23.0. HEENT; nontraumatic normocephalic EOMI PERRLA sclera anicteric, Cardiovascular; regular rate and rhythm with S1-S2 no murmur or gallop, Pulmonary ;clear bilaterally, Abdomen; protuberant  soft, bowel sounds are present there is no appreciable fluid wave, liver palpable down 2 fingerbreadths below the right costal margin somewhat nodular nontender, no palpable splenomegaly, Rectal; exam not done, Extremities; no clubbing cyanosis or edema she does have scattered telangiectasia, Neuropsych; mood and affect appropriate       Assessment & Plan:   #65 51 year old white female referred for elevated LFTs which have been persistent. Hepatitis serologies are negative.  Patient does have previous history of heavy daily alcohol use, and prior history of alcoholic hepatitis. I suspect that current LFT elevation is also secondary to mild alcohol-induced hepatitis. Patient says she has cut way back and has not been drinking any alcohol over the past couple of weeks. #2 narcotic dependence #3 chronic back pain/degenerative disc disease #4 anxiety #5 hypertension #6 colon cancer surveillance-up-to-date with colonoscopy done in March 2014 2 tiny polyps which were benign polypoid mucosa- indicated for 10 year interval follow-up.  Plan; Schedule for CT of the abdomen and pelvis-rule out cirrhosis/hepatic lesion Repeat hepatic panel CBC with  differential, ProTime/INR Long discussion with patient regarding importance of complete alcohol abstinence Plan follow-up with Dr. Carlean Purl in one month.   Amy Genia Harold PA-C 11/18/2016   Cc: [name], PA-C   Agree with Ms. Genia Harold assessment and plan. [name], MD, [facility]

## 2016-11-23 ENCOUNTER — Other Ambulatory Visit: Payer: Self-pay | Admitting: Physician Assistant

## 2016-11-23 NOTE — Telephone Encounter (Signed)
Spoke with patient and scheduled a follow up appt on 11/25/16 to follow up on sleep. Patient denies any alcohol use since starting the Belsomra. Can this be refilled in PCP absence

## 2016-11-23 NOTE — Telephone Encounter (Signed)
LMOVM advising patient she is due for a follow up on insomnia, alcohol abuse.

## 2016-11-23 NOTE — Telephone Encounter (Signed)
LMOVM advising patient the rx was faxed to the pharmacy.

## 2016-11-25 ENCOUNTER — Ambulatory Visit: Payer: Self-pay | Admitting: Physician Assistant

## 2016-11-27 ENCOUNTER — Ambulatory Visit (INDEPENDENT_AMBULATORY_CARE_PROVIDER_SITE_OTHER)
Admission: RE | Admit: 2016-11-27 | Discharge: 2016-11-27 | Disposition: A | Discharge status: Home / Self Care | Payer: BLUE CROSS/BLUE SHIELD | Source: Ambulatory Visit | Attending: Physician Assistant | Admitting: Physician Assistant

## 2016-11-27 ENCOUNTER — Other Ambulatory Visit: Payer: Self-pay | Admitting: Physician Assistant

## 2016-11-27 DIAGNOSIS — R945 Abnormal results of liver function studies: Principal | ICD-10-CM

## 2016-11-27 DIAGNOSIS — R7989 Other specified abnormal findings of blood chemistry: Secondary | ICD-10-CM

## 2016-11-27 MED ORDER — IOPAMIDOL (ISOVUE-300) INJECTION 61%
100.0000 mL | Freq: Once | INTRAVENOUS | Status: AC | PRN
  Administered 2016-11-27: 100 mL via INTRAVENOUS

## 2016-11-30 ENCOUNTER — Encounter: Payer: Self-pay | Admitting: Physician Assistant

## 2016-11-30 ENCOUNTER — Other Ambulatory Visit: Payer: Self-pay

## 2016-11-30 ENCOUNTER — Ambulatory Visit (INDEPENDENT_AMBULATORY_CARE_PROVIDER_SITE_OTHER): Payer: BLUE CROSS/BLUE SHIELD | Admitting: Physician Assistant

## 2016-11-30 VITALS — BP 102/70 | HR 99 | Temp 98.6°F | Resp 14 | Ht 62.0 in | Wt 125.0 lb

## 2016-11-30 DIAGNOSIS — F329 Major depressive disorder, single episode, unspecified: Secondary | ICD-10-CM | POA: Diagnosis not present

## 2016-11-30 DIAGNOSIS — G4700 Insomnia, unspecified: Secondary | ICD-10-CM

## 2016-11-30 DIAGNOSIS — K760 Fatty (change of) liver, not elsewhere classified: Secondary | ICD-10-CM | POA: Diagnosis not present

## 2016-11-30 DIAGNOSIS — I700 Atherosclerosis of aorta: Secondary | ICD-10-CM | POA: Diagnosis not present

## 2016-11-30 DIAGNOSIS — F419 Anxiety disorder, unspecified: Secondary | ICD-10-CM | POA: Diagnosis not present

## 2016-11-30 DIAGNOSIS — R238 Other skin changes: Secondary | ICD-10-CM | POA: Diagnosis not present

## 2016-11-30 DIAGNOSIS — K7460 Unspecified cirrhosis of liver: Secondary | ICD-10-CM

## 2016-11-30 DIAGNOSIS — T07XXXA Unspecified multiple injuries, initial encounter: Secondary | ICD-10-CM | POA: Diagnosis not present

## 2016-11-30 DIAGNOSIS — R233 Spontaneous ecchymoses: Secondary | ICD-10-CM

## 2016-11-30 DIAGNOSIS — F32A Depression, unspecified: Secondary | ICD-10-CM

## 2016-11-30 LAB — COMPREHENSIVE METABOLIC PANEL
ALT: 88 U/L — AB (ref 0–35)
AST: 170 U/L — AB (ref 0–37)
Albumin: 4 g/dL (ref 3.5–5.2)
Alkaline Phosphatase: 193 U/L — ABNORMAL HIGH (ref 39–117)
BILIRUBIN TOTAL: 0.5 mg/dL (ref 0.2–1.2)
BUN: 5 mg/dL — AB (ref 6–23)
CALCIUM: 9.2 mg/dL (ref 8.4–10.5)
CO2: 32 meq/L (ref 19–32)
CREATININE: 0.54 mg/dL (ref 0.40–1.20)
Chloride: 90 mEq/L — ABNORMAL LOW (ref 96–112)
GFR: 126.79 mL/min (ref >60.00)
GLUCOSE: 74 mg/dL (ref 70–99)
Potassium: 3.3 mEq/L — ABNORMAL LOW (ref 3.5–5.1)
Sodium: 131 mEq/L — ABNORMAL LOW (ref 135–145)
Total Protein: 7.1 g/dL (ref 6.0–8.3)

## 2016-11-30 LAB — LIPID PANEL
CHOL/HDL RATIO: 8
Cholesterol: 179 mg/dL (ref 0–200)
HDL: 21.7 mg/dL — ABNORMAL LOW (ref >39.00)
Triglycerides: 443 mg/dL — ABNORMAL HIGH (ref 0.0–149.0)

## 2016-11-30 LAB — LDL CHOLESTEROL, DIRECT: LDL DIRECT: 106 mg/dL

## 2016-11-30 LAB — APTT: aPTT: 31.2 s (ref 23.4–32.7)

## 2016-11-30 LAB — HEMOGLOBIN A1C: Hgb A1c MFr Bld: 6 % (ref 4.6–6.5)

## 2016-11-30 MED ORDER — BUSPIRONE HCL 7.5 MG PO TABS: 7.5000 mg | ORAL_TABLET | Freq: Two times a day (BID) | ORAL | 1 refills | Status: DC

## 2016-11-30 NOTE — Patient Instructions (Addendum)
Please go to the lab for blood work. I will call with your results. Please follow-up with Dr. Carlean Purl as scheduled next week.  Please continue the Belsomra and Prozac. Start the BuSpar as directed for anxiety.  I am setting you up with Neurology for further assessment of these nighttime tics.  You are going to receive a call to get your bone density test to check for osteoporosis giving healing fracture on CT in the absence of trauma.   We will know more when I get your lab results.  Please keep working on diet and exercise.  Follow-up next Friday.
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
## 2016-11-30 NOTE — Progress Notes (Signed)
Pre visit review using our clinic review tool, if applicable. No additional management support is needed unless otherwise documented below in the visit note. 

## 2016-12-01 DIAGNOSIS — I700 Atherosclerosis of aorta: Secondary | ICD-10-CM | POA: Insufficient documentation

## 2016-12-01 DIAGNOSIS — R238 Other skin changes: Secondary | ICD-10-CM | POA: Insufficient documentation

## 2016-12-01 DIAGNOSIS — F32A Depression, unspecified: Secondary | ICD-10-CM | POA: Insufficient documentation

## 2016-12-01 DIAGNOSIS — G4700 Insomnia, unspecified: Secondary | ICD-10-CM | POA: Insufficient documentation

## 2016-12-01 DIAGNOSIS — F419 Anxiety disorder, unspecified: Secondary | ICD-10-CM

## 2016-12-01 DIAGNOSIS — K760 Fatty (change of) liver, not elsewhere classified: Secondary | ICD-10-CM | POA: Insufficient documentation

## 2016-12-01 DIAGNOSIS — F329 Major depressive disorder, single episode, unspecified: Secondary | ICD-10-CM | POA: Insufficient documentation

## 2016-12-01 DIAGNOSIS — R233 Spontaneous ecchymoses: Secondary | ICD-10-CM | POA: Insufficient documentation

## 2016-12-01 DIAGNOSIS — T07XXXA Unspecified multiple injuries, initial encounter: Secondary | ICD-10-CM | POA: Insufficient documentation

## 2016-12-01 LAB — PTH, INTACT AND CALCIUM
CALCIUM: 9.1 mg/dL (ref 8.6–10.4)
PTH: 61 pg/mL (ref 14–64)

## 2016-12-01 NOTE — Assessment & Plan Note (Signed)
Noted on CT scan. Also with changes consistent with early cirrhosis. Will check lipids today. Dietary and lifestyle changes discussed with patient. Followed by GI with follow-up scheduled next week. Lipids today.

## 2016-12-01 NOTE — Assessment & Plan Note (Signed)
Much improved with Belsomra. Will continue the same.

## 2016-12-01 NOTE — Assessment & Plan Note (Signed)
Noted by patient. Area of purpura noted on leg. PT checked last week and normal. Will check PTT today. May have a coagulopathy related to liver issues. May need hematology input.

## 2016-12-01 NOTE — Assessment & Plan Note (Signed)
Noted on CT scan. Patient managed by Gastroenterology. Has follow-up scheduled next week with Dr. Carlean Purl. Again discussed diet and lifestyle changes. Cessation of alcohol.

## 2016-12-01 NOTE — Assessment & Plan Note (Signed)
Depression helped with Fluoxetine. Anxiety is main issue at present. Will add on BuSpar at low dose BID. Follow-up scheduled.

## 2016-12-01 NOTE — Assessment & Plan Note (Signed)
Noted on CT scan. Working on smoking cessation. Working on alcohol cessation. Healthy diet and exercise reviewed. Will check lipids today.
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
## 2016-12-03 ENCOUNTER — Other Ambulatory Visit: Payer: Self-pay | Admitting: Physician Assistant

## 2016-12-03 DIAGNOSIS — E781 Pure hyperglyceridemia: Secondary | ICD-10-CM

## 2016-12-03 DIAGNOSIS — E871 Hypo-osmolality and hyponatremia: Secondary | ICD-10-CM

## 2016-12-03 MED ORDER — FISH OIL 1000 MG PO CAPS: 1.0000 | ORAL_CAPSULE | Freq: Every day | ORAL | 0 refills | Status: DC

## 2016-12-07 ENCOUNTER — Other Ambulatory Visit: Payer: Self-pay

## 2016-12-07 ENCOUNTER — Ambulatory Visit: Payer: BLUE CROSS/BLUE SHIELD

## 2016-12-09 ENCOUNTER — Encounter: Payer: Self-pay | Admitting: Internal Medicine

## 2016-12-09 ENCOUNTER — Ambulatory Visit (INDEPENDENT_AMBULATORY_CARE_PROVIDER_SITE_OTHER): Payer: BLUE CROSS/BLUE SHIELD | Admitting: Internal Medicine

## 2016-12-09 ENCOUNTER — Ambulatory Visit: Payer: Self-pay | Admitting: Internal Medicine

## 2016-12-09 VITALS — BP 108/64 | HR 76 | Ht 62.0 in | Wt 127.0 lb

## 2016-12-09 DIAGNOSIS — K7010 Alcoholic hepatitis without ascites: Secondary | ICD-10-CM

## 2016-12-09 DIAGNOSIS — K700 Alcoholic fatty liver: Secondary | ICD-10-CM

## 2016-12-09 NOTE — Patient Instructions (Signed)
   Please keep your June 21st appointment at 10:45AM with Dr Carlean Purl.   Please come and get lab work done 1-2 days before your appointment.    I appreciate the opportunity to care for you. Silvano Rusk, MD, [facility]

## 2016-12-11 ENCOUNTER — Ambulatory Visit: Payer: Self-pay | Admitting: Physician Assistant
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
## 2016-12-16 ENCOUNTER — Encounter: Payer: Self-pay | Admitting: Internal Medicine

## 2016-12-16 ENCOUNTER — Encounter: Payer: Self-pay | Admitting: Physician Assistant

## 2016-12-16 ENCOUNTER — Ambulatory Visit (INDEPENDENT_AMBULATORY_CARE_PROVIDER_SITE_OTHER): Payer: BLUE CROSS/BLUE SHIELD | Admitting: Physician Assistant

## 2016-12-16 VITALS — BP 120/84 | HR 100 | Temp 98.1°F | Resp 14 | Ht 62.0 in | Wt 125.0 lb

## 2016-12-16 DIAGNOSIS — G4700 Insomnia, unspecified: Secondary | ICD-10-CM

## 2016-12-16 DIAGNOSIS — K7030 Alcoholic cirrhosis of liver without ascites: Secondary | ICD-10-CM | POA: Diagnosis not present

## 2016-12-16 DIAGNOSIS — F419 Anxiety disorder, unspecified: Secondary | ICD-10-CM

## 2016-12-16 DIAGNOSIS — F32A Depression, unspecified: Secondary | ICD-10-CM

## 2016-12-16 DIAGNOSIS — G4750 Parasomnia, unspecified: Secondary | ICD-10-CM

## 2016-12-16 DIAGNOSIS — T07XXXA Unspecified multiple injuries, initial encounter: Secondary | ICD-10-CM

## 2016-12-16 DIAGNOSIS — F1010 Alcohol abuse, uncomplicated: Secondary | ICD-10-CM

## 2016-12-16 DIAGNOSIS — F329 Major depressive disorder, single episode, unspecified: Secondary | ICD-10-CM | POA: Diagnosis not present

## 2016-12-16 DIAGNOSIS — E878 Other disorders of electrolyte and fluid balance, not elsewhere classified: Secondary | ICD-10-CM

## 2016-12-16 LAB — BASIC METABOLIC PANEL
BUN: 5 mg/dL — ABNORMAL LOW (ref 6–23)
CALCIUM: 9.5 mg/dL (ref 8.4–10.5)
CHLORIDE: 102 meq/L (ref 96–112)
CO2: 29 meq/L (ref 19–32)
CREATININE: 0.57 mg/dL (ref 0.40–1.20)
GFR: 119.1 mL/min (ref >60.00)
GLUCOSE: 95 mg/dL (ref 70–99)
Potassium: 3.8 mEq/L (ref 3.5–5.1)
Sodium: 138 mEq/L (ref 135–145)
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
## 2016-12-16 NOTE — Progress Notes (Signed)
Pre visit review using our clinic review tool, if applicable. No additional management support is needed unless otherwise documented below in the visit note. 

## 2016-12-16 NOTE — Progress Notes (Signed)
   TAEJAH OHALLORAN 51 y.o. January 15, 1966 [id number]  Assessment & Plan:   Encounter Diagnoses  Name Primary?  . Alcoholic hepatitis without ascites Yes  . Alcoholic fatty liver     We reviewed her CT findings that raise ? Of cirrhosis though labs do not. Abstinence from alcohol - she is doing that again - is plan and f/u me in June  MC:[id number], [name], [place]  Subjective:   Chief Complaint: Liver disease, CT results  HPI Here w/ husband - dropped in to review CT results - thought had appointment. Not on EtOH x few weeks - doing ok. Worried about the CT Some nausea  Medications, allergies, past medical history, past surgical history, family history and social history are reviewed and updated in the EMR.   Review of Systems As above  Objective:   Physical Exam BP 108/64 (BP Location: Left Arm, Patient Position: Sitting, Cuff Size: Normal)   Pulse 76   Ht 5\' 2"  (1.575 m)   Wt 127 lb (57.6 kg)   BMI 23.23 kg/m  NAD alopecia  15 minutes time spent with patient > half in counseling coordination of care

## 2016-12-17 ENCOUNTER — Other Ambulatory Visit: Payer: Self-pay | Admitting: Emergency Medicine

## 2016-12-17 NOTE — Telephone Encounter (Signed)
Last rx 11/23/16 #30  Last OV: 12/16/16

## 2016-12-18 MED ORDER — SUVOREXANT 10 MG PO TABS: 1.0000 | ORAL_TABLET | Freq: Every day | ORAL | 2 refills | Status: DC

## 2016-12-18 NOTE — Telephone Encounter (Signed)
Rx faxed to the pharmacy.

## 2016-12-21 ENCOUNTER — Other Ambulatory Visit: Payer: Self-pay | Admitting: Physician Assistant

## 2016-12-21 DIAGNOSIS — R238 Other skin changes: Secondary | ICD-10-CM

## 2016-12-21 DIAGNOSIS — R233 Spontaneous ecchymoses: Secondary | ICD-10-CM

## 2016-12-28 DIAGNOSIS — G4750 Parasomnia, unspecified: Secondary | ICD-10-CM | POA: Insufficient documentation

## 2016-12-28 DIAGNOSIS — E878 Other disorders of electrolyte and fluid balance, not elsewhere classified: Secondary | ICD-10-CM | POA: Insufficient documentation

## 2016-12-28 NOTE — Assessment & Plan Note (Signed)
Much improved. Home environment is a significant trigger. Will continue current medications as directed. Continue avoidance of benzodiazepines giving her history. Counseling discussed. Handout given. She is to schedule an appointment. Follow-up with me discussed at visit.

## 2016-12-28 NOTE — Assessment & Plan Note (Signed)
Followed by Gastroenterology. Follow-up scheduled with Dr. Carlean Purl. Alcohol cessation is a must. Patient is making strides with this. Reviewed programs with [facility] for her to consider.

## 2016-12-28 NOTE — Assessment & Plan Note (Signed)
Much improved.  Continue current regimen. 
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
## 2016-12-28 NOTE — Assessment & Plan Note (Signed)
Repeat labs today

## 2016-12-28 NOTE — Assessment & Plan Note (Signed)
Is making improvements. Needs complete cessation. Discussed AA and outpatient treatment programs. She is to give some thought to this.

## 2016-12-28 NOTE — Assessment & Plan Note (Signed)
Mentioned at end of visit. Referral to Neurology for further assessment of nighttime tics.

## 2016-12-30 ENCOUNTER — Other Ambulatory Visit: Payer: Self-pay | Admitting: Physician Assistant

## 2017-01-07 ENCOUNTER — Ambulatory Visit: Payer: Self-pay | Admitting: Internal Medicine

## 2017-01-08 ENCOUNTER — Encounter: Payer: Self-pay | Admitting: Physician Assistant

## 2017-01-08 ENCOUNTER — Ambulatory Visit (INDEPENDENT_AMBULATORY_CARE_PROVIDER_SITE_OTHER): Payer: BLUE CROSS/BLUE SHIELD | Admitting: Physician Assistant

## 2017-01-08 VITALS — BP 122/86 | HR 109 | Temp 98.0°F | Resp 16 | Ht 62.0 in | Wt 119.0 lb

## 2017-01-08 DIAGNOSIS — R112 Nausea with vomiting, unspecified: Secondary | ICD-10-CM

## 2017-01-08 DIAGNOSIS — E860 Dehydration: Secondary | ICD-10-CM | POA: Diagnosis not present

## 2017-01-08 DIAGNOSIS — R630 Anorexia: Secondary | ICD-10-CM | POA: Diagnosis not present

## 2017-01-08 NOTE — Progress Notes (Signed)
Patient initially scheduled for follow-up for anxiety but has significant acute concerns today. Patient endorses 1.5 weeks of nausea associated with non-bloody emesis. Notes she is unable to keep morning fluids down without throwing up. States she can only take little sips of water. Has not eaten in a day. Denies abdominal pain, diarrhea, change to urinary habits. Denies fever, chills. Notes fatigue and leg cramping intermittently. Denies chest pain, SOB, lightheadedness or dizziness. Was unable to see GI (Dr. Carlean Purl) for follow-up regarding liver cirrhosis.   Past Medical History:  Diagnosis Date  . Alcoholic hepatitis   . Aortic atherosclerosis ([place])   . Arthritis    neck  . Carpal tunnel syndrome on both sides 02/2012  . Cigarette nicotine dependence   . Contact lens/glasses fitting    wears contacts or glasses  . Dental crowns present    x 2 upper front  . Elevated LFTs   . Fatty liver   . Fluid retention   . History of MRSA infection   . Hypertension   . Insomnia   . Migraines   . Osteoarthritis   . Parasomnia   . Wears partial dentures    lower    Current Outpatient Prescriptions on File Prior to Visit  Medication Sig Dispense Refill  . busPIRone (BUSPAR) 7.5 MG tablet Take 1 tablet (7.5 mg total) by mouth 2 (two) times daily. 60 tablet 1  . fluocinonide (LIDEX) 0.05 % external solution APPLY ON THE SKIN BID PRN  0  . FLUoxetine (PROZAC) 20 MG capsule TAKE ONE CAPSULE BY MOUTH TWICE DAILY    . gabapentin (NEURONTIN) 600 MG tablet TK 1 T PO TID PRN  2  . hydrochlorothiazide (HYDRODIURIL) 25 MG tablet TAKE 1 TABLET(25 MG) BY MOUTH DAILY    . HYDROcodone-acetaminophen (NORCO) 10-325 MG tablet TK 1 T PO QID PRN    . loratadine (CLARITIN) 10 MG tablet TAKE 1 TABLET BY MOUTH EVERY DAY    . nicotine (NICODERM CQ) 14 mg/24hr patch Place 1 patch (14 mg total) onto the skin daily. 28 patch 0  . Omega-3 Fatty Acids (FISH OIL) 1000 MG CAPS Take 1 capsule (1,000 mg total) by mouth  daily. 30 capsule 0  . omeprazole (PRILOSEC) 20 MG capsule TAKE 1 CAPSULE BY MOUTH TWICE DAILY    . Suvorexant (BELSOMRA) 10 MG TABS Take 1 tablet by mouth at bedtime. 30 tablet 2   No current facility-administered medications on file prior to visit.     No Known Allergies  Family History  Problem Relation Age of Onset  . Diabetes Mother   . Hypertension Mother   . Colon polyps Sister   . Hypertension Sister   . Diabetes Sister   . Lung cancer Maternal Grandmother   . Colon cancer Neg Hx   . Esophageal cancer Neg Hx   . Pancreatic cancer Neg Hx   . Stomach cancer Neg Hx   . Liver disease Neg Hx     Social History   Social History  . Marital status: Married    Spouse name: N/A  . Number of children: N/A  . Years of education: N/A   Occupational History  . cleaning    Social History Main Topics  . Smoking status: Former Smoker    Packs/day: 0.50    Years: 10.00    Types: Cigarettes  . Smokeless tobacco: Never Used     Comment: now has patch 11/18/16  . Alcohol use Yes     Comment: 2  Mikes hard drinks; now has only occasional as of 11/18/16   . Drug use: No  . Sexual activity: No   Other Topics Concern  . Not on file   Social History Narrative  . No narrative on file    Review of Systems - See HPI.  All other ROS are negative.  There were no vitals taken for this visit.  Physical Exam  Constitutional: She is well-developed, well-nourished, and in no distress.  HENT:  Head: Normocephalic and atraumatic.  Mouth/Throat: Mucous membranes are dry and not cyanotic.  Eyes: Conjunctivae are normal.  Neck: Neck supple.  Cardiovascular: Regular rhythm, normal heart sounds and intact distal pulses.   slight tachycardia noted.  Pulmonary/Chest: Effort normal.  Abdominal: Soft. Bowel sounds are normal. She exhibits no distension and no mass. There is no tenderness. There is no rebound and no guarding.  Skin: Skin is warm and dry. No rash noted.  Slight decrease in  skin turgor noted  Psychiatric: Affect normal.  Vitals reviewed.   Recent Results (from the past 2160 hour(s))  Hepatic function panel     Status: Abnormal   Collection Time: 11/02/16 11:25 AM  Result Value Ref Range   Total Bilirubin 0.4 0.2 - 1.2 mg/dL   Bilirubin, Direct 0.1 0.0 - 0.3 mg/dL   Alkaline Phosphatase 146 (H) 39 - 117 U/L   AST 97 (H) 0 - 37 U/L   ALT 37 (H) 0 - 35 U/L   Total Protein 6.7 6.0 - 8.3 g/dL   Albumin 4.2 3.5 - 5.2 g/dL  Acute Hep Panel & Hep B Surface Ab     Status: None   Collection Time: 11/09/16  2:07 PM  Result Value Ref Range   Hepatitis B Surface Ag NEGATIVE NEGATIVE   Hep B C IgM NON REACTIVE NON REACTIVE    Comment: High levels of Hepatitis B Core IgM antibody are detectable during the acute stage of Hepatitis B. This antibody is used to differentiate current from past HBV infection.      Hep B S Ab NEG NEGATIVE   Hep A IgM NON REACTIVE NON REACTIVE   HCV Ab NEGATIVE NEGATIVE  CBC with Differential/Platelet     Status: Abnormal   Collection Time: 11/18/16 11:35 AM  Result Value Ref Range   WBC 10.1 4.0 - 10.5 K/uL   RBC 3.23 (L) 3.87 - 5.11 Mil/uL   Hemoglobin 12.1 12.0 - 15.0 g/dL   HCT 35.3 (L) 36.0 - 46.0 %   MCV 109.3 (H) 78.0 - 100.0 fl   MCHC 34.2 30.0 - 36.0 g/dL   RDW 16.1 (H) 11.5 - 15.5 %   Platelets 171.0 150.0 - 400.0 K/uL   Neutrophils Relative % 74.9 43.0 - 77.0 %   Lymphocytes Relative 12.1 12.0 - 46.0 %   Monocytes Relative 11.8 3.0 - 12.0 %   Eosinophils Relative 0.8 0.0 - 5.0 %   Basophils Relative 0.4 0.0 - 3.0 %   Neutro Abs 7.5 1.4 - 7.7 K/uL   Lymphs Abs 1.2 0.7 - 4.0 K/uL   Monocytes Absolute 1.2 (H) 0.1 - 1.0 K/uL   Eosinophils Absolute 0.1 0.0 - 0.7 K/uL   Basophils Absolute 0.0 0.0 - 0.1 K/uL  Comprehensive metabolic panel     Status: Abnormal   Collection Time: 11/18/16 11:35 AM  Result Value Ref Range   Sodium 138 135 - 145 mEq/L   Potassium 5.0 3.5 - 5.1 mEq/L   Chloride 96 96 - 112 mEq/L  CO2 35  (H) 19 - 32 mEq/L   Glucose, Bld 107 (H) 70 - 99 mg/dL   BUN 4 (L) 6 - 23 mg/dL   Creatinine, Ser 0.57 0.40 - 1.20 mg/dL   Total Bilirubin 0.5 0.2 - 1.2 mg/dL   Alkaline Phosphatase 147 (H) 39 - 117 U/L   AST 145 (H) 0 - 37 U/L   ALT 60 (H) 0 - 35 U/L   Total Protein 7.0 6.0 - 8.3 g/dL   Albumin 3.7 3.5 - 5.2 g/dL   Calcium 9.7 8.4 - 10.5 mg/dL   GFR 119.14 >60.00 mL/min  Protime-INR     Status: Abnormal   Collection Time: 11/18/16 11:35 AM  Result Value Ref Range   INR 1.2 (H) 0.8 - 1.0 ratio   Prothrombin Time 12.8 9.6 - 13.1 sec  Lipid panel     Status: Abnormal   Collection Time: 11/30/16  3:13 PM  Result Value Ref Range   Cholesterol 179 0 - 200 mg/dL    Comment: ATP III Classification       Desirable:  < 200 mg/dL               Borderline High:  200 - 239 mg/dL          High:  > = 240 mg/dL   Triglycerides (H) 0.0 - 149.0 mg/dL    443.0 Triglyceride is over 400; calculations on Lipids are invalid.    Comment: Normal:  <150 mg/dLBorderline High:  150 - 199 mg/dL   HDL 21.70 (L) >39.00 mg/dL   Total CHOL/HDL Ratio 8     Comment:                Men          Women1/2 Average Risk     3.4          3.3Average Risk          5.0          4.42X Average Risk          9.6          7.13X Average Risk          15.0          11.0                      Hemoglobin A1c     Status: None   Collection Time: 11/30/16  3:13 PM  Result Value Ref Range   Hgb A1c MFr Bld 6.0 4.6 - 6.5 %    Comment: Glycemic Control Guidelines for People with Diabetes:Non Diabetic:  <6%Goal of Therapy: <7%Additional Action Suggested:  >8%   Comp Met (CMET)     Status: Abnormal   Collection Time: 11/30/16  3:13 PM  Result Value Ref Range   Sodium 131 (L) 135 - 145 mEq/L   Potassium 3.3 (L) 3.5 - 5.1 mEq/L   Chloride 90 (L) 96 - 112 mEq/L   CO2 32 19 - 32 mEq/L   Glucose, Bld 74 70 - 99 mg/dL   BUN 5 (L) 6 - 23 mg/dL   Creatinine, Ser 0.54 0.40 - 1.20 mg/dL   Total Bilirubin 0.5 0.2 - 1.2 mg/dL   Alkaline  Phosphatase 193 (H) 39 - 117 U/L   AST 170 (H) 0 - 37 U/L   ALT 88 (H) 0 - 35 U/L   Total Protein 7.1 6.0 - 8.3 g/dL   Albumin 4.0 3.5 -  5.2 g/dL   Calcium 9.2 8.4 - 03.7 mg/dL   GFR 928.62 >87.04 mL/min  PTH, Intact and Calcium     Status: None   Collection Time: 11/30/16  3:13 PM  Result Value Ref Range   PTH 61 14 - 64 pg/mL   Calcium 9.1 8.6 - 10.4 mg/dL    Comment:   Interpretive Guide:                              Intact PTH               Calcium                              ----------               ------- Normal Parathyroid           Normal                   Normal Hypoparathyroidism           Low or Low Normal        Low Hyperparathyroidism      Primary                 Normal or High           High      Secondary               High                     Normal or Low      Tertiary                High                     High Non-Parathyroid   Hypercalcemia              Low or Low Normal        High   PTT     Status: None   Collection Time: 11/30/16  3:13 PM  Result Value Ref Range   aPTT 31.2 23.4 - 32.7 SEC  LDL cholesterol, direct     Status: None   Collection Time: 11/30/16  3:13 PM  Result Value Ref Range   Direct LDL 106.0 mg/dL    Comment: Optimal:  <824 mg/dLNear or Above Optimal:  100-129 mg/dLBorderline High:  130-159 mg/dLHigh:  160-189 mg/dLVery High:  >190 mg/dL  Basic metabolic panel     Status: Abnormal   Collection Time: 12/16/16  1:50 PM  Result Value Ref Range   Sodium 138 135 - 145 mEq/L   Potassium 3.8 3.5 - 5.1 mEq/L   Chloride 102 96 - 112 mEq/L   CO2 29 19 - 32 mEq/L   Glucose, Bld 95 70 - 99 mg/dL   BUN 5 (L) 6 - 23 mg/dL   Creatinine, Ser 7.81 0.40 - 1.20 mg/dL   Calcium 9.5 8.4 - 45.9 mg/dL   GFR 715.52 >38.75 mL/min   Assessment/Plan: 1. Non-intractable vomiting with nausea, unspecified vomiting type 2. Anorexia 3. Dehydration - secondary to #1 4. Liver Cirrhosis 5. Alcohol Abuse  Patient with clinical signs of dehydration  secondary to nausea and vomiting from gastritis versus other process. Mildly tachycardic with normal rhythm. Vitals otherwise stable. Discussed nned for IV fluids. Discussed  ER assessment and patient agrees. Discussed ambulance but patient prefers to have husband take her. Patient stable for car transport.    Leeanne Rio, PA-C

## 2017-01-08 NOTE — Patient Instructions (Signed)
Please keep sipping on the bottle of water. I am having your husband take you to the ER for IV fluids and more acute assessment of current issues.

## 2017-01-09 ENCOUNTER — Emergency Department (HOSPITAL_COMMUNITY): Payer: BLUE CROSS/BLUE SHIELD

## 2017-01-09 ENCOUNTER — Encounter (HOSPITAL_COMMUNITY): Payer: Self-pay | Admitting: *Deleted

## 2017-01-09 ENCOUNTER — Emergency Department (HOSPITAL_COMMUNITY)
Admission: EM | Admit: 2017-01-09 | Discharge: 2017-01-09 | Disposition: A | Discharge status: Home / Self Care | Payer: BLUE CROSS/BLUE SHIELD | Attending: Emergency Medicine | Admitting: Emergency Medicine

## 2017-01-09 DIAGNOSIS — R930 Abnormal findings on diagnostic imaging of skull and head, not elsewhere classified: Secondary | ICD-10-CM | POA: Diagnosis not present

## 2017-01-09 DIAGNOSIS — Z87891 Personal history of nicotine dependence: Secondary | ICD-10-CM | POA: Insufficient documentation

## 2017-01-09 DIAGNOSIS — R112 Nausea with vomiting, unspecified: Secondary | ICD-10-CM | POA: Diagnosis present

## 2017-01-09 DIAGNOSIS — Z79899 Other long term (current) drug therapy: Secondary | ICD-10-CM | POA: Insufficient documentation

## 2017-01-09 DIAGNOSIS — I10 Essential (primary) hypertension: Secondary | ICD-10-CM | POA: Insufficient documentation

## 2017-01-09 DIAGNOSIS — E876 Hypokalemia: Secondary | ICD-10-CM | POA: Insufficient documentation

## 2017-01-09 DIAGNOSIS — K759 Inflammatory liver disease, unspecified: Secondary | ICD-10-CM | POA: Insufficient documentation

## 2017-01-09 DIAGNOSIS — R1110 Vomiting, unspecified: Secondary | ICD-10-CM

## 2017-01-09 LAB — URINALYSIS, ROUTINE W REFLEX MICROSCOPIC
Bilirubin Urine: NEGATIVE
Glucose, UA: NEGATIVE mg/dL
Hgb urine dipstick: NEGATIVE
Ketones, ur: NEGATIVE mg/dL
LEUKOCYTES UA: NEGATIVE
NITRITE: NEGATIVE
Protein, ur: NEGATIVE mg/dL
SPECIFIC GRAVITY, URINE: 1.009 (ref 1.005–1.030)
pH: 7 (ref 5.0–8.0)

## 2017-01-09 LAB — COMPREHENSIVE METABOLIC PANEL
ALK PHOS: 244 U/L — AB (ref 38–126)
ALT: 41 U/L (ref 14–54)
ANION GAP: 13 (ref 5–15)
AST: 117 U/L — ABNORMAL HIGH (ref 15–41)
Albumin: 4.4 g/dL (ref 3.5–5.0)
BILIRUBIN TOTAL: 1.6 mg/dL — AB (ref 0.3–1.2)
BUN: 10 mg/dL (ref 6–20)
CALCIUM: 9.5 mg/dL (ref 8.9–10.3)
CO2: 26 mmol/L (ref 22–32)
Chloride: 92 mmol/L — ABNORMAL LOW (ref 101–111)
Creatinine, Ser: 0.69 mg/dL (ref 0.44–1.00)
GFR calc non Af Amer: >60 mL/min (ref >60)
Glucose, Bld: 83 mg/dL (ref 65–99)
POTASSIUM: 2.7 mmol/L — AB (ref 3.5–5.1)
SODIUM: 131 mmol/L — AB (ref 135–145)
TOTAL PROTEIN: 7.5 g/dL (ref 6.5–8.1)

## 2017-01-09 LAB — CBC
HCT: 38.3 % (ref 36.0–46.0)
HEMOGLOBIN: 12.8 g/dL (ref 12.0–15.0)
MCH: 36.2 pg — ABNORMAL HIGH (ref 26.0–34.0)
MCHC: 33.4 g/dL (ref 30.0–36.0)
MCV: 108.2 fL — ABNORMAL HIGH (ref 78.0–100.0)
Platelets: 153 10*3/uL (ref 150–400)
RBC: 3.54 MIL/uL — AB (ref 3.87–5.11)
RDW: 13.6 % (ref 11.5–15.5)
WBC: 8.1 10*3/uL (ref 4.0–10.5)

## 2017-01-09 LAB — LIPASE, BLOOD: Lipase: 60 U/L — ABNORMAL HIGH (ref 11–51)

## 2017-01-09 MED ORDER — ONDANSETRON 4 MG PO TBDP: ORAL_TABLET | ORAL | 0 refills | Status: DC

## 2017-01-09 MED ORDER — POTASSIUM CHLORIDE CRYS ER 20 MEQ PO TBCR: 20.0000 meq | EXTENDED_RELEASE_TABLET | Freq: Every day | ORAL | 0 refills | Status: DC

## 2017-01-09 MED ORDER — ONDANSETRON HCL 4 MG/2ML IJ SOLN
4.0000 mg | Freq: Once | INTRAMUSCULAR | Status: AC
  Administered 2017-01-09: 4 mg via INTRAVENOUS
  Filled 2017-01-09: qty 2

## 2017-01-09 MED ORDER — POTASSIUM CHLORIDE 10 MEQ/100ML IV SOLN
10.0000 meq | INTRAVENOUS | Status: AC
  Administered 2017-01-09 (×2): 10 meq via INTRAVENOUS
  Filled 2017-01-09 (×2): qty 100

## 2017-01-09 MED ORDER — LACTATED RINGERS IV BOLUS (SEPSIS)
1000.0000 mL | Freq: Once | INTRAVENOUS | Status: AC
  Administered 2017-01-09: 1000 mL via INTRAVENOUS

## 2017-01-09 MED ORDER — POTASSIUM CHLORIDE CRYS ER 20 MEQ PO TBCR
40.0000 meq | EXTENDED_RELEASE_TABLET | Freq: Once | ORAL | Status: AC
  Administered 2017-01-09: 40 meq via ORAL
  Filled 2017-01-09: qty 2

## 2017-01-09 MED ORDER — PROMETHAZINE HCL 25 MG/ML IJ SOLN
12.5000 mg | Freq: Once | INTRAMUSCULAR | Status: AC
  Administered 2017-01-09: 12.5 mg via INTRAVENOUS
  Filled 2017-01-09: qty 1

## 2017-01-09 NOTE — ED Notes (Signed)
Pt returned from [place] and placed back on monitor.

## 2017-01-09 NOTE — ED Provider Notes (Signed)
Patient care was taken over from Dr. Dolly Rias. Patient presented with nausea and vomiting. She was found to have elevated liver enzymes and alk phosphatase however this is similar to her prior values. She has a very minimal elevation in her lipase which she's also had in the past. She has a history of hepatitis related to her prior alcohol use and is followed by Dr. Carlean Purl. She had a right upper quadrant ultrasound which was negative for acute abnormality. She also was noted to be hypokalemic and was given IV fluids as well as potassium replacement. I will also discharge her with a few days of potassium to supplement. She also is given a prescription for Zofran. She's feeling much better in the ED and has no ongoing vomiting. No significant abdominal pain on exam. She was discharged home in good condition. She was encouraged to follow with Dr. Carlean Purl. She also had a head CT given that she been having some worsening headaches. Her headaches are bifrontal. CT shows some acute on chronic sinusitis. She doesn't have any fever sinus tenderness on exam or drainage. She does have a nasal steroid at home which she hasn't been using. I encouraged her to use this and follow-up with her PCP if her symptoms are not improving. This point I did not feel that she needs be started on antibiotics.   Malvin Johns, MD 01/09/17 [phone]

## 2017-01-09 NOTE — ED Notes (Signed)
Patient transported to X-ray, will attempt to take pt CT after

## 2017-01-09 NOTE — ED Triage Notes (Signed)
Pt reports ongoing n/v/d for over two weeks. Has been to pcp and schedule GI appt but pt states she was too sick to go to her appt. Pt sent here for dehydration and iv fluids, states she is having calf pain and cramping today.

## 2017-01-09 NOTE — ED Notes (Signed)
ED Provider at bedside. 

## 2017-01-09 NOTE — ED Notes (Signed)
1st potassium run stopped at 2015, new potassium bag started (2nd run) at 2016

## 2017-01-09 NOTE — ED Notes (Signed)
Pt transported to [facility]

## 2017-01-16 ENCOUNTER — Other Ambulatory Visit: Payer: Self-pay | Admitting: Physician Assistant

## 2017-01-19 NOTE — ED Provider Notes (Signed)
[facility] DEPT Provider Note   CSN: [id number] Arrival date & time: 01/09/17  1231     History   Chief Complaint Chief Complaint  Patient presents with  . Emesis    HPI MISCHELL Villa is a 51 y.o. female.   Emesis   This is a recurrent problem. The problem occurs 2 to 4 times per day. The problem has not changed since onset.The emesis has an appearance of stomach contents. There has been no fever.    Past Medical History:  Diagnosis Date  . Alcoholic hepatitis   . Aortic atherosclerosis ([place])   . Arthritis    neck  . Carpal tunnel syndrome on both sides 02/2012  . Cigarette nicotine dependence   . Contact lens/glasses fitting    wears contacts or glasses  . Dental crowns present    x 2 upper front  . Elevated LFTs   . Fatty liver   . Fluid retention   . History of MRSA infection   . Hypertension   . Insomnia   . Migraines   . Osteoarthritis   . Parasomnia   . Wears partial dentures    lower    Patient Active Problem List   Diagnosis Date Noted  . Abnormal blood electrolyte level 12/28/2016  . Parasomnia 12/28/2016  . Aortic atherosclerosis ([place]) 12/01/2016  . Multiple fractures 12/01/2016  . Easy bruising 12/01/2016  . Hepatic steatosis 12/01/2016  . Insomnia 12/01/2016  . Anxiety and depression 12/01/2016  . Liver cirrhosis ([place]) 11/16/2016  . Palpitations 08/23/2016  . Nicotine dependence 07/11/2013  . DJD (degenerative joint disease) 07/11/2013  . DDD (degenerative disc disease) 07/11/2013  . Anemia 07/11/2013  . Narcotic abuse 07/11/2013  . CMC arthritis, thumb, degenerative 06/23/2013  . Alcohol abuse 09/02/2010  . PANCREATITIS 09/02/2010  . Benign essential hypertension 06/09/2005    Past Surgical History:  Procedure Laterality Date  . CARPAL TUNNEL RELEASE  03/17/2012   Procedure: CARPAL TUNNEL RELEASE;  Surgeon: Roseanne Kaufman, MD;  Location: [facility];  Service: Orthopedics;  Laterality: Bilateral;  left limited  open carpal tunnel release. right carpal tunnel injection with 1cc of celestone and 2cc of marcaine.25%  . CARPAL TUNNEL RELEASE  06/30/2012   Procedure: CARPAL TUNNEL RELEASE;  Surgeon: Roseanne Kaufman, MD;  Location: [facility];  Service: Orthopedics;  Laterality: Right;  RIGHT LIMITED OPEN CARPAL TUNNEL RELEASE  . CARPOMETACARPAL ([facility]) FUSION OF THUMB Left 06/23/2013   Procedure: LEFT CARPOMETACARPEL ([place]) FUSION OF THUMB WITH AUTOGRAFT FROM RADIUS AND REPAIR AS NECESSARY;  Surgeon: Roseanne Kaufman, MD;  Location: [facility];  Service: Orthopedics;  Laterality: Left;  . HARDWARE REMOVAL Left 07/10/2013   Procedure: HARDWARE REMOVAL;  Surgeon: Roseanne Kaufman, MD;  Location: [facility];  Service: Orthopedics;  Laterality: Left;  . INCISION AND DRAINAGE OF WOUND Left 07/10/2013   Procedure: IRRIGATION AND DEBRIDEMENT WOUND left wrist  ;  Surgeon: Roseanne Kaufman, MD;  Location: [facility];  Service: Orthopedics;  Laterality: Left;  . LAPAROSCOPIC VAGINAL HYSTERECTOMY  02/24/2010  . LAPAROSCOPY  06/2010   for adhesions  . LEEP     x 2  . LUMBAR DISC SURGERY  03/23/2003   left L5-S1 discectomy with microdissection  . LUMBAR DISC SURGERY  05/23/2003   redo discectomy L5-S1 with microdissection    OB History    No data available       Home Medications    Prior to Admission medications   Medication Sig Start Date End Date  Taking? Authorizing Provider  fluocinonide (LIDEX) 0.05 % external solution APPLY ON THE SKIN BID PRN 10/09/16  Yes [provider]  FLUoxetine (PROZAC) 20 MG capsule Take 20 mg by mouth 2 (two) times daily.   Yes [provider]  gabapentin (NEURONTIN) 600 MG tablet Take 600 mg by mouth 3 (three) times daily as needed (pain).   Yes [provider]  hydrochlorothiazide (HYDRODIURIL) 25 MG tablet Take 25 mg by mouth daily.   Yes [provider]  HYDROcodone-acetaminophen (NORCO) 10-325 MG tablet Take 1 tablet by mouth 4 (four) times daily as  needed for moderate pain.   Yes [provider]  loratadine (CLARITIN) 10 MG tablet Take 10 mg by mouth daily.   Yes [provider]  nicotine (NICODERM CQ) 14 mg/24hr patch Place 1 patch (14 mg total) onto the skin daily. 11/09/16  Yes [name], PA-C  omeprazole (PRILOSEC) 20 MG capsule Take 20 mg by mouth 2 (two) times daily before a meal.   Yes [provider]  Suvorexant (BELSOMRA) 10 MG TABS Take 1 tablet by mouth at bedtime. Patient taking differently: Take 10 mg by mouth at bedtime.  12/18/16  Yes [name], PA-C  busPIRone (BUSPAR) 7.5 MG tablet TAKE 1 TABLET(7.5 MG) BY MOUTH TWICE DAILY 01/17/17   [name], PA-C  Omega-3 Fatty Acids (FISH OIL) 1000 MG CAPS Take 1 capsule (1,000 mg total) by mouth daily. Patient not taking: Reported on 01/09/2017 12/03/16   [name], PA-C  ondansetron (ZOFRAN ODT) 4 MG disintegrating tablet 4mg  ODT q4 hours prn nausea/vomit 01/09/17   Malvin Johns, MD  potassium chloride SA (K-DUR,KLOR-CON) 20 MEQ tablet Take 1 tablet (20 mEq total) by mouth daily. 01/09/17   Malvin Johns, MD    Family History Family History  Problem Relation Age of Onset  . Diabetes Mother   . Hypertension Mother   . Colon polyps Sister   . Hypertension Sister   . Diabetes Sister   . Lung cancer Maternal Grandmother   . Colon cancer Neg Hx   . Esophageal cancer Neg Hx   . Pancreatic cancer Neg Hx   . Stomach cancer Neg Hx   . Liver disease Neg Hx     Social History Social History  Substance Use Topics  . Smoking status: Former Smoker    Packs/day: 0.50    Years: 10.00    Types: Cigarettes  . Smokeless tobacco: Never Used     Comment: now has patch 11/18/16  . Alcohol use Yes     Comment: 2 [employer] hard drinks; now has only occasional as of 11/18/16      Allergies   Patient has no known allergies.   Review of Systems Review of Systems  Gastrointestinal: Positive for vomiting.  All other systems reviewed and  are negative.    Physical Exam Updated Vital Signs BP 134/87   Pulse 75   Temp 98.5 F (36.9 C) (Oral)   Resp 16   SpO2 98%   Physical Exam  Constitutional: She is oriented to person, place, and time. She appears well-developed and well-nourished.  HENT:  Head: Normocephalic and atraumatic.  Eyes: Conjunctivae and EOM are normal.  Neck: Normal range of motion.  Cardiovascular: Normal rate and regular rhythm.   Pulmonary/Chest: Effort normal. No stridor. No respiratory distress.  Abdominal: Soft. She exhibits no distension.  Neurological: She is alert and oriented to person, place, and time.  Skin: Skin is warm and dry.  Nursing note and vitals reviewed.    ED Treatments / Results  Labs (all labs ordered are listed, but only abnormal results are displayed) Labs Reviewed  LIPASE, BLOOD - Abnormal; Notable for the following:       Result Value   Lipase 60 (*)    All other components within normal limits  COMPREHENSIVE METABOLIC PANEL - Abnormal; Notable for the following:    Sodium 131 (*)    Potassium 2.7 (*)    Chloride 92 (*)    AST 117 (*)    Alkaline Phosphatase 244 (*)    Total Bilirubin 1.6 (*)    All other components within normal limits  CBC - Abnormal; Notable for the following:    RBC 3.54 (*)    MCV 108.2 (*)    MCH 36.2 (*)    All other components within normal limits  URINALYSIS, ROUTINE W REFLEX MICROSCOPIC    EKG  EKG Interpretation None       Radiology No results found.  Procedures Procedures (including critical care time)  Medications Ordered in ED Medications  lactated ringers bolus 1,000 mL (0 mLs Intravenous Stopped 01/09/17 1536)  ondansetron (ZOFRAN) injection 4 mg (4 mg Intravenous Given 01/09/17 1340)  potassium chloride SA (K-DUR,KLOR-CON) CR tablet 40 mEq (40 mEq Oral Given 01/09/17 1529)  lactated ringers bolus 1,000 mL (0 mLs Intravenous Stopped 01/09/17 1846)  promethazine (PHENERGAN) injection 12.5 mg (12.5 mg Intravenous  Given 01/09/17 1645)  potassium chloride 10 mEq in 100 mL IVPB (0 mEq Intravenous Stopped 01/09/17 2016)     Initial Impression / Assessment and Plan / ED Course  I have reviewed the triage vital signs and the nursing notes.  Pertinent labs & imaging results that were available during my care of the patient were reviewed by me and considered in my medical decision making (see chart for details).     Vomiting, headache and abdominal pain.  Concern for mass, cholecystitis, hepatitis.  Ct ok.  Elevated enzymes, will get [place] to eval same.  At time of care transfer, pending [place], likely discharge home.   Final Clinical Impressions(s) / ED Diagnoses   Final diagnoses:  Vomiting  Hypokalemia  Hepatitis     Ionia Schey, Corene Cornea, MD 01/19/17 [phone]

## 2017-01-27 ENCOUNTER — Encounter: Payer: Self-pay | Admitting: Hematology

## 2017-01-27 ENCOUNTER — Telehealth: Payer: Self-pay | Admitting: Hematology

## 2017-01-27 ENCOUNTER — Telehealth: Payer: Self-pay | Admitting: Physician Assistant

## 2017-01-27 NOTE — Telephone Encounter (Signed)
Patient states she is not clear on why she is being referred to see a hematologist.  Is this something she really needs?  She is requesting a call back explaining why she is being referred.

## 2017-01-27 NOTE — Telephone Encounter (Signed)
Hem appt scheduled for the pt to see Dr. Irene Limbo on 8/2 at 1pm. Pt agreed to the appt date and time. Insuance and address verified. Letter mailed.

## 2017-01-28 NOTE — Telephone Encounter (Signed)
Advised patient the referral to hematology is for her increased bruising. She will keep appointment.

## 2017-02-05 ENCOUNTER — Ambulatory Visit: Payer: Self-pay | Admitting: Physician Assistant

## 2017-02-09 ENCOUNTER — Other Ambulatory Visit: Payer: Self-pay | Admitting: Physician Assistant

## 2017-02-18 ENCOUNTER — Encounter: Payer: Self-pay | Admitting: Hematology

## 2017-02-27 ENCOUNTER — Emergency Department (HOSPITAL_COMMUNITY): Payer: BLUE CROSS/BLUE SHIELD

## 2017-02-27 ENCOUNTER — Encounter (HOSPITAL_COMMUNITY): Payer: Self-pay | Admitting: Emergency Medicine

## 2017-02-27 ENCOUNTER — Emergency Department (HOSPITAL_COMMUNITY)
Admission: EM | Admit: 2017-02-27 | Discharge: 2017-02-28 | Disposition: A | Discharge status: Home / Self Care | Payer: BLUE CROSS/BLUE SHIELD | Attending: Emergency Medicine | Admitting: Emergency Medicine

## 2017-02-27 DIAGNOSIS — F1910 Other psychoactive substance abuse, uncomplicated: Secondary | ICD-10-CM

## 2017-02-27 DIAGNOSIS — Z79899 Other long term (current) drug therapy: Secondary | ICD-10-CM | POA: Diagnosis not present

## 2017-02-27 DIAGNOSIS — T402X1A Poisoning by other opioids, accidental (unintentional), initial encounter: Secondary | ICD-10-CM | POA: Insufficient documentation

## 2017-02-27 DIAGNOSIS — I10 Essential (primary) hypertension: Secondary | ICD-10-CM | POA: Diagnosis not present

## 2017-02-27 DIAGNOSIS — E876 Hypokalemia: Secondary | ICD-10-CM | POA: Diagnosis not present

## 2017-02-27 DIAGNOSIS — Z87891 Personal history of nicotine dependence: Secondary | ICD-10-CM | POA: Diagnosis not present

## 2017-02-27 DIAGNOSIS — F10920 Alcohol use, unspecified with intoxication, uncomplicated: Secondary | ICD-10-CM

## 2017-02-27 DIAGNOSIS — F10120 Alcohol abuse with intoxication, uncomplicated: Secondary | ICD-10-CM | POA: Diagnosis not present

## 2017-02-27 DIAGNOSIS — T40601A Poisoning by unspecified narcotics, accidental (unintentional), initial encounter: Secondary | ICD-10-CM

## 2017-02-27 LAB — CBC WITH DIFFERENTIAL/PLATELET
BASOS ABS: 0 10*3/uL (ref 0.0–0.1)
Basophils Relative: 0 %
EOS PCT: 1 %
Eosinophils Absolute: 0.1 10*3/uL (ref 0.0–0.7)
HCT: 38.9 % (ref 36.0–46.0)
HEMOGLOBIN: 13.4 g/dL (ref 12.0–15.0)
LYMPHS ABS: 1.6 10*3/uL (ref 0.7–4.0)
LYMPHS PCT: 19 %
MCH: 37.1 pg — AB (ref 26.0–34.0)
MCHC: 34.4 g/dL (ref 30.0–36.0)
MCV: 107.8 fL — AB (ref 78.0–100.0)
Monocytes Absolute: 0.5 10*3/uL (ref 0.1–1.0)
Monocytes Relative: 6 %
NEUTROS PCT: 74 %
Neutro Abs: 6.3 10*3/uL (ref 1.7–7.7)
PLATELETS: 180 10*3/uL (ref 150–400)
RBC: 3.61 MIL/uL — AB (ref 3.87–5.11)
RDW: 14.8 % (ref 11.5–15.5)
WBC: 8.4 10*3/uL (ref 4.0–10.5)

## 2017-02-27 LAB — COMPREHENSIVE METABOLIC PANEL
ALK PHOS: 173 U/L — AB (ref 38–126)
ALT: 44 U/L (ref 14–54)
ANION GAP: 13 (ref 5–15)
AST: 139 U/L — ABNORMAL HIGH (ref 15–41)
Albumin: 3.6 g/dL (ref 3.5–5.0)
BUN: <5 mg/dL — ABNORMAL LOW (ref 6–20)
CALCIUM: 8.3 mg/dL — AB (ref 8.9–10.3)
CO2: 22 mmol/L (ref 22–32)
CREATININE: 0.45 mg/dL (ref 0.44–1.00)
Chloride: 102 mmol/L (ref 101–111)
Glucose, Bld: 148 mg/dL — ABNORMAL HIGH (ref 65–99)
Potassium: 2.9 mmol/L — ABNORMAL LOW (ref 3.5–5.1)
SODIUM: 137 mmol/L (ref 135–145)
TOTAL PROTEIN: 7.1 g/dL (ref 6.5–8.1)
Total Bilirubin: 0.4 mg/dL (ref 0.3–1.2)

## 2017-02-27 LAB — CBG MONITORING, ED: GLUCOSE-CAPILLARY: 132 mg/dL — AB (ref 65–99)

## 2017-02-27 LAB — RAPID URINE DRUG SCREEN, HOSP PERFORMED
Amphetamines: NOT DETECTED
BARBITURATES: NOT DETECTED
Benzodiazepines: POSITIVE — AB
Cocaine: NOT DETECTED
Opiates: POSITIVE — AB
Tetrahydrocannabinol: NOT DETECTED

## 2017-02-27 LAB — ACETAMINOPHEN LEVEL

## 2017-02-27 LAB — AMMONIA: AMMONIA: 42 umol/L — AB (ref 9–35)

## 2017-02-27 LAB — MAGNESIUM: Magnesium: 2.1 mg/dL (ref 1.7–2.4)

## 2017-02-27 LAB — SALICYLATE LEVEL

## 2017-02-27 LAB — ETHANOL: Alcohol, Ethyl (B): 323 mg/dL (ref <5)

## 2017-02-27 MED ORDER — HALOPERIDOL LACTATE 5 MG/ML IJ SOLN
10.0000 mg | Freq: Once | INTRAMUSCULAR | Status: AC
  Administered 2017-02-27: 10 mg via INTRAMUSCULAR
  Filled 2017-02-27: qty 2

## 2017-02-27 MED ORDER — SODIUM CHLORIDE 0.9 % IV BOLUS (SEPSIS)
1000.0000 mL | Freq: Once | INTRAVENOUS | Status: AC
  Administered 2017-02-27: 1000 mL via INTRAVENOUS

## 2017-02-27 MED ORDER — LORAZEPAM 2 MG/ML IJ SOLN
2.0000 mg | Freq: Once | INTRAMUSCULAR | Status: AC
  Administered 2017-02-27: 2 mg via INTRAVENOUS
  Filled 2017-02-27: qty 1

## 2017-02-27 MED ORDER — SODIUM CHLORIDE 0.9 % IV SOLN
INTRAVENOUS | Status: DC
  Administered 2017-02-27 – 2017-02-28 (×2): via INTRAVENOUS

## 2017-02-27 MED ORDER — POTASSIUM CHLORIDE 10 MEQ/100ML IV SOLN
10.0000 meq | Freq: Once | INTRAVENOUS | Status: AC
  Administered 2017-02-27: 10 meq via INTRAVENOUS
  Filled 2017-02-27: qty 100

## 2017-02-27 MED ORDER — DIPHENHYDRAMINE HCL 50 MG/ML IJ SOLN
25.0000 mg | Freq: Once | INTRAMUSCULAR | Status: AC
Start: 2017-02-27 — End: 2017-02-27
  Administered 2017-02-27: 25 mg via INTRAVENOUS
  Filled 2017-02-27: qty 1

## 2017-02-27 MED ORDER — ZIPRASIDONE MESYLATE 20 MG IM SOLR
10.0000 mg | Freq: Once | INTRAMUSCULAR | Status: AC
  Administered 2017-02-27: 10 mg via INTRAMUSCULAR

## 2017-02-27 MED ORDER — PROPOFOL 10 MG/ML IV BOLUS
0.5000 mg/kg | Freq: Once | INTRAVENOUS | Status: DC
  Filled 2017-02-27: qty 20

## 2017-02-27 NOTE — ED Triage Notes (Signed)
Pt found apneic and unresponsive by EMS. Pt received 1 IN and 1 IV narcan. Pt became combative. Pt was then given 5 IV versed. Pt arrives to ED screaming and combative.

## 2017-02-27 NOTE — ED Notes (Signed)
Date and time results received: 02/27/17 1925 (use smartphrase ".now" to insert current time)  Test: Ethenol Critical Value: 323  Name of Provider Notified: Dr.Haviland   Orders Received? Or Actions Taken?:

## 2017-02-27 NOTE — ED Provider Notes (Signed)
[facility] DEPT Provider Note   CSN: [id number] Arrival date & time: 02/27/17  1806     History   Chief Complaint Chief Complaint  Patient presents with  . Drug Overdose    HPI Amy Villa is a 51 y.o. female.  Pt presents to the ED today with a possible drug overdose.  A family member called EMS and EMS found pt apneic and unresponsive.  The pt did respond to a total of 2 mg of narcan (1 IN and 1 IV).  The pt became very combative after narcan aroused pt, so she was given 5 mg of versed IV en route.  The pt is still screaming and combative.  It is unclear what she overdosed on.  EMS reports no drug paraphernalia around pt.  We have no idea how long pt was apneic.       Past Medical History:  Diagnosis Date  . Alcoholic hepatitis   . Aortic atherosclerosis ([place])   . Arthritis    neck  . Carpal tunnel syndrome on both sides 02/2012  . Cigarette nicotine dependence   . Contact lens/glasses fitting    wears contacts or glasses  . Dental crowns present    x 2 upper front  . Elevated LFTs   . Fatty liver   . Fluid retention   . History of MRSA infection   . Hypertension   . Insomnia   . Migraines   . Osteoarthritis   . Parasomnia   . Wears partial dentures    lower    Patient Active Problem List   Diagnosis Date Noted  . Abnormal blood electrolyte level 12/28/2016  . Parasomnia 12/28/2016  . Aortic atherosclerosis ([place]) 12/01/2016  . Multiple fractures 12/01/2016  . Easy bruising 12/01/2016  . Hepatic steatosis 12/01/2016  . Insomnia 12/01/2016  . Anxiety and depression 12/01/2016  . Liver cirrhosis ([place]) 11/16/2016  . Palpitations 08/23/2016  . Nicotine dependence 07/11/2013  . DJD (degenerative joint disease) 07/11/2013  . DDD (degenerative disc disease) 07/11/2013  . Anemia 07/11/2013  . Narcotic abuse 07/11/2013  . CMC arthritis, thumb, degenerative 06/23/2013  . Alcohol abuse 09/02/2010  . PANCREATITIS 09/02/2010  . Benign essential  hypertension 06/09/2005    Past Surgical History:  Procedure Laterality Date  . CARPAL TUNNEL RELEASE  03/17/2012   Procedure: CARPAL TUNNEL RELEASE;  Surgeon: Roseanne Kaufman, MD;  Location: [facility];  Service: Orthopedics;  Laterality: Bilateral;  left limited open carpal tunnel release. right carpal tunnel injection with 1cc of celestone and 2cc of marcaine.25%  . CARPAL TUNNEL RELEASE  06/30/2012   Procedure: CARPAL TUNNEL RELEASE;  Surgeon: Roseanne Kaufman, MD;  Location: [facility];  Service: Orthopedics;  Laterality: Right;  RIGHT LIMITED OPEN CARPAL TUNNEL RELEASE  . CARPOMETACARPAL ([facility]) FUSION OF THUMB Left 06/23/2013   Procedure: LEFT CARPOMETACARPEL ([place]) FUSION OF THUMB WITH AUTOGRAFT FROM RADIUS AND REPAIR AS NECESSARY;  Surgeon: Roseanne Kaufman, MD;  Location: [facility];  Service: Orthopedics;  Laterality: Left;  . HARDWARE REMOVAL Left 07/10/2013   Procedure: HARDWARE REMOVAL;  Surgeon: Roseanne Kaufman, MD;  Location: [facility];  Service: Orthopedics;  Laterality: Left;  . INCISION AND DRAINAGE OF WOUND Left 07/10/2013   Procedure: IRRIGATION AND DEBRIDEMENT WOUND left wrist  ;  Surgeon: Roseanne Kaufman, MD;  Location: [facility];  Service: Orthopedics;  Laterality: Left;  . LAPAROSCOPIC VAGINAL HYSTERECTOMY  02/24/2010  . LAPAROSCOPY  06/2010   for adhesions  . LEEP  x 2  . LUMBAR DISC SURGERY  03/23/2003   left L5-S1 discectomy with microdissection  . LUMBAR DISC SURGERY  05/23/2003   redo discectomy L5-S1 with microdissection    OB History    No data available       Home Medications    Prior to Admission medications   Medication Sig Start Date End Date Taking? Authorizing Provider  busPIRone (BUSPAR) 7.5 MG tablet TAKE 1 TABLET(7.5 MG) BY MOUTH TWICE DAILY 02/09/17   [name], PA-C  fluocinonide (LIDEX) 0.05 % external solution APPLY ON THE SKIN BID PRN 10/09/16   [provider]  FLUoxetine (PROZAC) 20 MG capsule Take 20 mg by  mouth 2 (two) times daily.    [provider]  gabapentin (NEURONTIN) 600 MG tablet Take 600 mg by mouth 3 (three) times daily as needed (pain).    [provider]  hydrochlorothiazide (HYDRODIURIL) 25 MG tablet Take 25 mg by mouth daily.    [provider]  HYDROcodone-acetaminophen (NORCO) 10-325 MG tablet Take 1 tablet by mouth 4 (four) times daily as needed for moderate pain.    [provider]  loratadine (CLARITIN) 10 MG tablet Take 10 mg by mouth daily.    [provider]  nicotine (NICODERM CQ) 14 mg/24hr patch Place 1 patch (14 mg total) onto the skin daily. 11/09/16   [name], PA-C  Omega-3 Fatty Acids (FISH OIL) 1000 MG CAPS Take 1 capsule (1,000 mg total) by mouth daily. Patient not taking: Reported on 01/09/2017 12/03/16   [name], PA-C  omeprazole (PRILOSEC) 20 MG capsule Take 20 mg by mouth 2 (two) times daily before a meal.    [provider]  ondansetron (ZOFRAN ODT) 4 MG disintegrating tablet 4mg  ODT q4 hours prn nausea/vomit 01/09/17   Malvin Johns, MD  potassium chloride SA (K-DUR,KLOR-CON) 20 MEQ tablet Take 1 tablet (20 mEq total) by mouth daily. 01/09/17   Malvin Johns, MD  Suvorexant (BELSOMRA) 10 MG TABS Take 1 tablet by mouth at bedtime. Patient taking differently: Take 10 mg by mouth at bedtime.  12/18/16   [name], PA-C    Family History Family History  Problem Relation Age of Onset  . Diabetes Mother   . Hypertension Mother   . Colon polyps Sister   . Hypertension Sister   . Diabetes Sister   . Lung cancer Maternal Grandmother   . Colon cancer Neg Hx   . Esophageal cancer Neg Hx   . Pancreatic cancer Neg Hx   . Stomach cancer Neg Hx   . Liver disease Neg Hx     Social History Social History  Substance Use Topics  . Smoking status: Former Smoker    Packs/day: 0.50    Years: 10.00    Types: Cigarettes  . Smokeless tobacco: Never Used     Comment: now has patch 11/18/16  .  Alcohol use Yes     Comment: 2 [employer] hard drinks; now has only occasional as of 11/18/16      Allergies   Patient has no known allergies.   Review of Systems Review of Systems  Unable to perform ROS: Mental status change     Physical Exam Updated Vital Signs BP (!) 191/104 (BP Location: Right Arm)   Pulse 90   Temp 98.6 F (37 C) (Rectal)   Resp 11   Wt 54 kg (119 lb 0.8 oz)   SpO2 100%   BMI 21.77 kg/m   Physical  Exam  Constitutional: She appears distressed.  HENT:  Head: Normocephalic and atraumatic.  Right Ear: External ear normal.  Left Ear: External ear normal.  Nose: Nose normal.  Mouth/Throat: Oropharynx is clear and moist.  Eyes: Pupils are equal, round, and reactive to light.  Neck: Normal range of motion. Neck supple.  Cardiovascular: Regular rhythm, normal heart sounds and intact distal pulses.  Tachycardia present.   Pulmonary/Chest: Effort normal and breath sounds normal.  Abdominal: Soft. Bowel sounds are normal.  Musculoskeletal: Normal range of motion.  Neurological:  Pt is moving all 4 extremities.  She is not following commands.  Psychiatric: Her affect is angry. She is agitated and aggressive.  Nursing note and vitals reviewed.    ED Treatments / Results  Labs (all labs ordered are listed, but only abnormal results are displayed) Labs Reviewed  COMPREHENSIVE METABOLIC PANEL - Abnormal; Notable for the following:       Result Value   Potassium 2.9 (*)    Glucose, Bld 148 (*)    BUN <5 (*)    Calcium 8.3 (*)    AST 139 (*)    Alkaline Phosphatase 173 (*)    All other components within normal limits  ACETAMINOPHEN LEVEL - Abnormal; Notable for the following:    Acetaminophen (Tylenol), Serum <10 (*)    All other components within normal limits  ETHANOL - Abnormal; Notable for the following:    Alcohol, Ethyl (B) 323 (*)    All other components within normal limits  RAPID URINE DRUG SCREEN, HOSP PERFORMED - Abnormal; Notable for the  following:    Opiates POSITIVE (*)    Benzodiazepines POSITIVE (*)    All other components within normal limits  CBC WITH DIFFERENTIAL/PLATELET - Abnormal; Notable for the following:    RBC 3.61 (*)    MCV 107.8 (*)    MCH 37.1 (*)    All other components within normal limits  AMMONIA - Abnormal; Notable for the following:    Ammonia 42 (*)    All other components within normal limits  CBG MONITORING, ED - Abnormal; Notable for the following:    Glucose-Capillary 132 (*)    All other components within normal limits  SALICYLATE LEVEL  MAGNESIUM    EKG  EKG Interpretation  Date/Time:  Saturday February 27 2017 18:21:43 EDT Ventricular Rate:  105 PR Interval:    QRS Duration: 84 QT Interval:  343 QTC Calculation: 454 R Axis:   65 Text Interpretation:  Sinus tachycardia Nonspecific T abnrm, anterolateral leads Confirmed by Isla Pence [phone]) on 02/27/2017 7:00:42 PM       Radiology Ct Head Wo Contrast  Result Date: 02/28/2017 CLINICAL DATA:  Initial evaluation for acute altered mental status, unresponsiveness. EXAM: CT HEAD WITHOUT CONTRAST TECHNIQUE: Contiguous axial images were obtained from the base of the skull through the vertex without intravenous contrast. COMPARISON:  Prior CT from 01/09/2017. FINDINGS: Brain: Mildly advanced cerebral atrophy for age. No acute intracranial hemorrhage. No evidence for acute large vessel territory infarct. No mass lesion, midline shift or mass effect. No hydrocephalus. No extra-axial fluid collection. Vascular: No hyperdense vessel. Scattered vascular calcifications noted within the carotid siphons. Skull: Scalp soft tissues and calvarium within normal limits. Sinuses/Orbits: Visualized globes and orbital soft tissues within normal limits. Scattered mucosal thickening within the visualized ethmoidal air cells. Visualized paranasal sinuses are otherwise clear. No mastoid effusion. IMPRESSION: 1. No acute intracranial process. 2. Mildly  advanced cerebral atrophy for age. Electronically Signed   By:  Jeannine Boga M.D.   On: 02/28/2017 01:55   Dg Chest Port 1 View  Result Date: 02/27/2017 CLINICAL DATA:  Initial evaluation for acute mental status change. EXAM: PORTABLE CHEST 1 VIEW COMPARISON:  Prior radiograph from 01/09/2017. FINDINGS: Cardiac and mediastinal silhouettes are stable in size and contour, and remain within normal limits. Lungs hypoinflated. No focal infiltrates. No pulmonary edema or pleural effusion. No pneumothorax. No acute osseus abnormality. IMPRESSION: Shallow lung inflation. No other active cardiopulmonary disease identified. Electronically Signed   By: Jeannine Boga M.D.   On: 02/27/2017 18:31    Procedures Procedures (including critical care time)  Medications Ordered in ED Medications  sodium chloride 0.9 % bolus 1,000 mL (0 mLs Intravenous Stopped 02/27/17 2015)  ziprasidone (GEODON) injection 10 mg (10 mg Intramuscular Given 02/27/17 1800)  LORazepam (ATIVAN) injection 2 mg (2 mg Intravenous Given 02/27/17 2040)  potassium chloride 10 mEq in 100 mL IVPB (0 mEq Intravenous Stopped 02/27/17 2056)  LORazepam (ATIVAN) injection 2 mg (2 mg Intravenous Given 02/27/17 2153)  haloperidol lactate (HALDOL) injection 10 mg (10 mg Intramuscular Given 02/27/17 2154)  diphenhydrAMINE (BENADRYL) injection 25 mg (25 mg Intravenous Given 02/27/17 2155)     Initial Impression / Assessment and Plan / ED Course  I have reviewed the triage vital signs and the nursing notes.  Pertinent labs & imaging results that were available during my care of the patient were reviewed by me and considered in my medical decision making (see chart for details).  Potassium is low and is replaced.   Ct head pending at shift change.  The pt is still not back to her normal mental status.  Pt signed out to Dr. Dina Rich at shift change.  Final Clinical Impressions(s) / ED Diagnoses   Final diagnoses:  Polysubstance abuse    Opiate overdose, accidental or unintentional, initial encounter  Alcoholic intoxication without complication ([place])  Hypokalemia  Essential hypertension    New Prescriptions Discharge Medication List as of 02/28/2017  5:18 AM       Isla Pence, MD 02/28/17 1213

## 2017-02-27 NOTE — ED Notes (Signed)
Belongings placed in cabinet above nurses station near 19-22, including wallet by security.

## 2017-02-28 ENCOUNTER — Emergency Department (HOSPITAL_COMMUNITY): Payer: BLUE CROSS/BLUE SHIELD

## 2017-02-28 NOTE — ED Notes (Signed)
Patient ambulated well to restroom with standby assist.

## 2017-02-28 NOTE — ED Notes (Signed)
Writer located pts husbands home #. He is coming to get her asap.

## 2017-02-28 NOTE — Discharge Instructions (Signed)
You were seen today after being found unresponsive. This is likely related to both alcohol use and drug overdose.  See the resources provided for counseling and rehabilitation. Increase intake of high potassium foods including bananas.

## 2017-02-28 NOTE — ED Notes (Signed)
Patient unable to remember husbands cell #. Called business phone and left message.

## 2017-02-28 NOTE — ED Notes (Signed)
Patient alert and oriented x4 upon discharge. Denies pain. AVS reviewed with pt and questions answered. Provider aware of pts elevated BP and advised pt to take her prescribed diuretic as directed when she gets home. Pt reported she had not taken it in several days.

## 2017-02-28 NOTE — ED Notes (Signed)
Patient more alert and oriented, states she does not recall what brought her in tonight.   Patient given diet coke to drink, ok per provider

## 2017-02-28 NOTE — ED Provider Notes (Signed)
5:18 AM Patient is now awake. She does not remember anything that happened. She denies any opioid use but UDS is positive and she responded to Narcan. She is also significantly intoxicated. She is able to ambulate to the bathroom and tolerate fluids. Feel she is safe for discharge home. She was provided resources for outpatient counseling and detox. She was mildly hypokalemic. This was replaced. She has been hypertensive but otherwise asymptomatic. Patient instructed to take her medications as instructed and follow-up closely with her primary physician.  After history, exam, and medical workup I feel the patient has been appropriately medically screened and is safe for discharge home. Pertinent diagnoses were discussed with the patient. Patient was given return precautions.    Amy Hacker, MD 02/28/17 [phone]

## 2017-03-01 ENCOUNTER — Telehealth: Payer: Self-pay | Admitting: Physician Assistant

## 2017-03-01 ENCOUNTER — Other Ambulatory Visit: Payer: Self-pay | Admitting: Physician Assistant

## 2017-03-01 NOTE — Telephone Encounter (Signed)
Patient states she has run out of her medication and wants to know if pcp can call in to pharmacy today.  Pharmacy:  [facility], [place] - [address] [place] [address] [phone] (Phone) [phone] (Fax)

## 2017-03-02 MED ORDER — BUSPIRONE HCL 7.5 MG PO TABS: ORAL_TABLET | ORAL | 0 refills | Status: DC

## 2017-03-02 NOTE — Telephone Encounter (Signed)
Medications have been sent

## 2017-03-03 ENCOUNTER — Telehealth: Payer: Self-pay

## 2017-03-03 NOTE — Telephone Encounter (Signed)
LM requesting call back to schedule ER f/u with PCP.

## 2017-03-04 NOTE — Telephone Encounter (Signed)
LM requesting call back to schedule ER follow up appt.

## 2017-03-05 NOTE — Telephone Encounter (Signed)
Left message requesting CB to schedule ER follow up appt. Third attempt to reach patient, will send MyChart message.

## 2017-03-05 NOTE — Telephone Encounter (Signed)
Noted  

## 2017-03-12 ENCOUNTER — Telehealth: Payer: Self-pay | Admitting: *Deleted

## 2017-03-12 ENCOUNTER — Ambulatory Visit (INDEPENDENT_AMBULATORY_CARE_PROVIDER_SITE_OTHER): Payer: BLUE CROSS/BLUE SHIELD | Admitting: Physician Assistant

## 2017-03-12 ENCOUNTER — Encounter: Payer: Self-pay | Admitting: Physician Assistant

## 2017-03-12 VITALS — BP 140/100 | HR 100 | Temp 98.4°F | Resp 14 | Ht 62.0 in | Wt 124.0 lb

## 2017-03-12 DIAGNOSIS — F419 Anxiety disorder, unspecified: Secondary | ICD-10-CM | POA: Diagnosis not present

## 2017-03-12 DIAGNOSIS — I10 Essential (primary) hypertension: Secondary | ICD-10-CM | POA: Diagnosis not present

## 2017-03-12 DIAGNOSIS — F329 Major depressive disorder, single episode, unspecified: Secondary | ICD-10-CM | POA: Diagnosis not present

## 2017-03-12 DIAGNOSIS — F1910 Other psychoactive substance abuse, uncomplicated: Secondary | ICD-10-CM | POA: Diagnosis not present

## 2017-03-12 DIAGNOSIS — E876 Hypokalemia: Secondary | ICD-10-CM | POA: Diagnosis not present

## 2017-03-12 DIAGNOSIS — F32A Depression, unspecified: Secondary | ICD-10-CM

## 2017-03-12 LAB — BASIC METABOLIC PANEL
BUN: 6 mg/dL (ref 6–23)
CALCIUM: 9.3 mg/dL (ref 8.4–10.5)
CO2: 30 meq/L (ref 19–32)
CREATININE: 0.56 mg/dL (ref 0.40–1.20)
Chloride: 92 mEq/L — ABNORMAL LOW (ref 96–112)
GFR: 121.44 mL/min (ref >60.00)
GLUCOSE: 187 mg/dL — AB (ref 70–99)
Potassium: 3.5 mEq/L (ref 3.5–5.1)
SODIUM: 131 meq/L — AB (ref 135–145)

## 2017-03-12 MED ORDER — FLUOXETINE HCL 40 MG PO CAPS: 40.0000 mg | ORAL_CAPSULE | Freq: Every day | ORAL | 3 refills | Status: DC

## 2017-03-12 MED ORDER — BUSPIRONE HCL 10 MG PO TABS: 10.0000 mg | ORAL_TABLET | Freq: Two times a day (BID) | ORAL | 3 refills | Status: DC

## 2017-03-12 NOTE — Telephone Encounter (Signed)
Left message for patient to call back to discuss. I want to verify that she has understanding of the medications called in.

## 2017-03-12 NOTE — Telephone Encounter (Signed)
No -- we increased the dose of her Fluoxetine and BuSapr. Those would be the medications she would need to pick up.

## 2017-03-12 NOTE — Progress Notes (Signed)
Pre visit review using our clinic review tool, if applicable. No additional management support is needed unless otherwise documented below in the visit note. 

## 2017-03-12 NOTE — Patient Instructions (Signed)
Please go to the lab for blood work. We will call with your results.   Please start the new doses of Fluoxetine (Prozac) and Buspirone (BuSpar). Take as directed.  Please call [facility] for inpatient treatment for alcohol use.  This will be extremely beneficial for you.  Keep hydrated. Eat a well-balanced diet.   Let me know when you are going to [facility].

## 2017-03-12 NOTE — Telephone Encounter (Signed)
Patient returned call.  Stated understanding about medications.  She has picked up the new prescriptions from the pharmacy.

## 2017-03-12 NOTE — Progress Notes (Signed)
Patient presents to clinic today for ER follow-up. Patient was taken to ER on 02/27/17 after husband found her unconscious. Patient had UDS positive for opiates, benzodiazepines and alcohol. Patient was given Narcan with improvement. Was noted to be significantly intoxicated. Patient stabilized in ER and discharged home.  Since discharge, patient notes having difficulty with sleep secondary to pain levels as she has no pain medication presently. Notes depressed mood daily with anhedonia and anxiety, despite current dose of BuSpar and Prozac. Denies SI/HI. Patient has cut back on drinking stating that the episode leading to ER visit was atypical for her. Is drinking a few mikes hard lemonades per day despite current alcoholic hepatitis with elevated LFTs. Is wanting to discuss options for inpatient treatment for alcoholism. States she has been to different places previously but due to lack of insurance previously, could only stay a couple of days per time.   Past Medical History:  Diagnosis Date  . Alcoholic hepatitis   . Aortic atherosclerosis ([place])   . Arthritis    neck  . Carpal tunnel syndrome on both sides 02/2012  . Cigarette nicotine dependence   . Contact lens/glasses fitting    wears contacts or glasses  . Dental crowns present    x 2 upper front  . Elevated LFTs   . Fatty liver   . Fluid retention   . History of MRSA infection   . Hypertension   . Insomnia   . Migraines   . Osteoarthritis   . Parasomnia   . Wears partial dentures    lower    Current Outpatient Prescriptions on File Prior to Visit  Medication Sig Dispense Refill  . fluocinonide (LIDEX) 0.05 % external solution APPLY ON THE SKIN BID PRN  0  . gabapentin (NEURONTIN) 600 MG tablet Take 600 mg by mouth 3 (three) times daily as needed (pain).    . hydrochlorothiazide (HYDRODIURIL) 25 MG tablet Take 25 mg by mouth daily.    [name] loratadine (CLARITIN) 10 MG tablet Take 10 mg by mouth daily.    . Omega-3 Fatty Acids  (FISH OIL) 1000 MG CAPS Take 1 capsule (1,000 mg total) by mouth daily. 30 capsule 0  . omeprazole (PRILOSEC) 20 MG capsule Take 20 mg by mouth 2 (two) times daily before a meal.    . potassium chloride SA (K-DUR,KLOR-CON) 20 MEQ tablet Take 1 tablet (20 mEq total) by mouth daily. 3 tablet 0  . HYDROcodone-acetaminophen (NORCO) 10-325 MG tablet Take 1 tablet by mouth 4 (four) times daily as needed for moderate pain.     No current facility-administered medications on file prior to visit.     No Known Allergies  Family History  Problem Relation Age of Onset  . Diabetes Mother   . Hypertension Mother   . Colon polyps Sister   . Hypertension Sister   . Diabetes Sister   . Lung cancer Maternal Grandmother   . Colon cancer Neg Hx   . Esophageal cancer Neg Hx   . Pancreatic cancer Neg Hx   . Stomach cancer Neg Hx   . Liver disease Neg Hx     Social History   Social History  . Marital status: Married    Spouse name: N/A  . Number of children: N/A  . Years of education: N/A   Occupational History  . cleaning    Social History Main Topics  . Smoking status: Former Smoker    Packs/day: 0.50    Years: 10.00  Types: Cigarettes  . Smokeless tobacco: Never Used     Comment: now has patch 11/18/16  . Alcohol use Yes     Comment: 2 Mikes hard drinks; now has only occasional as of 11/18/16   . Drug use: No  . Sexual activity: No   Other Topics Concern  . None   Social History Narrative  . None    Review of Systems - See HPI.  All other ROS are negative.  BP (!) 140/100   Pulse 100   Temp 98.4 F (36.9 C) (Oral)   Resp 14   Ht '5\' 2"'$  (1.575 m)   Wt 124 lb (56.2 kg)   SpO2 98%   BMI 22.68 kg/m   Physical Exam  Skin: Skin is warm and dry. No rash noted.  Psychiatric: Her mood appears anxious. Her affect is not labile and not inappropriate. She is not agitated. She exhibits a depressed mood. She expresses no homicidal and no suicidal ideation. She expresses no suicidal  plans and no homicidal plans. She exhibits ordered thought content.    Recent Results (from the past 2160 hour(s))  Basic metabolic panel     Status: Abnormal   Collection Time: 12/16/16  1:50 PM  Result Value Ref Range   Sodium 138 135 - 145 mEq/L   Potassium 3.8 3.5 - 5.1 mEq/L   Chloride 102 96 - 112 mEq/L   CO2 29 19 - 32 mEq/L   Glucose, Bld 95 70 - 99 mg/dL   BUN 5 (L) 6 - 23 mg/dL   Creatinine, Ser 0.57 0.40 - 1.20 mg/dL   Calcium 9.5 8.4 - 10.5 mg/dL   GFR 119.10 >60.00 mL/min  Lipase, blood     Status: Abnormal   Collection Time: 01/09/17  1:22 PM  Result Value Ref Range   Lipase 60 (H) 11 - 51 U/L  Comprehensive metabolic panel     Status: Abnormal   Collection Time: 01/09/17  1:22 PM  Result Value Ref Range   Sodium 131 (L) 135 - 145 mmol/L   Potassium 2.7 (LL) 3.5 - 5.1 mmol/L    Comment: CRITICAL RESULT CALLED TO, READ BACK BY AND VERIFIED WITH: B.SHANAS RN @ 1422 01/09/17 BY C.EDENS    Chloride 92 (L) 101 - 111 mmol/L   CO2 26 22 - 32 mmol/L   Glucose, Bld 83 65 - 99 mg/dL   BUN 10 6 - 20 mg/dL   Creatinine, Ser 0.69 0.44 - 1.00 mg/dL   Calcium 9.5 8.9 - 10.3 mg/dL   Total Protein 7.5 6.5 - 8.1 g/dL   Albumin 4.4 3.5 - 5.0 g/dL   AST 117 (H) 15 - 41 U/L   ALT 41 14 - 54 U/L   Alkaline Phosphatase 244 (H) 38 - 126 U/L   Total Bilirubin 1.6 (H) 0.3 - 1.2 mg/dL   GFR calc non Af Amer >60 >60 mL/min   GFR calc Af Amer >60 >60 mL/min    Comment: (NOTE) The eGFR has been calculated using the CKD EPI equation. This calculation has not been validated in all clinical situations. eGFR's persistently <60 mL/min signify possible Chronic Kidney Disease.    Anion gap 13 5 - 15  CBC     Status: Abnormal   Collection Time: 01/09/17  1:22 PM  Result Value Ref Range   WBC 8.1 4.0 - 10.5 K/uL   RBC 3.54 (L) 3.87 - 5.11 MIL/uL   Hemoglobin 12.8 12.0 - 15.0 g/dL   HCT 38.3  36.0 - 46.0 %   MCV 108.2 (H) 78.0 - 100.0 fL   MCH 36.2 (H) 26.0 - 34.0 pg   MCHC 33.4 30.0 -  36.0 g/dL   RDW 13.6 11.5 - 15.5 %   Platelets 153 150 - 400 K/uL  Urinalysis, Routine w reflex microscopic     Status: None   Collection Time: 01/09/17  3:12 PM  Result Value Ref Range   Color, Urine YELLOW YELLOW   APPearance CLEAR CLEAR   Specific Gravity, Urine 1.009 1.005 - 1.030   pH 7.0 5.0 - 8.0   Glucose, UA NEGATIVE NEGATIVE mg/dL   Hgb urine dipstick NEGATIVE NEGATIVE   Bilirubin Urine NEGATIVE NEGATIVE   Ketones, ur NEGATIVE NEGATIVE mg/dL   Protein, ur NEGATIVE NEGATIVE mg/dL   Nitrite NEGATIVE NEGATIVE   Leukocytes, UA NEGATIVE NEGATIVE  Urine rapid drug screen (hosp performed)     Status: Abnormal   Collection Time: 02/27/17  6:16 PM  Result Value Ref Range   Opiates POSITIVE (A) NONE DETECTED   Cocaine NONE DETECTED NONE DETECTED   Benzodiazepines POSITIVE (A) NONE DETECTED   Amphetamines NONE DETECTED NONE DETECTED   Tetrahydrocannabinol NONE DETECTED NONE DETECTED   Barbiturates NONE DETECTED NONE DETECTED    Comment:        DRUG SCREEN FOR MEDICAL PURPOSES ONLY.  IF CONFIRMATION IS NEEDED FOR ANY PURPOSE, NOTIFY LAB WITHIN 5 DAYS.        LOWEST DETECTABLE LIMITS FOR URINE DRUG SCREEN Drug Class       Cutoff (ng/mL) Amphetamine      1000 Barbiturate      200 Benzodiazepine   101 Tricyclics       751 Opiates          300 Cocaine          300 THC              50   Magnesium     Status: None   Collection Time: 02/27/17  6:32 PM  Result Value Ref Range   Magnesium 2.1 1.7 - 2.4 mg/dL  Comprehensive metabolic panel     Status: Abnormal   Collection Time: 02/27/17  6:34 PM  Result Value Ref Range   Sodium 137 135 - 145 mmol/L   Potassium 2.9 (L) 3.5 - 5.1 mmol/L   Chloride 102 101 - 111 mmol/L   CO2 22 22 - 32 mmol/L   Glucose, Bld 148 (H) 65 - 99 mg/dL   BUN <5 (L) 6 - 20 mg/dL   Creatinine, Ser 0.45 0.44 - 1.00 mg/dL   Calcium 8.3 (L) 8.9 - 10.3 mg/dL   Total Protein 7.1 6.5 - 8.1 g/dL   Albumin 3.6 3.5 - 5.0 g/dL   AST 139 (H) 15 - 41 U/L    ALT 44 14 - 54 U/L   Alkaline Phosphatase 173 (H) 38 - 126 U/L   Total Bilirubin 0.4 0.3 - 1.2 mg/dL   GFR calc non Af Amer >60 >60 mL/min   GFR calc Af Amer >60 >60 mL/min    Comment: (NOTE) The eGFR has been calculated using the CKD EPI equation. This calculation has not been validated in all clinical situations. eGFR's persistently <60 mL/min signify possible Chronic Kidney Disease.    Anion gap 13 5 - 15  Salicylate level     Status: None   Collection Time: 02/27/17  6:34 PM  Result Value Ref Range   Salicylate Lvl <0.2 2.8 - 30.0 mg/dL  Acetaminophen level     Status: Abnormal   Collection Time: 02/27/17  6:34 PM  Result Value Ref Range   Acetaminophen (Tylenol), Serum <10 (L) 10 - 30 ug/mL    Comment:        THERAPEUTIC CONCENTRATIONS VARY SIGNIFICANTLY. A RANGE OF 10-30 ug/mL MAY BE AN EFFECTIVE CONCENTRATION FOR MANY PATIENTS. HOWEVER, SOME ARE BEST TREATED AT CONCENTRATIONS OUTSIDE THIS RANGE. ACETAMINOPHEN CONCENTRATIONS >150 ug/mL AT 4 HOURS AFTER INGESTION AND >50 ug/mL AT 12 HOURS AFTER INGESTION ARE OFTEN ASSOCIATED WITH TOXIC REACTIONS.   Ethanol     Status: Abnormal   Collection Time: 02/27/17  6:34 PM  Result Value Ref Range   Alcohol, Ethyl (B) 323 (HH) <5 mg/dL    Comment:        LOWEST DETECTABLE LIMIT FOR SERUM ALCOHOL IS 5 mg/dL FOR MEDICAL PURPOSES ONLY CRITICAL RESULT CALLED TO, READ BACK BY AND VERIFIED WITH: COGGINS,I RN 1930 [phone] [place],N   CBC WITH DIFFERENTIAL     Status: Abnormal   Collection Time: 02/27/17  6:34 PM  Result Value Ref Range   WBC 8.4 4.0 - 10.5 K/uL   RBC 3.61 (L) 3.87 - 5.11 MIL/uL   Hemoglobin 13.4 12.0 - 15.0 g/dL   HCT 31.6 77.7 - 31.7 %   MCV 107.8 (H) 78.0 - 100.0 fL   MCH 37.1 (H) 26.0 - 34.0 pg   MCHC 34.4 30.0 - 36.0 g/dL   RDW 91.5 24.8 - 49.4 %   Platelets 180 150 - 400 K/uL   Neutrophils Relative % 74 %   Neutro Abs 6.3 1.7 - 7.7 K/uL   Lymphocytes Relative 19 %   Lymphs Abs 1.6 0.7 - 4.0 K/uL     Monocytes Relative 6 %   Monocytes Absolute 0.5 0.1 - 1.0 K/uL   Eosinophils Relative 1 %   Eosinophils Absolute 0.1 0.0 - 0.7 K/uL   Basophils Relative 0 %   Basophils Absolute 0.0 0.0 - 0.1 K/uL  Ammonia     Status: Abnormal   Collection Time: 02/27/17  6:34 PM  Result Value Ref Range   Ammonia 42 (H) 9 - 35 umol/L  CBG monitoring, ED     Status: Abnormal   Collection Time: 02/27/17  6:43 PM  Result Value Ref Range   Glucose-Capillary 132 (H) 65 - 99 mg/dL  Basic metabolic panel     Status: Abnormal   Collection Time: 03/12/17 11:46 AM  Result Value Ref Range   Sodium 131 (L) 135 - 145 mEq/L   Potassium 3.5 3.5 - 5.1 mEq/L   Chloride 92 (L) 96 - 112 mEq/L   CO2 30 19 - 32 mEq/L   Glucose, Bld 187 (H) 70 - 99 mg/dL   BUN 6 6 - 23 mg/dL   Creatinine, Ser 8.35 0.40 - 1.20 mg/dL   Calcium 9.3 8.4 - 59.9 mg/dL   GFR 768.23 >57.75 mL/min    Assessment/Plan: Anxiety and depression Will increase Fluoxetine to 40 mg daily and BuSpar to 10 mg BID. Patient to start counseling. Is going to get treatment at [facility] for polysubstance abuse. No controlled medications will be given from this practice.   Hypokalemia Repeat BMP today.  Substance abuse Patient was followed by pain management, getting opiate therapy. Also inappropriately tested positive for benzos. No controlled medications to be given from a [facility] practice. She is doing well today but knows she needs intensive therapy. She is going to be going to [facility].   Benign  essential hypertension Secondary to age, alcohol abuse. Will reassess potassium today to make further changes in HTN regimen.     Leeanne Rio, PA-C

## 2017-03-12 NOTE — Telephone Encounter (Signed)
Patient called stating that she thought that she was supposed to be getting a RX for Zoloft today and the pharmacy says they have not gotten it.   Patient aware that I am sending to provider for verification and that I will call her back as soon as I know something.

## 2017-03-13 DIAGNOSIS — F1910 Other psychoactive substance abuse, uncomplicated: Secondary | ICD-10-CM | POA: Insufficient documentation

## 2017-03-13 DIAGNOSIS — E876 Hypokalemia: Secondary | ICD-10-CM | POA: Insufficient documentation

## 2017-03-13 NOTE — Assessment & Plan Note (Signed)
Secondary to age, alcohol abuse. Will reassess potassium today to make further changes in HTN regimen.

## 2017-03-13 NOTE — Assessment & Plan Note (Signed)
Will increase Fluoxetine to 40 mg daily and BuSpar to 10 mg BID. Patient to start counseling. Is going to get treatment at [facility] for polysubstance abuse. No controlled medications will be given from this practice.

## 2017-03-13 NOTE — Assessment & Plan Note (Signed)
Patient was followed by pain management, getting opiate therapy. Also inappropriately tested positive for benzos. No controlled medications to be given from a [facility] practice. She is doing well today but knows she needs intensive therapy. She is going to be going to [facility].

## 2017-03-13 NOTE — Assessment & Plan Note (Signed)
Repeat BMP today. 

## 2017-03-15 ENCOUNTER — Telehealth: Payer: Self-pay | Admitting: *Deleted

## 2017-03-15 NOTE — Telephone Encounter (Signed)
Patient called in concerned over her health.  She is really distressed over her alcohol abuse and says that she has no idea how to stop. She states that she drinks from the time she gets up until the time she's ready to try to go to bed.  She states in the first 3-4 hours that she is up, she typically has 3-4 Mike's Hard Lemonades - this pattern continues all day long.  She states that she is just at a loss of how to stop this behavior.   I looked at the note from the last office visit, and PCP had mentioned [facility] to her.   I mentioned this to patient and she stated that she wasn't sure how to start that process.   I looked up the information from their Web site and began going through the benefits with patient. We talked for about 15 minutes on taking this step to get help to better herself and her health.  She stated that when we got off the phone she was going to call them and talk with them.   I advised patient that if she had any issues to call me back and I would help her out the best I could.  Patient states that she will keep me posted on what she finds out, and where she stands as far as going in for treatment.  Message routed to PCP so that he is aware.

## 2017-03-16 NOTE — Telephone Encounter (Signed)
Thank you for taking care of this in my absence. At her appointment she had a list of names and numbers of suggested sites including [facility] and the [facility]. We had discussed that she was going to call [facility] to seek care. Please follow-up with patient later this week to make sure she has spoken with them.

## 2017-03-16 NOTE — Telephone Encounter (Signed)
Patient states that her husband called [facility] and he feels that is more for drugs and that he feels that she needs to go back to the pain doctor. Patient states that she has both problems and she wants help for both.   I advised patient to call the treatment center for herself and talk to them about her situation and ask if they are willing to help her.  She states that there are several factors that drives her to drinking, and she is aware that she has to get herself out of that environment.  She states that she is going to call them and talk to them.   She is going to see if she can make an appointment and actually view the facility.    Patient was in tears thanking me for calling to follow-up with her.  I let patient know that we want her to do this because we do care about her health and her state-of-being.  I informed her that we would be here every step of the way with her and that we are on her side - but I also informed her that for [place] to be able to help her, she has to be able to pull her weight and take this serious and get help.  Patient asked that I let everyone in this office know that she truly appreciates the care that she received.  She states that she feels cared for here, and she's so thankful to be a part of such a friendly helpful office. Patient stated "Most doctor's offices don't take the time and personal care that you guys do. From the office visits to the phone calls to check on me, you guys truly know what it means to care for someone."   Patient states that she will call when she has an appointment for in-patient care rehab.

## 2017-03-17 NOTE — Telephone Encounter (Signed)
Thank you for making me aware. [facility] can definitely help with alcohol abuse -- I am glad she is looking into this herself instead of just taking her husbands opinion. [facility] also be of [facility].   Also thank you for helping to take good care of Mrs Upshaw.

## 2017-03-25 ENCOUNTER — Other Ambulatory Visit: Payer: Self-pay | Admitting: Physician Assistant

## 2017-03-28 ENCOUNTER — Other Ambulatory Visit: Payer: Self-pay | Admitting: Physician Assistant

## 2017-03-29 ENCOUNTER — Other Ambulatory Visit: Payer: Self-pay | Admitting: Emergency Medicine

## 2017-03-29 NOTE — Telephone Encounter (Signed)
Patient called stating she needed a refill on her Belsomra. I advised patient she would not get anymore controlled medications from the office. She is agreeable but wanted something for sleep. Please advise an medication that is not controlled to help with sleep.

## 2017-03-29 NOTE — Telephone Encounter (Signed)
Last OV 03/12/17 belsomra last filled 12/18/16 #30 with 2

## 2017-03-30 NOTE — Telephone Encounter (Signed)
Patient informed of recommendations. Stated verbal understanding

## 2017-03-30 NOTE — Telephone Encounter (Signed)
Would recommend she try Pure ZZZs over the counter as it is all natural and should help with sleep.

## 2017-04-01 ENCOUNTER — Telehealth: Payer: Self-pay | Admitting: *Deleted

## 2017-04-01 NOTE — Telephone Encounter (Signed)
She can take OTC Ibuprofen if needed but NSAIDs plus alcohol can affect her stomach lining and kidney function. Would recommend she speak with her pain specialist about non-narcotic pain medication. Also -- I am confused. She was to be going to [facility] or the [facility] for inpatient treatment for alcoholism and substance use. Why has she not done this?

## 2017-04-01 NOTE — Telephone Encounter (Signed)
Patient is aware of the NSAIDS issues and states that she has stopped drinking, she states that she has not had anything in a little over a week.  She still states that husband insists that [facility] cannot help her because they deal more with drugs.   Again, I disputed this and told her that they deal with Alcohol and that this is something she needs to do.   Patient again stated understanding.  I encouraged her to do the calling herself so that she can hear first hand what all they can do for her.   Patient is aware that she needs to discuss non-narcotic pain medication with pain specialist.

## 2017-04-01 NOTE — Telephone Encounter (Signed)
Patient called states that she understands PCP does not want her on any controlled substance, and she understands why, but she states that she is in pain still and wondering if a rx for ibuprofen or something like that can be called in for her.

## 2017-04-13 ENCOUNTER — Other Ambulatory Visit: Payer: Self-pay | Admitting: Physician Assistant

## 2017-04-13 ENCOUNTER — Telehealth: Payer: Self-pay | Admitting: Physician Assistant

## 2017-04-13 NOTE — Telephone Encounter (Signed)
Patient requesting refill of omeprazole (PRILOSEC) 20 MG capsule.  Pharmacy:  [facility], [place] - [address] [place] [address] [place] [address] [place] [address] [phone] (Phone) [phone] (Fax)

## 2017-04-14 NOTE — Telephone Encounter (Signed)
LMOVM advising patient to schedule a follow up appointment to check on her Anxiety/Depression. Last visit we increased Prozac.

## 2017-04-19 ENCOUNTER — Encounter: Payer: Self-pay | Admitting: Emergency Medicine

## 2017-04-19 NOTE — Telephone Encounter (Signed)
Mychart message sent.

## 2017-04-23 ENCOUNTER — Encounter: Payer: Self-pay | Admitting: Physician Assistant

## 2017-04-23 ENCOUNTER — Ambulatory Visit (INDEPENDENT_AMBULATORY_CARE_PROVIDER_SITE_OTHER): Payer: BLUE CROSS/BLUE SHIELD | Admitting: Physician Assistant

## 2017-04-23 VITALS — BP 124/80 | HR 74 | Temp 98.2°F | Resp 14 | Ht 62.0 in | Wt 125.0 lb

## 2017-04-23 DIAGNOSIS — F1910 Other psychoactive substance abuse, uncomplicated: Secondary | ICD-10-CM | POA: Diagnosis not present

## 2017-04-23 DIAGNOSIS — F32A Depression, unspecified: Secondary | ICD-10-CM

## 2017-04-23 DIAGNOSIS — K7030 Alcoholic cirrhosis of liver without ascites: Secondary | ICD-10-CM

## 2017-04-23 DIAGNOSIS — F329 Major depressive disorder, single episode, unspecified: Secondary | ICD-10-CM

## 2017-04-23 DIAGNOSIS — F419 Anxiety disorder, unspecified: Secondary | ICD-10-CM

## 2017-04-23 LAB — CBC WITH DIFFERENTIAL/PLATELET
BASOS ABS: 0 10*3/uL (ref 0.0–0.1)
Basophils Relative: 0.5 % (ref 0.0–3.0)
EOS ABS: 0.1 10*3/uL (ref 0.0–0.7)
Eosinophils Relative: 0.7 % (ref 0.0–5.0)
HCT: 37.4 % (ref 36.0–46.0)
Hemoglobin: 12.6 g/dL (ref 12.0–15.0)
LYMPHS ABS: 1.4 10*3/uL (ref 0.7–4.0)
Lymphocytes Relative: 13.8 % (ref 12.0–46.0)
MCHC: 33.8 g/dL (ref 30.0–36.0)
MCV: 111 fl — AB (ref 78.0–100.0)
MONO ABS: 0.7 10*3/uL (ref 0.1–1.0)
MONOS PCT: 6.8 % (ref 3.0–12.0)
NEUTROS PCT: 78.2 % — AB (ref 43.0–77.0)
Neutro Abs: 7.7 10*3/uL (ref 1.4–7.7)
Platelets: 241 10*3/uL (ref 150.0–400.0)
RBC: 3.37 Mil/uL — AB (ref 3.87–5.11)
RDW: 13.8 % (ref 11.5–15.5)
WBC: 9.9 10*3/uL (ref 4.0–10.5)

## 2017-04-23 LAB — COMPREHENSIVE METABOLIC PANEL
ALK PHOS: 217 U/L — AB (ref 39–117)
ALT: 31 U/L (ref 0–35)
AST: 92 U/L — AB (ref 0–37)
Albumin: 3.6 g/dL (ref 3.5–5.2)
BUN: 4 mg/dL — AB (ref 6–23)
CO2: 36 mEq/L — ABNORMAL HIGH (ref 19–32)
Calcium: 9.1 mg/dL (ref 8.4–10.5)
Chloride: 95 mEq/L — ABNORMAL LOW (ref 96–112)
Creatinine, Ser: 0.54 mg/dL (ref 0.40–1.20)
GFR: 126.59 mL/min (ref >60.00)
Glucose, Bld: 93 mg/dL (ref 70–99)
POTASSIUM: 3.4 meq/L — AB (ref 3.5–5.1)
SODIUM: 138 meq/L (ref 135–145)
TOTAL PROTEIN: 6.7 g/dL (ref 6.0–8.3)
Total Bilirubin: 0.6 mg/dL (ref 0.2–1.2)

## 2017-04-23 LAB — AMMONIA: AMMONIA: 53 umol/L — AB (ref 11–35)

## 2017-04-23 LAB — GAMMA GT: GGT: 721 U/L — AB (ref 7–51)

## 2017-04-23 MED ORDER — OMEPRAZOLE 20 MG PO CPDR: 20.0000 mg | DELAYED_RELEASE_CAPSULE | Freq: Two times a day (BID) | ORAL | 1 refills | Status: DC

## 2017-04-23 MED ORDER — CYCLOBENZAPRINE HCL 5 MG PO TABS: 5.0000 mg | ORAL_TABLET | Freq: Every day | ORAL | 0 refills | Status: DC

## 2017-04-23 NOTE — Progress Notes (Signed)
Patient presents to clinic today for follow-up. Patient with medical history significant for alcohol abuse, alcoholic cirrhosis, chronic nausea, anxiety/depression, narcotic overdose, among others. At last visit, she was encouraged to seek inpatient treatment for alcohol abuse and had agreed to this in the best interest of her health. Patient states that she had her husband call [facility] and [facility] and he felt they would not be beneficial to her. On questioning why she has not called herself, she states that her husband takes the lead in everything and she would not know what to ask them. Patient continued to drink -- states amount depends on the day -- usually 7-8 drinks on bad days. Is still dealing with chronic nausea that is episodic and accompanied by abdominal pain and swelling that resolves in a couple of days. Has not followed up with her Gastroenterologist as directed. States that she continues to drink because she is in pain. Is followed by Pain management, previously on Hydrocodone for pain relief along with Gabapentin. The Hydrocodone was stopped after patient presented to ER on 02/27/17 with opiate overdose. Denies fever, chills, change to urinary habits or bowel habits. Is feeling extremely fatigued daily as she is not sleeping well due to pain. Is requesting pain and sleep Rx. Is taking Fluoxetine and BuSpar as directed. States she has not noted much improvement. Wished she were still on her anxiety pills.    Past Medical History:  Diagnosis Date  . Alcoholic hepatitis   . Aortic atherosclerosis ([place])   . Arthritis    neck  . Carpal tunnel syndrome on both sides 02/2012  . Cigarette nicotine dependence   . Contact lens/glasses fitting    wears contacts or glasses  . Dental crowns present    x 2 upper front  . Elevated LFTs   . Fatty liver   . Fluid retention   . History of MRSA infection   . Hypertension   . Insomnia   . Migraines   . Osteoarthritis   .  Parasomnia   . Wears partial dentures    lower    Current Outpatient Prescriptions on File Prior to Visit  Medication Sig Dispense Refill  . busPIRone (BUSPAR) 10 MG tablet Take 1 tablet (10 mg total) by mouth 2 (two) times daily. 60 tablet 3  . cloNIDine (CATAPRES - DOSED IN MG/24 HR) 0.2 mg/24hr patch APPLY 1 PATCH AS NEEDED AS DIRECTED BY MD  0  . fluocinonide (LIDEX) 0.05 % external solution APPLY ON THE SKIN BID PRN  0  . FLUoxetine (PROZAC) 40 MG capsule Take 1 capsule (40 mg total) by mouth daily. 30 capsule 3  . gabapentin (NEURONTIN) 600 MG tablet Take 600 mg by mouth 3 (three) times daily as needed (pain).    . hydrochlorothiazide (HYDRODIURIL) 25 MG tablet Take 25 mg by mouth daily.    [name] loratadine (CLARITIN) 10 MG tablet Take 10 mg by mouth daily.    . Omega-3 Fatty Acids (FISH OIL) 1000 MG CAPS Take 1 capsule (1,000 mg total) by mouth daily. 30 capsule 0  . omeprazole (PRILOSEC) 20 MG capsule Take 20 mg by mouth 2 (two) times daily before a meal.    . potassium chloride SA (K-DUR,KLOR-CON) 20 MEQ tablet Take 1 tablet (20 mEq total) by mouth daily. 3 tablet 0  . HYDROcodone-acetaminophen (NORCO) 10-325 MG tablet Take 1 tablet by mouth 4 (four) times daily as needed for moderate pain.     No current facility-administered medications on  file prior to visit.     No Known Allergies  Family History  Problem Relation Age of Onset  . Diabetes Mother   . Hypertension Mother   . Colon polyps Sister   . Hypertension Sister   . Diabetes Sister   . Lung cancer Maternal Grandmother   . Colon cancer Neg Hx   . Esophageal cancer Neg Hx   . Pancreatic cancer Neg Hx   . Stomach cancer Neg Hx   . Liver disease Neg Hx     Social History   Social History  . Marital status: Married    Spouse name: N/A  . Number of children: N/A  . Years of education: N/A   Occupational History  . cleaning    Social History Main Topics  . Smoking status: Former Smoker    Packs/day: 0.50     Years: 10.00    Types: Cigarettes  . Smokeless tobacco: Never Used     Comment: now has patch 11/18/16  . Alcohol use Yes     Comment: 2 Mikes hard drinks; now has only occasional as of 11/18/16   . Drug use: No  . Sexual activity: No   Other Topics Concern  . None   Social History Narrative  . None   Review of Systems - See HPI.  All other ROS are negative.  BP 124/80   Pulse 74   Temp 98.2 F (36.8 C) (Oral)   Resp 14   Ht '5\' 2"'$  (1.575 m)   Wt 125 lb (56.7 kg)   SpO2 98%   BMI 22.86 kg/m   Physical Exam  Constitutional: She is oriented to person, place, and time and well-developed, well-nourished, and in no distress.  HENT:  Head: Normocephalic and atraumatic.  Eyes: Conjunctivae are normal.  Neck: Neck supple.  Cardiovascular: Normal rate, regular rhythm, normal heart sounds and intact distal pulses.   Pulmonary/Chest: Effort normal and breath sounds normal. No respiratory distress. She has no wheezes. She has no rales. She exhibits no tenderness.  Abdominal: Soft. Bowel sounds are normal. She exhibits no distension and no mass. There is generalized tenderness (chronic). There is no rebound and no guarding.  Neurological: She is alert and oriented to person, place, and time.  Skin: Skin is warm and dry. No rash noted.  Psychiatric: Affect normal.  Vitals reviewed.   Recent Results (from the past 2160 hour(s))  Urine rapid drug screen (hosp performed)     Status: Abnormal   Collection Time: 02/27/17  6:16 PM  Result Value Ref Range   Opiates POSITIVE (A) NONE DETECTED   Cocaine NONE DETECTED NONE DETECTED   Benzodiazepines POSITIVE (A) NONE DETECTED   Amphetamines NONE DETECTED NONE DETECTED   Tetrahydrocannabinol NONE DETECTED NONE DETECTED   Barbiturates NONE DETECTED NONE DETECTED    Comment:        DRUG SCREEN FOR MEDICAL PURPOSES ONLY.  IF CONFIRMATION IS NEEDED FOR ANY PURPOSE, NOTIFY LAB WITHIN 5 DAYS.        LOWEST DETECTABLE LIMITS FOR URINE DRUG  SCREEN Drug Class       Cutoff (ng/mL) Amphetamine      1000 Barbiturate      200 Benzodiazepine   809 Tricyclics       983 Opiates          300 Cocaine          300 THC              50  Magnesium     Status: None   Collection Time: 02/27/17  6:32 PM  Result Value Ref Range   Magnesium 2.1 1.7 - 2.4 mg/dL  Comprehensive metabolic panel     Status: Abnormal   Collection Time: 02/27/17  6:34 PM  Result Value Ref Range   Sodium 137 135 - 145 mmol/L   Potassium 2.9 (L) 3.5 - 5.1 mmol/L   Chloride 102 101 - 111 mmol/L   CO2 22 22 - 32 mmol/L   Glucose, Bld 148 (H) 65 - 99 mg/dL   BUN <5 (L) 6 - 20 mg/dL   Creatinine, Ser 0.45 0.44 - 1.00 mg/dL   Calcium 8.3 (L) 8.9 - 10.3 mg/dL   Total Protein 7.1 6.5 - 8.1 g/dL   Albumin 3.6 3.5 - 5.0 g/dL   AST 139 (H) 15 - 41 U/L   ALT 44 14 - 54 U/L   Alkaline Phosphatase 173 (H) 38 - 126 U/L   Total Bilirubin 0.4 0.3 - 1.2 mg/dL   GFR calc non Af Amer >60 >60 mL/min   GFR calc Af Amer >60 >60 mL/min    Comment: (NOTE) The eGFR has been calculated using the CKD EPI equation. This calculation has not been validated in all clinical situations. eGFR's persistently <60 mL/min signify possible Chronic Kidney Disease.    Anion gap 13 5 - 15  Salicylate level     Status: None   Collection Time: 02/27/17  6:34 PM  Result Value Ref Range   Salicylate Lvl <9.7 2.8 - 30.0 mg/dL  Acetaminophen level     Status: Abnormal   Collection Time: 02/27/17  6:34 PM  Result Value Ref Range   Acetaminophen (Tylenol), Serum <10 (L) 10 - 30 ug/mL    Comment:        THERAPEUTIC CONCENTRATIONS VARY SIGNIFICANTLY. A RANGE OF 10-30 ug/mL MAY BE AN EFFECTIVE CONCENTRATION FOR MANY PATIENTS. HOWEVER, SOME ARE BEST TREATED AT CONCENTRATIONS OUTSIDE THIS RANGE. ACETAMINOPHEN CONCENTRATIONS >150 ug/mL AT 4 HOURS AFTER INGESTION AND >50 ug/mL AT 12 HOURS AFTER INGESTION ARE OFTEN ASSOCIATED WITH TOXIC REACTIONS.   Ethanol     Status: Abnormal    Collection Time: 02/27/17  6:34 PM  Result Value Ref Range   Alcohol, Ethyl (B) 323 (HH) <5 mg/dL    Comment:        LOWEST DETECTABLE LIMIT FOR SERUM ALCOHOL IS 5 mg/dL FOR MEDICAL PURPOSES ONLY CRITICAL RESULT CALLED TO, READ BACK BY AND VERIFIED WITH: COGGINS,I RN [phone] C[place],N   CBC WITH DIFFERENTIAL     Status: Abnormal   Collection Time: 02/27/17  6:34 PM  Result Value Ref Range   WBC 8.4 4.0 - 10.5 K/uL   RBC 3.61 (L) 3.87 - 5.11 MIL/uL   Hemoglobin 13.4 12.0 - 15.0 g/dL   HCT 38.9 36.0 - 46.0 %   MCV 107.8 (H) 78.0 - 100.0 fL   MCH 37.1 (H) 26.0 - 34.0 pg   MCHC 34.4 30.0 - 36.0 g/dL   RDW 14.8 11.5 - 15.5 %   Platelets 180 150 - 400 K/uL   Neutrophils Relative % 74 %   Neutro Abs 6.3 1.7 - 7.7 K/uL   Lymphocytes Relative 19 %   Lymphs Abs 1.6 0.7 - 4.0 K/uL   Monocytes Relative 6 %   Monocytes Absolute 0.5 0.1 - 1.0 K/uL   Eosinophils Relative 1 %   Eosinophils Absolute 0.1 0.0 - 0.7 K/uL   Basophils Relative 0 %  Basophils Absolute 0.0 0.0 - 0.1 K/uL  Ammonia     Status: Abnormal   Collection Time: 02/27/17  6:34 PM  Result Value Ref Range   Ammonia 42 (H) 9 - 35 umol/L  CBG monitoring, ED     Status: Abnormal   Collection Time: 02/27/17  6:43 PM  Result Value Ref Range   Glucose-Capillary 132 (H) 65 - 99 mg/dL  Basic metabolic panel     Status: Abnormal   Collection Time: 03/12/17 11:46 AM  Result Value Ref Range   Sodium 131 (L) 135 - 145 mEq/L   Potassium 3.5 3.5 - 5.1 mEq/L   Chloride 92 (L) 96 - 112 mEq/L   CO2 30 19 - 32 mEq/L   Glucose, Bld 187 (H) 70 - 99 mg/dL   BUN 6 6 - 23 mg/dL   Creatinine, Ser 0.56 0.40 - 1.20 mg/dL   Calcium 9.3 8.4 - 10.5 mg/dL   GFR 121.44 >60.00 mL/min    Assessment/Plan: 1. Alcoholic cirrhosis of liver without ascites ([place]) Repeat labs today as she has had not follow-up with GI as recommended. She will schedule with Dr. Celesta Aver office. Not sure what I or specialist will be able to do for her if she  cannot see the severity of her health conditions and need to stop alcohol cessation. Start Prilosec BID to help with GERD. GERD diet reviewed. Ginger for nausea. - Comp Met (CMET) - Gamma GT - CBC w/Diff - Ammonia - omeprazole (PRILOSEC) 20 MG capsule; Take 1 capsule (20 mg total) by mouth 2 (two) times daily before a meal.  Dispense: 60 capsule; Refill: 1  2. Anxiety and depression Exacerbated by pain. Will alter Gabapentin dose for pain and to help mood based on CMP. Continue current regimen as options are significantly limited due to current health issues, alcohol abuse and history of opiate overdose. Will work on getting her in with specialist and rehabilitation center.   3. Substance abuse ([place]) Alcohol. History of opiate abuse. Patient is not taking any personal responsibility to try and seek help. Is allowing her husband to dictate if a facility can help her. Discussed with her that I want her to call personally. I will also reach out to local areas as well, but both centers she has been referred to should be able to help her. If I reach out and patient does not seek help, I will not be able to supervise the neglect of her own health.   Leeanne Rio, PA-C

## 2017-04-23 NOTE — Patient Instructions (Signed)
Please go to the lab for blood work. I will call you with your results. Please stay well-hydrated and get plenty of rest. I will be calling some local resources for you to help with drinking.   If we cannot get this under control I am worried about your future health and life expectancy.   Please call Dr. Celesta Aver office to schedule a follow-up appointment.   Take Flexeril as directed at night to help with muscle pain and to help with sleep. We will alter your Gabapentin for pain based on kidney function.

## 2017-04-23 NOTE — Progress Notes (Signed)
Pre visit review using our clinic review tool, if applicable. No additional management support is needed unless otherwise documented below in the visit note. 

## 2017-04-26 ENCOUNTER — Telehealth: Payer: Self-pay | Admitting: Physician Assistant

## 2017-04-26 MED ORDER — GABAPENTIN 100 MG PO CAPS: 100.0000 mg | ORAL_CAPSULE | Freq: Every day | ORAL | 0 refills | Status: DC

## 2017-04-26 MED ORDER — GABAPENTIN 600 MG PO TABS: 600.0000 mg | ORAL_TABLET | Freq: Three times a day (TID) | ORAL | 0 refills | Status: DC

## 2017-04-26 NOTE — Telephone Encounter (Signed)
Patient advised on how to take medications.  She repeated back to me correctly and stated verbal understanding.

## 2017-04-26 NOTE — Telephone Encounter (Signed)
If she has been taking regularly without being out of medication, ok to refill at current signature -- 600 mg TID. Also send in 100 mg capsule Quantity 30. She is to take  600 mg AM, 600 mg noon and 700 mg PM.

## 2017-04-26 NOTE — Telephone Encounter (Signed)
Patient states that she no longer sees pain management. She states that she was told that gabapentin would be altered based on the lab results (she said this is on her visit summary).  She states that she is in a lot of pain and really needs help.   She is not opposed to finding another pain specialist, but she states that she needs this in the meantime.

## 2017-04-26 NOTE — Telephone Encounter (Signed)
Pt asking if Einar Pheasant is going to call in her gabapentin for her, pt states that she wants this is a capsule form, [facility] in [place].

## 2017-04-26 NOTE — Telephone Encounter (Signed)
This is prescribed by her Pain specialist.

## 2017-04-27 ENCOUNTER — Other Ambulatory Visit: Payer: Self-pay | Admitting: Emergency Medicine

## 2017-04-28 ENCOUNTER — Telehealth: Payer: Self-pay | Admitting: Internal Medicine

## 2017-04-28 ENCOUNTER — Telehealth: Payer: Self-pay | Admitting: Emergency Medicine

## 2017-04-28 MED ORDER — GABAPENTIN 300 MG PO CAPS: ORAL_CAPSULE | ORAL | 3 refills | Status: DC

## 2017-04-28 NOTE — Telephone Encounter (Signed)
Left message on machine to call back  

## 2017-04-28 NOTE — Telephone Encounter (Signed)
Spoke with patient and advised that the pharmacy does not carry Gabapentin 600 mg in capsule. Patient states capsules work better. She can't get the 600 mg refilled until 04/29/17. Patient has the 100 mg capsules to take at bedtime along with the 600 mg. She has been out of the Gabapentin.  She wanted the 300 mg capsules and take 2 capsules tid day. She was agreeable with the many pills.  Please advise

## 2017-04-28 NOTE — Telephone Encounter (Signed)
I am not understanding why she is out of medication early. We can restart her on the 600 mg TID. Ok to send in 300 mg capsules -- take 2 by mouth TID. Would hold off on her taking the 100 mg in addition at night, until she has been on previous dose for a couple of weeks.

## 2017-04-28 NOTE — Telephone Encounter (Signed)
Patient called and said that she has not been out of medication except for 1-2 doses today.  She is aware on how to take the medication, and that the new dose is sent.    Advised patient on the importance of taking medication ONLY as prescribed.  Advised that if patient begins a cycle of taking medication more than prescribed and she continues to run out early then she runs the risk of additional health issues, as well as providers no longer providing medication.   Patient stated verbal understanding and insisted that this would not happen again.

## 2017-04-29 NOTE — Telephone Encounter (Signed)
Patient scheduled to see Ellouise Newer, PA on 05/05/17 1:45

## 2017-05-05 ENCOUNTER — Ambulatory Visit (INDEPENDENT_AMBULATORY_CARE_PROVIDER_SITE_OTHER): Payer: BLUE CROSS/BLUE SHIELD | Admitting: Physician Assistant

## 2017-05-05 ENCOUNTER — Encounter: Payer: Self-pay | Admitting: Physician Assistant

## 2017-05-05 VITALS — BP 126/94 | HR 80 | Ht 62.5 in | Wt 127.2 lb

## 2017-05-05 DIAGNOSIS — K219 Gastro-esophageal reflux disease without esophagitis: Secondary | ICD-10-CM

## 2017-05-05 DIAGNOSIS — R140 Abdominal distension (gaseous): Secondary | ICD-10-CM

## 2017-05-05 DIAGNOSIS — R112 Nausea with vomiting, unspecified: Secondary | ICD-10-CM | POA: Diagnosis not present

## 2017-05-05 DIAGNOSIS — R945 Abnormal results of liver function studies: Secondary | ICD-10-CM

## 2017-05-05 DIAGNOSIS — R7989 Other specified abnormal findings of blood chemistry: Secondary | ICD-10-CM

## 2017-05-05 NOTE — Progress Notes (Signed)
Chief Complaint: Elevated LFT's, Vomiting, Abdominal Distension  HPI:  Amy Villa is a 51 year old Caucasian female with a past medical history of alcohol abuse and suspected associated alcoholic cirrhosis, who was referred to me by [name], PA-C for a complaint of continually elevated LFTs, vomiting and abdominal distention .      Patient's last colonoscopy was 09/20/2012 by Dr. Carlean Purl is pertinent for 2 polyps which were benign. Repeat was recommended in 10 years. Patient has previously been followed in our clinic for elevated liver enzymes thought related to her alcohol abuse on 12/09/16 . Hepatitis serologies have been negative in the past. Patient has had imaging which suggests fatty liver. Most recent abdominal imaging was a right upper quadrant ultrasound 01/09/17 which showed no acute findings, no evidence of cholecystitis or gallstones and no bile duct dilation and there was nodular appearance of liver and probable cirrhosis. Most recent labs show a CMP with a minimally decreased potassium at 3.4, alkaline phosphatase increased at 217 and AST increased at 92. Other liver enzymes are normal. Two months ago patient's Alkaline phosphatase ias 173 and AST 139. Patient's GGT was elevated on 04/23/17 at 721. CBC was normal. Ammonia level was continually elevated, 2 months ago 42 and 12 days ago 53.    Today, the patient presents to clinic and her main complaint is one of what sounds like cyclical vomiting. The patient tells me that she will go 3-5 days with no symptoms and then will wake up the next morning and have vomiting and dry heaves and has no appetite. This may last anywhere from 3-5 days and resolves on its own. For the past week and a half she has had no symptoms. Patient tells me at one point she was sent to the ER for IV fluids due to this. Patient has continued her Omeprazole 20 mg twice a day. She has been on this for years. In addition, a new medication was added, Flexeril,  which is supposed to help her sleep. The patient tells me that she continues to drink but has switched to just Hard lemonade. Patient tells me that she will have 2-3 Hard Lemonades a night or sometimes will not have any because "I'm feeling bad".   Patient does tell me she is concerned in regards to an increasing abdominal distention she has noticed over the past few months. She has also started to note some petechiae on her skin in various areas and easy bruising.   Patient's social history is positive for having a husband who drinks gin and tonic up to a bottle of gin per night, he has been doing this for years. Patient tells me that when he is drunk, he is not a good drunk and can be  " sloppy". She tells me she has to put him to sleep on most nights.   Patient denies fever, chills, blood in her stool, melena, weight loss, anorexia, change in bowel habits, heartburn or reflux.  Past Medical History:  Diagnosis Date  . Alcoholic hepatitis   . Aortic atherosclerosis ([place])   . Arthritis    neck  . Carpal tunnel syndrome on both sides 02/2012  . Cigarette nicotine dependence   . Contact lens/glasses fitting    wears contacts or glasses  . Dental crowns present    x 2 upper front  . Elevated LFTs   . Fatty liver   . Fluid retention   . History of MRSA infection   . Hypertension   .  Insomnia   . Migraines   . Osteoarthritis   . Parasomnia   . Wears partial dentures    lower    Past Surgical History:  Procedure Laterality Date  . CARPAL TUNNEL RELEASE  03/17/2012   Procedure: CARPAL TUNNEL RELEASE;  Surgeon: Roseanne Kaufman, MD;  Location: [facility];  Service: Orthopedics;  Laterality: Bilateral;  left limited open carpal tunnel release. right carpal tunnel injection with 1cc of celestone and 2cc of marcaine.25%  . CARPAL TUNNEL RELEASE  06/30/2012   Procedure: CARPAL TUNNEL RELEASE;  Surgeon: Roseanne Kaufman, MD;  Location: [facility];  Service: Orthopedics;  Laterality:  Right;  RIGHT LIMITED OPEN CARPAL TUNNEL RELEASE  . CARPOMETACARPAL ([facility]) FUSION OF THUMB Left 06/23/2013   Procedure: LEFT CARPOMETACARPEL ([place]) FUSION OF THUMB WITH AUTOGRAFT FROM RADIUS AND REPAIR AS NECESSARY;  Surgeon: Roseanne Kaufman, MD;  Location: [facility];  Service: Orthopedics;  Laterality: Left;  . HARDWARE REMOVAL Left 07/10/2013   Procedure: HARDWARE REMOVAL;  Surgeon: Roseanne Kaufman, MD;  Location: [facility];  Service: Orthopedics;  Laterality: Left;  . INCISION AND DRAINAGE OF WOUND Left 07/10/2013   Procedure: IRRIGATION AND DEBRIDEMENT WOUND left wrist  ;  Surgeon: Roseanne Kaufman, MD;  Location: [facility];  Service: Orthopedics;  Laterality: Left;  . LAPAROSCOPIC VAGINAL HYSTERECTOMY  02/24/2010  . LAPAROSCOPY  06/2010   for adhesions  . LEEP     x 2  . LUMBAR DISC SURGERY  03/23/2003   left L5-S1 discectomy with microdissection  . LUMBAR DISC SURGERY  05/23/2003   redo discectomy L5-S1 with microdissection    Current Outpatient Prescriptions  Medication Sig Dispense Refill  . busPIRone (BUSPAR) 10 MG tablet Take 1 tablet (10 mg total) by mouth 2 (two) times daily. 60 tablet 3  . cyclobenzaprine (FLEXERIL) 5 MG tablet Take 1 tablet (5 mg total) by mouth at bedtime. 30 tablet 0  . fluocinonide (LIDEX) 0.05 % external solution APPLY ON THE SKIN BID PRN  0  . FLUoxetine (PROZAC) 40 MG capsule Take 1 capsule (40 mg total) by mouth daily. 30 capsule 3  . gabapentin (NEURONTIN) 300 MG capsule Take 2 capsules by mouth three times per day 180 capsule 3  . hydrochlorothiazide (HYDRODIURIL) 25 MG tablet Take 25 mg by mouth daily.    [name] loratadine (CLARITIN) 10 MG tablet Take 10 mg by mouth daily.    . Omega-3 Fatty Acids (FISH OIL) 1000 MG CAPS Take 1 capsule (1,000 mg total) by mouth daily. 30 capsule 0  . omeprazole (PRILOSEC) 20 MG capsule Take 1 capsule (20 mg total) by mouth 2 (two) times daily before a meal. 60 capsule 1  . potassium chloride SA (K-DUR,KLOR-CON) 20 MEQ  tablet Take 1 tablet (20 mEq total) by mouth daily. 3 tablet 0   No current facility-administered medications for this visit.     Allergies as of 05/05/2017  . (No Known Allergies)    Family History  Problem Relation Age of Onset  . Diabetes Mother   . Hypertension Mother   . Colon polyps Sister   . Hypertension Sister   . Diabetes Sister   . Lung cancer Maternal Grandmother   . Colon cancer Neg Hx   . Esophageal cancer Neg Hx   . Pancreatic cancer Neg Hx   . Stomach cancer Neg Hx   . Liver disease Neg Hx     Social History   Social History  . Marital status: Married    Spouse  name: N/A  . Number of children: N/A  . Years of education: N/A   Occupational History  . cleaning    Social History Main Topics  . Smoking status: Former Smoker    Packs/day: 0.50    Years: 10.00    Types: Cigarettes  . Smokeless tobacco: Never Used     Comment: now has patch 11/18/16  . Alcohol use Yes     Comment: 2 Mikes hard drinks; now has only occasional as of 11/18/16   . Drug use: No  . Sexual activity: No   Other Topics Concern  . Not on file   Social History Narrative  . No narrative on file    Review of Systems:    Constitutional: No weight loss, fever or chills Cardiovascular: No chest pain   Respiratory: No SOB  Gastrointestinal: See HPI and otherwise negative   Physical Exam:  Vital signs: BP (!) 126/94   Pulse 80   Ht 5' 2.5" (1.588 m)   Wt 127 lb 3.2 oz (57.7 kg)   BMI 22.89 kg/m   Constitutional:   Pleasant Caucasian female appears to be in NAD, Well developed, Well nourished, alert and cooperative Head:  Normocephalic and atraumatic. Eyes:   PEERL, EOMI. No icterus. Conjunctiva pink. Ears:  Normal auditory acuity. Neck:  Supple Throat: Oral cavity and pharynx without inflammation, swelling or lesion.  Respiratory: Respirations even and unlabored. Lungs clear to auscultation bilaterally.   No wheezes, crackles, or rhonchi.  Cardiovascular: Normal S1, S2.  No MRG. Regular rate and rhythm. No peripheral edema, cyanosis or pallor.  Gastrointestinal:  Soft,mild distension, mild generalized ttp, No rebound or guarding. Normal bowel sounds. No appreciable masses or hepatomegaly. Rectal:  Not performed.  Msk:  Symmetrical without gross deformities. Without edema, no deformity or joint abnormality.  Neurologic:  Alert and  oriented x4;  grossly normal neurologically. No asterixis Skin:   Dry and intact without significant lesions or rashes. Psychiatric: Demonstrates good judgement and reason without abnormal affect or behaviors.  RELEVANT LABS AND IMAGING: CBC    Component Value Date/Time   WBC 9.9 04/23/2017 1513   RBC 3.37 (L) 04/23/2017 1513   HGB 12.6 04/23/2017 1513   HCT 37.4 04/23/2017 1513   PLT 241.0 04/23/2017 1513   MCV 111.0 (H) 04/23/2017 1513   MCH 37.1 (H) 02/27/2017 1834   MCHC 33.8 04/23/2017 1513   RDW 13.8 04/23/2017 1513   LYMPHSABS 1.4 04/23/2017 1513   MONOABS 0.7 04/23/2017 1513   EOSABS 0.1 04/23/2017 1513   BASOSABS 0.0 04/23/2017 1513    CMP     Component Value Date/Time   NA 138 04/23/2017 1513   K 3.4 (L) 04/23/2017 1513   CL 95 (L) 04/23/2017 1513   CO2 36 (H) 04/23/2017 1513   GLUCOSE 93 04/23/2017 1513   BUN 4 (L) 04/23/2017 1513   CREATININE 0.54 04/23/2017 1513   CALCIUM 9.1 04/23/2017 1513   PROT 6.7 04/23/2017 1513   ALBUMIN 3.6 04/23/2017 1513   AST 92 (H) 04/23/2017 1513   ALT 31 04/23/2017 1513   ALKPHOS 217 (H) 04/23/2017 1513   BILITOT 0.6 04/23/2017 1513   GFRNONAA >60 02/27/2017 1834   GFRAA >60 02/27/2017 1834   CLINICAL DATA:  Vomiting for 1 year, intermittent.  Additional history of hypertension, elevated liver function tests, fatty liver, alcoholic hepatitis.  EXAM: ULTRASOUND ABDOMEN LIMITED RIGHT UPPER QUADRANT  COMPARISON:  None.  FINDINGS: Gallbladder:  Gallbladder is moderately distended but otherwise unremarkable. No gallstones.  No gallbladder wall  thickening, pericholecystic fluid or other signs of acute cholecystitis. No sonographic Murphy's sign during the exam.  Common bile duct:  Diameter: Normal with measurements of 3-6 mm.  Liver:  Liver is diffusely echogenic, consistent with the fatty infiltration, also demonstrated on earlier CT abdomen of 11/27/2016. Peripheral liver contours are diffusely nodular suggest underlying cirrhosis as well.  No focal liver mass or lesion is identified although characterization is limited by the underlying fatty infiltration and probable cirrhosis.  Main portal vein is shown to be patent with appropriate hepatopetal direction of blood flow.  IMPRESSION: 1. No acute findings. No evidence of cholecystitis. No gallstones. No bile duct dilatation. 2. Fatty infiltration of the liver and probable cirrhosis.   Electronically Signed   By: Franki Cabot M.D.   On: 01/09/2017 18:14  Assessment: 1. Elevated LFTs: Again these are likely elevated due to alcohol abuse, discussed this with the patient 2. Increasing abdominal distention: Patient has noted increasing abdominal distention over the past couple of months; Consider relation to fluid/ascites from cirrhosis 3. Cyclical vomiting: Patient describes every 3-5 days having 2-3 days of vomiting and feeling "sick" with a decrease in appetite; consider relation to alcohol use vs GERD versus H. pylori versus other 4. GERD: Controlled on Omeprazole 20 mg twice a day, question whether or not this is contributing to above  Plan: 1. Discussed with the patient that again it appears that she has cirrhosis, likely related to her alcohol use, with petechiae and increasing abdominal distention this has likely advanced. Recommend repeat ultrasound of the abdomen today to decipher any changes over the past 4 months especially with the patient's continued alcohol abuse. 2. Patient may benefit from iron studies and vitamin B12 studies in the near  future 3. Scheduled patient for an EGD for further evaluation of cyclical vomiting and for variceal screening. Discussed risks, benefits, limitations and alternatives the patient agrees to proceed. 4. Patient to continue her Omeprazole 20 mg twice a day, 30-60 minutes before breakfast and dinner 5. Patient to return to clinic with Dr. Carlean Purl per his recommendations after time of procedure. 6. Again reiterated alcohol abstinence and the importance of this.  Amy Newer, PA-C [facility] 05/05/2017, 2:33 PM  Cc: [name], PA-C

## 2017-05-05 NOTE — Patient Instructions (Addendum)
Continue Omeprazole 20 mg twice a day. 30-60 minutes before lunch and dinner.  You have been scheduled for an endoscopy. Please follow written instructions given to you at your visit today. If you use inhalers (even only as needed), please bring them with you on the day of your procedure. Your physician has requested that you go to www.startemmi.com and enter the access code given to you at your visit today. This web site gives a general overview about your procedure. However, you should still follow specific instructions given to you by our office regarding your preparation for the procedure.  You have been scheduled for an abdominal ultrasound at [facility] (1st floor of hospital) on 05/10/17 at 10:30 am. Please arrive 15 minutes prior to your appointment for registration. Make certain not to have anything to eat or drink 6 hours prior to your appointment. Should you need to reschedule your appointment, please contact radiology at [phone]. This test typically takes about 30 minutes to perform.

## 2017-05-10 ENCOUNTER — Ambulatory Visit (HOSPITAL_COMMUNITY)
Admission: RE | Admit: 2017-05-10 | Discharge: 2017-05-10 | Disposition: A | Discharge status: Home / Self Care | Payer: BLUE CROSS/BLUE SHIELD | Source: Ambulatory Visit | Attending: Physician Assistant | Admitting: Physician Assistant

## 2017-05-10 DIAGNOSIS — R112 Nausea with vomiting, unspecified: Secondary | ICD-10-CM | POA: Diagnosis present

## 2017-05-10 DIAGNOSIS — R160 Hepatomegaly, not elsewhere classified: Secondary | ICD-10-CM | POA: Diagnosis not present

## 2017-05-10 DIAGNOSIS — R7989 Other specified abnormal findings of blood chemistry: Secondary | ICD-10-CM

## 2017-05-10 DIAGNOSIS — R945 Abnormal results of liver function studies: Secondary | ICD-10-CM | POA: Diagnosis present

## 2017-05-14 ENCOUNTER — Encounter: Payer: Self-pay | Admitting: Internal Medicine

## 2017-05-20 HISTORY — PX: ESOPHAGOGASTRODUODENOSCOPY: SHX1529

## 2017-05-24 ENCOUNTER — Other Ambulatory Visit: Payer: Self-pay | Admitting: Physician Assistant

## 2017-05-28 ENCOUNTER — Ambulatory Visit (AMBULATORY_SURGERY_CENTER): Payer: BLUE CROSS/BLUE SHIELD | Admitting: Internal Medicine

## 2017-05-28 ENCOUNTER — Other Ambulatory Visit: Payer: Self-pay

## 2017-05-28 ENCOUNTER — Encounter: Payer: Self-pay | Admitting: Internal Medicine

## 2017-05-28 VITALS — BP 125/80 | HR 84 | Temp 97.7°F | Resp 12 | Ht 62.5 in | Wt 127.0 lb

## 2017-05-28 DIAGNOSIS — K219 Gastro-esophageal reflux disease without esophagitis: Secondary | ICD-10-CM

## 2017-05-28 DIAGNOSIS — K2950 Unspecified chronic gastritis without bleeding: Secondary | ICD-10-CM | POA: Diagnosis not present

## 2017-05-28 DIAGNOSIS — R112 Nausea with vomiting, unspecified: Secondary | ICD-10-CM | POA: Diagnosis not present

## 2017-05-28 DIAGNOSIS — K2990 Gastroduodenitis, unspecified, without bleeding: Secondary | ICD-10-CM | POA: Diagnosis not present

## 2017-05-28 DIAGNOSIS — K2210 Ulcer of esophagus without bleeding: Secondary | ICD-10-CM | POA: Diagnosis not present

## 2017-05-28 DIAGNOSIS — K2100 Gastro-esophageal reflux disease with esophagitis, without bleeding: Secondary | ICD-10-CM

## 2017-05-28 DIAGNOSIS — K2970 Gastritis, unspecified, without bleeding: Secondary | ICD-10-CM | POA: Diagnosis not present

## 2017-05-28 MED ORDER — SODIUM CHLORIDE 0.9 % IV SOLN: 500.0000 mL | INTRAVENOUS | Status: DC

## 2017-05-28 MED ORDER — PANTOPRAZOLE SODIUM 40 MG PO TBEC: 40.0000 mg | DELAYED_RELEASE_TABLET | Freq: Every day | ORAL | 3 refills | Status: DC

## 2017-05-28 MED ORDER — ONDANSETRON 4 MG PO TBDP: 4.0000 mg | ORAL_TABLET | Freq: Three times a day (TID) | ORAL | 2 refills | Status: DC | PRN

## 2017-05-28 NOTE — Op Note (Signed)
[place] Patient Name: Amy Villa Procedure Date: 05/28/2017 10:30 AM MRN: [id number] Endoscopist: [name] , MD Age: 51 Referring MD:  Date of Birth: [birth year] Gender: Female Account #: [id number] Procedure:                Upper GI endoscopy Indications:              Nausea with vomiting, Persistent vomiting Medicines:                Propofol per Anesthesia, Monitored Anesthesia Care Procedure:                Pre-Anesthesia Assessment:                           - Prior to the procedure, a History and Physical                            was performed, and patient medications and                            allergies were reviewed. The patient's tolerance of                            previous anesthesia was also reviewed. The risks                            and benefits of the procedure and the sedation                            options and risks were discussed with the patient.                            All questions were answered, and informed consent                            was obtained. Prior Anticoagulants: The patient has                            taken no previous anticoagulant or antiplatelet                            agents. ASA Grade Assessment: II - A patient with                            mild systemic disease. After reviewing the risks                            and benefits, the patient was deemed in                            satisfactory condition to undergo the procedure.                           After obtaining informed consent, the endoscope was  passed under direct vision. Throughout the                            procedure, the patient's blood pressure, pulse, and                            oxygen saturations were monitored continuously. The                            Endoscope was introduced through the mouth, and                            advanced to the second part of duodenum. The upper                 GI endoscopy was accomplished without difficulty.                            The patient tolerated the procedure well. Scope In: Scope Out: Findings:                 LA Grade B (one or more mucosal breaks greater than                            5 mm, not extending between the tops of two mucosal                            folds) esophagitis with no bleeding was found in                            the distal esophagus.                           Patchy moderately erythematous mucosa without                            bleeding was found in the gastric antrum. Biopsies                            were taken with a cold forceps for histology.                            Verification of patient identification for the                            specimen was done. Estimated blood loss was minimal.                           A small hiatal hernia was present.                           Mild portal hypertensive gastropathy was found in                            the cardia, in the gastric fundus and in  the                            gastric body.                           The cardia and gastric fundus were normal on                            retroflexion.                           The exam was otherwise without abnormality. Complications:            No immediate complications. Estimated Blood Loss:     Estimated blood loss was minimal. Impression:               - LA Grade B reflux esophagitis.                           - Erythematous mucosa in the antrum. Biopsied.                           - Small hiatal hernia.                           - Portal hypertensive gastropathy. Mild - suspected                            No varices and NL PLTs so may not be cirrhotic yet                           - The examination was otherwise normal. Recommendation:           - Patient has a contact number available for                            emergencies. The signs and symptoms of potential                             delayed complications were discussed with the                            patient. Return to normal activities tomorrow.                            Written discharge instructions were provided to the                            patient.                           - Resume previous diet.                           - Continue present medications.                           -  Await pathology results.                           - 1) STop omeprazole                           2) New rx pantoprazole                           3) ondansetron prn                           4) Need to stop EtOH                           5) will call results and follow-up [name], MD 05/28/2017 11:01:07 AM This report has been signed electronically.

## 2017-05-28 NOTE — Progress Notes (Signed)
Pt. Reports no change in her medical or surgical history since her pre-visit 05/05/2017.

## 2017-05-28 NOTE — Progress Notes (Signed)
Called to room to assist during endoscopic procedure.  Patient ID and intended procedure confirmed with present staff. Received instructions for my participation in the procedure from the performing physician.  

## 2017-05-28 NOTE — Progress Notes (Signed)
  [facility] Post-op Note  Patient: Amy Villa  Procedure(s) Performed: endoscopy  Patient Location: [facility] - Recovery Area  Anesthesia Type: Deep Sedation/Propofol  Level of Consciousness: sedated and patient cooperative  Airway and Oxygen Therapy: Patient Spontanous Breathing  Post-op Pain: none  Post-op Assessment:  Post-op Vital signs reviewed, Patient's Cardiovascular Status Stable, Respiratory Function Stable, Patent Airway, No signs of Nausea or vomiting and Pain level controlled  Post-op Vital Signs: Reviewed and stable  Complications: No apparent anesthesia complications  Amy Villa E [name] 10:54 AM

## 2017-05-28 NOTE — Patient Instructions (Addendum)
The stomach and esophagus show inflammation - could be an infection but I think probably alcohol causing stomach problems and the vomiting is causing damage to the esophagus.  I am changing medications as follow:  1) Stop omeprazole 2) start pantoprazole 40 mg before breakfast - prescription sent 3) try ondansetron as needed for nausea and vomiting - prescription sent  4) The most important thing you can do is STOP DRINKING ALCOHOL  We will call with results and plans.  I appreciate the opportunity to care for you. [name], MD, [facility]  ** Handouts given on Hiatal Hernia, GERD, Gastritis, and Esophagitis **   YOU HAD AN ENDOSCOPIC PROCEDURE TODAY AT [place] [facility]:   Refer to the procedure report that was given to you for any specific questions about what was found during the examination.  If the procedure report does not answer your questions, please call your gastroenterologist to clarify.  If you requested that your care partner not be given the details of your procedure findings, then the procedure report has been included in a sealed envelope for you to review at your convenience later.  YOU SHOULD EXPECT: Some feelings of bloating in the abdomen. Passage of more gas than usual.  Walking can help get rid of the air that was put into your GI tract during the procedure and reduce the bloating. If you had a lower endoscopy (such as a colonoscopy or flexible sigmoidoscopy) you may notice spotting of blood in your stool or on the toilet paper. If you underwent a bowel prep for your procedure, you may not have a normal bowel movement for a few days.  Please Note:  You might notice some irritation and congestion in your nose or some drainage.  This is from the oxygen used during your procedure.  There is no need for concern and it should clear up in a day or so.  SYMPTOMS TO REPORT IMMEDIATELY:   Following upper endoscopy (EGD)  Vomiting of blood or coffee ground  material  New chest pain or pain under the shoulder blades  Painful or persistently difficult swallowing  New shortness of breath  Fever of 100F or higher  Black, tarry-looking stools  For urgent or emergent issues, a gastroenterologist can be reached at any hour by calling [phone].   DIET:  We do recommend a small meal at first, but then you may proceed to your regular diet.  Drink plenty of fluids but you should avoid alcoholic beverages for 24 hours.  ACTIVITY:  You should plan to take it easy for the rest of today and you should NOT DRIVE or use heavy machinery until tomorrow (because of the sedation medicines used during the test).    FOLLOW UP: Our staff will call the number listed on your records the next business day following your procedure to check on you and address any questions or concerns that you may have regarding the information given to you following your procedure. If we do not reach you, we will leave a message.  However, if you are feeling well and you are not experiencing any problems, there is no need to return our call.  We will assume that you have returned to your regular daily activities without incident.  If any biopsies were taken you will be contacted by phone or by letter within the next 1-3 weeks.  Please call us at [phone] if you have not heard about the biopsies in 3 weeks.  SIGNATURES/CONFIDENTIALITY: You and/or your care partner have signed paperwork which will be entered into your electronic medical record.  These signatures attest to the fact that that the information above on your After Visit Summary has been reviewed and is understood.  Full responsibility of the confidentiality of this discharge information lies with you and/or your care-partner.

## 2017-05-29 ENCOUNTER — Other Ambulatory Visit: Payer: Self-pay | Admitting: Physician Assistant

## 2017-05-31 ENCOUNTER — Telehealth: Payer: Self-pay

## 2017-05-31 NOTE — Telephone Encounter (Signed)
Called [phone] and left a messaged we tried to reach pt for a follow up call. maw

## 2017-05-31 NOTE — Telephone Encounter (Signed)
Called [phone] and left a messaged we tried to reach pt for a follow up call. maw

## 2017-06-02 NOTE — Progress Notes (Signed)
Call patient from office: 1) biopsies show mild inflammation - no infection, no cancer changes 2) Hope she is doing better on new medication regimen and is avoiding alcohol] 3) Please get a sx update re: nausea and vomiting 4) Needs a visit w/ me for January (next avail) 5) no recall

## 2017-06-08 NOTE — Progress Notes (Signed)
A small hiatal hernia she has is not an issue for her if that is what she is asking about.  I reviewed her chart and I do not see any evidence for hernia otherwise.  If she is swelling significantly I recommend she see her PCP.

## 2017-06-23 ENCOUNTER — Other Ambulatory Visit: Payer: Self-pay | Admitting: Physician Assistant

## 2017-06-24 NOTE — Telephone Encounter (Signed)
Please advise of refill of medication

## 2017-07-13 ENCOUNTER — Other Ambulatory Visit: Payer: Self-pay | Admitting: Physician Assistant

## 2017-07-13 DIAGNOSIS — K7030 Alcoholic cirrhosis of liver without ascites: Secondary | ICD-10-CM

## 2017-07-15 NOTE — Telephone Encounter (Signed)
Refill x 6 mos 

## 2017-07-15 NOTE — Telephone Encounter (Signed)
Patient was changed to pantoprazole 05/28/2017 so doesn't need this rx.

## 2017-07-20 HISTORY — PX: ANKLE ARTHROSCOPY WITH RECONSTRUCTION: SHX5583

## 2017-07-23 ENCOUNTER — Other Ambulatory Visit: Payer: Self-pay

## 2017-07-23 ENCOUNTER — Emergency Department (HOSPITAL_BASED_OUTPATIENT_CLINIC_OR_DEPARTMENT_OTHER): Payer: BLUE CROSS/BLUE SHIELD

## 2017-07-23 ENCOUNTER — Emergency Department (HOSPITAL_BASED_OUTPATIENT_CLINIC_OR_DEPARTMENT_OTHER)
Admission: EM | Admit: 2017-07-23 | Discharge: 2017-07-23 | Disposition: A | Discharge status: Home / Self Care | Payer: BLUE CROSS/BLUE SHIELD | Attending: Emergency Medicine | Admitting: Emergency Medicine

## 2017-07-23 ENCOUNTER — Encounter (HOSPITAL_BASED_OUTPATIENT_CLINIC_OR_DEPARTMENT_OTHER): Payer: Self-pay | Admitting: Emergency Medicine

## 2017-07-23 DIAGNOSIS — Y999 Unspecified external cause status: Secondary | ICD-10-CM | POA: Diagnosis not present

## 2017-07-23 DIAGNOSIS — S92334A Nondisplaced fracture of third metatarsal bone, right foot, initial encounter for closed fracture: Secondary | ICD-10-CM | POA: Insufficient documentation

## 2017-07-23 DIAGNOSIS — Y939 Activity, unspecified: Secondary | ICD-10-CM | POA: Diagnosis not present

## 2017-07-23 DIAGNOSIS — I10 Essential (primary) hypertension: Secondary | ICD-10-CM | POA: Diagnosis not present

## 2017-07-23 DIAGNOSIS — F17210 Nicotine dependence, cigarettes, uncomplicated: Secondary | ICD-10-CM | POA: Insufficient documentation

## 2017-07-23 DIAGNOSIS — Z79899 Other long term (current) drug therapy: Secondary | ICD-10-CM | POA: Diagnosis not present

## 2017-07-23 DIAGNOSIS — Y929 Unspecified place or not applicable: Secondary | ICD-10-CM | POA: Diagnosis not present

## 2017-07-23 DIAGNOSIS — X509XXA Other and unspecified overexertion or strenuous movements or postures, initial encounter: Secondary | ICD-10-CM | POA: Insufficient documentation

## 2017-07-23 DIAGNOSIS — S82841A Displaced bimalleolar fracture of right lower leg, initial encounter for closed fracture: Secondary | ICD-10-CM | POA: Diagnosis not present

## 2017-07-23 DIAGNOSIS — S99921A Unspecified injury of right foot, initial encounter: Secondary | ICD-10-CM | POA: Diagnosis present

## 2017-07-23 MED ORDER — OXYCODONE HCL 5 MG PO TABS: 5.0000 mg | ORAL_TABLET | ORAL | 0 refills | Status: DC | PRN

## 2017-07-23 MED ORDER — HYDROCODONE-ACETAMINOPHEN 5-325 MG PO TABS: 1.0000 | ORAL_TABLET | Freq: Four times a day (QID) | ORAL | 0 refills | Status: DC | PRN

## 2017-07-23 MED ORDER — HYDROCODONE-ACETAMINOPHEN 5-325 MG PO TABS
1.0000 | ORAL_TABLET | Freq: Once | ORAL | Status: AC
  Administered 2017-07-23: 1 via ORAL
  Filled 2017-07-23: qty 1

## 2017-07-23 MED FILL — HYDROCODON-APAP 5-325: 5-325 | 3 days supply | Qty: 15 | Fill #0

## 2017-07-23 NOTE — ED Triage Notes (Signed)
Reports tripping in the middle of the night over toy.  C/o pain to right ankle.

## 2017-07-23 NOTE — ED Provider Notes (Signed)
[facility] EMERGENCY DEPARTMENT Provider Note   CSN: [id number] Arrival date & time: 07/23/17  1148     History   Chief Complaint Chief Complaint  Patient presents with  . Ankle Injury    HPI BRIDGIT Amy Villa is a 52 y.o. female.  HPI 52 year old Caucasian female with no pertinent past medical history presents to the emergency department for evaluation of right ankle pain after mechanical fall yesterday.  Patient states that she tripped over her dog play toy and twisted her right ankle.  The patient denies head injury or LOC.  Patient has not been able to bear weight on her right ankle due to the pain.  The patient denies any paresthesias or weakness.  Denies any open wounds.  She denies any pain in her right hip or right knee.  She is not take anything for the pain prior to arrival.  Nothing makes her symptoms better or worse. Past Medical History:  Diagnosis Date  . Alcoholic hepatitis   . Aortic atherosclerosis ([place])   . Arthritis    neck  . Carpal tunnel syndrome on both sides 02/2012  . Cigarette nicotine dependence   . Contact lens/glasses fitting    wears contacts or glasses  . Dental crowns present    x 2 upper front  . Elevated LFTs   . Fatty liver   . Fluid retention   . History of MRSA infection   . Hypertension   . Insomnia   . Migraines   . Osteoarthritis   . Parasomnia   . Wears partial dentures    lower    Patient Active Problem List   Diagnosis Date Noted  . Substance abuse ([place]) 03/13/2017  . Hypokalemia 03/13/2017  . Parasomnia 12/28/2016  . Aortic atherosclerosis ([place]) 12/01/2016  . Multiple fractures 12/01/2016  . Hepatic steatosis 12/01/2016  . Insomnia 12/01/2016  . Anxiety and depression 12/01/2016  . Liver cirrhosis ([place]) 11/16/2016  . Palpitations 08/23/2016  . Nicotine dependence 07/11/2013  . DJD (degenerative joint disease) 07/11/2013  . DDD (degenerative disc disease) 07/11/2013  . Anemia 07/11/2013  . Narcotic  abuse ([place]) 07/11/2013  . CMC arthritis, thumb, degenerative 06/23/2013  . Alcohol abuse 09/02/2010  . PANCREATITIS 09/02/2010  . Benign essential hypertension 06/09/2005    Past Surgical History:  Procedure Laterality Date  . CARPAL TUNNEL RELEASE  03/17/2012   Procedure: CARPAL TUNNEL RELEASE;  Surgeon: Roseanne Kaufman, MD;  Location: [facility];  Service: Orthopedics;  Laterality: Bilateral;  left limited open carpal tunnel release. right carpal tunnel injection with 1cc of celestone and 2cc of marcaine.25%  . CARPAL TUNNEL RELEASE  06/30/2012   Procedure: CARPAL TUNNEL RELEASE;  Surgeon: Roseanne Kaufman, MD;  Location: [facility];  Service: Orthopedics;  Laterality: Right;  RIGHT LIMITED OPEN CARPAL TUNNEL RELEASE  . CARPOMETACARPAL ([facility]) FUSION OF THUMB Left 06/23/2013   Procedure: LEFT CARPOMETACARPEL ([place]) FUSION OF THUMB WITH AUTOGRAFT FROM RADIUS AND REPAIR AS NECESSARY;  Surgeon: Roseanne Kaufman, MD;  Location: [facility];  Service: Orthopedics;  Laterality: Left;  . HARDWARE REMOVAL Left 07/10/2013   Procedure: HARDWARE REMOVAL;  Surgeon: Roseanne Kaufman, MD;  Location: [facility];  Service: Orthopedics;  Laterality: Left;  . INCISION AND DRAINAGE OF WOUND Left 07/10/2013   Procedure: IRRIGATION AND DEBRIDEMENT WOUND left wrist  ;  Surgeon: Roseanne Kaufman, MD;  Location: [facility];  Service: Orthopedics;  Laterality: Left;  . LAPAROSCOPIC VAGINAL HYSTERECTOMY  02/24/2010  . LAPAROSCOPY  06/2010  for adhesions  . LEEP     x 2  . LUMBAR DISC SURGERY  03/23/2003   left L5-S1 discectomy with microdissection  . LUMBAR DISC SURGERY  05/23/2003   redo discectomy L5-S1 with microdissection    OB History    No data available       Home Medications    Prior to Admission medications   Medication Sig Start Date End Date Taking? Authorizing Provider  busPIRone (BUSPAR) 10 MG tablet Take 1 tablet (10 mg total) by mouth 2 (two) times daily. Patient not taking: Reported  on 05/28/2017 03/12/17   [name], PA-C  cyclobenzaprine (FLEXERIL) 5 MG tablet TAKE 1 TABLET(5 MG) BY MOUTH AT BEDTIME 05/25/17   [name], PA-C  fluocinonide (LIDEX) 0.05 % external solution APPLY ON THE SKIN BID PRN 10/09/16   [provider]  FLUoxetine (PROZAC) 40 MG capsule Take 1 capsule (40 mg total) by mouth daily. 03/12/17   [name], PA-C  gabapentin (NEURONTIN) 300 MG capsule Take 2 capsules by mouth three times per day 04/28/17   [name], PA-C  hydrochlorothiazide (HYDRODIURIL) 25 MG tablet Take 25 mg by mouth daily.    [provider]  HYDROcodone-acetaminophen (NORCO) 5-325 MG tablet Take 1-2 tablets by mouth every 6 (six) hours as needed. 07/23/17   Doristine Devoid, PA-C  loratadine (CLARITIN) 10 MG tablet Take 10 mg by mouth daily.    [provider]  Omega-3 Fatty Acids (FISH OIL) 1000 MG CAPS Take 1 capsule (1,000 mg total) by mouth daily. Patient not taking: Reported on 05/28/2017 12/03/16   [name], PA-C  ondansetron (ZOFRAN-ODT) 4 MG disintegrating tablet Take 1 tablet (4 mg total) every 8 (eight) hours as needed by mouth for nausea or vomiting. 05/28/17   [name], MD  pantoprazole (PROTONIX) 40 MG tablet Take 1 tablet (40 mg total) daily before breakfast by mouth. 05/28/17   [name], MD  potassium chloride SA (K-DUR,KLOR-CON) 20 MEQ tablet Take 1 tablet (20 mEq total) by mouth daily. 01/09/17   Malvin Johns, MD    Family History Family History  Problem Relation Age of Onset  . Diabetes Mother   . Hypertension Mother   . Colon polyps Sister   . Hypertension Sister   . Diabetes Sister   . Lung cancer Maternal Grandmother   . Colon cancer Neg Hx   . Esophageal cancer Neg Hx   . Pancreatic cancer Neg Hx   . Stomach cancer Neg Hx   . Liver disease Neg Hx     Social History Social History   Tobacco Use  . Smoking status: Current Every Day Smoker    Packs/day: 1.00    Years: 10.00      Pack years: 10.00    Types: Cigarettes  . Smokeless tobacco: Never Used  . Tobacco comment: now has patch 11/18/16  Substance Use Topics  . Alcohol use: Yes    Comment: 2 Mikes hard drinks; now has only occasional as of 11/18/16   . Drug use: No     Allergies   Patient has no known allergies.   Review of Systems Review of Systems  Musculoskeletal: Positive for arthralgias, joint swelling and myalgias.  Skin: Positive for color change. Negative for wound.  Neurological: Negative for weakness and numbness.     Physical Exam Updated Vital Signs BP (!) 151/85 (BP Location: Right Arm)   Pulse 74   Temp 98.5 F (36.9  C) (Oral)   Resp 18   Ht 5\' 2"  (1.575 m)   Wt 61.2 kg (135 lb)   SpO2 100%   BMI 24.69 kg/m   Physical Exam  Constitutional: She appears well-developed and well-nourished. No distress.  HENT:  Head: Normocephalic and atraumatic.  Eyes: Right eye exhibits no discharge. Left eye exhibits no discharge. No scleral icterus.  Neck: Normal range of motion.  Pulmonary/Chest: No respiratory distress.  Musculoskeletal:       Right ankle: She exhibits decreased range of motion, swelling and ecchymosis. She exhibits no deformity, no laceration and normal pulse. Tenderness. Lateral malleolus, medial malleolus and head of 5th metatarsal tenderness found.       Right foot: There is decreased range of motion, tenderness and bony tenderness. There is no swelling, normal capillary refill, no crepitus and no deformity.  Patient has some erythema over the right knee where she fell.  Full range of motion of the right knee without pain.  No pain with palpation over the right knee.  No joint laxity or obvious deformity.  DP pulses are 2+ bilaterally.  Sensation intact.  Cap refill is normal.  Neurological: She is alert.  Skin: Skin is warm and dry. Capillary refill takes less than 2 seconds. No pallor.  Psychiatric: Her behavior is normal. Judgment and thought content normal.   Nursing note and vitals reviewed.    ED Treatments / Results  Labs (all labs ordered are listed, but only abnormal results are displayed) Labs Reviewed - No data to display  EKG  EKG Interpretation None       Radiology Dg Ankle Complete Right  Result Date: 07/23/2017 CLINICAL DATA:  Tripped over dog toy last night with injury to RIGHT ankle, pain and swelling anteriorly, medially and laterally EXAM: RIGHT ANKLE - COMPLETE 3+ VIEW COMPARISON:  None FINDINGS: Diffuse soft tissue swelling. Slight widening of the ankle mortise. Mildly displaced transverse fracture of the medial malleolus. Mildly displaced oblique fracture of the lateral malleolus. Minimally displaced posterior malleolar fracture of the distal tibia. No dislocation. Old healed fracture of the third metatarsal shaft. Deformity of the base of the fifth metatarsal, ill-defined, favoring sequela of remote fracture though recommend correlation for pain/tenderness at this site to exclude acute injury. IMPRESSION: Mildly displaced bimalleolar fractures of the RIGHT ankle with associated soft tissue swelling and widening of the ankle mortise. Old fracture of third metatarsal and probable old fracture at fifth metatarsal base ; recommend correlation with physical exam to exclude acute injury at the base of fifth metatarsal. Electronically Signed   By: Lavonia Dana M.D.   On: 07/23/2017 12:35   Dg Foot Complete Right  Result Date: 07/23/2017 CLINICAL DATA:  Tripped over dog.  Pain across the top of the foot. EXAM: RIGHT FOOT COMPLETE - 3+ VIEW COMPARISON:  None. FINDINGS: Acute nondisplaced fracture of the base of the third metatarsal. Chronic healed right third metatarsal shaft fracture. Ununited fracture at the base of the fifth metatarsal with the margins appearing corticated concerning for nonunion with 7.3 mm of distraction. Partially visualized is a displaced medial malleolar fracture. Partially visualized is a distal fibular  metaphyseal fracture. Soft tissue swelling around the ankle.  No radiopaque foreign body. IMPRESSION: 1. Acute nondisplaced fracture of the base of the third metatarsal. 2. Ununited fracture at the base of the fifth metatarsal with the margins appearing corticated concerning for nonunion with 7.3 mm of distraction. 3. Partially visualized is a displaced medial malleolar fracture. Partially visualized is  a distal fibular metaphyseal fracture. Electronically Signed   By: Kathreen Devoid   On: 07/23/2017 14:14    Procedures Procedures (including critical care time) SPLINT APPLICATION Date/Time: 9:67 PM Authorized by: Ocie Cornfield Consent: Verbal consent obtained. Risks and benefits: risks, benefits and alternatives were discussed Consent given by: patient Splint applied by: orthopedic technician and myself Location details: Right ankle Splint type: A posterior short-leg and stirrup splint Supplies used: Fiberglas material and crutches Post-procedure: The splinted body part was neurovascularly unchanged following the procedure. Patient tolerance: Patient tolerated the procedure well with no immediate complications.    Medications Ordered in ED Medications  HYDROcodone-acetaminophen (NORCO/VICODIN) 5-325 MG per tablet 1 tablet (1 tablet Oral Given 07/23/17 1358)     Initial Impression / Assessment and Plan / ED Course  I have reviewed the triage vital signs and the nursing notes.  Pertinent labs & imaging results that were available during my care of the patient were reviewed by me and considered in my medical decision making (see chart for details).     Patient presents to the emergency department today for evaluation of right ankle and foot pain after mechanical fall yesterday.  Patient denies head injury or LOC.  Patient is neurovascularly intact.  X-ray reveals a bimalleolar fracture along with a third metatarsal fracture.  Patient also has several old nonunion fractures of her right  foot from prior injury.  Discussed case with Jeffrey's PA-C.  He recommends patient be placed in a short-leg and stirrup splint with crutches and no weightbearing until follow-up with orthopedics next week.  The patient did have some erythema over the right knee but no pain with range of motion or palpation.  Offered patient x-ray and she declined at this time.  Patient was reevaluated to splint was placed and remains neurovascularly intact.  I have given her referral to orthopedics.  Pt is hemodynamically stable, in NAD, & able to ambulate in the ED. Evaluation does not show pathology that would require ongoing emergent intervention or inpatient treatment. I explained the diagnosis to the patient. Pain has been managed & has no complaints prior to dc. Pt is comfortable with above plan and is stable for discharge at this time. All questions were answered prior to disposition. Strict return precautions for f/u to the ED were discussed. Encouraged follow up with PCP.   Pt was given rx for norco, however on review of her medical records patient does have history of alcoholic hepatitis.  Patient does have history of elevated liver enzymes.  I did call patient and instruct her to not take the [place].  Have given her a prescription for oxycodone.  Encouraged her to take Motrin as needed for pain.  Patient states that she would not take this medication and will take the new prescription tomorrow morning.  Patient states that she will follow-up if she develops any jaundice, right upper quadrant abdominal pain, nausea or emesis.  Final Clinical Impressions(s) / ED Diagnoses   Final diagnoses:  Closed bimalleolar fracture of right ankle, initial encounter  Closed nondisplaced fracture of third metatarsal bone of right foot, initial encounter    ED Discharge Orders        Ordered    HYDROcodone-acetaminophen (NORCO) 5-325 MG tablet  Every 6 hours PRN,   Status:  Discontinued     07/23/17 1434    oxyCODONE  (ROXICODONE) 5 MG immediate release tablet  Every 4 hours PRN     07/23/17 1725  Doristine Devoid, PA-C 07/23/17 1747    Margette Fast, MD 07/23/17 2002

## 2017-07-23 NOTE — Discharge Instructions (Signed)
Norco and motrin as needed for pain. Ice affected area (see instructions below).  Please call the orthopedic physician listed today or first thing in the morning to schedule a follow up appointment.   No weightbearing until follow-up in 1 week with orthopedist.  Have to call them for an appointment.  Have given you a referral.   Fractures generally take 4-6 weeks to heal. It is very important to keep your splint dry until your follow up with the orthopedic doctor and a cast can be applied. You may place a plastic bag around the extremity with the splint while bathing to keep it dry. Also try to sleep with the extremity elevated for the next several nights to decrease swelling. Check the fingertips and toes several times per day to make sure they are not cold, pale, or blue. If this is the case, the splint may be too tight and should return to the ER, your regular doctor or the orthopedist for recheck. Return to the ER for new or worsening symptoms, any additional concerns.   COLD THERAPY DIRECTIONS:  Ice or gel packs can be used to reduce both pain and swelling. Ice is the most helpful within the first 24 to 48 hours after an injury or flareup from overusing a muscle or joint.  Ice is effective, has very few side effects, and is safe for most people to use.   If you expose your skin to cold temperatures for too long or without the proper protection, you can damage your skin or nerves. Watch for signs of skin damage due to cold.   HOME CARE INSTRUCTIONS  Follow these tips to use ice and cold packs safely.  Place a dry or damp towel between the ice and skin. A damp towel will cool the skin more quickly, so you may need to shorten the time that the ice is used.  For a more rapid response, add gentle compression to the ice.  Ice for no more than 10 to 20 minutes at a time. The bonier the area you are icing, the less time it will take to get the benefits of ice.  Check your skin after 5 minutes to make  sure there are no signs of a poor response to cold or skin damage.  Rest 20 minutes or more in between uses.  Once your skin is numb, you can end your treatment. You can test numbness by very lightly touching your skin. The touch should be so light that you do not see the skin dimple from the pressure of your fingertip. When using ice, most people will feel these normal sensations in this order: cold, burning, aching, and numbness.

## 2017-07-23 NOTE — ED Notes (Signed)
Patient transported to X-ray 

## 2017-07-26 MED FILL — oxyCODONE HCL 5 MG TABS: 5 | 2 days supply | Qty: 12 | Fill #0

## 2017-08-06 ENCOUNTER — Ambulatory Visit: Payer: Self-pay | Admitting: Internal Medicine

## 2017-08-13 ENCOUNTER — Telehealth: Payer: Self-pay | Admitting: *Deleted

## 2017-08-13 ENCOUNTER — Other Ambulatory Visit: Payer: Self-pay | Admitting: Physician Assistant

## 2017-08-13 DIAGNOSIS — G8929 Other chronic pain: Secondary | ICD-10-CM

## 2017-08-13 NOTE — Telephone Encounter (Signed)
Patient is requesting a referral to pain management.   Copied from [place] [id number]. Topic: Referral - Status >> Aug 13, 2017  2:50 PM Yvette Rack wrote: Reason for CRM: patient calling to see if her referral was done for Pain Management because she haven't heard from anyone

## 2017-08-13 NOTE — Telephone Encounter (Signed)
Referral has been placed for pain clinic. Angela at front desk is aware.

## 2017-08-13 NOTE — Telephone Encounter (Signed)
Ok to place referral. She was already seeing another specialist previously. Her medications were stopped due to alcohol use. Happy for her to see a new specialist, but doubt they will give her any narcotic medication if she continues to drink. They may be able to offer other pain relief options.

## 2017-08-13 NOTE — Addendum Note (Signed)
Addended by: Katina Dung on: 08/13/2017 04:47 PM   Modules accepted: Orders

## 2017-08-16 ENCOUNTER — Encounter: Payer: Self-pay | Admitting: Emergency Medicine

## 2017-08-19 ENCOUNTER — Ambulatory Visit (INDEPENDENT_AMBULATORY_CARE_PROVIDER_SITE_OTHER): Payer: BLUE CROSS/BLUE SHIELD | Admitting: Physician Assistant

## 2017-08-19 ENCOUNTER — Encounter: Payer: Self-pay | Admitting: Physician Assistant

## 2017-08-19 VITALS — BP 108/70 | HR 94 | Temp 98.6°F | Ht 62.0 in | Wt 129.0 lb

## 2017-08-19 DIAGNOSIS — F419 Anxiety disorder, unspecified: Secondary | ICD-10-CM

## 2017-08-19 DIAGNOSIS — K7030 Alcoholic cirrhosis of liver without ascites: Secondary | ICD-10-CM | POA: Diagnosis not present

## 2017-08-19 DIAGNOSIS — Z23 Encounter for immunization: Secondary | ICD-10-CM

## 2017-08-19 DIAGNOSIS — F329 Major depressive disorder, single episode, unspecified: Secondary | ICD-10-CM | POA: Diagnosis not present

## 2017-08-19 DIAGNOSIS — I10 Essential (primary) hypertension: Secondary | ICD-10-CM

## 2017-08-19 DIAGNOSIS — F32A Depression, unspecified: Secondary | ICD-10-CM

## 2017-08-19 LAB — HEPATIC FUNCTION PANEL
ALK PHOS: 230 U/L — AB (ref 39–117)
ALT: 27 U/L (ref 0–35)
AST: 67 U/L — ABNORMAL HIGH (ref 0–37)
Albumin: 4.1 g/dL (ref 3.5–5.2)
BILIRUBIN DIRECT: 0.2 mg/dL (ref 0.0–0.3)
Total Bilirubin: 0.6 mg/dL (ref 0.2–1.2)
Total Protein: 7.4 g/dL (ref 6.0–8.3)

## 2017-08-19 LAB — GAMMA GT: GGT: 628 U/L — AB (ref 7–51)

## 2017-08-19 LAB — AMMONIA: AMMONIA: 54 umol/L — AB (ref 11–35)

## 2017-08-19 MED ORDER — CYCLOBENZAPRINE HCL 5 MG PO TABS: ORAL_TABLET | ORAL | 0 refills | Status: DC

## 2017-08-19 MED ORDER — BUSPIRONE HCL 10 MG PO TABS: 10.0000 mg | ORAL_TABLET | Freq: Two times a day (BID) | ORAL | 3 refills | Status: DC

## 2017-08-19 NOTE — Progress Notes (Signed)
Patient presents to clinic today for follow-up. Patient with history of hypertension, alcoholic hepatitis and cirrhosis, OA, and chronic cervicalgia. Is followed by Gastroenterology and Pain Management.   Hypertension -- Patient is currently on a regimen of Hydrochlorothiazide 25 mg daily along with potassium supplementation. Is taking as directed and tolerating well. Patient denies chest pain, palpitations, lightheadedness, dizziness, vision changes or frequent headaches.  Anxiety -- Currently on a regimen of BuSpar 10 mg BID. Is taking as directed.  Notes this is helping considerably with anxiety.   Alcoholic Hepatitis/Cirrhosis -- Followed by Gastroenterology. Has not scheduled follow-up appointment as directed at last visit. Has cut back significantly on alcohol consumption. Is avoiding tylenol-containing products. Is trying to hydrate and eat a proper diet. States she is feeling so much better than she has in several months.   Chronic Pain  -- Previously followed by [facility]. Is currently awaiting appointment with new specialist. Is currently on Oxycodone by Orthopedics for ankle fracture. Pain is well controlled. Is also taking Gabapentin as directed which helps significantly per patient.    Past Medical History:  Diagnosis Date  . Alcoholic hepatitis   . Aortic atherosclerosis ([place])   . Arthritis    neck  . Carpal tunnel syndrome on both sides 02/2012  . Cigarette nicotine dependence   . Contact lens/glasses fitting    wears contacts or glasses  . Dental crowns present    x 2 upper front  . Elevated LFTs   . Fatty liver   . Fluid retention   . History of MRSA infection   . Hypertension   . Insomnia   . Migraines   . Osteoarthritis   . Parasomnia   . Wears partial dentures    lower    Current Outpatient Medications on File Prior to Visit  Medication Sig Dispense Refill  . FLUoxetine (PROZAC) 40 MG capsule Take 1 capsule (40 mg total) by mouth daily. 30  capsule 3  . gabapentin (NEURONTIN) 300 MG capsule TAKE 2 CAPSULES BY MOUTH THREE TIMES DAILY 180 capsule 0  . hydrochlorothiazide (HYDRODIURIL) 25 MG tablet Take 25 mg by mouth daily.    [name] loratadine (CLARITIN) 10 MG tablet Take 10 mg by mouth daily.    . ondansetron (ZOFRAN-ODT) 4 MG disintegrating tablet Take 1 tablet (4 mg total) every 8 (eight) hours as needed by mouth for nausea or vomiting. 20 tablet 2  . oxyCODONE (ROXICODONE) 5 MG immediate release tablet Take 1 tablet (5 mg total) by mouth every 4 (four) hours as needed for severe pain. 12 tablet 0  . pantoprazole (PROTONIX) 40 MG tablet Take 1 tablet (40 mg total) daily before breakfast by mouth. 90 tablet 3  . potassium chloride SA (K-DUR,KLOR-CON) 20 MEQ tablet Take 1 tablet (20 mEq total) by mouth daily. 3 tablet 0  . busPIRone (BUSPAR) 10 MG tablet Take 1 tablet (10 mg total) by mouth 2 (two) times daily. (Patient not taking: Reported on 05/28/2017) 60 tablet 3  . cyclobenzaprine (FLEXERIL) 5 MG tablet TAKE 1 TABLET(5 MG) BY MOUTH AT BEDTIME (Patient not taking: Reported on 08/19/2017) 30 tablet 0  . fluocinonide (LIDEX) 0.05 % external solution APPLY ON THE SKIN BID PRN  0   No current facility-administered medications on file prior to visit.     No Known Allergies  Family History  Problem Relation Age of Onset  . Diabetes Mother   . Hypertension Mother   . Colon polyps Sister   . Hypertension Sister   .  Diabetes Sister   . Lung cancer Maternal Grandmother   . Colon cancer Neg Hx   . Esophageal cancer Neg Hx   . Pancreatic cancer Neg Hx   . Stomach cancer Neg Hx   . Liver disease Neg Hx     Social History   Socioeconomic History  . Marital status: Married    Spouse name: None  . Number of children: None  . Years of education: None  . Highest education level: None  Social Needs  . Financial resource strain: None  . Food insecurity - worry: None  . Food insecurity - inability: None  . Transportation needs -  medical: None  . Transportation needs - non-medical: None  Occupational History  . Occupation: cleaning  Tobacco Use  . Smoking status: Current Every Day Smoker    Packs/day: 1.00    Years: 10.00    Pack years: 10.00    Types: Cigarettes  . Smokeless tobacco: Never Used  . Tobacco comment: now has patch 11/18/16  Substance and Sexual Activity  . Alcohol use: Yes    Comment: 2 Mikes hard drinks; now has only occasional as of 11/18/16   . Drug use: No  . Sexual activity: No    Birth control/protection: Surgical  Other Topics Concern  . None  Social History Narrative  . None   Review of Systems - See HPI.  All other ROS are negative.  BP 108/70 (BP Location: Left Arm, Patient Position: Sitting, Cuff Size: Normal)   Pulse 94   Temp 98.6 F (37 C) (Oral)   Ht 5\' 2"  (1.575 m)   Wt 129 lb (58.5 kg)   SpO2 97%   BMI 23.59 kg/m   Physical Exam  Constitutional: She is oriented to person, place, and time and well-developed, well-nourished, and in no distress.  HENT:  Head: Normocephalic and atraumatic.  Eyes: Conjunctivae are normal.  Neck: Neck supple.  Cardiovascular: Normal rate, regular rhythm, normal heart sounds and intact distal pulses.  Pulmonary/Chest: Effort normal and breath sounds normal. No respiratory distress. She has no wheezes. She has no rales. She exhibits no tenderness.  Neurological: She is alert and oriented to person, place, and time.  Skin: Skin is warm and dry. No rash noted.  Psychiatric: Affect normal.  Vitals reviewed.  Assessment/Plan: 1. Alcoholic cirrhosis of liver without ascites ([place]) Repeat labs today. Clinically patient seems to have more energy than prior visits. No noted abdominal bloating or distention. No icterus noted. Patient instructed to schedule follow-up with specialist and to contact us with appointment date/time. Continue limitation of alcohol products. She is still refusing AA.  - Ammonia - Gamma GT - Hepatic function panel  2.  Anxiety and depression Stable. Continue BuSpar as directed. - busPIRone (BUSPAR) 10 MG tablet; Take 1 tablet (10 mg total) by mouth 2 (two) times daily.  Dispense: 60 tablet; Refill: 3  3. Need for prophylactic vaccination and inoculation against influenza Flu shot given - Flu Vaccine QUAD 36+ mos IM  4. Benign essential hypertension Stable. Continue current regimen. Repeat labs today.    Leeanne Rio, PA-C

## 2017-08-19 NOTE — Patient Instructions (Signed)
Please go to the lab today for blood work.  I will call you with your results. We will alter treatment regimen(s) if indicated by your results.   I have refilled medications. Restart Gabapentin and BuSpar as directed. Follow-up with Orthopedics tomorrow as scheduled. Ask them about the Meloxicam.   Follow-up with me in 3 weeks for further medication adjustments.

## 2017-08-20 ENCOUNTER — Telehealth: Payer: Self-pay | Admitting: Emergency Medicine

## 2017-08-20 NOTE — Telephone Encounter (Signed)
FYI Patient called GI to schedule an appointment. She is scheduled for March 27 at 11:30

## 2017-08-25 DIAGNOSIS — Z23 Encounter for immunization: Secondary | ICD-10-CM | POA: Insufficient documentation

## 2017-09-08 ENCOUNTER — Other Ambulatory Visit: Payer: Self-pay | Admitting: Physician Assistant

## 2017-09-09 ENCOUNTER — Ambulatory Visit: Payer: Self-pay | Admitting: Physician Assistant

## 2017-09-13 ENCOUNTER — Other Ambulatory Visit: Payer: Self-pay | Admitting: Internal Medicine

## 2017-09-13 ENCOUNTER — Other Ambulatory Visit: Payer: Self-pay | Admitting: Physician Assistant

## 2017-09-13 DIAGNOSIS — F32A Depression, unspecified: Secondary | ICD-10-CM

## 2017-09-13 DIAGNOSIS — F419 Anxiety disorder, unspecified: Principal | ICD-10-CM

## 2017-09-13 DIAGNOSIS — F329 Major depressive disorder, single episode, unspecified: Secondary | ICD-10-CM

## 2017-09-13 NOTE — Telephone Encounter (Signed)
Patient was called, left VM to call the orthopedic physician for the refill of Meloxicam, since he prescribed it. I also informed that the request for Gabapentin pills to increase will be sent to [facility] for approval.   Requesting Gabapentin 210 pills instead of 180 as ordered on 08/16/17. She said she was told to take an extra 1 tab at bedtime. This is not noted in the last OV note 08/19/17. PCP: [place]:  [facility] [place], [place] - [address] [place] [address] [place] [address] [place] [address] [phone] (Phone) [phone] (Fax)

## 2017-09-13 NOTE — Telephone Encounter (Signed)
Copied from [place]. Topic: Quick Communication - Rx Refill/Question >> Sep 13, 2017 10:18 AM Amy Villa, NT wrote: Medication: meloxicam  10 ? Mg  this in not on her med list , and busPIRone (BUSPAR) 10 MG tablet , cyclobenzaprine (FLEXERIL) 5 MG tablet and gabapentin (NEURONTIN) 300 MG capsule she says it should be for 210 instead of the 180 she was told to take an additional one at night ? Has the patient contacted their pharmacy? {yes  (Agent: If no, request that the patient contact the pharmacy for the refill.) Preferred Pharmacy (with phone number or street name):[facility] [place], [place] - [address] [place] [address] [place] [address] [place] [address] [phone] (Phone) [phone] (Fax Agent: Please be advised that RX refills may take up to 3 business days. We ask that you follow-up with your pharmacy. This is not on her medication list anymore ,she would like a refill

## 2017-09-13 NOTE — Telephone Encounter (Signed)
Pt has called to state that her orthopedic surgeon gave the okay for the meloxicam, if Dr. Hassell Done needs to contact that physician he can to confirm  Pt was also checking the status of her other refill requests, contact pt if needed

## 2017-09-13 NOTE — Telephone Encounter (Signed)
Patient returned call and says "the orthopedic surgeon gave the ok to take the meloxicam, so Amy Villa will need to fill it, that's why I called to let him know."  I advised this would be sent over to him for approval, she verbalized understanding.

## 2017-09-14 ENCOUNTER — Ambulatory Visit: Payer: Self-pay | Admitting: Physician Assistant

## 2017-09-14 NOTE — Telephone Encounter (Signed)
Refill x 2 ok 

## 2017-09-14 NOTE — Telephone Encounter (Signed)
May I refill Sir? She has a March appointment to see you.

## 2017-09-14 NOTE — Telephone Encounter (Signed)
Patient wants to know if the Flexeril will be refilled.

## 2017-09-15 MED ORDER — BUSPIRONE HCL 10 MG PO TABS: 10.0000 mg | ORAL_TABLET | Freq: Two times a day (BID) | ORAL | 3 refills | Status: DC

## 2017-09-15 NOTE — Telephone Encounter (Signed)
Cyclobenzaprine refill Last OV: 08/19/17 Last Refill:08/19/17 Pharmacy: [facility] [place]

## 2017-09-16 ENCOUNTER — Other Ambulatory Visit: Payer: Self-pay

## 2017-09-16 ENCOUNTER — Ambulatory Visit (INDEPENDENT_AMBULATORY_CARE_PROVIDER_SITE_OTHER): Payer: BLUE CROSS/BLUE SHIELD | Admitting: Physician Assistant

## 2017-09-16 ENCOUNTER — Encounter: Payer: Self-pay | Admitting: Physician Assistant

## 2017-09-16 VITALS — BP 116/80 | HR 89 | Temp 98.6°F | Resp 16 | Ht 62.0 in | Wt 129.0 lb

## 2017-09-16 DIAGNOSIS — F329 Major depressive disorder, single episode, unspecified: Secondary | ICD-10-CM | POA: Diagnosis not present

## 2017-09-16 DIAGNOSIS — F419 Anxiety disorder, unspecified: Secondary | ICD-10-CM

## 2017-09-16 DIAGNOSIS — F32A Depression, unspecified: Secondary | ICD-10-CM

## 2017-09-16 MED ORDER — GABAPENTIN 300 MG PO CAPS: ORAL_CAPSULE | ORAL | 3 refills | Status: DC

## 2017-09-16 MED ORDER — CYCLOBENZAPRINE HCL 10 MG PO TABS: 10.0000 mg | ORAL_TABLET | Freq: Two times a day (BID) | ORAL | 0 refills | Status: DC | PRN

## 2017-09-16 NOTE — Progress Notes (Signed)
Patient presents to clinic today for follow-up of anxiety and chronic pain.  At last visit, patient was restarted on her BuSpar and Gabapentin. Endorses taking as directed. Notes improvement in anxiety and overall pain now that she is back on medication. Is taking Gabapentin 2 tablets three times daily currently. Is still on Hydrocodone with Orthopedics due to the ankle fracture. They are slowly weaning down. She has follow-up tomorrow.   Past Medical History:  Diagnosis Date  . Alcoholic hepatitis   . Aortic atherosclerosis ([place])   . Arthritis    neck  . Carpal tunnel syndrome on both sides 02/2012  . Cigarette nicotine dependence   . Contact lens/glasses fitting    wears contacts or glasses  . Dental crowns present    x 2 upper front  . Elevated LFTs   . Fatty liver   . Fluid retention   . History of MRSA infection   . Hypertension   . Insomnia   . Migraines   . Osteoarthritis   . Parasomnia   . Wears partial dentures    lower    Current Outpatient Medications on File Prior to Visit  Medication Sig Dispense Refill  . busPIRone (BUSPAR) 10 MG tablet Take 1 tablet (10 mg total) by mouth 2 (two) times daily. 60 tablet 3  . FLUoxetine (PROZAC) 40 MG capsule Take 1 capsule (40 mg total) by mouth daily. 30 capsule 3  . hydrochlorothiazide (HYDRODIURIL) 25 MG tablet Take 25 mg by mouth daily.    [name] HYDROcodone-acetaminophen (NORCO) 10-325 MG tablet TK 1-2 TS PO Q 6 H PRN P  0  . loratadine (CLARITIN) 10 MG tablet Take 10 mg by mouth daily.    . ondansetron (ZOFRAN-ODT) 4 MG disintegrating tablet DISSOLVE 1 TABLET(4 MG) ON THE TONGUE EVERY 8 HOURS AS NEEDED FOR NAUSEA OR VOMITING 20 tablet 1  . pantoprazole (PROTONIX) 40 MG tablet Take 1 tablet (40 mg total) daily before breakfast by mouth. 90 tablet 3  . potassium chloride SA (K-DUR,KLOR-CON) 20 MEQ tablet Take 20 mEq by mouth 2 (two) times daily.     No current facility-administered medications on file prior to visit.     No  Known Allergies  Family History  Problem Relation Age of Onset  . Diabetes Mother   . Hypertension Mother   . Colon polyps Sister   . Hypertension Sister   . Diabetes Sister   . Lung cancer Maternal Grandmother   . Colon cancer Neg Hx   . Esophageal cancer Neg Hx   . Pancreatic cancer Neg Hx   . Stomach cancer Neg Hx   . Liver disease Neg Hx     Social History   Socioeconomic History  . Marital status: Married    Spouse name: None  . Number of children: None  . Years of education: None  . Highest education level: None  Social Needs  . Financial resource strain: None  . Food insecurity - worry: None  . Food insecurity - inability: None  . Transportation needs - medical: None  . Transportation needs - non-medical: None  Occupational History  . Occupation: cleaning  Tobacco Use  . Smoking status: Current Every Day Smoker    Packs/day: 1.00    Years: 10.00    Pack years: 10.00    Types: Cigarettes  . Smokeless tobacco: Never Used  . Tobacco comment: now has patch 11/18/16  Substance and Sexual Activity  . Alcohol use: Yes    Comment: 2  Mikes hard drinks; now has only occasional as of 11/18/16   . Drug use: No  . Sexual activity: No    Birth control/protection: Surgical  Other Topics Concern  . None  Social History Narrative  . None   Review of Systems - See HPI.  All other ROS are negative.  BP 116/80   Pulse 89   Temp 98.6 F (37 C) (Oral)   Resp 16   Ht 5\' 2"  (1.575 m)   Wt 129 lb (58.5 kg)   SpO2 98%   BMI 23.59 kg/m   Physical Exam  Constitutional: She is oriented to person, place, and time and well-developed, well-nourished, and in no distress.  HENT:  Head: Normocephalic and atraumatic.  Eyes: Conjunctivae are normal.  Neck: Neck supple.  Cardiovascular: Normal rate, regular rhythm, normal heart sounds and intact distal pulses.  Pulmonary/Chest: Effort normal and breath sounds normal. No respiratory distress. She has no wheezes. She has no rales.  She exhibits no tenderness.  Neurological: She is alert and oriented to person, place, and time.  Skin: Skin is warm and dry. No rash noted.  Psychiatric: Affect normal.  Vitals reviewed.  Recent Results (from the past 2160 hour(s))  Ammonia     Status: Abnormal   Collection Time: 08/19/17 11:35 AM  Result Value Ref Range   Ammonia 54 (H) 11 - 35 umol/L  Gamma GT     Status: Abnormal   Collection Time: 08/19/17 11:35 AM  Result Value Ref Range   GGT 628 (H) 7 - 51 U/L  Hepatic function panel     Status: Abnormal   Collection Time: 08/19/17 11:35 AM  Result Value Ref Range   Total Bilirubin 0.6 0.2 - 1.2 mg/dL   Bilirubin, Direct 0.2 0.0 - 0.3 mg/dL   Alkaline Phosphatase 230 (H) 39 - 117 U/L   AST 67 (H) 0 - 37 U/L   ALT 27 0 - 35 U/L   Total Protein 7.4 6.0 - 8.3 g/dL   Albumin 4.1 3.5 - 5.2 g/dL    Assessment/Plan: Anxiety and depression Improved now that medications as restarted. Gabapentin helping with anxiety and pain. Will increase nighttime dose to 3 tablets. Will refrain from increasing further while specialist has her on a narcotic pain medication.     Leeanne Rio, PA-C

## 2017-09-16 NOTE — Patient Instructions (Signed)
Please follow-up with Orthopedics tomorrow as scheduled. Continue medications with the changes made today.  Follow-up in 3-4 weeks for reassessment.

## 2017-09-17 ENCOUNTER — Telehealth: Payer: Self-pay | Admitting: Physician Assistant

## 2017-09-17 DIAGNOSIS — T07XXXA Unspecified multiple injuries, initial encounter: Secondary | ICD-10-CM

## 2017-09-17 NOTE — Telephone Encounter (Signed)
Pt says that she was seen by Hassell Done and was advised to speak with her ortho provider in regards to continuing Meloxicam medication. Pt says that she just left ortho office and was advised that it is okay for her to continue. If possible pt would like to have a refill on her medication.    Pharmacy:  [facility] [place], [place] - [address] [place] [address] [place] [address] [phone] (Phone) [phone] (Fax)

## 2017-09-19 NOTE — Telephone Encounter (Signed)
As discussed at visit, would not recommend Meloxicam due to her significant GERD. Would recommend increasing Gabapentin as she weans down off of pain medication given by Orthopedist for her ankle.

## 2017-09-20 NOTE — Telephone Encounter (Signed)
Dexa scan order previously order. Will have schedulers schedule patient.

## 2017-09-20 NOTE — Telephone Encounter (Signed)
Ok to place order 

## 2017-09-20 NOTE — Telephone Encounter (Signed)
Spoke with patient about Meloxicam medication. Per PCP not to take Meloxicam due to worsening GERD. She is agreeable and will proceed with increasing the Gabapentin. She will get the updated rx from the pharmacy for better pain control.  She saw Ortho, she is now wearing a walking boot and using the scooter. She can only put 50% of body weight on the ankle. She has swelling in the left foot, xray showed 3 hairline fractures in the left foot. Ortho wants her to have a bone density. Ok to place order

## 2017-09-21 ENCOUNTER — Other Ambulatory Visit: Payer: Self-pay | Admitting: Physician Assistant

## 2017-09-21 DIAGNOSIS — M84473A Pathological fracture, unspecified ankle, initial encounter for fracture: Secondary | ICD-10-CM

## 2017-09-21 DIAGNOSIS — Z780 Asymptomatic menopausal state: Secondary | ICD-10-CM

## 2017-09-23 NOTE — Assessment & Plan Note (Signed)
Improved now that medications as restarted. Gabapentin helping with anxiety and pain. Will increase nighttime dose to 3 tablets. Will refrain from increasing further while specialist has her on a narcotic pain medication.

## 2017-10-06 ENCOUNTER — Ambulatory Visit
Admission: RE | Admit: 2017-10-06 | Discharge: 2017-10-06 | Disposition: A | Discharge status: Home / Self Care | Payer: BLUE CROSS/BLUE SHIELD | Source: Ambulatory Visit | Attending: Physician Assistant | Admitting: Physician Assistant

## 2017-10-06 DIAGNOSIS — Z780 Asymptomatic menopausal state: Secondary | ICD-10-CM

## 2017-10-06 DIAGNOSIS — M84473A Pathological fracture, unspecified ankle, initial encounter for fracture: Secondary | ICD-10-CM

## 2017-10-07 ENCOUNTER — Ambulatory Visit: Payer: Self-pay | Admitting: Physician Assistant

## 2017-10-08 ENCOUNTER — Ambulatory Visit: Payer: BLUE CROSS/BLUE SHIELD | Admitting: Physician Assistant

## 2017-10-11 ENCOUNTER — Other Ambulatory Visit (HOSPITAL_BASED_OUTPATIENT_CLINIC_OR_DEPARTMENT_OTHER): Payer: Self-pay

## 2017-10-11 ENCOUNTER — Other Ambulatory Visit: Payer: Self-pay

## 2017-10-11 ENCOUNTER — Encounter: Payer: Self-pay | Admitting: Physician Assistant

## 2017-10-11 ENCOUNTER — Ambulatory Visit (INDEPENDENT_AMBULATORY_CARE_PROVIDER_SITE_OTHER): Payer: BLUE CROSS/BLUE SHIELD | Admitting: Physician Assistant

## 2017-10-11 VITALS — BP 122/82 | HR 90 | Temp 98.3°F | Resp 16 | Ht 62.0 in | Wt 138.0 lb

## 2017-10-11 DIAGNOSIS — Z1239 Encounter for other screening for malignant neoplasm of breast: Secondary | ICD-10-CM

## 2017-10-11 DIAGNOSIS — F419 Anxiety disorder, unspecified: Secondary | ICD-10-CM | POA: Diagnosis not present

## 2017-10-11 DIAGNOSIS — M8589 Other specified disorders of bone density and structure, multiple sites: Secondary | ICD-10-CM

## 2017-10-11 DIAGNOSIS — F329 Major depressive disorder, single episode, unspecified: Secondary | ICD-10-CM

## 2017-10-11 DIAGNOSIS — Z1231 Encounter for screening mammogram for malignant neoplasm of breast: Secondary | ICD-10-CM | POA: Diagnosis not present

## 2017-10-11 DIAGNOSIS — F32A Depression, unspecified: Secondary | ICD-10-CM

## 2017-10-11 DIAGNOSIS — R600 Localized edema: Secondary | ICD-10-CM

## 2017-10-11 LAB — VITAMIN D 25 HYDROXY (VIT D DEFICIENCY, FRACTURES): VITD: 19.55 ng/mL — ABNORMAL LOW (ref 30.00–100.00)

## 2017-10-11 MED ORDER — CELECOXIB 100 MG PO CAPS: 100.0000 mg | ORAL_CAPSULE | Freq: Every day | ORAL | 0 refills | Status: DC

## 2017-10-11 MED ORDER — CYCLOBENZAPRINE HCL 10 MG PO TABS: 10.0000 mg | ORAL_TABLET | Freq: Two times a day (BID) | ORAL | 0 refills | Status: DC | PRN

## 2017-10-11 NOTE — Progress Notes (Signed)
Patient presents to clinic today for follow-up of anxiety/depression. Also here to review recent DEXA scan. Patient with history of multiple non-traumatic fractures. Prior PTH levels have been normal. No history of CKD. In regards to anxiety, patient is taking medications as directed and doing very well. The Gabapentin increase is helping with mood, sleep and pain levels. Is still followed by Orthopedics.   Past Medical History:  Diagnosis Date  . Alcoholic hepatitis   . Aortic atherosclerosis ([place])   . Arthritis    neck  . Carpal tunnel syndrome on both sides 02/2012  . Cigarette nicotine dependence   . Contact lens/glasses fitting    wears contacts or glasses  . Dental crowns present    x 2 upper front  . Elevated LFTs   . Fatty liver   . Fluid retention   . History of MRSA infection   . Hypertension   . Insomnia   . Migraines   . Osteoarthritis   . Parasomnia   . Wears partial dentures    lower    Current Outpatient Medications on File Prior to Visit  Medication Sig Dispense Refill  . busPIRone (BUSPAR) 10 MG tablet Take 1 tablet (10 mg total) by mouth 2 (two) times daily. 60 tablet 3  . cyclobenzaprine (FLEXERIL) 10 MG tablet Take 1 tablet (10 mg total) by mouth 2 (two) times daily as needed for muscle spasms. 60 tablet 0  . FLUoxetine (PROZAC) 40 MG capsule Take 1 capsule (40 mg total) by mouth daily. 30 capsule 3  . gabapentin (NEURONTIN) 300 MG capsule Please take 2 capsules each morning and noon, and 3 capsules each evening. 210 capsule 3  . hydrochlorothiazide (HYDRODIURIL) 25 MG tablet Take 25 mg by mouth daily.    [name] HYDROcodone-acetaminophen (NORCO) 10-325 MG tablet TK 1-2 TS PO Q 6 H PRN P  0  . loratadine (CLARITIN) 10 MG tablet Take 10 mg by mouth daily.    . ondansetron (ZOFRAN-ODT) 4 MG disintegrating tablet DISSOLVE 1 TABLET(4 MG) ON THE TONGUE EVERY 8 HOURS AS NEEDED FOR NAUSEA OR VOMITING 20 tablet 1  . pantoprazole (PROTONIX) 40 MG tablet Take 1 tablet (40  mg total) daily before breakfast by mouth. 90 tablet 3  . potassium chloride SA (K-DUR,KLOR-CON) 20 MEQ tablet Take 20 mEq by mouth 2 (two) times daily.     No current facility-administered medications on file prior to visit.     No Known Allergies  Family History  Problem Relation Age of Onset  . Diabetes Mother   . Hypertension Mother   . Colon polyps Sister   . Hypertension Sister   . Diabetes Sister   . Lung cancer Maternal Grandmother   . Colon cancer Neg Hx   . Esophageal cancer Neg Hx   . Pancreatic cancer Neg Hx   . Stomach cancer Neg Hx   . Liver disease Neg Hx     Social History   Socioeconomic History  . Marital status: Married    Spouse name: Not on file  . Number of children: Not on file  . Years of education: Not on file  . Highest education level: Not on file  Occupational History  . Occupation: Education administrator  Social Needs  . Financial resource strain: Not on file  . Food insecurity:    Worry: Not on file    Inability: Not on file  . Transportation needs:    Medical: Not on file    Non-medical: Not on file  Tobacco Use  . Smoking status: Current Every Day Smoker    Packs/day: 0.50    Years: 10.00    Pack years: 5.00    Types: Cigarettes  . Smokeless tobacco: Never Used  Substance and Sexual Activity  . Alcohol use: Yes    Comment: 2 Mikes hard drinks; now has only occasional as of 11/18/16   . Drug use: No  . Sexual activity: Never    Birth control/protection: Surgical  Lifestyle  . Physical activity:    Days per week: Not on file    Minutes per session: Not on file  . Stress: Not on file  Relationships  . Social connections:    Talks on phone: Not on file    Gets together: Not on file    Attends religious service: Not on file    Active member of club or organization: Not on file    Attends meetings of clubs or organizations: Not on file    Relationship status: Not on file  Other Topics Concern  . Not on file  Social History Narrative  .  Not on file   Review of Systems - See HPI.  All other ROS are negative.  BP 122/82   Pulse 90   Temp 98.3 F (36.8 C) (Oral)   Resp 16   Ht 5\' 2"  (1.575 m)   Wt 138 lb (62.6 kg)   SpO2 98%   BMI 25.24 kg/m   Physical Exam  Constitutional: She is oriented to person, place, and time and well-developed, well-nourished, and in no distress.  HENT:  Head: Normocephalic and atraumatic.  Eyes: Conjunctivae are normal.  Neck: Neck supple.  Cardiovascular: Normal rate, regular rhythm, normal heart sounds and intact distal pulses.  Pulses:      Popliteal pulses are 2+ on the right side.       Dorsalis pedis pulses are 2+ on the right side.       Posterior tibial pulses are 2+ on the right side.  2 + pitting edema of RLE noted distal and proximal to the area of fracture.   Pulmonary/Chest: Effort normal and breath sounds normal. No respiratory distress. She has no wheezes. She has no rales. She exhibits no tenderness.  Neurological: She is alert and oriented to person, place, and time.  Skin: Skin is warm and dry. No rash noted.  Psychiatric: Affect normal.  Vitals reviewed.  Recent Results (from the past 2160 hour(s))  Ammonia     Status: Abnormal   Collection Time: 08/19/17 11:35 AM  Result Value Ref Range   Ammonia 54 (H) 11 - 35 umol/L  Gamma GT     Status: Abnormal   Collection Time: 08/19/17 11:35 AM  Result Value Ref Range   GGT 628 (H) 7 - 51 U/L  Hepatic function panel     Status: Abnormal   Collection Time: 08/19/17 11:35 AM  Result Value Ref Range   Total Bilirubin 0.6 0.2 - 1.2 mg/dL   Bilirubin, Direct 0.2 0.0 - 0.3 mg/dL   Alkaline Phosphatase 230 (H) 39 - 117 U/L   AST 67 (H) 0 - 37 U/L   ALT 27 0 - 35 U/L   Total Protein 7.4 6.0 - 8.3 g/dL   Albumin 4.1 3.5 - 5.2 g/dL    Assessment/Plan: 1. Osteopenia of multiple sites Noted on DEXA. + history of fractures. Will start Citracal-D. Will check Vitamin D and PTH levels today. Will start treatment once results  are in. - PTH,  Intact and Calcium - Vitamin D (25 hydroxy)  2. Leg edema, right Potentially still significant swelling from remote surgery. Also concern for DVT. Will get STAT US.  - US Venous Img Lower Unilateral Right; Future  3. Breast cancer screening Screening mammogram ordered. Declines breast exam today.  - MM SCREENING BREAST TOMO BILATERAL; Future  4. Anxiety and depression Doing well with this. Continue current regimen.    Leeanne Rio, PA-C

## 2017-10-11 NOTE — Patient Instructions (Signed)
Please go to the lab today for blood work.  I will call you with your results. We will alter treatment regimen(s) if indicated by your results.   Speak with Benjamine Mola up front regarding your [place].  I will call with results and we will alter regimen according.   You will be contacted for a screening mammogram.  Please have this done at your earliest convenience.   Start a daily Citracal-D supplement.  We will start prescription treatment for osteopenia once I have results today.   Continue other medications as directed. I have printed and Rx for low-dose once daily Celebrex. Speak with your Gastroenterologist before taking.

## 2017-10-12 ENCOUNTER — Other Ambulatory Visit: Payer: Self-pay | Admitting: Emergency Medicine

## 2017-10-12 ENCOUNTER — Ambulatory Visit (HOSPITAL_BASED_OUTPATIENT_CLINIC_OR_DEPARTMENT_OTHER)
Admission: RE | Admit: 2017-10-12 | Discharge: 2017-10-12 | Disposition: A | Discharge status: Home / Self Care | Payer: BLUE CROSS/BLUE SHIELD | Source: Ambulatory Visit | Attending: Physician Assistant | Admitting: Physician Assistant

## 2017-10-12 DIAGNOSIS — Z1231 Encounter for screening mammogram for malignant neoplasm of breast: Secondary | ICD-10-CM | POA: Diagnosis present

## 2017-10-12 DIAGNOSIS — R600 Localized edema: Secondary | ICD-10-CM

## 2017-10-12 DIAGNOSIS — Z1239 Encounter for other screening for malignant neoplasm of breast: Secondary | ICD-10-CM

## 2017-10-12 LAB — PTH, INTACT AND CALCIUM
CALCIUM: 8.8 mg/dL (ref 8.6–10.4)
PTH: 89 pg/mL — AB (ref 14–64)

## 2017-10-12 MED ORDER — VITAMIN D (ERGOCALCIFEROL) 1.25 MG (50000 UNIT) PO CAPS: 50000.0000 [IU] | ORAL_CAPSULE | ORAL | 0 refills | Status: DC

## 2017-10-13 ENCOUNTER — Ambulatory Visit (INDEPENDENT_AMBULATORY_CARE_PROVIDER_SITE_OTHER): Payer: BLUE CROSS/BLUE SHIELD | Admitting: Internal Medicine

## 2017-10-13 ENCOUNTER — Other Ambulatory Visit: Payer: Self-pay

## 2017-10-13 ENCOUNTER — Encounter: Payer: Self-pay | Admitting: Internal Medicine

## 2017-10-13 VITALS — BP 138/80 | HR 80 | Ht 62.0 in | Wt 135.0 lb

## 2017-10-13 DIAGNOSIS — M8589 Other specified disorders of bone density and structure, multiple sites: Secondary | ICD-10-CM

## 2017-10-13 DIAGNOSIS — K709 Alcoholic liver disease, unspecified: Secondary | ICD-10-CM | POA: Diagnosis not present

## 2017-10-13 DIAGNOSIS — R1900 Intra-abdominal and pelvic swelling, mass and lump, unspecified site: Secondary | ICD-10-CM | POA: Diagnosis not present

## 2017-10-13 DIAGNOSIS — R7989 Other specified abnormal findings of blood chemistry: Secondary | ICD-10-CM | POA: Diagnosis not present

## 2017-10-13 MED ORDER — ALENDRONATE SODIUM 70 MG PO TABS: 70.0000 mg | ORAL_TABLET | ORAL | 1 refills | Status: DC

## 2017-10-13 MED ORDER — LACTULOSE 10 GM/15ML PO SOLN
20.0000 g | Freq: Every day | ORAL | 3 refills | Status: DC
Start: 2017-10-13 — End: 2018-02-15

## 2017-10-13 NOTE — Progress Notes (Signed)
Amy Villa 52 y.o. 1966/06/18 [id number]  Assessment & Plan:   Encounter Diagnoses  Name Primary?  . Alcoholic liver disease ([place]) Yes  . Abdominal swelling   . Increased ammonia level     We discussed the need for abstinence from alcohol again and while she recognizes that and says she wants to quit she has not move forward with definitive measures.  She has been in rehab before more than once.  [facility] and I suggest she call them again.  She may need psychiatric help as well.  Anxiety and depression treatment ongoing.  I am going to check a complete abdominal ultrasound to reassess her liver, and look for ascites.  Should she have ascites will need to start spironolactone plus or minus furosemide, would probably need to stop the HCTZ in that situation.  Start lactulose for increased ammonia level.  I do not detect any overt encephalopathy on exam today she is alert and oriented x3 and has no asterixis.  Could be subtle.  Reasonable to try low-dose lactulose and see how she does.  Follow-up will be scheduled after I see the ultrasound results.  I appreciate the opportunity to care for this patient. CC: Amy Jeans, PA-C    Subjective:   Chief Complaint: Follow-up of alcoholic liver disease  HPI Amy Villa is here, she had an ankle fracture with surgery on the right back in February, she had some swelling of that leg and yesterday ultrasound did not show DVT.  She is also noted to have other fractures without trauma.  She is being treated for osteoporosis and vitamin D deficiency.  She is following up here today because of alcoholic liver disease and alcoholic hepatitis lab testing.  Her ammonia level has been 54 recently though no confusion though she does suffer from insomnia.  She continues to drink alcohol and understands that is detrimental to her, she would like to quit and she is open to trying rehab again though she has been  through that before.  She is having neck and right upper extremity pain and is being referred for pain management and has discussed seeing a spine specialist as well I think. Last seen fall 2018 in this office due to vomiting And and regurgitation.  EGD 05/2017 Reflux esophagitis and mild portal gastropathy Omeprazole 20 mg changed to pantoprazole 40 mg.  Those symptoms have resolved.  She feels bloated and somewhat swollen in her abdomen is well.  She is on HCTZ for hypertension no diuretics otherwise.  She has not had ascites before.  Wt Readings from Last 3 Encounters:  10/13/17 135 lb (61.2 kg)  10/11/17 138 lb (62.6 kg)  09/16/17 129 lb (58.5 kg)   Recent lab tests January 31 LFTs, GGT high at 628 ammonia 54 AST 67 alk phos 230 March 25 vitamin D level 19.55 PTH intact high at 89 calcium normal at 8.8 No Known Allergies Current Meds  Medication Sig  . alendronate (FOSAMAX) 70 MG tablet Take 1 tablet (70 mg total) by mouth every 7 (seven) days. Take with a full glass of water on an empty stomach.  . busPIRone (BUSPAR) 10 MG tablet Take 1 tablet (10 mg total) by mouth 2 (two) times daily.  . cyclobenzaprine (FLEXERIL) 10 MG tablet Take 1 tablet (10 mg total) by mouth 2 (two) times daily as needed for muscle spasms.  [name] FLUoxetine (PROZAC) 40 MG capsule Take 1 capsule (40 mg total) by mouth  daily.  . gabapentin (NEURONTIN) 300 MG capsule Please take 2 capsules each morning and noon, and 3 capsules each evening.  . hydrochlorothiazide (HYDRODIURIL) 25 MG tablet Take 25 mg by mouth daily.  [name] HYDROcodone-acetaminophen (NORCO/VICODIN) 5-325 MG tablet Take 1 tablet by mouth every 6 (six) hours as needed for moderate pain.  [name] loratadine (CLARITIN) 10 MG tablet Take 10 mg by mouth daily.  . ondansetron (ZOFRAN-ODT) 4 MG disintegrating tablet DISSOLVE 1 TABLET(4 MG) ON THE TONGUE EVERY 8 HOURS AS NEEDED FOR NAUSEA OR VOMITING  . pantoprazole (PROTONIX) 40 MG tablet Take 1 tablet (40 mg total)  daily before breakfast by mouth.  . potassium chloride SA (K-DUR,KLOR-CON) 20 MEQ tablet Take 20 mEq by mouth 2 (two) times daily.  . Vitamin D, Ergocalciferol, (DRISDOL) 50000 units CAPS capsule Take 1 capsule (50,000 Units total) by mouth every 7 (seven) days.   Past Medical History:  Diagnosis Date  . Alcoholic hepatitis   . Aortic atherosclerosis ([place])   . Arthritis    neck  . Carpal tunnel syndrome on both sides 02/2012  . Cigarette nicotine dependence   . Contact lens/glasses fitting    wears contacts or glasses  . Dental crowns present    x 2 upper front  . Elevated LFTs   . Fatty liver   . Fluid retention   . History of MRSA infection   . Hypertension   . Insomnia   . Migraines   . Osteoarthritis   . Parasomnia   . Wears partial dentures    lower   Past Surgical History:  Procedure Laterality Date  . ANKLE ARTHROSCOPY WITH RECONSTRUCTION  07/2017  . CARPAL TUNNEL RELEASE  03/17/2012   Procedure: CARPAL TUNNEL RELEASE;  Surgeon: Roseanne Kaufman, MD;  Location: [facility];  Service: Orthopedics;  Laterality: Bilateral;  left limited open carpal tunnel release. right carpal tunnel injection with 1cc of celestone and 2cc of marcaine.25%  . CARPAL TUNNEL RELEASE  06/30/2012   Procedure: CARPAL TUNNEL RELEASE;  Surgeon: Roseanne Kaufman, MD;  Location: [facility];  Service: Orthopedics;  Laterality: Right;  RIGHT LIMITED OPEN CARPAL TUNNEL RELEASE  . CARPOMETACARPAL ([facility]) FUSION OF THUMB Left 06/23/2013   Procedure: LEFT CARPOMETACARPEL ([place]) FUSION OF THUMB WITH AUTOGRAFT FROM RADIUS AND REPAIR AS NECESSARY;  Surgeon: Roseanne Kaufman, MD;  Location: [facility];  Service: Orthopedics;  Laterality: Left;  . COLONOSCOPY  2014  . ESOPHAGOGASTRODUODENOSCOPY  05/2017  . HARDWARE REMOVAL Left 07/10/2013   Procedure: HARDWARE REMOVAL;  Surgeon: Roseanne Kaufman, MD;  Location: [facility];  Service: Orthopedics;  Laterality: Left;  . INCISION AND DRAINAGE OF WOUND Left  07/10/2013   Procedure: IRRIGATION AND DEBRIDEMENT WOUND left wrist  ;  Surgeon: Roseanne Kaufman, MD;  Location: [facility];  Service: Orthopedics;  Laterality: Left;  . LAPAROSCOPIC VAGINAL HYSTERECTOMY  02/24/2010  . LAPAROSCOPY  06/2010   for adhesions  . LEEP     x 2  . LUMBAR DISC SURGERY  03/23/2003   left L5-S1 discectomy with microdissection  . LUMBAR DISC SURGERY  05/23/2003   redo discectomy L5-S1 with microdissection   Social History   Social History Narrative   Patient is married does not have children   She  works in her Research officer, trade union business   Drinks alcohol regularly and smokes   No current drug use reported   family history includes Colon polyps in her sister; Diabetes in her mother and sister; Hypertension in her mother and  sister; Lung cancer in her maternal grandmother.   Review of Systems As per HPI  Objective:   Physical Exam BP 138/80   Pulse 80   Ht '5\' 2"'$  (1.575 m)   Wt 135 lb (61.2 kg)   BMI 24.69 kg/m  No acute distress Eyes are anicteric Lungs are clear Heart S1-S2 no rubs murmurs or gallops The abdomen is mildly distended with some slight bulging at the flanks I do not detect a fluid wave, the liver is enlarged about 4 fingerbreadths down from the right costal margin firm and tender I do not detect splenomegaly  The right lower extremity is swollen with well-healed surgical scars, there is 1+ edema into the mid pretibial area no edema in the left lower extremity no signs of cellulitis no skin breakdown  Neurologic she is alert noted x3 and there is no asterixis  Data reviewed primary care notes from 2019, labs in the computer from 2019 and as per HPI

## 2017-10-13 NOTE — Patient Instructions (Addendum)
  We have sent the following medications to your pharmacy for you to pick up at your convenience: Lactulose   Please try to stop drinking alcohol as discussed.   You have been scheduled for an abdominal ultrasound at [facility] on 10/21/17 at 10:00AM. Please arrive 20 minutes prior to your appointment for registration. Make certain not to have anything to eat or drink after midnight prior to your appointment. Should you need to reschedule your appointment, please contact radiology at [phone]. This test typically takes about 30 minutes to perform.  We will call you with results and plans.   I appreciate the opportunity to care for you. Silvano Rusk, MD, [facility]

## 2017-10-21 ENCOUNTER — Ambulatory Visit
Admission: RE | Admit: 2017-10-21 | Discharge: 2017-10-21 | Disposition: A | Discharge status: Home / Self Care | Payer: BLUE CROSS/BLUE SHIELD | Source: Ambulatory Visit | Attending: Internal Medicine | Admitting: Internal Medicine

## 2017-10-21 DIAGNOSIS — K709 Alcoholic liver disease, unspecified: Secondary | ICD-10-CM

## 2017-10-21 NOTE — Progress Notes (Signed)
My Chart message re: stay off EtOH (stop) See me in 4 mos

## 2017-10-26 ENCOUNTER — Telehealth: Payer: Self-pay | Admitting: Internal Medicine

## 2017-10-27 ENCOUNTER — Telehealth: Payer: Self-pay | Admitting: Physician Assistant

## 2017-10-27 ENCOUNTER — Telehealth: Payer: Self-pay | Admitting: *Deleted

## 2017-10-27 ENCOUNTER — Encounter (INDEPENDENT_AMBULATORY_CARE_PROVIDER_SITE_OTHER): Payer: Self-pay

## 2017-10-27 MED ORDER — BUSPIRONE HCL 15 MG PO TABS: 15.0000 mg | ORAL_TABLET | Freq: Two times a day (BID) | ORAL | 3 refills | Status: DC

## 2017-10-27 NOTE — Telephone Encounter (Signed)
Spoke with patient at length about the notes below.  She admits to drinking a little bit yesterday to prevent withdrawals.    She states that she is still taking the Buspar.  She is aware that no controlled medications can be called in, and she stated verbal understanding. Advised that patient go to ER for assessment in order to start on additional medications.  Patient is aware that she will have to be monitored on medication.   Discussed inpatient treatment - advised patient that the best way to get off alcohol would be inpatient to remove herself from the ability to get herself alcohol when she begins having  Withdrawal symptom.  Patient stated verbal understanding. States that she knows she needs inpatient treatment but she just doesn't know if that will happen.  Patient went back to discussing medications and again she was advised on ER assessment for monitoring of vitals and withdrawals.  Patient stated she would "think about it."

## 2017-10-27 NOTE — Telephone Encounter (Signed)
Copied from [place] [id number]. Topic: Quick Communication - See Telephone Encounter >> Oct 27, 2017  1:13 PM Boyd Kerbs wrote: CRM for notification.   Pt. Is asking if prescription busPIRone (BUSPAR) 10 MG tablet 60 tablet 3 09/15/2017   Sig - Route: Take 1 tablet (10 mg total) by mouth 2 (two) times daily. - Oral  Sent to pharmacy as: busPIRone (BUSPAR) 10 MG tablet   If Cody would up the prescription to 2 - 10mg  or to 20 mg.  She is out  Please call pt.   See Telephone encounter for: 10/27/17.

## 2017-10-27 NOTE — Telephone Encounter (Signed)
Routing to PCP to advise   Copied from [place] [id number]. Topic: Quick Communication - See Telephone Encounter >> Oct 27, 2017 11:47 AM Antonieta Iba C wrote: CRM for notification. See Telephone encounter for: 10/27/17  Pt called in for further assistance. Pt says that she was told that she has sorosis of the liver and now she is having a lot of anxiety. Pt says that she she was unable to sleep lastnight. Pt would like to know if provider is able to assist her in any way with coping with this news.    Please advise.   CB: [phone]

## 2017-10-27 NOTE — Telephone Encounter (Signed)
Patient has had the diagnosis of alcoholic hepatitis and liver cirrhosis for some time, and has been followed by Gastroenterology for this. I am a little confused as to why this is a recent surprise but I am certainly sorry she is having a hard time with this. In regards to the anxiety, she is on the BuSpar three times daily. She needs to continue this. We can add on another medication for anxiety but the bigger concern, especially after reading her phone call to Dr. Carlean Purl yesterday, is that of withdrawals. She needs assessment in the ER so they can start her on medication and monitor her vital signs that will help her through withdrawals. There are things we can continue outpatient but she needs ER assessment first. She really needs inpatient treatment after this which we have dicussed numerous times but she has declined.   Recommend [facility].

## 2017-10-27 NOTE — Telephone Encounter (Signed)
Patient reports that she is having withdrawal symptoms from alcohol.  She admits to drinking yesterday due to being so shaky.  She is requesting prescription help for alcohol withdrawal. She is advised that she should speak with her primary care and that she may require inpatient help to completely stop drinking. She verbalized understanding and has agreed to contact her primary care for assistance.

## 2017-10-27 NOTE — Telephone Encounter (Signed)
Left message for patient to call back  

## 2017-10-27 NOTE — Telephone Encounter (Signed)
Patient aware and stated verbal understanding

## 2017-10-27 NOTE — Telephone Encounter (Signed)
I will increase to 15 mg BID. I have sent in the Rx.  Again recommend ER assessment to start treatment for alcoholism.

## 2017-11-11 ENCOUNTER — Telehealth: Payer: Self-pay | Admitting: Physician Assistant

## 2017-11-11 NOTE — Telephone Encounter (Signed)
Please advise. I went through pt chart and did not see a refill request for this medication today. Last fill was 10/11/17 #30 with 0

## 2017-11-11 NOTE — Telephone Encounter (Signed)
I don't recall seeing or denying a refill for this. Ok to refill with same quantity. Needs follow-up before further refill.

## 2017-11-11 NOTE — Telephone Encounter (Signed)
Copied from [place] [id number]. Topic: Quick Communication - See Telephone Encounter >> Nov 11, 2017  2:49 PM Aurelio Brash B wrote: CRM for notification. See Telephone encounter for: 11/11/17.  PT called to find out why the refill for celecoxib (CELEBREX) 100 MG capsule  was rejected

## 2017-11-12 ENCOUNTER — Other Ambulatory Visit: Payer: Self-pay

## 2017-11-12 MED ORDER — CELECOXIB 100 MG PO CAPS: 100.0000 mg | ORAL_CAPSULE | Freq: Every day | ORAL | 0 refills | Status: DC

## 2017-11-12 NOTE — Telephone Encounter (Signed)
Called patient and let her know that I have sent in a 30 day supply but she needs to schedule a follow up appointment for further refills. Patient stated that she understood and she will call back to schedule an appointment.

## 2017-11-23 ENCOUNTER — Other Ambulatory Visit: Payer: Self-pay | Admitting: Internal Medicine

## 2017-11-24 NOTE — Telephone Encounter (Signed)
May I refill Sir? 

## 2017-11-25 NOTE — Telephone Encounter (Signed)
Left her a detailed message to call me back and I sent in her generic zofran.

## 2017-11-25 NOTE — Telephone Encounter (Signed)
OK to refill  Please contact her  1) Has she been able to get into a detox program (EtOH) 2) If she will schedule an appointment we can discuss and I might be able to Rx meds if she is willing to try (she was asking before and referred to PCP by Barbera Setters and I sent her a My Chart message that she did not answer.

## 2017-11-26 ENCOUNTER — Telehealth: Payer: Self-pay | Admitting: Internal Medicine

## 2017-11-26 NOTE — Telephone Encounter (Signed)
Pt returned your call from yesterday and would like a call back. °

## 2017-11-26 NOTE — Telephone Encounter (Signed)
Amy Villa set up appointment to discuss getting her help with her ETOH issues.

## 2017-11-26 NOTE — Telephone Encounter (Signed)
Amy Villa called back and set up an appointment, she said she needs help to stop drinking. She said she has not gotten into a detox program as she hurt her ankle and had to have surgery.

## 2017-11-29 ENCOUNTER — Other Ambulatory Visit: Payer: Self-pay | Admitting: Physician Assistant

## 2017-11-30 NOTE — Telephone Encounter (Signed)
Last OV 10/11/17, No Future OV  Last filled 10/11/17, # 60 with 0 refills

## 2017-12-01 ENCOUNTER — Encounter: Payer: Self-pay | Admitting: Internal Medicine

## 2017-12-01 ENCOUNTER — Ambulatory Visit (INDEPENDENT_AMBULATORY_CARE_PROVIDER_SITE_OTHER): Payer: BLUE CROSS/BLUE SHIELD | Admitting: Internal Medicine

## 2017-12-01 ENCOUNTER — Other Ambulatory Visit (INDEPENDENT_AMBULATORY_CARE_PROVIDER_SITE_OTHER): Payer: BLUE CROSS/BLUE SHIELD

## 2017-12-01 VITALS — BP 118/68 | HR 91 | Ht 62.0 in | Wt 138.8 lb

## 2017-12-01 DIAGNOSIS — K7030 Alcoholic cirrhosis of liver without ascites: Secondary | ICD-10-CM

## 2017-12-01 DIAGNOSIS — F1020 Alcohol dependence, uncomplicated: Secondary | ICD-10-CM

## 2017-12-01 DIAGNOSIS — R1011 Right upper quadrant pain: Secondary | ICD-10-CM

## 2017-12-01 DIAGNOSIS — R160 Hepatomegaly, not elsewhere classified: Secondary | ICD-10-CM

## 2017-12-01 LAB — COMPREHENSIVE METABOLIC PANEL
ALBUMIN: 3.8 g/dL (ref 3.5–5.2)
ALT: 15 U/L (ref 0–35)
AST: 26 U/L (ref 0–37)
Alkaline Phosphatase: 147 U/L — ABNORMAL HIGH (ref 39–117)
BUN: 7 mg/dL (ref 6–23)
CALCIUM: 9 mg/dL (ref 8.4–10.5)
CHLORIDE: 101 meq/L (ref 96–112)
CO2: 29 meq/L (ref 19–32)
Creatinine, Ser: 0.53 mg/dL (ref 0.40–1.20)
GFR: 129.04 mL/min (ref >60.00)
Glucose, Bld: 83 mg/dL (ref 70–99)
POTASSIUM: 3.3 meq/L — AB (ref 3.5–5.1)
Sodium: 138 mEq/L (ref 135–145)
Total Bilirubin: 0.6 mg/dL (ref 0.2–1.2)
Total Protein: 7.4 g/dL (ref 6.0–8.3)

## 2017-12-01 LAB — CBC WITH DIFFERENTIAL/PLATELET
BASOS PCT: 1 % (ref 0.0–3.0)
Basophils Absolute: 0.1 10*3/uL (ref 0.0–0.1)
EOS ABS: 0.1 10*3/uL (ref 0.0–0.7)
Eosinophils Relative: 1.9 % (ref 0.0–5.0)
HEMATOCRIT: 32.9 % — AB (ref 36.0–46.0)
HEMOGLOBIN: 11 g/dL — AB (ref 12.0–15.0)
LYMPHS PCT: 22.9 % (ref 12.0–46.0)
Lymphs Abs: 1.3 10*3/uL (ref 0.7–4.0)
MCHC: 33.5 g/dL (ref 30.0–36.0)
MCV: 92.8 fl (ref 78.0–100.0)
MONOS PCT: 8.3 % (ref 3.0–12.0)
Monocytes Absolute: 0.5 10*3/uL (ref 0.1–1.0)
NEUTROS ABS: 3.7 10*3/uL (ref 1.4–7.7)
Neutrophils Relative %: 65.9 % (ref 43.0–77.0)
PLATELETS: 121 10*3/uL — AB (ref 150.0–400.0)
RBC: 3.54 Mil/uL — ABNORMAL LOW (ref 3.87–5.11)
RDW: 18.4 % — ABNORMAL HIGH (ref 11.5–15.5)
WBC: 5.7 10*3/uL (ref 4.0–10.5)

## 2017-12-01 LAB — AMMONIA: Ammonia: 40 umol/L — ABNORMAL HIGH (ref 11–35)

## 2017-12-01 LAB — PROTIME-INR
INR: 1.3 ratio — AB (ref 0.8–1.0)
PROTHROMBIN TIME: 14.8 s — AB (ref 9.6–13.1)

## 2017-12-01 NOTE — Patient Instructions (Signed)
  Your provider has requested that you go to the basement level for lab work before leaving today. Press "B" on the elevator. The lab is located at the first door on the left as you exit the elevator.   We will be back in touch with lab results and plans.   I appreciate the opportunity to care for you. Silvano Rusk, MD, [facility]

## 2017-12-01 NOTE — Progress Notes (Signed)
Amy Villa 52 y.o. May 09, 1966 [id number]  Assessment & Plan:   Encounter Diagnoses  Name Primary?  . Alcoholic cirrhosis of liver without ascites ([place]) Yes  . RUQ pain   . Hepatomegaly   . Alcoholism ([place])    Pain likely from hepatomegaly, cirrhosis and possibkle hepatitis component Check labs today - think all EtOH but will check ANA + CMET, NH3, CBC, INR  She needs PNA and hepatitis vaccines at some point also  She seems willing to stop EtOH - hx sounds like prior DT's and sig w/drawal so I am reluctant to Rx benzodiazepines and think a supervised inpt vs outpt detox best  Will see what I can come up with  RA:[id number], Amy Cole, PA-C   Subjective:   Chief Complaint: RUQ pain  HPI Amy Villa is here c/o of sharp intermittent RUQ poains that radiate to back at times. Since March visit she tried to stop EtOh but got tremulous. She is still drinking beer and wine coolers, hard lemonades. When in detox in past she describes hallucinations and autonomic instability that she does not want to experience again.  Lactulose helps defecation - several loose stools a day - watery at times but makes it to bathroom w/o incontinence.  Wt Readings from Last 3 Encounters:  12/01/17 138 lb 12.8 oz (63 kg)  10/13/17 135 lb (61.2 kg)  10/11/17 138 lb (62.6 kg)    No Known Allergies Current Meds  Medication Sig  . alendronate (FOSAMAX) 70 MG tablet Take 1 tablet (70 mg total) by mouth every 7 (seven) days. Take with a full glass of water on an empty stomach.  . busPIRone (BUSPAR) 15 MG tablet Take 1 tablet (15 mg total) by mouth 2 (two) times daily.  . celecoxib (CELEBREX) 100 MG capsule Take 1 capsule (100 mg total) by mouth daily.  . cyclobenzaprine (FLEXERIL) 10 MG tablet TAKE 1 TABLET(10 MG) BY MOUTH TWICE DAILY AS NEEDED FOR MUSCLE SPASMS  . FLUoxetine (PROZAC) 40 MG capsule Take 1 capsule (40 mg total) by mouth daily.  [name] gabapentin (NEURONTIN) 300 MG capsule Please take 2  capsules each morning and noon, and 3 capsules each evening.  . hydrochlorothiazide (HYDRODIURIL) 25 MG tablet Take 25 mg by mouth daily.  [name] lactulose (CHRONULAC) 10 GM/15ML solution Take 30 mLs (20 g total) by mouth daily.  [name] loratadine (CLARITIN) 10 MG tablet Take 10 mg by mouth daily.  . ondansetron (ZOFRAN-ODT) 4 MG disintegrating tablet DISSOLVE 1 TABLET(4 MG) ON THE TONGUE EVERY 8 HOURS AS NEEDED FOR NAUSEA OR VOMITING  . pantoprazole (PROTONIX) 40 MG tablet Take 1 tablet (40 mg total) daily before breakfast by mouth.  . potassium chloride SA (K-DUR,KLOR-CON) 20 MEQ tablet Take 20 mEq by mouth 2 (two) times daily.  . Vitamin D, Ergocalciferol, (DRISDOL) 50000 units CAPS capsule Take 1 capsule (50,000 Units total) by mouth every 7 (seven) days.   Past Medical History:  Diagnosis Date  . Alcoholic hepatitis   . Aortic atherosclerosis ([place])   . Arthritis    neck  . Carpal tunnel syndrome on both sides 02/2012  . Cigarette nicotine dependence   . Contact lens/glasses fitting    wears contacts or glasses  . Dental crowns present    x 2 upper front  . Elevated LFTs   . Fatty liver   . Fluid retention   . History of MRSA infection   . Hypertension   . Insomnia   . Migraines   . Osteoarthritis   .  Parasomnia   . Wears partial dentures    lower   Past Surgical History:  Procedure Laterality Date  . ANKLE ARTHROSCOPY WITH RECONSTRUCTION  07/2017  . CARPAL TUNNEL RELEASE  03/17/2012   Procedure: CARPAL TUNNEL RELEASE;  Surgeon: Roseanne Kaufman, MD;  Location: [facility];  Service: Orthopedics;  Laterality: Bilateral;  left limited open carpal tunnel release. right carpal tunnel injection with 1cc of celestone and 2cc of marcaine.25%  . CARPAL TUNNEL RELEASE  06/30/2012   Procedure: CARPAL TUNNEL RELEASE;  Surgeon: Roseanne Kaufman, MD;  Location: [facility];  Service: Orthopedics;  Laterality: Right;  RIGHT LIMITED OPEN CARPAL TUNNEL RELEASE  . CARPOMETACARPAL ([facility]) FUSION OF  THUMB Left 06/23/2013   Procedure: LEFT CARPOMETACARPEL ([place]) FUSION OF THUMB WITH AUTOGRAFT FROM RADIUS AND REPAIR AS NECESSARY;  Surgeon: Roseanne Kaufman, MD;  Location: [facility];  Service: Orthopedics;  Laterality: Left;  . COLONOSCOPY  2014  . ESOPHAGOGASTRODUODENOSCOPY  05/2017  . HARDWARE REMOVAL Left 07/10/2013   Procedure: HARDWARE REMOVAL;  Surgeon: Roseanne Kaufman, MD;  Location: [facility];  Service: Orthopedics;  Laterality: Left;  . INCISION AND DRAINAGE OF WOUND Left 07/10/2013   Procedure: IRRIGATION AND DEBRIDEMENT WOUND left wrist  ;  Surgeon: Roseanne Kaufman, MD;  Location: [facility];  Service: Orthopedics;  Laterality: Left;  . LAPAROSCOPIC VAGINAL HYSTERECTOMY  02/24/2010  . LAPAROSCOPY  06/2010   for adhesions  . LEEP     x 2  . LUMBAR DISC SURGERY  03/23/2003   left L5-S1 discectomy with microdissection  . 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   Review of Systems As above  Objective:   Physical Exam BP 118/68   Pulse 91   Ht 5\' 2"  (1.575 m)   Wt 138 lb 12.8 oz (63 kg) Comment: pt in foot boot for broken ankle  BMI 25.39 kg/m  Anicteric NAD WDWN Back NT no CVAT abd mildly protuberant soft w/ hepatmegaly no splenomegaly and tender over liver No edema R leg in boot "prophylactic"  Labs, PCP notes reviewed

## 2017-12-02 ENCOUNTER — Other Ambulatory Visit: Payer: Self-pay | Admitting: Physician Assistant

## 2017-12-03 LAB — ANA: Anti Nuclear Antibody(ANA): NEGATIVE

## 2017-12-07 ENCOUNTER — Other Ambulatory Visit: Payer: Self-pay | Admitting: Physician Assistant

## 2017-12-07 ENCOUNTER — Ambulatory Visit (INDEPENDENT_AMBULATORY_CARE_PROVIDER_SITE_OTHER): Payer: BLUE CROSS/BLUE SHIELD | Admitting: Physician Assistant

## 2017-12-07 ENCOUNTER — Encounter: Payer: Self-pay | Admitting: Physician Assistant

## 2017-12-07 ENCOUNTER — Other Ambulatory Visit: Payer: Self-pay

## 2017-12-07 VITALS — BP 122/84 | HR 103 | Temp 98.7°F | Resp 17 | Ht 62.0 in | Wt 135.6 lb

## 2017-12-07 DIAGNOSIS — F1110 Opioid abuse, uncomplicated: Secondary | ICD-10-CM

## 2017-12-07 DIAGNOSIS — M8589 Other specified disorders of bone density and structure, multiple sites: Secondary | ICD-10-CM | POA: Diagnosis not present

## 2017-12-07 DIAGNOSIS — Z23 Encounter for immunization: Secondary | ICD-10-CM | POA: Diagnosis not present

## 2017-12-07 DIAGNOSIS — M159 Polyosteoarthritis, unspecified: Secondary | ICD-10-CM

## 2017-12-07 DIAGNOSIS — K7030 Alcoholic cirrhosis of liver without ascites: Secondary | ICD-10-CM

## 2017-12-07 DIAGNOSIS — M150 Primary generalized (osteo)arthritis: Secondary | ICD-10-CM | POA: Diagnosis not present

## 2017-12-07 MED ORDER — GABAPENTIN 100 MG PO CAPS: 100.0000 mg | ORAL_CAPSULE | Freq: Three times a day (TID) | ORAL | 3 refills | Status: DC

## 2017-12-07 MED ORDER — CYCLOBENZAPRINE HCL 10 MG PO TABS: ORAL_TABLET | ORAL | 0 refills | Status: DC

## 2017-12-07 NOTE — Addendum Note (Signed)
Addended by: [name] on: 12/07/2017 03:45 PM   Modules accepted: Orders

## 2017-12-07 NOTE — Progress Notes (Signed)
Patient presents to clinic today for follow-up of chronic neuropathic pain and degenerative arthritis. Patient also due for follow-up regarding osteopenia w history of fracture. Patient is currently on a regimen of Gabapentin 600 mg AM, 600 mg Noon and 900 mg at night. Is tolerating well. Notes that pain is much improved with this regimen but could be further improved. Would like to discuss tweaking dose of medication. Patient also due for refill of her Celebrex. Is no longer taking Norco given previously by orthopedist. States she was able to successfully wean off of this without issue. Is still drinking and is working with her GI specialist on outpatient rehabilitation for this. States she is ready to make necessary changes.   Patient also needing Hep A/B and Pneumovax due to liver cirrhosis. Agrees to start today.   Past Medical History:  Diagnosis Date  . Alcoholic hepatitis   . Aortic atherosclerosis ([place])   . Arthritis    neck  . Carpal tunnel syndrome on both sides 02/2012  . Cigarette nicotine dependence   . Contact lens/glasses fitting    wears contacts or glasses  . Dental crowns present    x 2 upper front  . Elevated LFTs   . Fatty liver   . Fluid retention   . History of MRSA infection   . Hypertension   . Insomnia   . Migraines   . Osteoarthritis   . Parasomnia   . Wears partial dentures    lower    Current Outpatient Medications on File Prior to Visit  Medication Sig Dispense Refill  . alendronate (FOSAMAX) 70 MG tablet Take 1 tablet (70 mg total) by mouth every 7 (seven) days. Take with a full glass of water on an empty stomach. 12 tablet 1  . busPIRone (BUSPAR) 15 MG tablet Take 1 tablet (15 mg total) by mouth 2 (two) times daily. 60 tablet 3  . celecoxib (CELEBREX) 100 MG capsule Take 1 capsule (100 mg total) by mouth daily. 30 capsule 0  . cyclobenzaprine (FLEXERIL) 10 MG tablet TAKE 1 TABLET(10 MG) BY MOUTH TWICE DAILY AS NEEDED FOR MUSCLE SPASMS 60 tablet 0    . FLUoxetine (PROZAC) 40 MG capsule Take 1 capsule (40 mg total) by mouth daily. 30 capsule 3  . gabapentin (NEURONTIN) 300 MG capsule Please take 2 capsules each morning and noon, and 3 capsules each evening. 210 capsule 3  . hydrochlorothiazide (HYDRODIURIL) 25 MG tablet Take 25 mg by mouth daily.    [name] lactulose (CHRONULAC) 10 GM/15ML solution Take 30 mLs (20 g total) by mouth daily. 946 mL 3  . loratadine (CLARITIN) 10 MG tablet Take 10 mg by mouth daily.    . meloxicam (MOBIC) 7.5 MG tablet TK 1 T PO BID PRN  0  . ondansetron (ZOFRAN-ODT) 4 MG disintegrating tablet DISSOLVE 1 TABLET(4 MG) ON THE TONGUE EVERY 8 HOURS AS NEEDED FOR NAUSEA OR VOMITING 20 tablet 0  . pantoprazole (PROTONIX) 40 MG tablet Take 1 tablet (40 mg total) daily before breakfast by mouth. 90 tablet 3  . potassium chloride SA (K-DUR,KLOR-CON) 20 MEQ tablet Take 20 mEq by mouth 2 (two) times daily.     No current facility-administered medications on file prior to visit.     No Known Allergies  Family History  Problem Relation Age of Onset  . Diabetes Mother   . Hypertension Mother   . Colon polyps Sister   . Hypertension Sister   . Diabetes Sister   . Lung  cancer Maternal Grandmother   . Colon cancer Neg Hx   . Esophageal cancer Neg Hx   . Pancreatic cancer Neg Hx   . Stomach cancer Neg Hx   . Liver disease Neg Hx     Social History   Socioeconomic History  . Marital status: Married    Spouse name: Not on file  . Number of children: Not on file  . Years of education: Not on file  . Highest education level: Not on file  Occupational History  . Occupation: Education administrator  Social Needs  . Financial resource strain: Not on file  . Food insecurity:    Worry: Not on file    Inability: Not on file  . Transportation needs:    Medical: Not on file    Non-medical: Not on file  Tobacco Use  . Smoking status: Current Every Day Smoker    Packs/day: 0.50    Years: 10.00    Pack years: 5.00    Types:  Cigarettes  . Smokeless tobacco: Never Used  Substance and Sexual Activity  . Alcohol use: Yes    Comment: 2 Mikes hard drinks; now has only occasional as of 11/18/16   . Drug use: No  . Sexual activity: Not on file  Lifestyle  . Physical activity:    Days per week: Not on file    Minutes per session: Not on file  . Stress: Not on file  Relationships  . Social connections:    Talks on phone: Not on file    Gets together: Not on file    Attends religious service: Not on file    Active member of club or organization: Not on file    Attends meetings of clubs or organizations: Not on file    Relationship status: Not on file  Other Topics Concern  . 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  All other ROS are negative.  Ht 5\' 2"  (1.575 m)   Wt 135 lb 9.6 oz (61.5 kg)   BMI 24.80 kg/m   Physical Exam  Constitutional: She appears well-developed and well-nourished.  HENT:  Head: Normocephalic and atraumatic.  Eyes: Pupils are equal, round, and reactive to light.  Cardiovascular: Normal rate and regular rhythm.  Pulmonary/Chest: Effort normal.  Psychiatric: She has a normal mood and affect.  Vitals reviewed.  Recent Results (from the past 2160 hour(s))  PTH, Intact and Calcium     Status: Abnormal   Collection Time: 10/11/17 11:38 AM  Result Value Ref Range   PTH 89 (H) 14 - 64 pg/mL    Comment: . Interpretive Guide    Intact PTH           Calcium ------------------    ----------           ------- Normal Parathyroid    Normal               Normal Hypoparathyroidism    Low or Low Normal    Low Hyperparathyroidism    Primary            Normal or High       High    Secondary          High  Normal or Low    Tertiary           High                  High Non-Parathyroid    Hypercalcemia      Low or Low Normal    High .    Calcium 8.8 8.6 - 10.4 mg/dL  Vitamin D (25 hydroxy)     Status: Abnormal   Collection Time: 10/11/17 11:38 AM  Result Value Ref Range   VITD 19.55 (L) 30.00 - 100.00 ng/mL  ANA     Status: None   Collection Time: 12/01/17 10:17 AM  Result Value Ref Range   Anit Nuclear Antibody(ANA) NEGATIVE NEGATIVE    Comment: ANA IFA is a first line screen for detecting the presence of up to approximately 150 autoantibodies in various autoimmune diseases. A negative ANA IFA result suggests ANA-associated autoimmune diseases are not present at this time. . Visit Physician FAQs for interpretation of all antibodies in the [place], prevalence, and association with diseases at http://education.[facility].com/ FBP/ZWC585 .   Protime-INR     Status: Abnormal   Collection Time: 12/01/17 10:17 AM  Result Value Ref Range   INR 1.3 (H) 0.8 - 1.0 ratio   Prothrombin Time 14.8 (H) 9.6 - 13.1 sec  Ammonia     Status: Abnormal   Collection Time: 12/01/17 10:17 AM  Result Value Ref Range   Ammonia 40 (H) 11 - 35 umol/L  Comprehensive metabolic panel     Status: Abnormal   Collection Time: 12/01/17 10:17 AM  Result Value Ref Range   Sodium 138 135 - 145 mEq/L   Potassium 3.3 (L) 3.5 - 5.1 mEq/L   Chloride 101 96 - 112 mEq/L   CO2 29 19 - 32 mEq/L   Glucose, Bld 83 70 - 99 mg/dL   BUN 7 6 - 23 mg/dL   Creatinine, Ser 0.53 0.40 - 1.20 mg/dL   Total Bilirubin 0.6 0.2 - 1.2 mg/dL   Alkaline Phosphatase 147 (H) 39 - 117 U/L   AST 26 0 - 37 U/L   ALT 15 0 - 35 U/L   Total Protein 7.4 6.0 - 8.3 g/dL   Albumin 3.8 3.5 - 5.2 g/dL   Calcium 9.0 8.4 - 10.5 mg/dL   GFR 129.04 >60.00 mL/min  CBC with Differential/Platelet     Status: Abnormal   Collection Time: 12/01/17 10:17 AM  Result Value Ref Range   WBC 5.7 4.0 - 10.5 K/uL   RBC 3.54 (L) 3.87 - 5.11 Mil/uL   Hemoglobin 11.0 (L) 12.0 - 15.0 g/dL   HCT 32.9 (L) 36.0 -  46.0 %   MCV 92.8 78.0 - 100.0 fl   MCHC 33.5 30.0 - 36.0 g/dL   RDW 18.4 (H) 11.5 - 15.5 %   Platelets 121.0 (L) 150.0 - 400.0 K/uL   Neutrophils Relative % 65.9 43.0 - 77.0 %   Lymphocytes Relative 22.9 12.0 - 46.0 %   Monocytes Relative 8.3 3.0 - 12.0 %   Eosinophils Relative 1.9 0.0 - 5.0 %   Basophils Relative 1.0 0.0 - 3.0 %   Neutro Abs 3.7 1.4 - 7.7 K/uL   Lymphs Abs 1.3 0.7 - 4.0 K/uL   Monocytes Absolute 0.5 0.1 - 1.0 K/uL   Eosinophils Absolute 0.1 0.0 - 0.7 K/uL   Basophils Absolute 0.1 0.0 - 0.1 K/uL    Assessment/Plan: 1. Narcotic abuse ([place]) + history. Was getting Norco from her orthopedist for  an extended period despite history. Has been taken completely off of medication and doing well.   2. Alcoholic cirrhosis of liver without ascites ([place]) Followed by Gastroenterology. Recent labs with stability. Is working with specialist on alcohol cessation. Hep A 1/2 given, Hep B 1/3 given and Pneumovax given today. Immunization schedule reviewed.   3. Primary osteoarthritis involving multiple joints Will alter Gabapentin regimen. Increasing to 700, 700, 1000 for now as she would like to triate slowly. Continue Celebrex. Follow-up with Orthopedics as scheduled.   4. Osteopenia of multiple sites Taking Fosamax as directed. Tolerating well. Will monitor.    Leeanne Rio, PA-C

## 2017-12-07 NOTE — Telephone Encounter (Signed)
Patient is coming in today.  Last filled 11/12/17, # 30 with 0 refills with a note to come in for appt for further refills  FYI

## 2017-12-07 NOTE — Telephone Encounter (Signed)
Patient says PA Hassell Done said he would increase the dosage to 15mg  tablets to take 3x a day instead of 10mg  2x a day. Patient would like the script updated and resent to [facility] in [place]. She has not picked them up since they were sent on 11/30/2017. Please call patient once completed.

## 2017-12-07 NOTE — Patient Instructions (Signed)
-  Please increase Gabapentin to 700 mg AM, 700 mg noon and 1000 mg evening by continuing current regimen of 300 mg capsules and adding on a 100 mg capsule at each dosing. -Call me in a week to let me know how this is helping.   -Continue Celebrex and Flexeril. -Follow-up with Orthopedics as directed/scheduled.   We have updated your pneumonia vaccine and started the 1st dose of your Hep A/B combo vaccines. Will need the second in 2 months.

## 2017-12-07 NOTE — Addendum Note (Signed)
Addended by: Glenetta Borg on: 12/07/2017 02:18 PM   Modules accepted: Orders

## 2017-12-07 NOTE — Telephone Encounter (Signed)
Medication sent.

## 2017-12-08 ENCOUNTER — Ambulatory Visit: Payer: Self-pay | Admitting: *Deleted

## 2017-12-08 NOTE — Telephone Encounter (Signed)
Pt states she is experiencing pain, swelling and redness to the right shoulder since yesterday around 4 pm on yesterday. Pt states  Pt was seen in the office on yesterday and received a PNA vaccine in the right deltoid around 1:30pm.  Pt denies having a rash, itching, or SOB. Pt states that the redness is around the injection site and goes up toward her shoulder.  Pt is currently rating the pain at 7 and stats that she is able to move her arm around a little more than on yesterday but it is still painful. Pt states when she puts her hand up she can barely lift it past her shoulder. Pt has not taken anything over the counter for pain control or swelling. Pt given home care advice and advised to call office is symptoms worsen. Pt verbalized understanding.  Reason for Disposition . Pneumococcal vaccine reactions  Answer Assessment - Initial Assessment Questions 1. SYMPTOMS: "What is the main symptom?" (e.g., redness, swelling, pain)      Swelling up to shoulder, pain  2. ONSET: "When was the vaccine (shot) given?" "How much later did the __________ begin?" (e.g., hours, days ago)      Vaccine was given vaccine around 1:30pm and pt started to experience symptoms around 4pm on yesterday. 3. SEVERITY: "How bad is it?"      Swelling up to shoulder 4. FEVER: "Is there a fever?" If so, ask: "What is it, how was it measured, and when did it start?"      Unknown, pt states she does not feel like she has a fever 5. IMMUNIZATIONS GIVEN: "What shots have you recently received?"     PNA vaccine 6. PAST REACTIONS: "Have you reacted to immunizations before?" If so, ask: "What happened?"     No 7. OTHER SYMPTOMS: "Do you have any other symptoms?"     No  Protocols used: IMMUNIZATION REACTIONS-A-AH

## 2017-12-15 NOTE — Progress Notes (Signed)
Labs were ok except mildly low K level - she should still be on this - may need higher dose from PCP if is I recommend she contact [facility] about alcohol detox programs Think inpatient likely needed given experience she had in past  I can send records if they need [place] too  # is [phone]  Have her set up F/U me in August  Let me know if she has ?

## 2017-12-28 ENCOUNTER — Other Ambulatory Visit: Payer: Self-pay | Admitting: Internal Medicine

## 2017-12-28 NOTE — Telephone Encounter (Signed)
How many refills Sir, she was seen 12/05/17.

## 2018-01-04 ENCOUNTER — Other Ambulatory Visit: Payer: Self-pay | Admitting: Physician Assistant

## 2018-01-16 ENCOUNTER — Other Ambulatory Visit: Payer: Self-pay | Admitting: Physician Assistant

## 2018-01-26 ENCOUNTER — Other Ambulatory Visit: Payer: Self-pay | Admitting: Physician Assistant

## 2018-01-26 ENCOUNTER — Telehealth: Payer: Self-pay | Admitting: Physician Assistant

## 2018-01-26 NOTE — Telephone Encounter (Signed)
Already handled.

## 2018-01-26 NOTE — Telephone Encounter (Signed)
Copied from [place] [id number]. Topic: Quick Communication - Rx Refill/Question >> Jan 26, 2018  9:51 AM Gardiner Ramus wrote: Medication: cyclobenzaprine (FLEXERIL) 10 MG tablet [[id number]]   Preferred Pharmacy (with phone number or street name):  [facility], [place] - [address] [place] [address] OF [place] [address] [phone] (Phone) [phone] (Fax)  Agent: Please be advised that RX refills may take up to 3 business days. We ask that you follow-up with your pharmacy.

## 2018-02-01 ENCOUNTER — Telehealth: Payer: Self-pay | Admitting: Internal Medicine

## 2018-02-01 NOTE — Telephone Encounter (Signed)
Patient requesting help with stopping her alcohol list. She is asking if you are willing to prescribe anything to help her?

## 2018-02-02 NOTE — Telephone Encounter (Signed)
I will discuss with her at 8/7 appointment - if I do Rx anything need to see her in person first and I am away for a week soon and I want to be in town if we do that

## 2018-02-02 NOTE — Telephone Encounter (Signed)
Left message for patient to call back  

## 2018-02-03 NOTE — Telephone Encounter (Signed)
Patient notified

## 2018-02-07 ENCOUNTER — Ambulatory Visit: Payer: BLUE CROSS/BLUE SHIELD

## 2018-02-15 ENCOUNTER — Other Ambulatory Visit: Payer: Self-pay

## 2018-02-15 ENCOUNTER — Other Ambulatory Visit: Payer: Self-pay | Admitting: Physician Assistant

## 2018-02-15 MED ORDER — LACTULOSE 10 GM/15ML PO SOLN: 20.0000 g | Freq: Every day | ORAL | 3 refills | Status: DC

## 2018-02-15 NOTE — Telephone Encounter (Signed)
Lactulose refilled , patient seen in May and has upcoming appointment in August.

## 2018-02-22 ENCOUNTER — Other Ambulatory Visit: Payer: Self-pay | Admitting: Physician Assistant

## 2018-02-22 NOTE — Telephone Encounter (Signed)
Patient is calling to check to see if the office received the request from the pharmacy since she will be out of medication this Thursday 02/24/18. Notified patient that we did.

## 2018-02-22 NOTE — Telephone Encounter (Signed)
Last OV 12/07/17, No showed appt on 02/07/18, No future OV  Last filled 01/26/18, # 90 with 0 refills

## 2018-02-23 ENCOUNTER — Encounter (HOSPITAL_COMMUNITY): Payer: Self-pay | Admitting: Emergency Medicine

## 2018-02-23 ENCOUNTER — Emergency Department (HOSPITAL_COMMUNITY): Payer: BLUE CROSS/BLUE SHIELD

## 2018-02-23 ENCOUNTER — Observation Stay (HOSPITAL_COMMUNITY)
Admission: EM | Admit: 2018-02-23 | Discharge: 2018-02-25 | Disposition: A | Discharge status: Home / Self Care | Payer: BLUE CROSS/BLUE SHIELD | Attending: Family Medicine | Admitting: Family Medicine

## 2018-02-23 ENCOUNTER — Other Ambulatory Visit: Payer: Self-pay

## 2018-02-23 ENCOUNTER — Encounter: Payer: Self-pay | Admitting: Internal Medicine

## 2018-02-23 ENCOUNTER — Ambulatory Visit (INDEPENDENT_AMBULATORY_CARE_PROVIDER_SITE_OTHER): Payer: BLUE CROSS/BLUE SHIELD | Admitting: Internal Medicine

## 2018-02-23 ENCOUNTER — Other Ambulatory Visit (INDEPENDENT_AMBULATORY_CARE_PROVIDER_SITE_OTHER): Payer: BLUE CROSS/BLUE SHIELD

## 2018-02-23 VITALS — BP 124/76 | HR 100 | Ht 62.0 in | Wt 145.0 lb

## 2018-02-23 DIAGNOSIS — G4700 Insomnia, unspecified: Secondary | ICD-10-CM | POA: Diagnosis not present

## 2018-02-23 DIAGNOSIS — F329 Major depressive disorder, single episode, unspecified: Secondary | ICD-10-CM | POA: Diagnosis not present

## 2018-02-23 DIAGNOSIS — G43909 Migraine, unspecified, not intractable, without status migrainosus: Secondary | ICD-10-CM | POA: Insufficient documentation

## 2018-02-23 DIAGNOSIS — K3189 Other diseases of stomach and duodenum: Secondary | ICD-10-CM | POA: Insufficient documentation

## 2018-02-23 DIAGNOSIS — R198 Other specified symptoms and signs involving the digestive system and abdomen: Secondary | ICD-10-CM | POA: Diagnosis not present

## 2018-02-23 DIAGNOSIS — K7030 Alcoholic cirrhosis of liver without ascites: Secondary | ICD-10-CM | POA: Diagnosis not present

## 2018-02-23 DIAGNOSIS — K7290 Hepatic failure, unspecified without coma: Secondary | ICD-10-CM | POA: Insufficient documentation

## 2018-02-23 DIAGNOSIS — K920 Hematemesis: Secondary | ICD-10-CM

## 2018-02-23 DIAGNOSIS — K2970 Gastritis, unspecified, without bleeding: Secondary | ICD-10-CM | POA: Insufficient documentation

## 2018-02-23 DIAGNOSIS — M1990 Unspecified osteoarthritis, unspecified site: Secondary | ICD-10-CM | POA: Diagnosis not present

## 2018-02-23 DIAGNOSIS — J449 Chronic obstructive pulmonary disease, unspecified: Secondary | ICD-10-CM

## 2018-02-23 DIAGNOSIS — I8510 Secondary esophageal varices without bleeding: Secondary | ICD-10-CM | POA: Insufficient documentation

## 2018-02-23 DIAGNOSIS — F419 Anxiety disorder, unspecified: Secondary | ICD-10-CM | POA: Diagnosis not present

## 2018-02-23 DIAGNOSIS — I700 Atherosclerosis of aorta: Secondary | ICD-10-CM | POA: Insufficient documentation

## 2018-02-23 DIAGNOSIS — Z8614 Personal history of Methicillin resistant Staphylococcus aureus infection: Secondary | ICD-10-CM | POA: Insufficient documentation

## 2018-02-23 DIAGNOSIS — F1010 Alcohol abuse, uncomplicated: Secondary | ICD-10-CM | POA: Diagnosis not present

## 2018-02-23 DIAGNOSIS — K922 Gastrointestinal hemorrhage, unspecified: Secondary | ICD-10-CM | POA: Diagnosis not present

## 2018-02-23 DIAGNOSIS — D649 Anemia, unspecified: Secondary | ICD-10-CM | POA: Diagnosis present

## 2018-02-23 DIAGNOSIS — K766 Portal hypertension: Secondary | ICD-10-CM | POA: Diagnosis not present

## 2018-02-23 DIAGNOSIS — K449 Diaphragmatic hernia without obstruction or gangrene: Secondary | ICD-10-CM | POA: Diagnosis not present

## 2018-02-23 DIAGNOSIS — F17210 Nicotine dependence, cigarettes, uncomplicated: Secondary | ICD-10-CM | POA: Insufficient documentation

## 2018-02-23 DIAGNOSIS — F32A Depression, unspecified: Secondary | ICD-10-CM | POA: Diagnosis present

## 2018-02-23 DIAGNOSIS — K2100 Gastro-esophageal reflux disease with esophagitis, without bleeding: Secondary | ICD-10-CM

## 2018-02-23 DIAGNOSIS — F518 Other sleep disorders not due to a substance or known physiological condition: Secondary | ICD-10-CM | POA: Diagnosis not present

## 2018-02-23 DIAGNOSIS — K7460 Unspecified cirrhosis of liver: Secondary | ICD-10-CM | POA: Diagnosis present

## 2018-02-23 DIAGNOSIS — K760 Fatty (change of) liver, not elsewhere classified: Secondary | ICD-10-CM | POA: Diagnosis present

## 2018-02-23 DIAGNOSIS — I10 Essential (primary) hypertension: Secondary | ICD-10-CM | POA: Diagnosis present

## 2018-02-23 LAB — CBC
HCT: 23.8 % — ABNORMAL LOW (ref 36.0–46.0)
Hemoglobin: 7.1 g/dL — ABNORMAL LOW (ref 12.0–15.0)
MCH: 25.5 pg — ABNORMAL LOW (ref 26.0–34.0)
MCHC: 29.8 g/dL — AB (ref 30.0–36.0)
MCV: 85.6 fL (ref 78.0–100.0)
PLATELETS: 219 10*3/uL (ref 150–400)
RBC: 2.78 MIL/uL — AB (ref 3.87–5.11)
RDW: 18.4 % — ABNORMAL HIGH (ref 11.5–15.5)
WBC: 9.4 10*3/uL (ref 4.0–10.5)

## 2018-02-23 LAB — BASIC METABOLIC PANEL
Anion gap: 9 (ref 5–15)
BUN: 10 mg/dL (ref 6–20)
CALCIUM: 9.6 mg/dL (ref 8.9–10.3)
CO2: 27 mmol/L (ref 22–32)
CREATININE: 0.61 mg/dL (ref 0.44–1.00)
Chloride: 103 mmol/L (ref 98–111)
GFR calc non Af Amer: >60 mL/min (ref >60)
Glucose, Bld: 102 mg/dL — ABNORMAL HIGH (ref 70–99)
Potassium: 3.6 mmol/L (ref 3.5–5.1)
Sodium: 139 mmol/L (ref 135–145)

## 2018-02-23 LAB — MAGNESIUM: MAGNESIUM: 2.3 mg/dL (ref 1.7–2.4)

## 2018-02-23 LAB — COMPREHENSIVE METABOLIC PANEL
ALT: 19 U/L (ref 0–35)
AST: 31 U/L (ref 0–37)
Albumin: 4.3 g/dL (ref 3.5–5.2)
Alkaline Phosphatase: 157 U/L — ABNORMAL HIGH (ref 39–117)
BUN: 7 mg/dL (ref 6–23)
CALCIUM: 9.6 mg/dL (ref 8.4–10.5)
CHLORIDE: 103 meq/L (ref 96–112)
CO2: 26 meq/L (ref 19–32)
CREATININE: 0.54 mg/dL (ref 0.40–1.20)
GFR: 126.17 mL/min (ref >60.00)
GLUCOSE: 112 mg/dL — AB (ref 70–99)
Potassium: 4.2 mEq/L (ref 3.5–5.1)
Sodium: 136 mEq/L (ref 135–145)
Total Bilirubin: 0.6 mg/dL (ref 0.2–1.2)
Total Protein: 7.8 g/dL (ref 6.0–8.3)

## 2018-02-23 LAB — CBC WITH DIFFERENTIAL/PLATELET
BASOS PCT: 0.8 % (ref 0.0–3.0)
Basophils Absolute: 0.1 10*3/uL (ref 0.0–0.1)
Eosinophils Absolute: 0.1 10*3/uL (ref 0.0–0.7)
Eosinophils Relative: 1.1 % (ref 0.0–5.0)
Hemoglobin: 7.1 g/dL — CL (ref 12.0–15.0)
LYMPHS ABS: 1.2 10*3/uL (ref 0.7–4.0)
Lymphocytes Relative: 14.5 % (ref 12.0–46.0)
MCHC: 31.6 g/dL (ref 30.0–36.0)
MCV: 82 fl (ref 78.0–100.0)
MONOS PCT: 10.7 % (ref 3.0–12.0)
Monocytes Absolute: 0.9 10*3/uL (ref 0.1–1.0)
NEUTROS ABS: 5.9 10*3/uL (ref 1.4–7.7)
Neutrophils Relative %: 72.9 % (ref 43.0–77.0)
PLATELETS: 187 10*3/uL (ref 150.0–400.0)
RBC: 2.72 Mil/uL — ABNORMAL LOW (ref 3.87–5.11)
RDW: 20.4 % — AB (ref 11.5–15.5)
WBC: 8.1 10*3/uL (ref 4.0–10.5)

## 2018-02-23 LAB — ABO/RH: ABO/RH(D): A NEG

## 2018-02-23 LAB — PROTIME-INR
INR: 1.3 ratio — ABNORMAL HIGH (ref 0.8–1.0)
INR: 1.16
Prothrombin Time: 14.7 s — ABNORMAL HIGH (ref 9.6–13.1)
Prothrombin Time: 14.7 seconds (ref 11.4–15.2)

## 2018-02-23 LAB — URINALYSIS, ROUTINE W REFLEX MICROSCOPIC
BILIRUBIN URINE: NEGATIVE
Glucose, UA: NEGATIVE mg/dL
HGB URINE DIPSTICK: NEGATIVE
KETONES UR: NEGATIVE mg/dL
Leukocytes, UA: NEGATIVE
Nitrite: NEGATIVE
PROTEIN: NEGATIVE mg/dL
Specific Gravity, Urine: 1.002 — ABNORMAL LOW (ref 1.005–1.030)
pH: 7 (ref 5.0–8.0)

## 2018-02-23 LAB — CBG MONITORING, ED: GLUCOSE-CAPILLARY: 102 mg/dL — AB (ref 70–99)

## 2018-02-23 LAB — PHOSPHORUS: Phosphorus: 4.1 mg/dL (ref 2.5–4.6)

## 2018-02-23 LAB — MRSA PCR SCREENING: MRSA BY PCR: NEGATIVE

## 2018-02-23 LAB — PREPARE RBC (CROSSMATCH)

## 2018-02-23 MED ORDER — TRAMADOL HCL 50 MG PO TABS
50.0000 mg | ORAL_TABLET | Freq: Four times a day (QID) | ORAL | Status: DC | PRN
  Administered 2018-02-23 – 2018-02-24 (×2): 50 mg via ORAL
  Filled 2018-02-23 (×2): qty 1

## 2018-02-23 MED ORDER — PANTOPRAZOLE SODIUM 40 MG PO TBEC
40.0000 mg | DELAYED_RELEASE_TABLET | Freq: Two times a day (BID) | ORAL | Status: DC
  Administered 2018-02-24 – 2018-02-25 (×3): 40 mg via ORAL
  Filled 2018-02-23 (×3): qty 1

## 2018-02-23 MED ORDER — SODIUM CHLORIDE 0.9 % IV SOLN: 10.0000 mL/h | Freq: Once | INTRAVENOUS | Status: DC

## 2018-02-23 MED ORDER — ADULT MULTIVITAMIN W/MINERALS CH
1.0000 | ORAL_TABLET | Freq: Every day | ORAL | Status: DC
  Administered 2018-02-23 – 2018-02-25 (×2): 1 via ORAL
  Filled 2018-02-23 (×2): qty 1

## 2018-02-23 MED ORDER — ACETAMINOPHEN 325 MG PO TABS: 650.0000 mg | ORAL_TABLET | Freq: Four times a day (QID) | ORAL | Status: DC | PRN

## 2018-02-23 MED ORDER — GABAPENTIN 300 MG PO CAPS
900.0000 mg | ORAL_CAPSULE | Freq: Every day | ORAL | Status: DC
Start: 2018-02-23 — End: 2018-02-25
  Administered 2018-02-23 – 2018-02-24 (×2): 900 mg via ORAL
  Filled 2018-02-23 (×2): qty 3

## 2018-02-23 MED ORDER — POTASSIUM CHLORIDE CRYS ER 20 MEQ PO TBCR
20.0000 meq | EXTENDED_RELEASE_TABLET | Freq: Two times a day (BID) | ORAL | Status: DC
  Administered 2018-02-23 – 2018-02-25 (×4): 20 meq via ORAL
  Filled 2018-02-23 (×4): qty 1

## 2018-02-23 MED ORDER — ALENDRONATE SODIUM 70 MG PO TABS: ORAL_TABLET | ORAL | 0 refills | Status: DC

## 2018-02-23 MED ORDER — THIAMINE HCL 100 MG/ML IJ SOLN: 100.0000 mg | Freq: Every day | INTRAMUSCULAR | Status: DC

## 2018-02-23 MED ORDER — ONDANSETRON HCL 4 MG/2ML IJ SOLN: 4.0000 mg | Freq: Four times a day (QID) | INTRAMUSCULAR | Status: DC | PRN

## 2018-02-23 MED ORDER — BUSPIRONE HCL 5 MG PO TABS
15.0000 mg | ORAL_TABLET | Freq: Two times a day (BID) | ORAL | Status: DC
  Administered 2018-02-23 – 2018-02-25 (×4): 15 mg via ORAL
  Filled 2018-02-23 (×4): qty 3

## 2018-02-23 MED ORDER — CEFTRIAXONE SODIUM 2 G IJ SOLR
2.0000 g | INTRAMUSCULAR | Status: DC
  Administered 2018-02-24: 2 g via INTRAVENOUS
  Filled 2018-02-23: qty 2

## 2018-02-23 MED ORDER — GABAPENTIN 300 MG PO CAPS
600.0000 mg | ORAL_CAPSULE | Freq: Every day | ORAL | Status: DC
  Administered 2018-02-24 – 2018-02-25 (×2): 600 mg via ORAL
  Filled 2018-02-23 (×2): qty 2

## 2018-02-23 MED ORDER — SODIUM CHLORIDE 0.9 % IV SOLN
INTRAVENOUS | Status: DC | PRN
  Administered 2018-02-23: 250 mL via INTRAVENOUS

## 2018-02-23 MED ORDER — CYCLOBENZAPRINE HCL 10 MG PO TABS
10.0000 mg | ORAL_TABLET | Freq: Every day | ORAL | Status: DC
  Administered 2018-02-23 – 2018-02-24 (×2): 10 mg via ORAL
  Filled 2018-02-23 (×2): qty 1

## 2018-02-23 MED ORDER — VITAMIN B-1 100 MG PO TABS
100.0000 mg | ORAL_TABLET | Freq: Every day | ORAL | Status: DC
  Administered 2018-02-23 – 2018-02-25 (×2): 100 mg via ORAL
  Filled 2018-02-23 (×2): qty 1

## 2018-02-23 MED ORDER — CEFTRIAXONE SODIUM 1 G IJ SOLR
1.0000 g | INTRAMUSCULAR | Status: DC
  Administered 2018-02-23: 1 g via INTRAVENOUS
  Filled 2018-02-23: qty 10

## 2018-02-23 MED ORDER — FOLIC ACID 1 MG PO TABS
1.0000 mg | ORAL_TABLET | Freq: Every day | ORAL | Status: DC
  Administered 2018-02-23 – 2018-02-25 (×2): 1 mg via ORAL
  Filled 2018-02-23 (×2): qty 1

## 2018-02-23 MED ORDER — FLUOXETINE HCL 20 MG PO CAPS
40.0000 mg | ORAL_CAPSULE | Freq: Every day | ORAL | Status: DC
  Administered 2018-02-23 – 2018-02-25 (×3): 40 mg via ORAL
  Filled 2018-02-23 (×3): qty 2

## 2018-02-23 MED ORDER — LORAZEPAM 1 MG PO TABS: 1.0000 mg | ORAL_TABLET | Freq: Four times a day (QID) | ORAL | Status: DC | PRN

## 2018-02-23 MED ORDER — GABAPENTIN 300 MG PO CAPS: 300.0000 mg | ORAL_CAPSULE | Freq: Three times a day (TID) | ORAL | Status: DC

## 2018-02-23 MED ORDER — LORAZEPAM 2 MG/ML IJ SOLN: 1.0000 mg | Freq: Four times a day (QID) | INTRAMUSCULAR | Status: DC | PRN

## 2018-02-23 MED ORDER — PANTOPRAZOLE SODIUM 40 MG PO TBEC: 40.0000 mg | DELAYED_RELEASE_TABLET | Freq: Two times a day (BID) | ORAL | 3 refills | Status: DC

## 2018-02-23 MED ORDER — ONDANSETRON HCL 4 MG PO TABS: 4.0000 mg | ORAL_TABLET | Freq: Four times a day (QID) | ORAL | Status: DC | PRN

## 2018-02-23 MED ORDER — SODIUM CHLORIDE 0.9 % IV SOLN
1.0000 g | Freq: Once | INTRAVENOUS | Status: AC
  Administered 2018-02-23: 1 g via INTRAVENOUS
  Filled 2018-02-23: qty 1

## 2018-02-23 MED ORDER — ACETAMINOPHEN 650 MG RE SUPP: 650.0000 mg | Freq: Four times a day (QID) | RECTAL | Status: DC | PRN

## 2018-02-23 NOTE — H&P (Addendum)
History and Physical    Amy Villa IRC:[id number] DOB: Dec 03, 1965 DOA: 02/23/2018  PCP: [name], PA-C   Patient coming from: Home   Chief Complaint: Fatigue, low hemoglobin  HPI: Amy Villa is a 52 y.o. female with medical history significant for alcoholic liver cirrhosis, HTN, anxiety and depression, who presented to the ED with complaints of fatigue, weakness, shortness of breath with exertion of 1 week duration.  Patient reports 3 episodes of hematemesis and 2 episodes of dark stools about a week and a half ago, that lasted 1 day.  Since then she has had burning in her chest up to her throat.  Patient denies abdominal pain.  Reports use of celecoxib, 2 tablets daily until she ran out about 5 days ago.  Patient saw her gastroenterologist today, blood work showed hemoglobin of 7.1, patient was called to go to the ED. there was initial concern for retroperitoneal hemorrhage.  Patient reports she fell 3 weeks ago, and had some bruising in her bilateral lower back.  ED Course: T-max 99.6.  Hemoglobin 7.1.  Platelets 187.  CT abdomen and pelvis without contrast-no evidence of retroperitoneal hemorrhage or other sites of acute bleeding, intracardiac blood pool is hypodense relative to the adjacent myocardium consistent with anemia, nodular hepatic contour consistent with clinical history of cirrhosis.  Patient was seen by gastroenterologist, Dr. Carlean Purl, in the ED, per EDP- do not recommend Protonix or octreotide at this time, as he doubts active bleed.    Review of Systems: As per HPI all other systems reviewed and negative  Past Medical History:  Diagnosis Date  . Alcoholic hepatitis   . Aortic atherosclerosis ([place])   . Arthritis    neck  . Carpal tunnel syndrome on both sides 02/2012  . Cigarette nicotine dependence   . Contact lens/glasses fitting    wears contacts or glasses  . Dental crowns present    x 2 upper front  . Elevated LFTs   . Fatty liver   .  Fluid retention   . History of MRSA infection   . Hypertension   . Insomnia   . Migraines   . Osteoarthritis   . Parasomnia   . Wears partial dentures    lower    Past Surgical History:  Procedure Laterality Date  . ANKLE ARTHROSCOPY WITH RECONSTRUCTION  07/2017  . CARPAL TUNNEL RELEASE  03/17/2012   Procedure: CARPAL TUNNEL RELEASE;  Surgeon: Roseanne Kaufman, MD;  Location: [place];  Service: Orthopedics;  Laterality: Bilateral;  left limited open carpal tunnel release. right carpal tunnel injection with 1cc of celestone and 2cc of marcaine.25%  . CARPAL TUNNEL RELEASE  06/30/2012   Procedure: CARPAL TUNNEL RELEASE;  Surgeon: Roseanne Kaufman, MD;  Location: [place];  Service: Orthopedics;  Laterality: Right;  RIGHT LIMITED OPEN CARPAL TUNNEL RELEASE  . CARPOMETACARPAL ([facility]) FUSION OF THUMB Left 06/23/2013   Procedure: LEFT CARPOMETACARPEL ([place]) FUSION OF THUMB WITH AUTOGRAFT FROM RADIUS AND REPAIR AS NECESSARY;  Surgeon: Roseanne Kaufman, MD;  Location: [place];  Service: Orthopedics;  Laterality: Left;  . COLONOSCOPY  2014  . ESOPHAGOGASTRODUODENOSCOPY  05/2017  . HARDWARE REMOVAL Left 07/10/2013   Procedure: HARDWARE REMOVAL;  Surgeon: Roseanne Kaufman, MD;  Location: [facility];  Service: Orthopedics;  Laterality: Left;  . INCISION AND DRAINAGE OF WOUND Left 07/10/2013   Procedure: IRRIGATION AND DEBRIDEMENT WOUND left wrist  ;  Surgeon: Roseanne Kaufman, MD;  Location: [facility];  Service: Orthopedics;  Laterality:  Left;  . LAPAROSCOPIC VAGINAL HYSTERECTOMY  02/24/2010  . LAPAROSCOPY  06/2010   for adhesions  . LEEP     x 2  . LUMBAR DISC SURGERY  03/23/2003   left L5-S1 discectomy with microdissection  . LUMBAR [place] SURGERY  05/23/2003   redo discectomy L5-S1 with microdissection     reports that she has been smoking cigarettes.  She has a 5.00 pack-year smoking history. She has never used smokeless tobacco. She reports that she drinks alcohol. She reports that  she does not use drugs.  No Known Allergies  Family History  Problem Relation Age of Onset  . Diabetes Mother   . Hypertension Mother   . Colon polyps Sister   . Hypertension Sister   . Diabetes Sister   . Lung cancer Maternal Grandmother   . Colon cancer Neg Hx   . Esophageal cancer Neg Hx   . Pancreatic cancer Neg Hx   . Stomach cancer Neg Hx   . Liver disease Neg Hx     Prior to Admission medications   Medication Sig Start Date End Date Taking? Authorizing Provider  busPIRone (BUSPAR) 15 MG tablet TAKE 1 TABLET(15 MG) BY MOUTH TWICE DAILY 01/17/18  Yes [name], PA-C  celecoxib (CELEBREX) 100 MG capsule TAKE ONE CAPSULE BY MOUTH DAILY 02/15/18  Yes [name], PA-C  cyclobenzaprine (FLEXERIL) 10 MG tablet TAKE 1 TABLET BY MOUTH TWICE DAILY TO THREE TIMES DAILY AS NEEDED FOR MUSCLE SPASMS 02/22/18  Yes [name], PA-C  FLUoxetine (PROZAC) 40 MG capsule Take 1 capsule (40 mg total) by mouth daily. 03/12/17  Yes [name], PA-C  gabapentin (NEURONTIN) 300 MG capsule TAKE 2 CAPSULES BY MOUTH EVERY MORNING AND NOON THEN 3 CAPSULES EVERY EVENING 01/17/18  Yes [name], PA-C  hydrochlorothiazide (HYDRODIURIL) 25 MG tablet Take 25 mg by mouth daily.   Yes [provider]  lactulose (CHRONULAC) 10 GM/15ML solution Take 30 mLs (20 g total) by mouth daily. 02/15/18  Yes [name], MD  loratadine (CLARITIN) 10 MG tablet Take 10 mg by mouth daily.   Yes [provider]  ondansetron (ZOFRAN-ODT) 4 MG disintegrating tablet DISSOLVE 1 TABLET(4 MG) ON THE TONGUE EVERY 8 HOURS AS NEEDED FOR NAUSEA OR VOMITING 12/29/17  Yes [name], MD  pantoprazole (PROTONIX) 40 MG tablet Take 1 tablet (40 mg total) by mouth 2 (two) times daily before a meal. 02/23/18  Yes [name], MD  potassium chloride SA (K-DUR,KLOR-CON) 20 MEQ tablet Take 20 mEq by mouth 2 (two) times daily.   Yes [provider]  alendronate (FOSAMAX) 70 MG tablet  Take with a full glass of water on an empty stomach. STOP FOR NOW 02/23/2018 02/23/18   [name], MD    Physical Exam: Vitals:   02/23/18 1848 02/23/18 1849 02/23/18 1854 02/23/18 1900  BP: (!) 145/97 (!) 148/86 (!) 141/89 138/78  Pulse: 96 94 95 95  Resp: 14 (!) 22 (!) 21 (!) 28  Temp:      TempSrc:      SpO2: 100% 100% 100% 100%  Weight:      Height:        Constitutional: NAD, calm, comfortable Vitals:   02/23/18 1848 02/23/18 1849 02/23/18 1854 02/23/18 1900  BP: (!) 145/97 (!) 148/86 (!) 141/89 138/78  Pulse: 96 94 95 95  Resp: 14 (!) 22 (!) 21 (!) 28  Temp:      TempSrc:  SpO2: 100% 100% 100% 100%  Weight:      Height:       Eyes: PERRL, lids and conjunctivae normal ENMT: Mucous membranes are moist, and pale Posterior pharynx clear of any exudate or lesions. Neck: normal, supple, no masses, no thyromegaly Respiratory: clear to auscultation bilaterally, no wheezing, no crackles. Normal respiratory effort. No accessory muscle use.  Cardiovascular: Regular rate and rhythm, no murmurs / rubs / gallops. No extremity edema. 2+ pedal pulses.  Abdomen: no tenderness, no masses palpated. No hepatosplenomegaly. Bowel sounds positive. Faint bruising bilat lower back, ~5 by ~5 ccm.  Musculoskeletal: no clubbing / cyanosis. No joint deformity upper and lower extremities. Good ROM, no contractures. Normal muscle tone.  Skin: no rashes, lesions, ulcers. No induration Neurologic: CN 2-12 grossly intact. Strength 5/5 in all 4.  Psychiatric: Normal judgment and insight. Alert and oriented x 3. Normal mood.   Labs on Admission: I have personally reviewed following labs and imaging studies  CBC: Recent Labs  Lab 02/23/18 1210 02/23/18 1749  WBC 8.1 9.4  NEUTROABS 5.9  --   HGB 7.1 Repeated and verified X2.* 7.1*  HCT 22.3 Repeated and verified X2.* 23.8*  MCV 82.0 85.6  PLT 187.0 353   Basic Metabolic Panel: Recent Labs  Lab 02/23/18 1210 02/23/18 1749  NA 136 139   K 4.2 3.6  CL 103 103  CO2 26 27  GLUCOSE 112* 102*  BUN 7 10  CREATININE 0.54 0.61  CALCIUM 9.6 9.6   Liver Function Tests: Recent Labs  Lab 02/23/18 1210  AST 31  ALT 19  ALKPHOS 157*  BILITOT 0.6  PROT 7.8  ALBUMIN 4.3   Coagulation Profile: Recent Labs  Lab 02/23/18 1210 02/23/18 1900  INR 1.3* 1.16   CBG: Recent Labs  Lab 02/23/18 1747  GLUCAP 102*    Radiological Exams on Admission: Ct Abdomen Pelvis Wo Contrast  Result Date: 02/23/2018 CLINICAL DATA:  52 year old female with cirrhosis and flank bruising. Evaluate for retroperitoneal hemorrhage. EXAM: CT ABDOMEN AND PELVIS WITHOUT CONTRAST TECHNIQUE: Multidetector CT imaging of the abdomen and pelvis was performed following the standard protocol without IV contrast. COMPARISON:  Prior CT scan of the abdomen and pelvis 11/27/2016 FINDINGS: Lower chest: The visualized lower lungs are clear. The intracardiac blood pool is hypodense relative to the adjacent myocardium consistent with anemia. Unremarkable visualized distal thoracic esophagus. The heart is normal in size. Remote right 9, 10 and 11 rib fractures. Hepatobiliary: Nodular hepatic contour consistent with the clinical history of cirrhosis. The gallbladder is decompressed. Limited evaluation in the absence of intravenous contrast, but no obvious hepatic mass. No intra or extrahepatic biliary ductal dilatation. Pancreas: Unremarkable. No pancreatic ductal dilatation or surrounding inflammatory changes. Spleen: Punctate calcification consistent with old granulomatous disease. The spleen is normal in size. Adrenals/Urinary Tract: Normal adrenal glands. No evidence of hydronephrosis or nephrolithiasis. Ureters and bladder are unremarkable. Stomach/Bowel: No focal bowel wall thickening or evidence of obstruction. Vascular/Lymphatic: Limited evaluation in the absence of intravenous contrast. No evidence of aneurysm. Mild atherosclerotic calcifications in the abdominal aorta.  Reproductive: Surgical changes of prior hysterectomy. No adnexal mass identified. Other: No evidence of retroperitoneal hemorrhage. Small fat containing umbilical hernia. No ascites. Musculoskeletal: No acute fracture or aggressive appearing lytic or blastic osseous lesion. L5-S1 degenerative disc disease. IMPRESSION: 1. No evidence of retroperitoneal hemorrhage or other site of acute bleeding. 2. The intracardiac blood pool is hypodense relative to the adjacent myocardium consistent with anemia. 3. Nodular hepatic contour consistent  with the clinical history of cirrhosis. 4.  Aortic Atherosclerosis (ICD10-170.0) 5. Small umbilical hernia. Electronically Signed   By: Jacqulynn Cadet M.D.   On: 02/23/2018 19:56    EKG: None  Assessment/Plan Active Problems:   Alcohol abuse   Benign essential hypertension   Liver cirrhosis (HCC)   Hepatic steatosis   Anxiety and depression   GI bleed   Acute symptomatic anemia likely secondary to upper GI bleed. Hx of liver cirrhosis, alcohol abuse, celebrex use.  Differentials varices, PUD and alcoholic gastritis. Gi eval in ED, do not recommend Protonix or octreotide at this time, as he doubts active bleed, cover with IV ceftriazone. -Follow-up GI recommendations in the morning, plans for EGD tomorrow -liquid diet, n.p.o. Midnight -Continue home Protonix p.o. 40 twice daily -IV ceftriaxone - Hold HCTZ in setting of GI bleed -Transfuse 1 units ordered in ED, CBC posttransfusion and in a.m.  Liver cirrhosis-CT abdomen negative for ascites.  Patient takes lactulose for constipation but no documentation reports of hepatic encephalopathy. -Hold lactulose for now  Alcohol abuse-reports cut back on alcohol intake about 6 months ago.  Last drink last night.  On average drinks 3-4 regular beers daily.  Calm, no signs of withdrawal. - CIWA , ativan PRN.  -Thiamine folate multivitamins -Check magnesium phosphorus- normal  HTN-stable. -Hold home HCTZ in the  setting of GI bleed - K- 3.6, cont home K supplements   DVT prophylaxis: Scds Code Status: Full Family Communication: None at bedside Disposition Plan: per rounding team Consults called: Gi Admission status: obs, tele   Bethena Roys MD [facility] Pager [phone] From 6PM-2AM.  Otherwise please contact night-coverage [email or web address] Password [email or web address]  02/23/2018, 8:05 PM

## 2018-02-23 NOTE — ED Notes (Signed)
Unable to transport pt at this time due to provider at bedside

## 2018-02-23 NOTE — H&P (View-Only) (Signed)
   Patient seen briefly  CT looks negative for any retroperitoneal bleed though radiology report pending.  I ordered an EGD for tomorrow by Dr. Lyndel Safe and he and Ellouise Newer will see the patient and plan the course.  ,[name], MD, [facility] 02/23/2018 7:51 PM [phone]

## 2018-02-23 NOTE — ED Triage Notes (Signed)
Patient reports she was sent by GI for further evaluation abnormal hemoglobin. States blood work today showed hemoglobin was 7. Patient endorses fatigue and generalized weakness x1 week.

## 2018-02-23 NOTE — ED Notes (Signed)
ED TO INPATIENT HANDOFF REPORT  Name/Age/Gender Amy Villa 52 y.o. female  Code Status Code Status History    Date Active Date Inactive Code Status Order ID Comments User Context   02/11/2013 2007 02/14/2013 1737 Full Code [id number]  Delfin Gant, NP Inpatient   02/10/2013 2217 02/11/2013 2007 Full Code [id number]  Donzetta Matters., MD ED   11/19/2012 2224 11/20/2012 0526 Full Code [id number]  Blanchie Dessert, MD ED      Home/SNF/Other Home  Chief Complaint Low blood count, sent by dr  Level of Care/Admitting Diagnosis ED Disposition    ED Disposition Condition Comment   [place]: [place] [100102]  Level of Care: Telemetry [5]  Admit to tele based on following criteria: Other see comments  Comments: Gi bleed  Diagnosis: GI bleed [630160]  Admitting Physician: Bethena Roys [[id number]]  Attending Physician: Bethena Roys Nessa.Cuff  PT Class (Do Not Modify): Observation [104]  PT Acc Code (Do Not Modify): Observation [10022]       Medical History Past Medical History:  Diagnosis Date  . Alcoholic hepatitis   . Aortic atherosclerosis ([place])   . Arthritis    neck  . Carpal tunnel syndrome on both sides 02/2012  . Cigarette nicotine dependence   . Contact lens/glasses fitting    wears contacts or glasses  . Dental crowns present    x 2 upper front  . Elevated LFTs   . Fatty liver   . Fluid retention   . History of MRSA infection   . Hypertension   . Insomnia   . Migraines   . Osteoarthritis   . Parasomnia   . Wears partial dentures    lower    Allergies No Known Allergies  IV Location/Drains/Wounds Patient Lines/Drains/Airways Status   Active Line/Drains/Airways    Name:   Placement date:   Placement time:   Site:   Days:   Peripheral IV 02/23/18 Left;Distal Forearm   02/23/18    1903    Forearm   less than 1   Peripheral IV 02/23/18 Proximal Forearm   02/23/18    1903    Forearm   less than 1           Labs/Imaging Results for orders placed or performed during the hospital encounter of 02/23/18 (from the past 48 hour(s))  CBG monitoring, ED     Status: Abnormal   Collection Time: 02/23/18  5:47 PM  Result Value Ref Range   Glucose-Capillary 102 (H) 70 - 99 mg/dL  Basic metabolic panel     Status: Abnormal   Collection Time: 02/23/18  5:49 PM  Result Value Ref Range   Sodium 139 135 - 145 mmol/L   Potassium 3.6 3.5 - 5.1 mmol/L   Chloride 103 98 - 111 mmol/L   CO2 27 22 - 32 mmol/L   Glucose, Bld 102 (H) 70 - 99 mg/dL   BUN 10 6 - 20 mg/dL   Creatinine, Ser 0.61 0.44 - 1.00 mg/dL   Calcium 9.6 8.9 - 10.3 mg/dL   GFR calc non Af Amer >60 >60 mL/min   GFR calc Af Amer >60 >60 mL/min    Comment: (NOTE) The eGFR has been calculated using the CKD EPI equation. This calculation has not been validated in all clinical situations. eGFR's persistently <60 mL/min signify possible Chronic Kidney Disease.    Anion gap 9 5 - 15    Comment: Performed at [facility],  [place] [address], [place], [place] [address]  CBC     Status: Abnormal   Collection Time: 02/23/18  5:49 PM  Result Value Ref Range   WBC 9.4 4.0 - 10.5 K/uL   RBC 2.78 (L) 3.87 - 5.11 MIL/uL   Hemoglobin 7.1 (L) 12.0 - 15.0 g/dL   HCT 23.8 (L) 36.0 - 46.0 %   MCV 85.6 78.0 - 100.0 fL   MCH 25.5 (L) 26.0 - 34.0 pg   MCHC 29.8 (L) 30.0 - 36.0 g/dL   RDW 18.4 (H) 11.5 - 15.5 %   Platelets 219 150 - 400 K/uL    Comment: Performed at [facility], [place] [address], [place], [place] [address]  Type and screen [place]     Status: None   Collection Time: 02/23/18  6:59 PM  Result Value Ref Range   ABO/RH(D) A NEG    Antibody Screen NEG    Sample Expiration      02/26/2018 Performed at [facility], [place] [address], [place], [place] [address]   Protime-INR     Status: None   Collection Time: 02/23/18  7:00 PM  Result Value Ref Range    Prothrombin Time 14.7 11.4 - 15.2 seconds   INR 1.16     Comment: Performed at [facility], [place] [address] [place], [place] [address]   Ct Abdomen Pelvis Wo Contrast  Result Date: 02/23/2018 CLINICAL DATA:  52 year old female with cirrhosis and flank bruising. Evaluate for retroperitoneal hemorrhage. EXAM: CT ABDOMEN AND PELVIS WITHOUT CONTRAST TECHNIQUE: Multidetector CT imaging of the abdomen and pelvis was performed following the standard protocol without IV contrast. COMPARISON:  Prior CT scan of the abdomen and pelvis 11/27/2016 FINDINGS: Lower chest: The visualized lower lungs are clear. The intracardiac blood pool is hypodense relative to the adjacent myocardium consistent with anemia. Unremarkable visualized distal thoracic esophagus. The heart is normal in size. Remote right 9, 10 and 11 rib fractures. Hepatobiliary: Nodular hepatic contour consistent with the clinical history of cirrhosis. The gallbladder is decompressed. Limited evaluation in the absence of intravenous contrast, but no obvious hepatic mass. No intra or extrahepatic biliary ductal dilatation. Pancreas: Unremarkable. No pancreatic ductal dilatation or surrounding inflammatory changes. Spleen: Punctate calcification consistent with old granulomatous disease. The spleen is normal in size. Adrenals/Urinary Tract: Normal adrenal glands. No evidence of hydronephrosis or nephrolithiasis. Ureters and bladder are unremarkable. Stomach/Bowel: No focal bowel wall thickening or evidence of obstruction. Vascular/Lymphatic: Limited evaluation in the absence of intravenous contrast. No evidence of aneurysm. Mild atherosclerotic calcifications in the abdominal aorta. Reproductive: Surgical changes of prior hysterectomy. No adnexal mass identified. Other: No evidence of retroperitoneal hemorrhage. Small fat containing umbilical hernia. No ascites. Musculoskeletal: No acute fracture or aggressive appearing lytic or blastic osseous  lesion. L5-S1 degenerative disc disease. IMPRESSION: 1. No evidence of retroperitoneal hemorrhage or other site of acute bleeding. 2. The intracardiac blood pool is hypodense relative to the adjacent myocardium consistent with anemia. 3. Nodular hepatic contour consistent with the clinical history of cirrhosis. 4.  Aortic Atherosclerosis (ICD10-170.0) 5. Small umbilical hernia. Electronically Signed   By: Jacqulynn Cadet M.D.   On: 02/23/2018 19:56    Pending Labs Unresulted Labs (From admission, onward)   Start     Ordered   02/23/18 1951  Prepare RBC  (Adult Blood Administration - PRBC)  Once,   R    Question Answer Comment  # of Units 1 unit   Transfusion  Indications Actively Bleeding / GI Bleed   Number of Units to Keep Ahead 2 units ahead   If emergent release call blood bank Not emergent release      02/23/18 1951   02/23/18 1859  ABO/Rh  Once,   R     02/23/18 1859   02/23/18 1626  Urinalysis, Routine w reflex microscopic  Once,   STAT     02/23/18 1626      Vitals/Pain Today's Vitals   02/23/18 1849 02/23/18 1854 02/23/18 1900 02/23/18 2012  BP: (!) 148/86 (!) 141/89 138/78 (!) 136/91  Pulse: 94 95 95 90  Resp: (!) 22 (!) 21 (!) 28 17  Temp:      TempSrc:      SpO2: 100% 100% 100% 99%  Weight:      Height:      PainSc:        Isolation Precautions No active isolations  Medications Medications  0.9 %  sodium chloride infusion (has no administration in time range)  cefTRIAXone (ROCEPHIN) 1 g in sodium chloride 0.9 % 100 mL IVPB (1 g Intravenous New Bag/Given 02/23/18 2008)    Mobility walks

## 2018-02-23 NOTE — ED Provider Notes (Signed)
[facility] DEPT Provider Note   CSN: [id number] Arrival date & time: 02/23/18  1528     History   Chief Complaint Chief Complaint  Patient presents with  . Abnormal Lab    HPI Amy Villa is a 52 y.o. female.  51yo F w/ PMH including alcoholic cirrhosis who p/w low hemoglobin.  The patient follows with Dr. Carlean Purl, GI, and saw him in the clinic today.  She reported an episode of hematemesis about a week and a half ago with an episode of dark stools that completely resolved and has not reoccurred.  He plan to do an outpatient EGD and ordered lab work.  She was called back to come to the ER because her hemoglobin had dropped to 7.  She denies any current GI bleeding.  She endorses 1 week of generalized weakness.  She reports some associated shortness of breath.  No recent syncopal episode although she states she "blacked out" a month ago.  She states she has been trying to cut back on alcohol recently, had 3 beers yesterday.  No alcohol today.  She denies any abdominal pain, vomiting, or fevers. She endorses sore throat and burning throat/esophagus pain.  The history is provided by the patient.  Abnormal Lab    Past Medical History:  Diagnosis Date  . Alcoholic hepatitis   . Aortic atherosclerosis ([place])   . Arthritis    neck  . Carpal tunnel syndrome on both sides 02/2012  . Cigarette nicotine dependence   . Contact lens/glasses fitting    wears contacts or glasses  . Dental crowns present    x 2 upper front  . Elevated LFTs   . Fatty liver   . Fluid retention   . History of MRSA infection   . Hypertension   . Insomnia   . Migraines   . Osteoarthritis   . Parasomnia   . Wears partial dentures    lower    Patient Active Problem List   Diagnosis Date Noted  . GI bleed 02/23/2018  . Substance abuse ([place]) 03/13/2017  . Aortic atherosclerosis ([place]) 12/01/2016  . Hepatic steatosis 12/01/2016  . Insomnia 12/01/2016  . Anxiety and  depression 12/01/2016  . Liver cirrhosis ([place]) 11/16/2016  . Nicotine dependence 07/11/2013  . DJD (degenerative joint disease) 07/11/2013  . DDD (degenerative disc disease) 07/11/2013  . Anemia 07/11/2013  . Narcotic abuse ([place]) 07/11/2013  . CMC arthritis, thumb, degenerative 06/23/2013  . Alcohol abuse 09/02/2010  . Benign essential hypertension 06/09/2005    Past Surgical History:  Procedure Laterality Date  . ANKLE ARTHROSCOPY WITH RECONSTRUCTION  07/2017  . CARPAL TUNNEL RELEASE  03/17/2012   Procedure: CARPAL TUNNEL RELEASE;  Surgeon: Roseanne Kaufman, MD;  Location: [facility];  Service: Orthopedics;  Laterality: Bilateral;  left limited open carpal tunnel release. right carpal tunnel injection with 1cc of celestone and 2cc of marcaine.25%  . CARPAL TUNNEL RELEASE  06/30/2012   Procedure: CARPAL TUNNEL RELEASE;  Surgeon: Roseanne Kaufman, MD;  Location: [facility];  Service: Orthopedics;  Laterality: Right;  RIGHT LIMITED OPEN CARPAL TUNNEL RELEASE  . CARPOMETACARPAL (CMC) FUSION OF THUMB Left 06/23/2013   Procedure: LEFT CARPOMETACARPEL ([place]) FUSION OF THUMB WITH AUTOGRAFT FROM RADIUS AND REPAIR AS NECESSARY;  Surgeon: Roseanne Kaufman, MD;  Location: [facility];  Service: Orthopedics;  Laterality: Left;  . COLONOSCOPY  2014  . ESOPHAGOGASTRODUODENOSCOPY  05/2017  . HARDWARE REMOVAL Left 07/10/2013   Procedure: HARDWARE REMOVAL;  Surgeon: Roseanne Kaufman, MD;  Location: [facility];  Service: Orthopedics;  Laterality: Left;  . INCISION AND DRAINAGE OF WOUND Left 07/10/2013   Procedure: IRRIGATION AND DEBRIDEMENT WOUND left wrist  ;  Surgeon: Roseanne Kaufman, MD;  Location: [facility];  Service: Orthopedics;  Laterality: Left;  . LAPAROSCOPIC VAGINAL HYSTERECTOMY  02/24/2010  . LAPAROSCOPY  06/2010   for adhesions  . LEEP     x 2  . LUMBAR DISC SURGERY  03/23/2003   left L5-S1 discectomy with microdissection  . LUMBAR DISC SURGERY  05/23/2003   redo discectomy L5-S1 with  microdissection     OB History   None      Home Medications    Prior to Admission medications   Medication Sig Start Date End Date Taking? Authorizing Provider  busPIRone (BUSPAR) 15 MG tablet TAKE 1 TABLET(15 MG) BY MOUTH TWICE DAILY 01/17/18  Yes [name], PA-C  celecoxib (CELEBREX) 100 MG capsule TAKE ONE CAPSULE BY MOUTH DAILY 02/15/18  Yes [name], PA-C  cyclobenzaprine (FLEXERIL) 10 MG tablet TAKE 1 TABLET BY MOUTH TWICE DAILY TO THREE TIMES DAILY AS NEEDED FOR MUSCLE SPASMS 02/22/18  Yes [name], PA-C  FLUoxetine (PROZAC) 40 MG capsule Take 1 capsule (40 mg total) by mouth daily. 03/12/17  Yes [name], PA-C  gabapentin (NEURONTIN) 300 MG capsule TAKE 2 CAPSULES BY MOUTH EVERY MORNING AND NOON THEN 3 CAPSULES EVERY EVENING 01/17/18  Yes [name], PA-C  hydrochlorothiazide (HYDRODIURIL) 25 MG tablet Take 25 mg by mouth daily.   Yes [provider]  lactulose (CHRONULAC) 10 GM/15ML solution Take 30 mLs (20 g total) by mouth daily. 02/15/18  Yes [name], MD  loratadine (CLARITIN) 10 MG tablet Take 10 mg by mouth daily.   Yes [provider]  ondansetron (ZOFRAN-ODT) 4 MG disintegrating tablet DISSOLVE 1 TABLET(4 MG) ON THE TONGUE EVERY 8 HOURS AS NEEDED FOR NAUSEA OR VOMITING 12/29/17  Yes [name], MD  pantoprazole (PROTONIX) 40 MG tablet Take 1 tablet (40 mg total) by mouth 2 (two) times daily before a meal. 02/23/18  Yes [name], MD  potassium chloride SA (K-DUR,KLOR-CON) 20 MEQ tablet Take 20 mEq by mouth 2 (two) times daily.   Yes [provider]  alendronate (FOSAMAX) 70 MG tablet Take with a full glass of water on an empty stomach. STOP FOR NOW 02/23/2018 02/23/18   [name], MD    Family History Family History  Problem Relation Age of Onset  . Diabetes Mother   . Hypertension Mother   . Colon polyps Sister   . Hypertension Sister   . Diabetes Sister   . Lung cancer Maternal  Grandmother   . Colon cancer Neg Hx   . Esophageal cancer Neg Hx   . Pancreatic cancer Neg Hx   . Stomach cancer Neg Hx   . Liver disease Neg Hx     Social History Social History   Tobacco Use  . Smoking status: Current Every Day Smoker    Packs/day: 0.50    Years: 10.00    Pack years: 5.00    Types: Cigarettes  . Smokeless tobacco: Never Used  Substance Use Topics  . Alcohol use: Yes    Comment: 2 [employer] hard drinks; now has only occasional as of 11/18/16   . Drug use: No     Allergies   Patient has no known allergies.   Review of Systems Review of Systems  All other systems reviewed and are negative except that which was mentioned in HPI   Physical Exam Updated Vital Signs BP 138/78   Pulse 95   Temp 99.4 F (37.4 C) (Oral)   Resp (!) 28   Ht 5\' 2"  (1.575 m)   Wt 66.8 kg (147 lb 3 oz)   SpO2 100%   BMI 26.92 kg/m   Physical Exam  Constitutional: She is oriented to person, place, and time. She appears well-developed and well-nourished. No distress.  HENT:  Head: Normocephalic and atraumatic.  Moist mucous membranes  Eyes: Pupils are equal, round, and reactive to light. Conjunctivae are normal.  Neck: Neck supple.  Cardiovascular: Normal rate, regular rhythm and normal heart sounds.  No murmur heard. Pulmonary/Chest: Effort normal and breath sounds normal.  Abdominal: Soft. Bowel sounds are normal. She exhibits distension (mild). There is no tenderness.  Mild RUQ tenderness, mild to moderate ascites  Musculoskeletal: She exhibits no edema.  Neurological: She is alert and oriented to person, place, and time.  Fluent speech  Skin: Skin is warm and dry.  Healing ecchymoses b/l flanks  Psychiatric: She has a normal mood and affect. Judgment normal.  Nursing note and vitals reviewed.    ED Treatments / Results  Labs (all labs ordered are listed, but only abnormal results are displayed) Labs Reviewed  BASIC METABOLIC PANEL - Abnormal; Notable for the  following components:      Result Value   Glucose, Bld 102 (*)    All other components within normal limits  CBC - Abnormal; Notable for the following components:   RBC 2.78 (*)    Hemoglobin 7.1 (*)    HCT 23.8 (*)    MCH 25.5 (*)    MCHC 29.8 (*)    RDW 18.4 (*)    All other components within normal limits  CBG MONITORING, ED - Abnormal; Notable for the following components:   Glucose-Capillary 102 (*)    All other components within normal limits  PROTIME-INR  URINALYSIS, ROUTINE W REFLEX MICROSCOPIC  TYPE AND SCREEN  ABO/RH  PREPARE RBC (CROSSMATCH)    EKG None  Radiology Ct Abdomen Pelvis Wo Contrast  Result Date: 02/23/2018 CLINICAL DATA:  52 year old female with cirrhosis and flank bruising. Evaluate for retroperitoneal hemorrhage. EXAM: CT ABDOMEN AND PELVIS WITHOUT CONTRAST TECHNIQUE: Multidetector CT imaging of the abdomen and pelvis was performed following the standard protocol without IV contrast. COMPARISON:  Prior CT scan of the abdomen and pelvis 11/27/2016 FINDINGS: Lower chest: The visualized lower lungs are clear. The intracardiac blood pool is hypodense relative to the adjacent myocardium consistent with anemia. Unremarkable visualized distal thoracic esophagus. The heart is normal in size. Remote right 9, 10 and 11 rib fractures. Hepatobiliary: Nodular hepatic contour consistent with the clinical history of cirrhosis. The gallbladder is decompressed. Limited evaluation in the absence of intravenous contrast, but no obvious hepatic mass. No intra or extrahepatic biliary ductal dilatation. Pancreas: Unremarkable. No pancreatic ductal dilatation or surrounding inflammatory changes. Spleen: Punctate calcification consistent with old granulomatous disease. The spleen is normal in size. Adrenals/Urinary Tract: Normal adrenal glands. No evidence of hydronephrosis or nephrolithiasis. Ureters and bladder are unremarkable. Stomach/Bowel: No focal bowel wall thickening or evidence  of obstruction. Vascular/Lymphatic: Limited evaluation in the absence of intravenous contrast. No evidence of aneurysm. Mild atherosclerotic calcifications in the abdominal aorta. Reproductive: Surgical changes of prior hysterectomy. No adnexal mass identified. Other: No evidence of retroperitoneal hemorrhage. Small fat containing umbilical hernia. No ascites. Musculoskeletal: No acute  fracture or aggressive appearing lytic or blastic osseous lesion. L5-S1 degenerative disc disease. IMPRESSION: 1. No evidence of retroperitoneal hemorrhage or other site of acute bleeding. 2. The intracardiac blood pool is hypodense relative to the adjacent myocardium consistent with anemia. 3. Nodular hepatic contour consistent with the clinical history of cirrhosis. 4.  Aortic Atherosclerosis (ICD10-170.0) 5. Small umbilical hernia. Electronically Signed   By: Jacqulynn Cadet M.D.   On: 02/23/2018 19:56    Procedures .Critical Care Performed by: Sharlett Iles, MD Authorized by: Sharlett Iles, MD   Critical care provider statement:    Critical care time (minutes):  30   Critical care time was exclusive of:  Separately billable procedures and treating other patients   Critical care was necessary to treat or prevent imminent or life-threatening deterioration of the following conditions: GI bleed, anemia.   Critical care was time spent personally by me on the following activities:  Development of treatment plan with patient or surrogate, discussions with consultants, evaluation of patient's response to treatment, examination of patient, obtaining history from patient or surrogate, ordering and performing treatments and interventions, ordering and review of laboratory studies, ordering and review of radiographic studies, re-evaluation of patient's condition and review of old charts   (including critical care time)  Medications Ordered in ED Medications  0.9 %  sodium chloride infusion (has no  administration in time range)  cefTRIAXone (ROCEPHIN) 1 g in sodium chloride 0.9 % 100 mL IVPB (has no administration in time range)     Initial Impression / Assessment and Plan / ED Course  I have reviewed the triage vital signs and the nursing notes.  Pertinent labs & imaging results that were available during my care of the patient were reviewed by me and considered in my medical decision making (see chart for details).     Patient was comfortable on exam with reassuring vital signs.  Hemoglobin 7.1 here, creatinine normal.  Per Dr. Celesta Aver recommendations, obtained noncontrasted CT to evaluate for retroperitoneal hematoma because of her flank bruises.  CT negative acute.  Ordered patient 1 unit of PRBCs. Dr. Carlean Purl has evaluated patient in the ED and plans for EGD tomorrow.  I have given the patient a dose of ceftriaxone, he has stated that we can hold on octreotide or PPI for now.  Discussed admission with [facility], Dr. Denton Brick, and pt admitted for further care.   Final Clinical Impressions(s) / ED Diagnoses   Final diagnoses:  None    ED Discharge Orders    None       Little, Wenda Overland, MD 02/23/18 2014

## 2018-02-23 NOTE — Progress Notes (Signed)
   Patient seen briefly  CT looks negative for any retroperitoneal bleed though radiology report pending.  I ordered an EGD for tomorrow by Dr. Lyndel Safe and he and Ellouise Newer will see the patient and plan the course.  ,[name], MD, [facility] 02/23/2018 7:51 PM [phone]

## 2018-02-23 NOTE — Patient Instructions (Addendum)
  Your provider has requested that you go to the basement level for lab work before leaving today. Press "B" on the elevator. The lab is located at the first door on the left as you exit the elevator.   Per Dr Carlean Purl hold your Fosamax for now and take your pantoprazole daily.   We are going to investigate how to get you into an alcohol detox program.   You have been scheduled for an endoscopy. Please follow written instructions given to you at your visit today. If you use inhalers (even only as needed), please bring them with you on the day of your procedure.   You have been scheduled for an abdominal ultrasound at [facility] (1st floor of hospital) on 02/28/18 at 2:00pm. Please arrive 15 minutes prior to your appointment for registration. Make certain not to have anything to eat or drink 6 hours prior to your appointment. Should you need to reschedule your appointment, please contact radiology at [phone]. This test typically takes about 30 minutes to perform.  I appreciate the opportunity to care for you. Silvano Rusk, MD, [facility]

## 2018-02-23 NOTE — Progress Notes (Signed)
Amy Villa 52 y.o. April 26, 1966 [id number]  Assessment & Plan:   Encounter Diagnoses  Name Primary?  . Alcoholic cirrhosis, unspecified whether ascites present ([place]) Yes  . Hematemesis without nausea   . Gastroesophageal reflux disease with esophagitis   . Increased abdominal girth     Question if she is having decompensation of her cirrhosis with variceal bleeding, portal gastropathy bleeding or whether she has problems with Fosamax and reflux.  I favor the latter based upon her overall symptom complex.  Her abdominal girth and weight are increased raising the question of ascites.  My plans are: #1 hold Fosamax and take twice daily pantoprazole for the time being #2 schedule complete abdominal ultrasound #3 schedule EGD with possible banding aim for August 14.  At the hospital.  She is instructed to go to the emergency room if she has more bleeding #4 CBC CMET INR today #5 once we get her through this will refer to behavioral health for inpatient detox and rehab  I appreciate the opportunity to care for this patient. CC: [name], PA-C  Lab Results  Component Value Date   WBC 8.1 02/23/2018   HGB 7.1 Repeated and verified X2. (LL) 02/23/2018   HCT 22.3 Repeated and verified X2. (LL) 02/23/2018   MCV 82.0 02/23/2018   PLT 187.0 02/23/2018   Lab Results  Component Value Date   INR 1.3 (H) 02/23/2018   INR 1.3 (H) 12/01/2017   INR 1.2 (H) 11/18/2016     Chemistry      Component Value Date/Time   NA 136 02/23/2018 1210   K 4.2 02/23/2018 1210   CL 103 02/23/2018 1210   CO2 26 02/23/2018 1210   BUN 7 02/23/2018 1210   CREATININE 0.54 02/23/2018 1210      Component Value Date/Time   CALCIUM 9.6 02/23/2018 1210   ALKPHOS 157 (H) 02/23/2018 1210   AST 31 02/23/2018 1210   ALT 19 02/23/2018 1210   BILITOT 0.6 02/23/2018 1210       HGB CAME BACK AT 7 SO WE CALLED HER AND RECOMMENDED SHE GO TO ED.  BASED UPON THAT THINK ADMIT (AT LEAST OBS) AND  EGD TOMORROW AND AFTER THINKING MORE WOULD DO UNENHANCED CT ABD/PELVIS LOOKING FOR RETROPERITONEAL HEMATOMAS GIVEN FLANK BRUISES  I WILL LET MY COLLEAGUES KNOW TO FIND HER  SHE SHOULD BE KEPT NPO AFTER MIDNIGHT  DO NOT THINK SHE NEEDS OCTREOTIDE OR PPI  INFUSION UNLESS SHE IS SHOWING SIGNS OF ACTIVE BLEEDING BUT DID NOT THINK SHE SHOULD WAIT UNTIL MT ORIGINALLY PLANNED EGD NEXT WED GIVEN CHANGES IN HGB  We will cancel outpatient testing that was ordered today  [name], MD, [facility] [id number], [name], PA-C    Subjective:   Chief Complaint: Cirrhosis alcohol, hematemesis  HPI Amy Villa presents today for follow-up of her alcoholic cirrhosis.  She reports that she had a self-limited episode of hematemesis, bright red blood without nausea and melena.  This was for 1 day only.  This was at the end of July and she says she called and spoke to someone here and was told to wait until this visit but there is no documentation of that unfortunately.  She has not had further problems but she is scared about that.  She is having some burning-like chest pain as well.  She was started on Fosamax at the end of June.  She is taking her pantoprazole daily no NSAIDs or aspirin or anything like that.  She  feels that her abdominal girth is increased and she is heavier and indeed our records show that see below.  She is interested in rehab and detox from alcohol.  Right now she is drinking 2 beers a day mostly.  At least that is her report, she says she can go 2 or 3 days without drinking when she does not feel well but that is the most she is done.  We did again revisited that she has had hallucinations while detoxing from alcohol in the past.  She prefers inpatient detox and rehab.  Wt Readings from Last 3 Encounters:  02/23/18 145 lb (65.8 kg)  12/07/17 135 lb 9.6 oz (61.5 kg)  12/01/17 138 lb 12.8 oz (63 kg)    No Known Allergies Current Meds  Medication Sig  . alendronate (FOSAMAX) 70 MG tablet  TAKE 1 TABLET BY MOUTH EVERY 7 DAYS WITH A FULL GLASS OF WATER ON EMPTY STOMACH  . busPIRone (BUSPAR) 15 MG tablet TAKE 1 TABLET(15 MG) BY MOUTH TWICE DAILY  . celecoxib (CELEBREX) 100 MG capsule TAKE ONE CAPSULE BY MOUTH DAILY  . cyclobenzaprine (FLEXERIL) 10 MG tablet TAKE 1 TABLET BY MOUTH TWICE DAILY TO THREE TIMES DAILY AS NEEDED FOR MUSCLE SPASMS  . FLUoxetine (PROZAC) 40 MG capsule Take 1 capsule (40 mg total) by mouth daily.  [name] gabapentin (NEURONTIN) 300 MG capsule TAKE 2 CAPSULES BY MOUTH EVERY MORNING AND NOON THEN 3 CAPSULES EVERY EVENING  . hydrochlorothiazide (HYDRODIURIL) 25 MG tablet Take 25 mg by mouth daily.  [name] lactulose (CHRONULAC) 10 GM/15ML solution Take 30 mLs (20 g total) by mouth daily.  [name] loratadine (CLARITIN) 10 MG tablet Take 10 mg by mouth daily.  . ondansetron (ZOFRAN-ODT) 4 MG disintegrating tablet DISSOLVE 1 TABLET(4 MG) ON THE TONGUE EVERY 8 HOURS AS NEEDED FOR NAUSEA OR VOMITING  . pantoprazole (PROTONIX) 40 MG tablet Take 1 tablet (40 mg total) daily before breakfast by mouth.  . potassium chloride SA (K-DUR,KLOR-CON) 20 MEQ tablet Take 20 mEq by mouth 2 (two) times daily.   Past Medical History:  Diagnosis Date  . Alcoholic hepatitis   . Aortic atherosclerosis ([place])   . Arthritis    neck  . Carpal tunnel syndrome on both sides 02/2012  . Cigarette nicotine dependence   . Contact lens/glasses fitting    wears contacts or glasses  . Dental crowns present    x 2 upper front  . Elevated LFTs   . Fatty liver   . Fluid retention   . History of MRSA infection   . Hypertension   . Insomnia   . Migraines   . Osteoarthritis   . Parasomnia   . Wears partial dentures    lower   Past Surgical History:  Procedure Laterality Date  . ANKLE ARTHROSCOPY WITH RECONSTRUCTION  07/2017  . CARPAL TUNNEL RELEASE  03/17/2012   Procedure: CARPAL TUNNEL RELEASE;  Surgeon: Roseanne Kaufman, MD;  Location: [facility];  Service: Orthopedics;  Laterality:  Bilateral;  left limited open carpal tunnel release. right carpal tunnel injection with 1cc of celestone and 2cc of marcaine.25%  . CARPAL TUNNEL RELEASE  06/30/2012   Procedure: CARPAL TUNNEL RELEASE;  Surgeon: Roseanne Kaufman, MD;  Location: [facility];  Service: Orthopedics;  Laterality: Right;  RIGHT LIMITED OPEN CARPAL TUNNEL RELEASE  . CARPOMETACARPAL ([facility]) FUSION OF THUMB Left 06/23/2013   Procedure: LEFT CARPOMETACARPEL ([place]) FUSION OF THUMB WITH AUTOGRAFT FROM RADIUS AND REPAIR AS NECESSARY;  Surgeon: Roseanne Kaufman, MD;  Location: [facility];  Service: Orthopedics;  Laterality: Left;  . COLONOSCOPY  2014  . ESOPHAGOGASTRODUODENOSCOPY  05/2017  . HARDWARE REMOVAL Left 07/10/2013   Procedure: HARDWARE REMOVAL;  Surgeon: Roseanne Kaufman, MD;  [facility];  Service: Orthopedics;  Laterality: Left;  . INCISION AND DRAINAGE OF WOUND Left 07/10/2013   Procedure: IRRIGATION AND DEBRIDEMENT WOUND left wrist  ;  Surgeon: Roseanne Kaufman, MD;  Location[facility];  Service: Orthopedics;  Laterality: Left;  . LAPAROSCOPIC VAGINAL HYSTERECTOMY  02/24/2010  . LAPAROSCOPY  06/2010   for adhesions  . LEEP     x 2  . LUMBAR DISC SURGERY  03/23/2003   left L5-S1 discectomy with microdissection  . 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   Review of Systems As per HPI  Objective:   Physical Exam BP 124/76   Pulse 100   Ht 5\' 2"  (1.575 m)   Wt 145 lb (65.8 kg)   BMI 26.52 kg/m  Eyes are anicteric Lungs clear Heart normal S1-S2 no murmur Abdomen is protuberant and  seems distended I cannot tell on physical exam if this is ascites or not, once again there are prominent abdominal wall veins and she has a right greater than left flank bruise.  She does not recall falling or bumping into anything but says she could have struck something. Slight trace lower extremity edema no cyanosis or clubbing She is alert and oriented x3 she has good insight and appropriate mood and affect

## 2018-02-23 NOTE — Progress Notes (Signed)
Let her know her blood count is much lower  Double check that no more black stools  Safest thing would be for her to go to ED and be admitted and scoped sooner than next week and I recommend that  If she will not do that then she should check CBC tomorrow AM  Let me know please

## 2018-02-24 ENCOUNTER — Encounter (HOSPITAL_COMMUNITY)
Admission: EM | Disposition: A | Discharge status: Home / Self Care | Payer: Self-pay | Source: Home / Self Care | Attending: Emergency Medicine

## 2018-02-24 ENCOUNTER — Encounter (HOSPITAL_COMMUNITY): Payer: Self-pay | Admitting: *Deleted

## 2018-02-24 ENCOUNTER — Observation Stay (HOSPITAL_COMMUNITY): Payer: BLUE CROSS/BLUE SHIELD | Admitting: Anesthesiology

## 2018-02-24 DIAGNOSIS — I10 Essential (primary) hypertension: Secondary | ICD-10-CM | POA: Diagnosis not present

## 2018-02-24 DIAGNOSIS — F1010 Alcohol abuse, uncomplicated: Secondary | ICD-10-CM | POA: Diagnosis not present

## 2018-02-24 DIAGNOSIS — K3189 Other diseases of stomach and duodenum: Secondary | ICD-10-CM

## 2018-02-24 DIAGNOSIS — K7030 Alcoholic cirrhosis of liver without ascites: Secondary | ICD-10-CM | POA: Diagnosis not present

## 2018-02-24 DIAGNOSIS — K449 Diaphragmatic hernia without obstruction or gangrene: Secondary | ICD-10-CM

## 2018-02-24 DIAGNOSIS — K922 Gastrointestinal hemorrhage, unspecified: Secondary | ICD-10-CM

## 2018-02-24 DIAGNOSIS — K2970 Gastritis, unspecified, without bleeding: Secondary | ICD-10-CM | POA: Diagnosis not present

## 2018-02-24 DIAGNOSIS — K766 Portal hypertension: Secondary | ICD-10-CM | POA: Diagnosis not present

## 2018-02-24 HISTORY — PX: BIOPSY: SHX5522

## 2018-02-24 HISTORY — PX: ESOPHAGOGASTRODUODENOSCOPY (EGD) WITH PROPOFOL: SHX5813

## 2018-02-24 LAB — CBC
HEMATOCRIT: 27.3 % — AB (ref 36.0–46.0)
HEMOGLOBIN: 8.4 g/dL — AB (ref 12.0–15.0)
MCH: 25.9 pg — AB (ref 26.0–34.0)
MCHC: 30.8 g/dL (ref 30.0–36.0)
MCV: 84.3 fL (ref 78.0–100.0)
Platelets: 196 10*3/uL (ref 150–400)
RBC: 3.24 MIL/uL — ABNORMAL LOW (ref 3.87–5.11)
RDW: 17.1 % — ABNORMAL HIGH (ref 11.5–15.5)
WBC: 6.4 10*3/uL (ref 4.0–10.5)

## 2018-02-24 LAB — HIV ANTIBODY (ROUTINE TESTING W REFLEX): HIV SCREEN 4TH GENERATION: NONREACTIVE

## 2018-02-24 SURGERY — ESOPHAGOGASTRODUODENOSCOPY (EGD) WITH PROPOFOL
Anesthesia: Monitor Anesthesia Care

## 2018-02-24 MED ORDER — PROPOFOL 10 MG/ML IV BOLUS
INTRAVENOUS | Status: AC
  Filled 2018-02-24: qty 20

## 2018-02-24 MED ORDER — ORAL CARE MOUTH RINSE: 15.0000 mL | Freq: Two times a day (BID) | OROMUCOSAL | Status: DC

## 2018-02-24 MED ORDER — KETAMINE HCL 10 MG/ML IJ SOLN
INTRAMUSCULAR | Status: DC | PRN
  Administered 2018-02-24: 10 mg via INTRAVENOUS

## 2018-02-24 MED ORDER — PROPOFOL 500 MG/50ML IV EMUL
INTRAVENOUS | Status: DC | PRN
  Administered 2018-02-24: 125 ug/kg/min via INTRAVENOUS
  Administered 2018-02-24: 100 ug/kg/min via INTRAVENOUS

## 2018-02-24 MED ORDER — SODIUM CHLORIDE 0.9 % IV SOLN: INTRAVENOUS | Status: DC

## 2018-02-24 MED ORDER — LACTATED RINGERS IV SOLN
INTRAVENOUS | Status: DC
  Administered 2018-02-24: 14:00:00 via INTRAVENOUS

## 2018-02-24 MED ORDER — KETAMINE HCL 10 MG/ML IJ SOLN
INTRAMUSCULAR | Status: AC
  Filled 2018-02-24: qty 1

## 2018-02-24 MED ORDER — CHLORHEXIDINE GLUCONATE 0.12 % MT SOLN
15.0000 mL | Freq: Two times a day (BID) | OROMUCOSAL | Status: DC
  Administered 2018-02-24: 15 mL via OROMUCOSAL
  Filled 2018-02-24 (×2): qty 15

## 2018-02-24 SURGICAL SUPPLY — 15 items

## 2018-02-24 NOTE — Anesthesia Preprocedure Evaluation (Addendum)
Anesthesia Evaluation  Patient identified by MRN, date of birth, ID band Patient awake    Reviewed: Allergy & Precautions, NPO status , Patient's Chart, lab work & pertinent test results  History of Anesthesia Complications Negative for: history of anesthetic complications  Airway Mallampati: II  TM Distance: >3 FB Neck ROM: Full    Dental  (+) Teeth Intact, Dental Advisory Given   Pulmonary neg pulmonary ROS, Current Smoker,    breath sounds clear to auscultation       Cardiovascular hypertension, Pt. on medications negative cardio ROS   Rhythm:Regular Rate:Normal     Neuro/Psych Anxiety Depression negative neurological ROS     GI/Hepatic negative GI ROS, (+) Cirrhosis     substance abuse  alcohol use,   Endo/Other  negative endocrine ROS  Renal/GU negative Renal ROS     Musculoskeletal negative musculoskeletal ROS (+)   Abdominal   Peds  Hematology  (+) anemia ,   Anesthesia Other Findings   Reproductive/Obstetrics negative OB ROS                            Anesthesia Physical Anesthesia Plan  ASA: II  Anesthesia Plan: MAC   Post-op Pain Management:    Induction: Intravenous  PONV Risk Score and Plan: 1 and Treatment may vary due to age or medical condition and Propofol infusion  Airway Management Planned: Simple Face Mask  Additional Equipment:   Intra-op Plan:   Post-operative Plan:   Informed Consent: I have reviewed the patients History and Physical, chart, labs and discussed the procedure including the risks, benefits and alternatives for the proposed anesthesia with the patient or authorized representative who has indicated his/her understanding and acceptance.   Dental advisory given  Plan Discussed with: CRNA  Anesthesia Plan Comments:        Anesthesia Quick Evaluation

## 2018-02-24 NOTE — Consult Note (Addendum)
Consultation  Referring Provider:  Dr. Patrecia Pour    Primary Care Physician:  [name], PA-C Primary Gastroenterologist:  Dr. Carlean Purl       Reason for Consultation: Melena, Cirrhosis, hematemesis              HPI:   Amy Villa is a 52 y.o. female with a past medical history as listed below including alcoholic cirrhosis, who follows with Dr. Carlean Purl and presented to the ER 02/23/2018 as directed by her outpatient clinic for acute blood loss anemia with history of melena and hematemesis.    Patient was seen in clinic yesterday by Dr. Carlean Purl.  At that time reported self-limited episode of hematemesis of bright red blood without nausea and melena for 1 day.  She has had no further problems since then and this was at the end of July.  Does have some burning-like chest pain.  Was started on Fosamax at the end of June and currently taking her pantoprazole daily with no NSAIDs or aspirin.  Also described that she felt her abdominal girth was increasing and that she was heavier.  Interested in rehab and detox from alcohol and reported drinking 2 beers a day currently.  At that visit patient was told to hold Fosamax and take twice daily pantoprazole for now, schedule complete abdominal ultrasound and EGD with possible banding.  Also had a CBC, CMP and INR.    Hemoglobin came back at 7.1 (previously 11 12/01/2017) and patient was directed to proceed to the ER.  She arrived last night and was seen by Dr. Carlean Purl briefly.  Had a CT which looked negative for retroperitoneal bleeding and an EGD was ordered for today with Dr. Lyndel Safe.    Today, patient reports that her heart monitor has made her uncomfortable.  She has seen no further melena or had any other complaints overnight.  Is aware of procedure today and asked if she may be sent home afterwards pending findings.     Denies fever or chills.  Previous GI procedures: 05/28/2017 EGD with LA grade B reflux esophagitis, erythematous mucosa in  the antrum, small hiatal hernia, portal hypertensive gastropathy-mild and otherwise normal exam. 10/06/2012 colonoscopy 2 sessile polyps measuring 1-2 mm in size in the rectum, small internal and external hemorrhoids, abnormal mucosa in the rectum, otherwise normal with good prep; Path showed benign biopsies and she was placed in 10-year recall  Past Medical History:  Diagnosis Date  . Alcoholic hepatitis   . Aortic atherosclerosis ([place])   . Arthritis    neck  . Carpal tunnel syndrome on both sides 02/2012  . Cigarette nicotine dependence   . Contact lens/glasses fitting    wears contacts or glasses  . Dental crowns present    x 2 upper front  . Elevated LFTs   . Fatty liver   . Fluid retention   . History of MRSA infection   . Hypertension   . Insomnia   . Migraines   . Osteoarthritis   . Parasomnia   . Wears partial dentures    lower    Past Surgical History:  Procedure Laterality Date  . ANKLE ARTHROSCOPY WITH RECONSTRUCTION  07/2017  . CARPAL TUNNEL RELEASE  03/17/2012   Procedure: CARPAL TUNNEL RELEASE;  Surgeon: Roseanne Kaufman, MD;  Location: [place];  Service: Orthopedics;  Laterality: Bilateral;  left limited open carpal tunnel release. right carpal tunnel injection with 1cc of celestone and 2cc of marcaine.25%  .  CARPAL TUNNEL RELEASE  06/30/2012   Procedure: CARPAL TUNNEL RELEASE;  Surgeon: Roseanne Kaufman, MD;  Location: [place];  Service: Orthopedics;  Laterality: Right;  RIGHT LIMITED OPEN CARPAL TUNNEL RELEASE  . CARPOMETACARPAL ([facility]) FUSION OF THUMB Left 06/23/2013   Procedure: LEFT CARPOMETACARPEL ([place]) FUSION OF THUMB WITH AUTOGRAFT FROM RADIUS AND REPAIR AS NECESSARY;  Surgeon: Roseanne Kaufman, MD;  Location: [place];  Service: Orthopedics;  Laterality: Left;  . COLONOSCOPY  2014  . ESOPHAGOGASTRODUODENOSCOPY  05/2017  . HARDWARE REMOVAL Left 07/10/2013   Procedure: HARDWARE REMOVAL;  Surgeon: Roseanne Kaufman, MD;  Location: [facility];  Service: Orthopedics;  Laterality: Left;  . INCISION AND DRAINAGE OF WOUND Left 07/10/2013   Procedure: IRRIGATION AND DEBRIDEMENT WOUND left wrist  ;  Surgeon: Roseanne Kaufman, MD;  Location: [facility];  Service: Orthopedics;  Laterality: Left;  . LAPAROSCOPIC VAGINAL HYSTERECTOMY  02/24/2010  . LAPAROSCOPY  06/2010   for adhesions  . LEEP     x 2  . LUMBAR DISC SURGERY  03/23/2003   left L5-S1 discectomy with microdissection  . LUMBAR DISC SURGERY  05/23/2003   redo discectomy L5-S1 with microdissection    Family History  Problem Relation Age of Onset  . Diabetes Mother   . Hypertension Mother   . Colon polyps Sister   . Hypertension Sister   . Diabetes Sister   . Lung cancer Maternal Grandmother   . Colon cancer Neg Hx   . Esophageal cancer Neg Hx   . Pancreatic cancer Neg Hx   . Stomach cancer Neg Hx   . Liver disease Neg Hx     Social History   Tobacco Use  . Smoking status: Current Every Day Smoker    Packs/day: 0.50    Years: 10.00    Pack years: 5.00    Types: Cigarettes  . Smokeless tobacco: Never Used  Substance Use Topics  . Alcohol use: Yes    Comment: 2 [employer] hard drinks; now has only occasional as of 11/18/16   . Drug use: No    Prior to Admission medications   Medication Sig Start Date End Date Taking? Authorizing Provider  busPIRone (BUSPAR) 15 MG tablet TAKE 1 TABLET(15 MG) BY MOUTH TWICE DAILY 01/17/18  Yes [name], PA-C  celecoxib (CELEBREX) 100 MG capsule TAKE ONE CAPSULE BY MOUTH DAILY 02/15/18  Yes [name], PA-C  cyclobenzaprine (FLEXERIL) 10 MG tablet TAKE 1 TABLET BY MOUTH TWICE DAILY TO THREE TIMES DAILY AS NEEDED FOR MUSCLE SPASMS 02/22/18  Yes [name], PA-C  FLUoxetine (PROZAC) 40 MG capsule Take 1 capsule (40 mg total) by mouth daily. 03/12/17  Yes [name], PA-C  gabapentin (NEURONTIN) 300 MG capsule TAKE 2 CAPSULES BY MOUTH EVERY MORNING AND NOON THEN 3 CAPSULES EVERY EVENING 01/17/18  Yes [name],  PA-C  hydrochlorothiazide (HYDRODIURIL) 25 MG tablet Take 25 mg by mouth daily.   Yes [provider]  lactulose (CHRONULAC) 10 GM/15ML solution Take 30 mLs (20 g total) by mouth daily. 02/15/18  Yes [name], MD  loratadine (CLARITIN) 10 MG tablet Take 10 mg by mouth daily.   Yes [provider]  ondansetron (ZOFRAN-ODT) 4 MG disintegrating tablet DISSOLVE 1 TABLET(4 MG) ON THE TONGUE EVERY 8 HOURS AS NEEDED FOR NAUSEA OR VOMITING 12/29/17  Yes [name], MD  pantoprazole (PROTONIX) 40 MG tablet Take 1 tablet (40 mg total) by mouth 2 (two) times daily before a meal.  02/23/18  Yes [name], MD  potassium chloride SA (K-DUR,KLOR-CON) 20 MEQ tablet Take 20 mEq by mouth 2 (two) times daily.   Yes [provider]  alendronate (FOSAMAX) 70 MG tablet Take with a full glass of water on an empty stomach. STOP FOR NOW 02/23/2018 02/23/18   [name], MD    Current Facility-Administered Medications  Medication Dose Route Frequency Provider Last Rate Last Dose  . 0.9 %  sodium chloride infusion   Intravenous PRN Bethena Roys, MD   Stopped at 02/24/18 0020  . acetaminophen (TYLENOL) tablet 650 mg  650 mg Oral Q6H PRN Emokpae, Ejiroghene E, MD       Or  . acetaminophen (TYLENOL) suppository 650 mg  650 mg Rectal Q6H PRN Emokpae, Ejiroghene E, MD      . busPIRone (BUSPAR) tablet 15 mg  15 mg Oral BID Emokpae, Ejiroghene E, MD   15 mg at 02/23/18 2307  . cefTRIAXone (ROCEPHIN) 2 g in sodium chloride 0.9 % 100 mL IVPB  2 g Intravenous Q24H Emokpae, Ejiroghene E, MD      . chlorhexidine (PERIDEX) 0.12 % solution 15 mL  15 mL Mouth Rinse BID Emokpae, Ejiroghene E, MD      . cyclobenzaprine (FLEXERIL) tablet 10 mg  10 mg Oral QHS Emokpae, Ejiroghene E, MD   10 mg at 02/23/18 2308  . FLUoxetine (PROZAC) capsule 40 mg  40 mg Oral Daily Emokpae, Ejiroghene E, MD   40 mg at 02/23/18 2308  . folic acid (FOLVITE) tablet 1 mg  1 mg Oral Daily Emokpae, Ejiroghene E,  MD   1 mg at 02/23/18 2307  . gabapentin (NEURONTIN) capsule 600 mg  600 mg Oral Daily Emokpae, Ejiroghene E, MD       And  . gabapentin (NEURONTIN) capsule 900 mg  900 mg Oral QHS Emokpae, Ejiroghene E, MD   900 mg at 02/23/18 2307  . LORazepam (ATIVAN) tablet 1 mg  1 mg Oral Q6H PRN Emokpae, Ejiroghene E, MD       Or  . LORazepam (ATIVAN) injection 1 mg  1 mg Intravenous Q6H PRN Emokpae, Ejiroghene E, MD      . MEDLINE mouth rinse  15 mL Mouth Rinse q12n4p Emokpae, Ejiroghene E, MD      . multivitamin with minerals tablet 1 tablet  1 tablet Oral Daily Emokpae, Ejiroghene E, MD   1 tablet at 02/23/18 2307  . ondansetron (ZOFRAN) tablet 4 mg  4 mg Oral Q6H PRN Emokpae, Ejiroghene E, MD       Or  . ondansetron (ZOFRAN) injection 4 mg  4 mg Intravenous Q6H PRN Emokpae, Ejiroghene E, MD      . pantoprazole (PROTONIX) EC tablet 40 mg  40 mg Oral BID AC Emokpae, Ejiroghene E, MD      . potassium chloride SA (K-DUR,KLOR-CON) CR tablet 20 mEq  20 mEq Oral BID Emokpae, Ejiroghene E, MD   20 mEq at 02/23/18 2308  . thiamine (VITAMIN B-1) tablet 100 mg  100 mg Oral Daily Emokpae, Ejiroghene E, MD   100 mg at 02/23/18 2308   Or  . thiamine (B-1) injection 100 mg  100 mg Intravenous Daily Emokpae, Ejiroghene E, MD      . traMADol (ULTRAM) tablet 50 mg  50 mg Oral Q6H PRN Emokpae, Ejiroghene E, MD   50 mg at 02/23/18 2308    Allergies as of 02/23/2018  . (No Known Allergies)     Review  of Systems:    Constitutional: No weight loss, fever or chills Skin: No rash Cardiovascular: No chest pain Respiratory: No SOB  Gastrointestinal: See HPI and otherwise negative Genitourinary: No dysuria  Neurological: No headache, dizziness or syncope Musculoskeletal: No new muscle or joint pain Hematologic: No bruising Psychiatric: No history of depression or anxiety    Physical Exam:  Vital signs in last 24 hours: Temp:  [98.2 F (36.8 C)-100.1 F (37.8 C)] 98.2 F (36.8 C) (08/08 0547) Pulse Rate:   [83-112] 83 (08/08 0547) Resp:  [12-28] 12 (08/08 0547) BP: (122-148)/(68-97) 136/82 (08/08 0547) SpO2:  [94 %-100 %] 96 % (08/08 0547) Weight:  [65.8 kg-66.8 kg] 66.8 kg (08/07 1540) Last BM Date: 02/23/18 General:   Caucasian female appears to be in NAD, Well developed, Well nourished, alert and cooperative Head:  Normocephalic and atraumatic. Eyes:   PEERL, EOMI. No icterus. Conjunctiva pink. Ears:  Normal auditory acuity. Neck:  Supple Throat: Oral cavity and pharynx without inflammation, swelling or lesion. +ttp b/l neck Lungs: Respirations even and unlabored. Lungs clear to auscultation bilaterally.   No wheezes, crackles, or rhonchi.  Heart: Normal S1, S2. No MRG. Regular rate and rhythm. No peripheral edema, cyanosis or pallor.  Abdomen:  Soft, nondistended, nontender. No rebound or guarding. Normal bowel sounds. No appreciable masses or hepatomegaly. Rectal:  Not performed.  Msk:  Symmetrical without gross deformities. Peripheral pulses intact.  Extremities:  Without edema, no deformity or joint abnormality.  Neurologic:  Alert and  oriented x4;  grossly normal neurologically. Skin:   Dry and intact without significant lesions or rashes. Psychiatric: Demonstrates good judgement and reason without abnormal affect or behaviors.   LAB RESULTS: Recent Labs    02/23/18 1210 02/23/18 1749 02/24/18 0255  WBC 8.1 9.4 6.4  HGB 7.1 Repeated and verified X2.* 7.1* 8.4*  HCT 22.3 Repeated and verified X2.* 23.8* 27.3*  PLT 187.0 219 196   BMET Recent Labs    02/23/18 1210 02/23/18 1749  NA 136 139  K 4.2 3.6  CL 103 103  CO2 26 27  GLUCOSE 112* 102*  BUN 7 10  CREATININE 0.54 0.61  CALCIUM 9.6 9.6   LFT Recent Labs    02/23/18 1210  PROT 7.8  ALBUMIN 4.3  AST 31  ALT 19  ALKPHOS 157*  BILITOT 0.6   PT/INR Recent Labs    02/23/18 1210 02/23/18 1900  LABPROT 14.7* 14.7  INR 1.3* 1.16    STUDIES: Ct Abdomen Pelvis Wo Contrast  Result Date:  02/23/2018 CLINICAL DATA:  52 year old female with cirrhosis and flank bruising. Evaluate for retroperitoneal hemorrhage. EXAM: CT ABDOMEN AND PELVIS WITHOUT CONTRAST TECHNIQUE: Multidetector CT imaging of the abdomen and pelvis was performed following the standard protocol without IV contrast. COMPARISON:  Prior CT scan of the abdomen and pelvis 11/27/2016 FINDINGS: Lower chest: The visualized lower lungs are clear. The intracardiac blood pool is hypodense relative to the adjacent myocardium consistent with anemia. Unremarkable visualized distal thoracic esophagus. The heart is normal in size. Remote right 9, 10 and 11 rib fractures. Hepatobiliary: Nodular hepatic contour consistent with the clinical history of cirrhosis. The gallbladder is decompressed. Limited evaluation in the absence of intravenous contrast, but no obvious hepatic mass. No intra or extrahepatic biliary ductal dilatation. Pancreas: Unremarkable. No pancreatic ductal dilatation or surrounding inflammatory changes. Spleen: Punctate calcification consistent with old granulomatous disease. The spleen is normal in size. Adrenals/Urinary Tract: Normal adrenal glands. No evidence of hydronephrosis or nephrolithiasis. Ureters and bladder  are unremarkable. Stomach/Bowel: No focal bowel wall thickening or evidence of obstruction. Vascular/Lymphatic: Limited evaluation in the absence of intravenous contrast. No evidence of aneurysm. Mild atherosclerotic calcifications in the abdominal aorta. Reproductive: Surgical changes of prior hysterectomy. No adnexal mass identified. Other: No evidence of retroperitoneal hemorrhage. Small fat containing umbilical hernia. No ascites. Musculoskeletal: No acute fracture or aggressive appearing lytic or blastic osseous lesion. L5-S1 degenerative disc disease. IMPRESSION: 1. No evidence of retroperitoneal hemorrhage or other site of acute bleeding. 2. The intracardiac blood pool is hypodense relative to the adjacent  myocardium consistent with anemia. 3. Nodular hepatic contour consistent with the clinical history of cirrhosis. 4.  Aortic Atherosclerosis (ICD10-170.0) 5. Small umbilical hernia. Electronically Signed   By: Jacqulynn Cadet M.D.   On: 02/23/2018 19:56    Impression / Plan:   Impression: 1.  History of hematemesis and melena: One episode in July, none since 2.  Acute blood loss anemia: 11 (in May)--> 7.1 02/23/2018--> 1 unit PRBCs--> 8.4 3.  Alcoholic cirrhosis: Still drinking 2 beers a day, would like to go to rehab 4.  Increasing abdominal distention 5.  GERD  Plan: 1.  Patient is scheduled for an EGD this afternoon with Dr. Lyndel Safe.  Did review risks, benefits, limitations and alternatives and patient agrees to proceed. 2.  Continue supportive measures  3.  Agree with twice daily PPI 4.  Please await any further recommendations from Dr. Lyndel Safe later today.  Thank you for your kind consultation, we will continue to follow.  Lavone Nian [facility]  02/24/2018, 8:32 AM   Attending physician's note   I have taken an interval history, reviewed the chart and examined the patient. I agree with the Advanced Practitioner's note, impression and recommendations.   67 year old with Child's Class A ETOH liver cirrhosis (continued alcohol use despite of medical advice) with ? one episode of hematemesis/melena in July, none since, with Hb 7.1(baseline 11 11/2017) s/p 1U to Hb 8.4. CT negative for R/P hematoma. Nl Plts, PT INR, Alb. Prev EGD showed small hiatal hernia and portal hypertensive gastropathy, Neg colon 2014. No NSAIDs. Plan: PPIs. EGD today.  Recheck labs tomorrow.  If with recurrent anemia, repeat colonoscopy as outpatient.  Strict alcohol abstinence. D/w patient and patient's husband.  Carmell Austria, MD

## 2018-02-24 NOTE — Interval H&P Note (Signed)
History and Physical Interval Note:  02/24/2018 3:01 PM  Amy Villa  has presented today for surgery, with the diagnosis of hematemesis and melena, anemia  The various methods of treatment have been discussed with the patient and family. After consideration of risks, benefits and other options for treatment, the patient has consented to  Procedure(s): ESOPHAGOGASTRODUODENOSCOPY (EGD) WITH PROPOFOL (N/A) as a surgical intervention .  The patient's history has been reviewed, patient examined, no change in status, stable for surgery.  I have reviewed the patient's chart and labs.  Questions were answered to the patient's satisfaction.     Jackquline Denmark

## 2018-02-24 NOTE — Progress Notes (Signed)
PROGRESS NOTE [facility]   JEWELS LANGONE   MWN:[id number] DOB: [birth year]  DOA: 02/23/2018 PCP: [name], PA-C   Brief Narrative:  Amy Villa is a 52 year old female with medical history significant for alcohol liver cirrhosis, hypertension, anxiety and depression who presented to the emergency department after being sent by GI and found hemoglobin of 7.1.  Patient has been complaining of weakness, fatigue and shortness of breath with exertion.  Patient reported 3 episodes of hematemesis and 2 episodes of black stools about 2 weeks prior to admission.  Upon ED evaluation hemoglobin was found to be 7.8, CT abdomen pelvis without contrast with no evidence of retroperitoneal hemorrhage or other signs of acute bleeding.  EDP consulted GI who recommended EGD.  Patient was admitted with working diagnosis of acute blood loss anemia.  Subjective: Patient seen and examined, she has no complaints this morning.  Denies nausea/vomiting, no black stools or emesis.  Denies shortness of breath and palpitation.  Patient was transfused 1 unit of PRBCs  Assessment & Plan: Acute blood loss anemia Likely secondary to GI bleed, patient underwent EGD findings of grade 0-1 minimal esophageal varices, mild portal hypertensive gastropathy, small hiatal hernia.  Biopsy was taken.  Resume clear liquid diet.  Check CBC and PT in a.m.  Continue Protonix.  Patient is a status post 1 unit of PRBC, hemoglobin responded well.  Alcoholic liver cirrhosis CT of the abdomen was negative for ascites, no signs of encephalopathy will check ammonia levels.  Patient on home lactulose however she report this is for constipation.  Alcohol abuse Alcohol cessation discussed, continue thiamine and folate.  CIWA protocol.  At this time no signs of alcohol withdrawal  Hypertension BP stable, HCTZ was also continue with GI bleed Continue to monitor and will resume as needed.  Patient might benefit of BB given  esophageal varices  DVT prophylaxis: SCD's Code Status: Full Code Family Communication: None at bedside Disposition Plan: Home in AM if Hgb stable   Consultants:   GI  Procedures:   EGD  Antimicrobials:  None   Objective: Vitals:   02/24/18 1355 02/24/18 1535 02/24/18 1540 02/24/18 1603  BP: (!) 152/94 (!) 121/97 139/74 (!) 158/95  Pulse: 88 (!) 105 (!) 102 91  Resp: 13 14 14    Temp: 99 F (37.2 C) 98.3 F (36.8 C)  98.5 F (36.9 C)  TempSrc: Oral Oral  Oral  SpO2: 100% 99% 95% 100%  Weight: 66.8 kg     Height: 5\' 2"  (1.575 m)       Intake/Output Summary (Last 24 hours) at 02/24/2018 1852 Last data filed at 02/24/2018 1542 Gross per 24 hour  Intake 1062.1 ml  Output -  Net 1062.1 ml   Filed Weights   02/23/18 1540 02/24/18 1355  Weight: 66.8 kg 66.8 kg    Examination:  General exam: Appears calm and comfortable  HEENT: AC/AT, PERRLA, OP moist and clear Respiratory system: Clear to auscultation. No wheezes,crackle or rhonchi Cardiovascular system: S1 & S2 heard, RRR. No JVD, murmurs, rubs or gallops Gastrointestinal system: Abdomen is nondistended, soft and nontender. No organomegaly or masses felt. Normal bowel sounds heard. Central nervous system: Alert and oriented. No focal neurological deficits. Extremities: No pedal edema. Symmetric, strength 5/5   Skin: No rashes, lesions or ulcers Psychiatry: Judgement and insight appear normal. Mood & affect appropriate.    Data Reviewed: I have personally reviewed following labs and imaging studies  CBC: Recent Labs  Lab 02/23/18 1210  02/23/18 1749 02/24/18 0255  WBC 8.1 9.4 6.4  NEUTROABS 5.9  --   --   HGB 7.1 Repeated and verified X2.* 7.1* 8.4*  HCT 22.3 Repeated and verified X2.* 23.8* 27.3*  MCV 82.0 85.6 84.3  PLT 187.0 219 371   Basic Metabolic Panel: Recent Labs  Lab 02/23/18 1210 02/23/18 1749 02/23/18 1900  NA 136 139  --   K 4.2 3.6  --   CL 103 103  --   CO2 26 27  --   GLUCOSE  112* 102*  --   BUN 7 10  --   CREATININE 0.54 0.61  --   CALCIUM 9.6 9.6  --   MG  --   --  2.3  PHOS  --   --  4.1   GFR: Estimated Creatinine Clearance: 74.6 mL/min (by C-G formula based on SCr of 0.61 mg/dL). Liver Function Tests: Recent Labs  Lab 02/23/18 1210  AST 31  ALT 19  ALKPHOS 157*  BILITOT 0.6  PROT 7.8  ALBUMIN 4.3   No results for input(s): LIPASE, AMYLASE in the last 168 hours. No results for input(s): AMMONIA in the last 168 hours. Coagulation Profile: Recent Labs  Lab 02/23/18 1210 02/23/18 1900  INR 1.3* 1.16   Cardiac Enzymes: No results for input(s): CKTOTAL, CKMB, CKMBINDEX, TROPONINI in the last 168 hours. BNP (last 3 results) No results for input(s): PROBNP in the last 8760 hours. HbA1C: No results for input(s): HGBA1C in the last 72 hours. CBG: Recent Labs  Lab 02/23/18 1747  GLUCAP 102*   Lipid Profile: No results for input(s): CHOL, HDL, LDLCALC, TRIG, CHOLHDL, LDLDIRECT in the last 72 hours. Thyroid Function Tests: No results for input(s): TSH, T4TOTAL, FREET4, T3FREE, THYROIDAB in the last 72 hours. Anemia Panel: No results for input(s): VITAMINB12, FOLATE, FERRITIN, TIBC, IRON, RETICCTPCT in the last 72 hours. Sepsis Labs: No results for input(s): PROCALCITON, LATICACIDVEN in the last 168 hours.  Recent Results (from the past 240 hour(s))  MRSA PCR Screening     Status: None   Collection Time: 02/23/18  9:16 PM  Result Value Ref Range Status   MRSA by PCR NEGATIVE NEGATIVE Final    Comment:        The GeneXpert MRSA Assay (FDA approved for NASAL specimens only), is one component of a comprehensive MRSA colonization surveillance program. It is not intended to diagnose MRSA infection nor to guide or monitor treatment for MRSA infections. Performed at [facility], [place] [address], [place], [place] [address]       Radiology Studies: Ct Abdomen Pelvis Wo Contrast  Result Date: 02/23/2018 CLINICAL  DATA:  52 year old female with cirrhosis and flank bruising. Evaluate for retroperitoneal hemorrhage. EXAM: CT ABDOMEN AND PELVIS WITHOUT CONTRAST TECHNIQUE: Multidetector CT imaging of the abdomen and pelvis was performed following the standard protocol without IV contrast. COMPARISON:  Prior CT scan of the abdomen and pelvis 11/27/2016 FINDINGS: Lower chest: The visualized lower lungs are clear. The intracardiac blood pool is hypodense relative to the adjacent myocardium consistent with anemia. Unremarkable visualized distal thoracic esophagus. The heart is normal in size. Remote right 9, 10 and 11 rib fractures. Hepatobiliary: Nodular hepatic contour consistent with the clinical history of cirrhosis. The gallbladder is decompressed. Limited evaluation in the absence of intravenous contrast, but no obvious hepatic mass. No intra or extrahepatic biliary ductal dilatation. Pancreas: Unremarkable. No pancreatic ductal dilatation or surrounding inflammatory changes. Spleen: Punctate calcification consistent with old granulomatous disease. The  spleen is normal in size. Adrenals/Urinary Tract: Normal adrenal glands. No evidence of hydronephrosis or nephrolithiasis. Ureters and bladder are unremarkable. Stomach/Bowel: No focal bowel wall thickening or evidence of obstruction. Vascular/Lymphatic: Limited evaluation in the absence of intravenous contrast. No evidence of aneurysm. Mild atherosclerotic calcifications in the abdominal aorta. Reproductive: Surgical changes of prior hysterectomy. No adnexal mass identified. Other: No evidence of retroperitoneal hemorrhage. Small fat containing umbilical hernia. No ascites. Musculoskeletal: No acute fracture or aggressive appearing lytic or blastic osseous lesion. L5-S1 degenerative disc disease. IMPRESSION: 1. No evidence of retroperitoneal hemorrhage or other site of acute bleeding. 2. The intracardiac blood pool is hypodense relative to the adjacent myocardium consistent with  anemia. 3. Nodular hepatic contour consistent with the clinical history of cirrhosis. 4.  Aortic Atherosclerosis (ICD10-170.0) 5. Small umbilical hernia. Electronically Signed   By: Jacqulynn Cadet M.D.   On: 02/23/2018 19:56      Scheduled Meds: . busPIRone  15 mg Oral BID  . chlorhexidine  15 mL Mouth Rinse BID  . cyclobenzaprine  10 mg Oral QHS  . FLUoxetine  40 mg Oral Daily  . folic acid  1 mg Oral Daily  . gabapentin  600 mg Oral Daily   And  . gabapentin  900 mg Oral QHS  . mouth rinse  15 mL Mouth Rinse q12n4p  . multivitamin with minerals  1 tablet Oral Daily  . pantoprazole  40 mg Oral BID AC  . potassium chloride SA  20 mEq Oral BID  . thiamine  100 mg Oral Daily   Or  . thiamine  100 mg Intravenous Daily   Continuous Infusions: . sodium chloride Stopped (02/24/18 0020)  . cefTRIAXone (ROCEPHIN)  IV    . lactated ringers Stopped (02/24/18 1553)     LOS: 0 days    Time spent: Total of 25 minutes spent with pt, greater than 50% of which was spent in discussion of  treatment, counseling and coordination of care    Chipper Oman, MD Pager: Text Page via [email or web address]   If 7PM-7AM, please contact night-coverage [email or web address] 02/24/2018, 6:52 PM   Note - This record has been created using [employer]. Chart creation errors have been sought, but may not always have been located. Such creation errors do not reflect on the standard of medical care.

## 2018-02-24 NOTE — Transfer of Care (Signed)
Immediate Anesthesia Transfer of Care Note  Patient: Amy Villa  Procedure(s) Performed: Procedure(s): ESOPHAGOGASTRODUODENOSCOPY (EGD) WITH PROPOFOL (N/A) BIOPSY  Patient Location: PACU  Anesthesia Type:MAC  Level of Consciousness:  sedated, patient cooperative and responds to stimulation  Airway & Oxygen Therapy:Patient Spontanous Breathing and Patient connected to face mask oxgen  Post-op Assessment:  Report given to PACU RN and Post -op Vital signs reviewed and stable  Post vital signs:  Reviewed and stable  Last Vitals:  Vitals:   02/24/18 1535 02/24/18 1540  BP: (!) 121/97 139/74  Pulse: (!) 105 (!) 102  Resp: 14 14  Temp: 36.8 C   SpO2: 76% 80%    Complications: No apparent anesthesia complications

## 2018-02-24 NOTE — Op Note (Signed)
[facility] Patient Name: Amy Villa Procedure Date: 02/24/2018 MRN: [id number] Attending MD: Jackquline Denmark , MD Date of Birth: 27-Oct-1965 CSN: [id number] Age: 52 Admit Type: Outpatient Procedure:                Upper GI endoscopy Indications:              Suspected upper gastrointestinal bleeding Providers:                Jackquline Denmark, MD, Cleda Daub, RN, Charolette Child,                            Technician, Anne Fu CRNA, CRNA Referring MD:              Medicines:                Monitored Anesthesia Care Complications:            No immediate complications. Estimated Blood Loss:     Estimated blood loss: none. Procedure:                Pre-Anesthesia Assessment:                           - Prior to the procedure, a History and Physical                            was performed, and patient medications and                            allergies were reviewed. The patient's tolerance of                            previous anesthesia was also reviewed. The risks                            and benefits of the procedure and the sedation                            options and risks were discussed with the patient.                            All questions were answered, and informed consent                            was obtained. Prior Anticoagulants: The patient has                            taken no previous anticoagulant or antiplatelet                            agents. ASA Grade Assessment: II - A patient with                            mild systemic disease. After reviewing the risks  and benefits, the patient was deemed in                            satisfactory condition to undergo the procedure.                           After obtaining informed consent, the endoscope was                            passed under direct vision. Throughout the                            procedure, the patient's blood pressure, pulse, and                         oxygen saturations were monitored continuously. The                            [id number]-H190 ([id number]) Olympus adult endoscope was                            introduced through the mouth, and advanced to the                            second part of duodenum. The upper GI endoscopy was                            accomplished without difficulty. The patient                            tolerated the procedure well. Scope In: Scope Out: Findings:      Grade 0-1 minimal varices were found in the lower third of the esophagus.      A small hiatal hernia was present.      Diffuse mild portal hypertensive gastropathy in the body of the stomach       and in the fundus. No fundal varices. Minimal antral gastritis. Biopsies       were taken with a cold forceps for histology to R/O HP.      The exam was otherwise without abnormality. No active bleeding. Impression:               - Grade 0-I minimal esophageal varices.                           - Small hiatal hernia.                           - Mild portal hypertensive gastropathy.                           - The examination was otherwise normal. No active                            bleeding. Moderate Sedation:      none Recommendation:           - Return  patient to hospital ward for ongoing care.                           - Resume previous diet.                           - Continue present medications.                           - Await pathology results.                           - Return to GI clinic in 4 weeks.                           - Recheck CBC, ammonia and PT in a.m.                           - Nonspecific beta blockers - start in AM.                           - Stop drinking all alcohol. I've discussed above                            with the patient and patient's husband.                           - CT scan was reviewed. No R/P hematoma. Procedure Code(s):        --- Professional ---                           [phone],  Esophagogastroduodenoscopy, flexible,                            transoral; with biopsy, single or multiple Diagnosis Code(s):        --- Professional ---                           I85.00, Esophageal varices without bleeding                           K44.9, Diaphragmatic hernia without obstruction or                            gangrene                           K29.70, Gastritis, unspecified, without bleeding CPT copyright 2017 American Medical Association. All rights reserved. The codes documented in this report are preliminary and upon coder review may  be revised to meet current compliance requirements. Jackquline Denmark, MD 02/24/2018 3:43:13 PM This report has been signed electronically. Number of Addenda: 0

## 2018-02-25 ENCOUNTER — Observation Stay (HOSPITAL_COMMUNITY): Payer: BLUE CROSS/BLUE SHIELD

## 2018-02-25 ENCOUNTER — Encounter (HOSPITAL_COMMUNITY): Payer: Self-pay | Admitting: Gastroenterology

## 2018-02-25 ENCOUNTER — Telehealth: Payer: Self-pay

## 2018-02-25 DIAGNOSIS — K922 Gastrointestinal hemorrhage, unspecified: Secondary | ICD-10-CM | POA: Diagnosis not present

## 2018-02-25 DIAGNOSIS — K7030 Alcoholic cirrhosis of liver without ascites: Secondary | ICD-10-CM | POA: Diagnosis not present

## 2018-02-25 DIAGNOSIS — F419 Anxiety disorder, unspecified: Secondary | ICD-10-CM

## 2018-02-25 DIAGNOSIS — I10 Essential (primary) hypertension: Secondary | ICD-10-CM | POA: Diagnosis not present

## 2018-02-25 DIAGNOSIS — F329 Major depressive disorder, single episode, unspecified: Secondary | ICD-10-CM

## 2018-02-25 LAB — PROTIME-INR
INR: 1.17
Prothrombin Time: 14.8 seconds (ref 11.4–15.2)

## 2018-02-25 LAB — AMMONIA: AMMONIA: 22 umol/L (ref 9–35)

## 2018-02-25 LAB — COMPREHENSIVE METABOLIC PANEL
ALT: 23 U/L (ref 0–44)
AST: 34 U/L (ref 15–41)
Albumin: 3.7 g/dL (ref 3.5–5.0)
Alkaline Phosphatase: 147 U/L — ABNORMAL HIGH (ref 38–126)
Anion gap: 11 (ref 5–15)
BILIRUBIN TOTAL: 0.6 mg/dL (ref 0.3–1.2)
BUN: 9 mg/dL (ref 6–20)
CALCIUM: 9.3 mg/dL (ref 8.9–10.3)
CO2: 24 mmol/L (ref 22–32)
CREATININE: 0.54 mg/dL (ref 0.44–1.00)
Chloride: 105 mmol/L (ref 98–111)
GFR calc Af Amer: >60 mL/min (ref >60)
GFR calc non Af Amer: >60 mL/min (ref >60)
Glucose, Bld: 122 mg/dL — ABNORMAL HIGH (ref 70–99)
Potassium: 4.4 mmol/L (ref 3.5–5.1)
Sodium: 140 mmol/L (ref 135–145)
TOTAL PROTEIN: 7.5 g/dL (ref 6.5–8.1)

## 2018-02-25 LAB — CBC
HEMATOCRIT: 27.8 % — AB (ref 36.0–46.0)
Hemoglobin: 8.6 g/dL — ABNORMAL LOW (ref 12.0–15.0)
MCH: 26.6 pg (ref 26.0–34.0)
MCHC: 30.9 g/dL (ref 30.0–36.0)
MCV: 86.1 fL (ref 78.0–100.0)
Platelets: 183 10*3/uL (ref 150–400)
RBC: 3.23 MIL/uL — AB (ref 3.87–5.11)
RDW: 17.6 % — ABNORMAL HIGH (ref 11.5–15.5)
WBC: 5.7 10*3/uL (ref 4.0–10.5)

## 2018-02-25 MED ORDER — HYDROCHLOROTHIAZIDE 25 MG PO TABS
25.0000 mg | ORAL_TABLET | Freq: Every day | ORAL | Status: DC
  Administered 2018-02-25: 25 mg via ORAL
  Filled 2018-02-25: qty 1

## 2018-02-25 MED ORDER — FOLIC ACID 1 MG PO TABS: 1.0000 mg | ORAL_TABLET | Freq: Every day | ORAL | 0 refills | Status: DC

## 2018-02-25 MED ORDER — PROPRANOLOL HCL 20 MG PO TABS
20.0000 mg | ORAL_TABLET | Freq: Two times a day (BID) | ORAL | Status: DC
  Administered 2018-02-25: 20 mg via ORAL
  Filled 2018-02-25: qty 1

## 2018-02-25 MED ORDER — THIAMINE HCL 100 MG PO TABS: 100.0000 mg | ORAL_TABLET | Freq: Every day | ORAL | 0 refills | Status: DC

## 2018-02-25 MED ORDER — PROPRANOLOL HCL 20 MG PO TABS: 20.0000 mg | ORAL_TABLET | Freq: Two times a day (BID) | ORAL | 0 refills | Status: DC

## 2018-02-25 MED ORDER — ALBUTEROL SULFATE HFA 108 (90 BASE) MCG/ACT IN AERS: 2.0000 | INHALATION_SPRAY | Freq: Four times a day (QID) | RESPIRATORY_TRACT | 0 refills | Status: DC | PRN

## 2018-02-25 NOTE — Telephone Encounter (Signed)
-----   Message from Levin Erp, [place] sent at 02/25/2018 11:14 AM EDT ----- Regarding: Needs follow up Needs follow up Dr. Carlean Purl in 4-6 weeks. Thanks-JLL

## 2018-02-25 NOTE — Telephone Encounter (Signed)
04/20/18 at 11 am f/u appt with Dr Carlean Purl scheduled.  Pt to be notified at discharge.

## 2018-02-25 NOTE — Progress Notes (Signed)
Progress Note   Subjective  Chief Complaint: Intermittent melena, cirrhosis, hematemesis, anemia  Patient doing well this morning, she would like to go home.  Tells me that she has not had a bowel movement since time of procedure yesterday but has been passing a lot of gas and was able to eat a regular diet this morning.  Denies any new complaints or concerns.   Objective   Vital signs in last 24 hours: Temp:  [98.3 F (36.8 C)-99 F (37.2 C)] 98.3 F (36.8 C) (08/09 0507) Pulse Rate:  [86-105] 86 (08/09 1040) Resp:  [13-18] 18 (08/09 0507) BP: (109-158)/(74-98) 136/86 (08/09 1040) SpO2:  [95 %-100 %] 96 % (08/09 0507) Weight:  [66.8 kg] 66.8 kg (08/08 1355) Last BM Date: 02/23/18 General:    white female in NAD Heart:  Regular rate and rhythm; no murmurs Lungs: Respirations even and unlabored, lungs CTA bilaterally Abdomen:  Soft, nontender and nondistended. Normal bowel sounds. Extremities:  Without edema. Neurologic:  Alert and oriented,  grossly normal neurologically. Psych:  Cooperative. Normal mood and affect.  Intake/Output from previous day: 08/08 0701 - 08/09 0700 In: 300 [I.V.:300] Out: -   Lab Results: Recent Labs    02/23/18 1749 02/24/18 0255 02/25/18 0514  WBC 9.4 6.4 5.7  HGB 7.1* 8.4* 8.6*  HCT 23.8* 27.3* 27.8*  PLT 219 196 183   BMET Recent Labs    02/23/18 1210 02/23/18 1749 02/25/18 0514  NA 136 139 140  K 4.2 3.6 4.4  CL 103 103 105  CO2 26 27 24   GLUCOSE 112* 102* 122*  BUN 7 10 9   CREATININE 0.54 0.61 0.54  CALCIUM 9.6 9.6 9.3   LFT Recent Labs    02/25/18 0514  PROT 7.5  ALBUMIN 3.7  AST 34  ALT 23  ALKPHOS 147*  BILITOT 0.6   PT/INR Recent Labs    02/23/18 1900 02/25/18 0514  LABPROT 14.7 14.8  INR 1.16 1.17    Studies/Results: Ct Abdomen Pelvis Wo Contrast  Result Date: 02/23/2018 CLINICAL DATA:  52 year old female with cirrhosis and flank bruising. Evaluate for retroperitoneal hemorrhage. EXAM: CT ABDOMEN  AND PELVIS WITHOUT CONTRAST TECHNIQUE: Multidetector CT imaging of the abdomen and pelvis was performed following the standard protocol without IV contrast. COMPARISON:  Prior CT scan of the abdomen and pelvis 11/27/2016 FINDINGS: Lower chest: The visualized lower lungs are clear. The intracardiac blood pool is hypodense relative to the adjacent myocardium consistent with anemia. Unremarkable visualized distal thoracic esophagus. The heart is normal in size. Remote right 9, 10 and 11 rib fractures. Hepatobiliary: Nodular hepatic contour consistent with the clinical history of cirrhosis. The gallbladder is decompressed. Limited evaluation in the absence of intravenous contrast, but no obvious hepatic mass. No intra or extrahepatic biliary ductal dilatation. Pancreas: Unremarkable. No pancreatic ductal dilatation or surrounding inflammatory changes. Spleen: Punctate calcification consistent with old granulomatous disease. The spleen is normal in size. Adrenals/Urinary Tract: Normal adrenal glands. No evidence of hydronephrosis or nephrolithiasis. Ureters and bladder are unremarkable. Stomach/Bowel: No focal bowel wall thickening or evidence of obstruction. Vascular/Lymphatic: Limited evaluation in the absence of intravenous contrast. No evidence of aneurysm. Mild atherosclerotic calcifications in the abdominal aorta. Reproductive: Surgical changes of prior hysterectomy. No adnexal mass identified. Other: No evidence of retroperitoneal hemorrhage. Small fat containing umbilical hernia. No ascites. Musculoskeletal: No acute fracture or aggressive appearing lytic or blastic osseous lesion. L5-S1 degenerative disc disease. IMPRESSION: 1. No evidence of retroperitoneal hemorrhage or other site of acute  bleeding. 2. The intracardiac blood pool is hypodense relative to the adjacent myocardium consistent with anemia. 3. Nodular hepatic contour consistent with the clinical history of cirrhosis. 4.  Aortic Atherosclerosis  (ICD10-170.0) 5. Small umbilical hernia. Electronically Signed   By: Jacqulynn Cadet M.D.   On: 02/23/2018 19:56   EGD 02/24/2018 Grade 0-1 minimal esophageal varices, small hiatal hernia, mild portal hypertensive gastropathy and otherwise normal    Assessment / Plan:   Assessment: 1.  History of hematemesis and melena: EGD 02/24/2018 as above, started on low-dose beta-blocker 2.  Acute blood loss anemia 3.  Alcoholic cirrhosis 4.  Increasing abdominal distention 5.  GERD  Plan: 1.  Started on low-dose Propranolol today 2.  Patient will follow in clinic in 4 to 6 weeks with Dr. Carlean Purl 3.  Dr. Carlean Purl will notify the patient in regards to rehab sometime next week, let her know this today. 4.  Patient may be discharged.  Discharge Planning Diet: Regular/low-sodium Discharge Medications: Continue current liver meds with addition of low-dose propranolol Follow up: Dr. Carlean Purl in 4-6 weeks, our office will also be calling the patient in regards to rehab next week  We will sign off.   LOS: 0 days   Levin Erp  02/25/2018, 11:09 AM

## 2018-02-25 NOTE — Discharge Summary (Addendum)
Physician Discharge Summary  [name]  OZD:[id number]  DOB: 15-Dec-1965  DOA: 02/23/2018 PCP: [name], PA-C  Admit date: 02/23/2018 Discharge date: 02/25/2018  Admitted From: Home  Disposition:  Home   Recommendations for Outpatient Follow-up:  1. Follow up with PCP in 1 week  2. Follow up with Dr. Carlean Purl in 4-6 weeks  3. Please obtain BMP/CBC in one week to monitor Hgb and renal function  4. Please follow up on the following pending results: EGD biopsy   Discharge Condition:Stable   CODE STATUS: Full Code  Diet recommendation: Heart Healthy   Brief/Interim Summary: For full details see H&P/Progress note, but in brief, Amy Villa is a 52 year old female with medical history significant for alcohol liver cirrhosis, hypertension, anxiety and depression who presented to the emergency department after being sent by GI and found hemoglobin of 7.1.  Patient has been complaining of weakness, fatigue and shortness of breath with exertion.  Patient reported 3 episodes of hematemesis and 2 episodes of black stools about 2 weeks prior to admission.  Upon ED evaluation hemoglobin was found to be 7.8, CT abdomen pelvis without contrast with no evidence of retroperitoneal hemorrhage or other signs of acute bleeding.  EDP consulted GI who recommended EGD.  Patient was admitted with working diagnosis of acute blood loss anemia.  Subjective: Patient seen and examined, have no complaints. Denies N/V and diarrhea. No acute events overnight. Hgb stable.   Discharge Diagnoses/Hospital Course:  Active Problems:   Alcohol abuse   Benign essential hypertension   Liver cirrhosis (HCC)   Hepatic steatosis   Anxiety and depression   GI bleed  Acute blood loss anemia Felt to be secondary to GI bleed, patient underwent EGD findings of grade 0-1 minimal esophageal varices, mild portal hypertensive gastropathy, small hiatal hernia.  Biopsy was taken. Tolerating regular diet well. Continue  Protonix BID. Patient was transfused 1 unit of PRBC, hemoglobin responded well and trending up.   Alcoholic liver cirrhosis CT of the abdomen was negative for ascites, no signs of encephalopathy, Ammonia level normal. Patient on home lactulose however she report this is for constipation. Will continue this.   Esophageal varices  Started on low dose propanolol, follow up with GI   Alcohol abuse Alcohol cessation discussed, continue thiamine and folate.  At this time no signs of alcohol withdrawal. Patient will benefit of outpatient rehab.  Hypertension BP stable resume, HCTZ, Monitor closely that she has been started on propanolol.  Follow up with PCP.   Suspect early COPD CXR with some hyperinflation. Patient is a smoker. Will prescribe albuterol inhaler. Will need to see pulm for PFT.   On the day of the discharge the patient's vitals were stable, and no other acute medical condition were reported by patient. the patient was felt safe to be discharge to home.   Discharge Instructions  You were cared for by a hospitalist during your hospital stay. If you have any questions about your discharge medications or the care you received while you were in the hospital after you are discharged, you can call the unit and asked to speak with the hospitalist on call if the hospitalist that took care of you is not available. Once you are discharged, your primary care physician will handle any further medical issues. Please note that NO REFILLS for any discharge medications will be authorized once you are discharged, as it is imperative that you return to your primary care physician (or establish a relationship with a  primary care physician if you do not have one) for your aftercare needs so that they can reassess your need for medications and monitor your lab values.  Discharge Instructions    Call MD for:  difficulty breathing, headache or visual disturbances   Complete by:  As directed    Call MD  for:  extreme fatigue   Complete by:  As directed    Call MD for:  hives   Complete by:  As directed    Call MD for:  persistant dizziness or light-headedness   Complete by:  As directed    Call MD for:  persistant nausea and vomiting   Complete by:  As directed    Call MD for:  redness, tenderness, or signs of infection (pain, swelling, redness, odor or green/yellow discharge around incision site)   Complete by:  As directed    Call MD for:  severe uncontrolled pain   Complete by:  As directed    Call MD for:  temperature >100.4   Complete by:  As directed    Diet general   Complete by:  As directed    Discharge instructions   Complete by:  As directed    Stop drinking alcohol  Follow up with primary doctor in 1 week Monitor blood pressure at home   Increase activity slowly   Complete by:  As directed      Allergies as of 02/25/2018   No Known Allergies     Medication List    STOP taking these medications   celecoxib 100 MG capsule Commonly known as:  CELEBREX     TAKE these medications   albuterol 108 (90 Base) MCG/ACT inhaler Commonly known as:  PROVENTIL HFA;VENTOLIN HFA Inhale 2 puffs into the lungs every 6 (six) hours as needed for wheezing or shortness of breath.   alendronate 70 MG tablet Commonly known as:  FOSAMAX Take with a full glass of water on an empty stomach. STOP FOR NOW 02/23/2018   busPIRone 15 MG tablet Commonly known as:  BUSPAR TAKE 1 TABLET(15 MG) BY MOUTH TWICE DAILY   cyclobenzaprine 10 MG tablet Commonly known as:  FLEXERIL TAKE 1 TABLET BY MOUTH TWICE DAILY TO THREE TIMES DAILY AS NEEDED FOR MUSCLE SPASMS   FLUoxetine 40 MG capsule Commonly known as:  PROZAC Take 1 capsule (40 mg total) by mouth daily.   folic acid 1 MG tablet Commonly known as:  FOLVITE Take 1 tablet (1 mg total) by mouth daily. Start taking on:  02/26/2018   gabapentin 300 MG capsule Commonly known as:  NEURONTIN TAKE 2 CAPSULES BY MOUTH EVERY MORNING AND NOON  THEN 3 CAPSULES EVERY EVENING   hydrochlorothiazide 25 MG tablet Commonly known as:  HYDRODIURIL Take 25 mg by mouth daily.   lactulose 10 GM/15ML solution Commonly known as:  CHRONULAC Take 30 mLs (20 g total) by mouth daily.   loratadine 10 MG tablet Commonly known as:  CLARITIN Take 10 mg by mouth daily.   ondansetron 4 MG disintegrating tablet Commonly known as:  ZOFRAN-ODT DISSOLVE 1 TABLET(4 MG) ON THE TONGUE EVERY 8 HOURS AS NEEDED FOR NAUSEA OR VOMITING   pantoprazole 40 MG tablet Commonly known as:  PROTONIX Take 1 tablet (40 mg total) by mouth 2 (two) times daily before a meal.   potassium chloride SA 20 MEQ tablet Commonly known as:  K-DUR,KLOR-CON Take 20 mEq by mouth 2 (two) times daily.   propranolol 20 MG tablet Commonly known as:  INDERAL  Take 1 tablet (20 mg total) by mouth 2 (two) times daily.   thiamine 100 MG tablet Take 1 tablet (100 mg total) by mouth daily. Start taking on:  02/26/2018      Follow-up Information    [name], MD. Schedule an appointment as soon as possible for a visit in 4 week(s).   Specialty:  Gastroenterology Why:  Hospital follow up Contact information: [address] [place] [address] [phone]        [name], PA-C. Schedule an appointment as soon as possible for a visit in 1 week(s).   Specialty:  Family Medicine Why:  Hospital follow up  Contact information: [address] [place] [address] [phone]        Juanito Doom, MD. Schedule an appointment as soon as possible for a visit in 2 week(s).   Specialty:  Pulmonary Disease Why:  Establish Care  Contact information: [place] [address] [phone]          No Known Allergies  Consultations: GI   Procedures/Studies: Ct Abdomen Pelvis Wo Contrast  Result Date: 02/23/2018 CLINICAL DATA:  52 year old female with cirrhosis and flank bruising. Evaluate for retroperitoneal hemorrhage. EXAM: CT  ABDOMEN AND PELVIS WITHOUT CONTRAST TECHNIQUE: Multidetector CT imaging of the abdomen and pelvis was performed following the standard protocol without IV contrast. COMPARISON:  Prior CT scan of the abdomen and pelvis 11/27/2016 FINDINGS: Lower chest: The visualized lower lungs are clear. The intracardiac blood pool is hypodense relative to the adjacent myocardium consistent with anemia. Unremarkable visualized distal thoracic esophagus. The heart is normal in size. Remote right 9, 10 and 11 rib fractures. Hepatobiliary: Nodular hepatic contour consistent with the clinical history of cirrhosis. The gallbladder is decompressed. Limited evaluation in the absence of intravenous contrast, but no obvious hepatic mass. No intra or extrahepatic biliary ductal dilatation. Pancreas: Unremarkable. No pancreatic ductal dilatation or surrounding inflammatory changes. Spleen: Punctate calcification consistent with old granulomatous disease. The spleen is normal in size. Adrenals/Urinary Tract: Normal adrenal glands. No evidence of hydronephrosis or nephrolithiasis. Ureters and bladder are unremarkable. Stomach/Bowel: No focal bowel wall thickening or evidence of obstruction. Vascular/Lymphatic: Limited evaluation in the absence of intravenous contrast. No evidence of aneurysm. Mild atherosclerotic calcifications in the abdominal aorta. Reproductive: Surgical changes of prior hysterectomy. No adnexal mass identified. Other: No evidence of retroperitoneal hemorrhage. Small fat containing umbilical hernia. No ascites. Musculoskeletal: No acute fracture or aggressive appearing lytic or blastic osseous lesion. L5-S1 degenerative disc disease. IMPRESSION: 1. No evidence of retroperitoneal hemorrhage or other site of acute bleeding. 2. The intracardiac blood pool is hypodense relative to the adjacent myocardium consistent with anemia. 3. Nodular hepatic contour consistent with the clinical history of cirrhosis. 4.  Aortic  Atherosclerosis (ICD10-170.0) 5. Small umbilical hernia. Electronically Signed   By: Jacqulynn Cadet M.D.   On: 02/23/2018 19:56   Dg Chest 2 View  Result Date: 02/25/2018 CLINICAL DATA:  Shortness of breath. EXAM: CHEST - 2 VIEW COMPARISON:  Radiograph of February 27, 2017. FINDINGS: The heart size and mediastinal contours are within normal limits. Both lungs are clear. No pneumothorax or pleural effusion is noted. The visualized skeletal structures are unremarkable. IMPRESSION: No active cardiopulmonary disease. Electronically Signed   By: Marijo Conception, M.D.   On: 02/25/2018 12:12     Discharge Exam: Vitals:   02/25/18 0507 02/25/18 1040  BP: 109/83 136/86  Pulse: 87 86  Resp: 18  Temp: 98.3 F (36.8 C)   SpO2: 96%    Vitals:   02/24/18 1540 02/24/18 1603 02/25/18 0507 02/25/18 1040  BP: 139/74 (!) 158/95 109/83 136/86  Pulse: (!) 102 91 87 86  Resp: 14  18   Temp:  98.5 F (36.9 C) 98.3 F (36.8 C)   TempSrc:  Oral Oral   SpO2: 95% 100% 96%   Weight:      Height:        General: Pt is alert, awake, not in acute distress Cardiovascular: RRR, S1/S2 +, no rubs, no gallops Respiratory: CTA bilaterally, no wheezing, no rhonchi Abdominal: Soft, NT, ND, bowel sounds + Extremities: no edema, no cyanosis  The results of significant diagnostics from this hospitalization (including imaging, microbiology, ancillary and laboratory) are listed below for reference.     Microbiology: Recent Results (from the past 240 hour(s))  MRSA PCR Screening     Status: None   Collection Time: 02/23/18  9:16 PM  Result Value Ref Range Status   MRSA by PCR NEGATIVE NEGATIVE Final    Comment:        The GeneXpert MRSA Assay (FDA approved for NASAL specimens only), is one component of a comprehensive MRSA colonization surveillance program. It is not intended to diagnose MRSA infection nor to guide or monitor treatment for MRSA infections. Performed at [facility],  [place] [address] [place], [place] [address]      Labs: BNP (last 3 results) No results for input(s): BNP in the last 8760 hours. Basic Metabolic Panel: Recent Labs  Lab 02/23/18 1210 02/23/18 1749 02/23/18 1900 02/25/18 0514  NA 136 139  --  140  K 4.2 3.6  --  4.4  CL 103 103  --  105  CO2 26 27  --  24  GLUCOSE 112* 102*  --  122*  BUN 7 10  --  9  CREATININE 0.54 0.61  --  0.54  CALCIUM 9.6 9.6  --  9.3  MG  --   --  2.3  --   PHOS  --   --  4.1  --    Liver Function Tests: Recent Labs  Lab 02/23/18 1210 02/25/18 0514  AST 31 34  ALT 19 23  ALKPHOS 157* 147*  BILITOT 0.6 0.6  PROT 7.8 7.5  ALBUMIN 4.3 3.7   No results for input(s): LIPASE, AMYLASE in the last 168 hours. Recent Labs  Lab 02/25/18 0514  AMMONIA 22   CBC: Recent Labs  Lab 02/23/18 1210 02/23/18 1749 02/24/18 0255 02/25/18 0514  WBC 8.1 9.4 6.4 5.7  NEUTROABS 5.9  --   --   --   HGB 7.1 Repeated and verified X2.* 7.1* 8.4* 8.6*  HCT 22.3 Repeated and verified X2.* 23.8* 27.3* 27.8*  MCV 82.0 85.6 84.3 86.1  PLT 187.0 219 196 183   Cardiac Enzymes: No results for input(s): CKTOTAL, CKMB, CKMBINDEX, TROPONINI in the last 168 hours. BNP: Invalid input(s): POCBNP CBG: Recent Labs  Lab 02/23/18 1747  GLUCAP 102*   D-Dimer No results for input(s): DDIMER in the last 72 hours. Hgb A1c No results for input(s): HGBA1C in the last 72 hours. Lipid Profile No results for input(s): CHOL, HDL, LDLCALC, TRIG, CHOLHDL, LDLDIRECT in the last 72 hours. Thyroid function studies No results for input(s): TSH, T4TOTAL, T3FREE, THYROIDAB in the last 72 hours.  Invalid input(s): FREET3 Anemia work up No results for input(s): VITAMINB12, FOLATE, FERRITIN, TIBC, IRON, RETICCTPCT in the last  72 hours. Urinalysis    Component Value Date/Time   COLORURINE STRAW (A) 02/23/2018 1955   APPEARANCEUR CLEAR 02/23/2018 1955   LABSPEC 1.002 (L) 02/23/2018 1955   PHURINE 7.0 02/23/2018 1955   GLUCOSEU  NEGATIVE 02/23/2018 1955   GLUCOSEU NEGATIVE 07/22/2016 [place] 02/23/2018 1955   BILIRUBINUR NEGATIVE 02/23/2018 1955   KETONESUR NEGATIVE 02/23/2018 1955   PROTEINUR NEGATIVE 02/23/2018 1955   UROBILINOGEN 0.2 07/22/2016 1204   NITRITE NEGATIVE 02/23/2018 1955   LEUKOCYTESUR NEGATIVE 02/23/2018 1955   Sepsis Labs Invalid input(s): PROCALCITONIN,  WBC,  LACTICIDVEN Microbiology Recent Results (from the past 240 hour(s))  MRSA PCR Screening     Status: None   Collection Time: 02/23/18  9:16 PM  Result Value Ref Range Status   MRSA by PCR NEGATIVE NEGATIVE Final    Comment:        The GeneXpert MRSA Assay (FDA approved for NASAL specimens only), is one component of a comprehensive MRSA colonization surveillance program. It is not intended to diagnose MRSA infection nor to guide or monitor treatment for MRSA infections. Performed at [facility] [place] [address], [place], [place] [address]     Time coordinating discharge: 35 minutes  SIGNED:  Chipper Oman, MD  [facility] 02/25/2018, 1:09 PM  Pager please text page via  [email or web address]  Note - This record has been created using [employer]. Chart creation errors have been sought, but may not always have been located. Such creation errors do not reflect on the standard of medical care.

## 2018-02-27 LAB — BPAM RBC
BLOOD PRODUCT EXPIRATION DATE: 201908232359
Blood Product Expiration Date: 201908232359
ISSUE DATE / TIME: 201908072126
Unit Type and Rh: 600
Unit Type and Rh: 600

## 2018-02-27 LAB — TYPE AND SCREEN
ABO/RH(D): A NEG
ANTIBODY SCREEN: NEGATIVE
Unit division: 0
Unit division: 0

## 2018-02-27 NOTE — Anesthesia Postprocedure Evaluation (Signed)
Anesthesia Post Note  Patient: Amy Villa  Procedure(s) Performed: ESOPHAGOGASTRODUODENOSCOPY (EGD) WITH PROPOFOL (N/A ) BIOPSY     Patient location during evaluation: Endoscopy Anesthesia Type: MAC Level of consciousness: awake and alert Pain management: pain level controlled Vital Signs Assessment: post-procedure vital signs reviewed and stable Respiratory status: spontaneous breathing, nonlabored ventilation, respiratory function stable and patient connected to nasal cannula oxygen Cardiovascular status: blood pressure returned to baseline and stable Postop Assessment: no apparent nausea or vomiting Anesthetic complications: no    Last Vitals:  Vitals:   02/25/18 0507 02/25/18 1040  BP: 109/83 136/86  Pulse: 87 86  Resp: 18   Temp: 36.8 C   SpO2: 96%     Last Pain:  Vitals:   02/25/18 0759  TempSrc:   PainSc: 0-No pain                 Aalivia Mcgraw L Roselina Burgueno

## 2018-02-28 ENCOUNTER — Telehealth: Payer: Self-pay

## 2018-02-28 ENCOUNTER — Ambulatory Visit (HOSPITAL_COMMUNITY): Payer: Self-pay

## 2018-02-28 NOTE — Telephone Encounter (Signed)
LM requesting call back to schedule hospital f/u with PCP.

## 2018-03-02 ENCOUNTER — Encounter (HOSPITAL_COMMUNITY): Payer: Self-pay

## 2018-03-02 ENCOUNTER — Ambulatory Visit (HOSPITAL_COMMUNITY): Admit: 2018-03-02 | Payer: BLUE CROSS/BLUE SHIELD | Admitting: Internal Medicine

## 2018-03-02 ENCOUNTER — Encounter: Payer: Self-pay | Admitting: Gastroenterology

## 2018-03-02 SURGERY — EGD (ESOPHAGOGASTRODUODENOSCOPY)
Anesthesia: Moderate Sedation

## 2018-03-07 ENCOUNTER — Inpatient Hospital Stay: Payer: Self-pay | Admitting: Physician Assistant

## 2018-03-08 ENCOUNTER — Inpatient Hospital Stay: Payer: Self-pay | Admitting: Physician Assistant

## 2018-03-08 ENCOUNTER — Ambulatory Visit (INDEPENDENT_AMBULATORY_CARE_PROVIDER_SITE_OTHER): Payer: BLUE CROSS/BLUE SHIELD | Admitting: Physician Assistant

## 2018-03-08 ENCOUNTER — Encounter: Payer: Self-pay | Admitting: Physician Assistant

## 2018-03-08 ENCOUNTER — Other Ambulatory Visit: Payer: Self-pay

## 2018-03-08 VITALS — BP 130/88 | HR 78 | Temp 98.3°F | Resp 16 | Ht 62.0 in | Wt 143.0 lb

## 2018-03-08 DIAGNOSIS — K7030 Alcoholic cirrhosis of liver without ascites: Secondary | ICD-10-CM

## 2018-03-08 DIAGNOSIS — F1010 Alcohol abuse, uncomplicated: Secondary | ICD-10-CM

## 2018-03-08 DIAGNOSIS — D500 Iron deficiency anemia secondary to blood loss (chronic): Secondary | ICD-10-CM | POA: Diagnosis not present

## 2018-03-08 DIAGNOSIS — B354 Tinea corporis: Secondary | ICD-10-CM

## 2018-03-08 DIAGNOSIS — Z23 Encounter for immunization: Secondary | ICD-10-CM

## 2018-03-08 DIAGNOSIS — J42 Unspecified chronic bronchitis: Secondary | ICD-10-CM

## 2018-03-08 DIAGNOSIS — K922 Gastrointestinal hemorrhage, unspecified: Secondary | ICD-10-CM | POA: Diagnosis not present

## 2018-03-08 DIAGNOSIS — F17210 Nicotine dependence, cigarettes, uncomplicated: Secondary | ICD-10-CM

## 2018-03-08 LAB — CBC WITH DIFFERENTIAL/PLATELET
BASOS ABS: 0 10*3/uL (ref 0.0–0.1)
Basophils Relative: 0.8 % (ref 0.0–3.0)
Eosinophils Absolute: 0.2 10*3/uL (ref 0.0–0.7)
Eosinophils Relative: 2.6 % (ref 0.0–5.0)
HEMATOCRIT: 28.7 % — AB (ref 36.0–46.0)
Hemoglobin: 9 g/dL — ABNORMAL LOW (ref 12.0–15.0)
Lymphocytes Relative: 18.2 % (ref 12.0–46.0)
Lymphs Abs: 1.1 10*3/uL (ref 0.7–4.0)
MCHC: 31.2 g/dL (ref 30.0–36.0)
MCV: 81.6 fl (ref 78.0–100.0)
MONOS PCT: 8.2 % (ref 3.0–12.0)
Monocytes Absolute: 0.5 10*3/uL (ref 0.1–1.0)
Neutro Abs: 4.4 10*3/uL (ref 1.4–7.7)
Neutrophils Relative %: 70.2 % (ref 43.0–77.0)
Platelets: 191 10*3/uL (ref 150.0–400.0)
RBC: 3.51 Mil/uL — AB (ref 3.87–5.11)
RDW: 19.4 % — ABNORMAL HIGH (ref 11.5–15.5)
WBC: 6.2 10*3/uL (ref 4.0–10.5)

## 2018-03-08 LAB — BASIC METABOLIC PANEL
BUN: 12 mg/dL (ref 6–23)
CALCIUM: 9.3 mg/dL (ref 8.4–10.5)
CHLORIDE: 106 meq/L (ref 96–112)
CO2: 25 meq/L (ref 19–32)
CREATININE: 0.67 mg/dL (ref 0.40–1.20)
GFR: 98.35 mL/min (ref >60.00)
GLUCOSE: 123 mg/dL — AB (ref 70–99)
Potassium: 4.4 mEq/L (ref 3.5–5.1)
Sodium: 138 mEq/L (ref 135–145)

## 2018-03-08 MED ORDER — CLOTRIMAZOLE-BETAMETHASONE 1-0.05 % EX CREA: 1.0000 "application " | TOPICAL_CREAM | Freq: Two times a day (BID) | CUTANEOUS | 0 refills | Status: DC

## 2018-03-08 NOTE — Progress Notes (Signed)
Patient presents to clinic today for hospitalization follow-up. Patient presented to the ER on 02/23/18 after having hgb checked at GI and noting to be very low at 7.1. ER assessment included repeat CBC (hgb 7.8), CT Abdomen/Pelvis (negative). Patient was consulted by GI who recommended EGD and subsequently admitted to the hospital for further assessment. Hospital course included EGD revealing varices, mild portal hypertensive gastropathy, small hiatal hernia. A biopsy was taken with pending results on discharge (negative for malignancy/dysplasia). Patient was given PPI BID and was transfused 1 unit with good hemoglobin response.  Patient was discharged on folic acid, propranolol (varices) and Pantoprazole with instructions to follow-up with PCP and GI.   Since discharge, patient endorses doing much better. Denies SOB, racing heart. Energy levels are improved. Denies melena, hematochezia or tenesmus. IS taking medications as directed. Is eating and hydrating well. Does have follow-up scheduled with GI on 04-20-18. Has completely ceased all alcohol cessation since being in the hospital. Is working on decreasing smoking with use of the patch. Notes mood is doing well overall. She is concerned about findings of potential COPD on CXR (hyperinflation). It is recommended she have PFTS to further assess.   Past Medical History:  Diagnosis Date  . Alcoholic hepatitis   . Aortic atherosclerosis ([place])   . Arthritis    neck  . Carpal tunnel syndrome on both sides 02/2012  . Cigarette nicotine dependence   . Contact lens/glasses fitting    wears contacts or glasses  . Dental crowns present    x 2 upper front  . Elevated LFTs   . Fatty liver   . Fluid retention   . History of MRSA infection   . Hypertension   . Insomnia   . Migraines   . Osteoarthritis   . Parasomnia   . Wears partial dentures    lower    Current Outpatient Medications on File Prior to Visit  Medication Sig Dispense Refill  .  albuterol (PROVENTIL HFA;VENTOLIN HFA) 108 (90 Base) MCG/ACT inhaler Inhale 2 puffs into the lungs every 6 (six) hours as needed for wheezing or shortness of breath. 1 Inhaler 0  . busPIRone (BUSPAR) 15 MG tablet TAKE 1 TABLET(15 MG) BY MOUTH TWICE DAILY 60 tablet 5  . cyclobenzaprine (FLEXERIL) 10 MG tablet TAKE 1 TABLET BY MOUTH TWICE DAILY TO THREE TIMES DAILY AS NEEDED FOR MUSCLE SPASMS 90 tablet 0  . FLUoxetine (PROZAC) 40 MG capsule Take 1 capsule (40 mg total) by mouth daily. 30 capsule 3  . folic acid (FOLVITE) 1 MG tablet Take 1 tablet (1 mg total) by mouth daily. 30 tablet 0  . gabapentin (NEURONTIN) 100 MG capsule   3  . gabapentin (NEURONTIN) 300 MG capsule TAKE 2 CAPSULES BY MOUTH EVERY MORNING AND NOON THEN 3 CAPSULES EVERY EVENING 210 capsule 1  . hydrochlorothiazide (HYDRODIURIL) 25 MG tablet Take 25 mg by mouth daily.    [name] lactulose (CHRONULAC) 10 GM/15ML solution Take 30 mLs (20 g total) by mouth daily. 946 mL 3  . meloxicam (MOBIC) 7.5 MG tablet TK 1 T PO BID PRN    . ondansetron (ZOFRAN-ODT) 4 MG disintegrating tablet DISSOLVE 1 TABLET(4 MG) ON THE TONGUE EVERY 8 HOURS AS NEEDED FOR NAUSEA OR VOMITING 20 tablet 2  . pantoprazole (PROTONIX) 40 MG tablet Take 1 tablet (40 mg total) by mouth 2 (two) times daily before a meal. 90 tablet 3  . potassium chloride SA (K-DUR,KLOR-CON) 20 MEQ tablet Take 20 mEq by mouth 2 (  two) times daily.    . propranolol (INDERAL) 20 MG tablet Take 1 tablet (20 mg total) by mouth 2 (two) times daily. 60 tablet 0  . alendronate (FOSAMAX) 70 MG tablet Take with a full glass of water on an empty stomach. STOP FOR NOW 02/23/2018 (Patient not taking: Reported on 03/08/2018) 12 tablet 0  . loratadine (CLARITIN) 10 MG tablet Take 10 mg by mouth daily.    [name] thiamine 100 MG tablet Take 1 tablet (100 mg total) by mouth daily. (Patient not taking: Reported on 03/08/2018) 30 tablet 0   No current facility-administered medications on file prior to visit.     No  Known Allergies  Family History  Problem Relation Age of Onset  . Diabetes Mother   . Hypertension Mother   . Colon polyps Sister   . Hypertension Sister   . Diabetes Sister   . Lung cancer Maternal Grandmother   . Colon cancer Neg Hx   . Esophageal cancer Neg Hx   . Pancreatic cancer Neg Hx   . Stomach cancer Neg Hx   . Liver disease Neg Hx     Social History   Socioeconomic History  . Marital status: Married    Spouse name: Not on file  . Number of children: Not on file  . Years of education: Not on file  . Highest education level: Not on file  Occupational History  . Occupation: Education administrator  Social Needs  . Financial resource strain: Not on file  . Food insecurity:    Worry: Not on file    Inability: Not on file  . Transportation needs:    Medical: Not on file    Non-medical: Not on file  Tobacco Use  . Smoking status: Current Every Day Smoker    Packs/day: 0.50    Years: 10.00    Pack years: 5.00    Types: Cigarettes  . Smokeless tobacco: Never Used  Substance and Sexual Activity  . Alcohol use: Yes    Comment: 2 Mikes hard drinks; now has only occasional as of 11/18/16   . Drug use: No  . Sexual activity: Not on file  Lifestyle  . Physical activity:    Days per week: Not on file    Minutes per session: Not on file  . Stress: Not on file  Relationships  . Social connections:    Talks on phone: Not on file    Gets together: Not on file    Attends religious service: Not on file    Active member of club or organization: Not on file    Attends meetings of clubs or organizations: Not on file    Relationship status: Not on file  Other Topics Concern  . 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  All other ROS are negative.  Ht '5\' 2"'$  (1.575 m)   BMI 26.92 kg/m     Physical Exam  Constitutional: She is oriented to person, place, and time. She appears well-developed and well-nourished.  HENT:  Head: Normocephalic and atraumatic.  Right Ear: External ear normal.  Left Ear: External ear normal.  Nose: Nose normal.  Mouth/Throat: Oropharynx is clear and moist.  Neck: Neck supple.  Cardiovascular: Normal rate, regular rhythm, normal heart sounds  and intact distal pulses.  Pulmonary/Chest: Effort normal and breath sounds normal. No stridor. No respiratory distress. She has no wheezes. She has no rales. She exhibits no tenderness.  Abdominal: Soft. Bowel sounds are normal. There is no tenderness.  Neurological: She is alert and oriented to person, place, and time.  Skin:     Psychiatric: She has a normal mood and affect.  Vitals reviewed.   Recent Results (from the past 2160 hour(s))  Comprehensive metabolic panel     Status: Abnormal   Collection Time: 02/23/18 12:10 PM  Result Value Ref Range   Sodium 136 135 - 145 mEq/L   Potassium 4.2 3.5 - 5.1 mEq/L   Chloride 103 96 - 112 mEq/L   CO2 26 19 - 32 mEq/L   Glucose, Bld 112 (H) 70 - 99 mg/dL   BUN 7 6 - 23 mg/dL   Creatinine, Ser 0.54 0.40 - 1.20 mg/dL   Total Bilirubin 0.6 0.2 - 1.2 mg/dL   Alkaline Phosphatase 157 (H) 39 - 117 U/L   AST 31 0 - 37 U/L   ALT 19 0 - 35 U/L   Total Protein 7.8 6.0 - 8.3 g/dL   Albumin 4.3 3.5 - 5.2 g/dL   Calcium 9.6 8.4 - 10.5 mg/dL   GFR 126.17 >60.00 mL/min  CBC with Differential/Platelet     Status: Abnormal   Collection Time: 02/23/18 12:10 PM  Result Value Ref Range   WBC 8.1 4.0 - 10.5 K/uL   RBC 2.72 (L) 3.87 - 5.11 Mil/uL   Hemoglobin 7.1 Repeated and verified X2. (LL) 12.0 - 15.0 g/dL   HCT 22.3 Repeated and verified X2. (LL) 36.0 - 46.0 %   MCV 82.0 78.0 - 100.0 fl   MCHC 31.6 30.0 - 36.0 g/dL   RDW 20.4 (H) 11.5 - 15.5 %   Platelets 187.0 150.0 - 400.0 K/uL   Neutrophils Relative % 72.9 43.0 - 77.0 %   Lymphocytes Relative 14.5 12.0 -  46.0 %   Monocytes Relative 10.7 3.0 - 12.0 %   Eosinophils Relative 1.1 0.0 - 5.0 %   Basophils Relative 0.8 0.0 - 3.0 %   Neutro Abs 5.9 1.4 - 7.7 K/uL   Lymphs Abs 1.2 0.7 - 4.0 K/uL   Monocytes Absolute 0.9 0.1 - 1.0 K/uL   Eosinophils Absolute 0.1 0.0 - 0.7 K/uL   Basophils Absolute 0.1 0.0 - 0.1 K/uL  Protime-INR     Status: Abnormal   Collection Time: 02/23/18 12:10 PM  Result Value Ref Range   INR 1.3 (H) 0.8 - 1.0 ratio   Prothrombin Time 14.7 (H) 9.6 - 13.1 sec  CBG monitoring, ED     Status: Abnormal   Collection Time: 02/23/18  5:47 PM  Result Value Ref Range   Glucose-Capillary 102 (H) 70 - 99 mg/dL  Basic metabolic panel     Status: Abnormal   Collection Time: 02/23/18  5:49 PM  Result Value Ref Range   Sodium 139 135 - 145 mmol/L   Potassium 3.6 3.5 - 5.1 mmol/L   Chloride 103 98 - 111 mmol/L   CO2 27 22 - 32 mmol/L   Glucose, Bld 102 (H) 70 - 99 mg/dL   BUN 10 6 - 20 mg/dL   Creatinine, Ser 0.61 0.44 - 1.00 mg/dL   Calcium 9.6 8.9 - 10.3 mg/dL   GFR calc non Af Amer >60 >60 mL/min   GFR calc Af Amer >60 >60 mL/min    Comment: (NOTE)  The eGFR has been calculated using the CKD EPI equation. This calculation has not been validated in all clinical situations. eGFR's persistently <60 mL/min signify possible Chronic Kidney Disease.    Anion gap 9 5 - 15    Comment: Performed at [facility], [place] [address] [place], [place] [address]  CBC     Status: Abnormal   Collection Time: 02/23/18  5:49 PM  Result Value Ref Range   WBC 9.4 4.0 - 10.5 K/uL   RBC 2.78 (L) 3.87 - 5.11 MIL/uL   Hemoglobin 7.1 (L) 12.0 - 15.0 g/dL   HCT 23.8 (L) 36.0 - 46.0 %   MCV 85.6 78.0 - 100.0 fL   MCH 25.5 (L) 26.0 - 34.0 pg   MCHC 29.8 (L) 30.0 - 36.0 g/dL   RDW 18.4 (H) 11.5 - 15.5 %   Platelets 219 150 - 400 K/uL    Comment: Performed at [facility], [place] [address], [place], [place] [address]  Type and screen [place]      Status: None   Collection Time: 02/23/18  6:59 PM  Result Value Ref Range   ABO/RH(D) A NEG    Antibody Screen NEG    Sample Expiration 02/26/2018    Unit Number [id number]    Blood Component Type RED CELLS,LR    Unit division 00    Status of Unit REL FROM [facility]    Transfusion Status OK TO TRANSFUSE    Crossmatch Result      Compatible Performed at [facility], [place] [address], [place], [place] [address]    Unit Number [id number]    Blood Component Type RED CELLS,LR    Unit division 00    Status of Unit ISSUED,FINAL    Transfusion Status OK TO TRANSFUSE    Crossmatch Result Compatible   ABO/Rh     Status: None   Collection Time: 02/23/18  6:59 PM  Result Value Ref Range   ABO/RH(D)      A NEG Performed at [facility] [address] [place], [place] [address]   BPAM RBC     Status: None   Collection Time: 02/23/18  6:59 PM  Result Value Ref Range   Blood Product Unit Number [id number]    Unit Type and Rh 0600    Blood Product Expiration Date 428768115726    ISSUE DATE / TIME [id number]    Blood Product Unit Number [id number]    PRODUCT CODE [id number]    Unit Type and Rh 0600    Blood Product Expiration Date 048889169450   Protime-INR     Status: None   Collection Time: 02/23/18  7:00 PM  Result Value Ref Range   Prothrombin Time 14.7 11.4 - 15.2 seconds   INR 1.16     Comment: Performed at [facility], [place] [address] [place], [place] [address]  Magnesium     Status: None   Collection Time: 02/23/18  7:00 PM  Result Value Ref Range   Magnesium 2.3 1.7 - 2.4 mg/dL    Comment: Performed at [facility], [place] [address], [place], [place] [address]  Phosphorus     Status: None   Collection Time: 02/23/18  7:00 PM  Result Value Ref Range   Phosphorus 4.1 2.5 - 4.6 mg/dL    Comment: Performed at [facility], [place] [address], [place], [place] [address]  HIV  antibody (Routine Testing)  Status: None   Collection Time: 02/23/18  7:00 PM  Result Value Ref Range   HIV Screen 4th Generation wRfx Non Reactive Non Reactive    Comment: (NOTE) Performed At: [facility] [place], [place] [id number] Rush Farmer MD RW:[id number]   Prepare RBC     Status: None   Collection Time: 02/23/18  7:51 PM  Result Value Ref Range   Order Confirmation      ORDER PROCESSED BY BLOOD BANK Performed at [facility], [place] [address] [place], [place] [address]   Urinalysis, Routine w reflex microscopic     Status: Abnormal   Collection Time: 02/23/18  7:55 PM  Result Value Ref Range   Color, Urine STRAW (A) YELLOW   APPearance CLEAR CLEAR   Specific Gravity, Urine 1.002 (L) 1.005 - 1.030   pH 7.0 5.0 - 8.0   Glucose, UA NEGATIVE NEGATIVE mg/dL   Hgb urine dipstick NEGATIVE NEGATIVE   Bilirubin Urine NEGATIVE NEGATIVE   Ketones, ur NEGATIVE NEGATIVE mg/dL   Protein, ur NEGATIVE NEGATIVE mg/dL   Nitrite NEGATIVE NEGATIVE   Leukocytes, UA NEGATIVE NEGATIVE    Comment: Performed at [place] [address] [place], [place] [address]  MRSA PCR Screening     Status: None   Collection Time: 02/23/18  9:16 PM  Result Value Ref Range   MRSA by PCR NEGATIVE NEGATIVE    Comment:        The GeneXpert MRSA Assay (FDA approved for NASAL specimens only), is one component of a comprehensive MRSA colonization surveillance program. It is not intended to diagnose MRSA infection nor to guide or monitor treatment for MRSA infections. Performed at [facility], [place] [address], [place], [place] [address]   CBC     Status: Abnormal   Collection Time: 02/24/18  2:55 AM  Result Value Ref Range   WBC 6.4 4.0 - 10.5 K/uL   RBC 3.24 (L) 3.87 - 5.11 MIL/uL   Hemoglobin 8.4 (L) 12.0 - 15.0 g/dL   HCT 27.3 (L) 36.0 - 46.0 %   MCV 84.3 78.0 - 100.0 fL   MCH 25.9 (L) 26.0 - 34.0 pg   MCHC  30.8 30.0 - 36.0 g/dL   RDW 17.1 (H) 11.5 - 15.5 %   Platelets 196 150 - 400 K/uL    Comment: Performed at [facility], [place] [address] [place] [address], [place], [place] [address]  CBC     Status: Abnormal   Collection Time: 02/25/18  5:14 AM  Result Value Ref Range   WBC 5.7 4.0 - 10.5 K/uL   RBC 3.23 (L) 3.87 - 5.11 MIL/uL   Hemoglobin 8.6 (L) 12.0 - 15.0 g/dL   HCT 27.8 (L) 36.0 - 46.0 %   MCV 86.1 78.0 - 100.0 fL   MCH 26.6 26.0 - 34.0 pg   MCHC 30.9 30.0 - 36.0 g/dL   RDW 17.6 (H) 11.5 - 15.5 %   Platelets 183 150 - 400 K/uL    Comment: Performed at [facility], [place] [address], [place], [place] [address]  Comprehensive metabolic panel     Status: Abnormal   Collection Time: 02/25/18  5:14 AM  Result Value Ref Range   Sodium 140 135 - 145 mmol/L   Potassium 4.4 3.5 - 5.1 mmol/L    Comment: DELTA CHECK NOTED REPEATED TO VERIFY    Chloride 105 98 - 111 mmol/L   CO2 24 22 - 32  mmol/L   Glucose, Bld 122 (H) 70 - 99 mg/dL   BUN 9 6 - 20 mg/dL   Creatinine, Ser 0.54 0.44 - 1.00 mg/dL   Calcium 9.3 8.9 - 10.3 mg/dL   Total Protein 7.5 6.5 - 8.1 g/dL   Albumin 3.7 3.5 - 5.0 g/dL   AST 34 15 - 41 U/L   ALT 23 0 - 44 U/L   Alkaline Phosphatase 147 (H) 38 - 126 U/L   Total Bilirubin 0.6 0.3 - 1.2 mg/dL   GFR calc non Af Amer >60 >60 mL/min   GFR calc Af Amer >60 >60 mL/min    Comment: (NOTE) The eGFR has been calculated using the CKD EPI equation. This calculation has not been validated in all clinical situations. eGFR's persistently <60 mL/min signify possible Chronic Kidney Disease.    Anion gap 11 5 - 15    Comment: Performed at [facility], [place] [address] [place], [place] [address]  Ammonia     Status: None   Collection Time: 02/25/18  5:14 AM  Result Value Ref Range   Ammonia 22 9 - 35 umol/L    Comment: Performed at [facility], [place] [address], [place], [place] [address]  Protime-INR     Status: None    Collection Time: 02/25/18  5:14 AM  Result Value Ref Range   Prothrombin Time 14.8 11.4 - 15.2 seconds   INR 1.17     Comment: Performed at [facility], [place] [address], [place], [place] [address]    Assessment/Plan: 1. Alcoholic cirrhosis of liver without ascites ([place]) Stable per labs and recent CT. Will have her continue care as directed by GI. Continue alcohol cessation. Recheck labs today. - CBC w/Diff - Basic metabolic panel  2. Gastrointestinal hemorrhage, unspecified gastrointestinal hemorrhage type 2/2 esophageal varcies from alcohol abuse. Now asymptomatic. Repeat labs today to assess. - CBC w/Diff  3. Alcohol abuse Complete cessation x 2 weeks. Has good support system currently. No withdrawal symptoms or cravings. Will monitor closely.  4. Cigarette nicotine dependence without complication Continue cessation efforts with Nicoderm  5. Iron deficiency anemia due to chronic blood loss Repeat CBC today. - CBC w/Diff  6. Tinea corporis Start Lotrisone cream BID. Supportive measures and OTC medications reviewed. - clotrimazole-betamethasone (LOTRISONE) cream; Apply 1 application topically 2 (two) times daily.  Dispense: 30 g; Refill: 0  7. Chronic bronchitis, unspecified chronic bronchitis type ([place]) Will continue with smoking cessation efforts. Referral to Pulmonology placed for PFTs. - Ambulatory referral to Pulmonology  8. Need for immunization against influenza Immunization updated today. - Flu Vaccine QUAD 36+ mos IM   Leeanne Rio, PA-C

## 2018-03-08 NOTE — Patient Instructions (Signed)
Please go to the lab today for blood work.  I will call you with your results. We will alter treatment regimen(s) if indicated by your results.   You will be contacted for assessment by Pulmonology.  Make sure to go to this appointment.   Follow-up with Dr. Carlean Purl as scheduled.   Start the Lotrisone cream twice daily for rash. Follow-up if symptoms are not improving/resolving over the net 10 days.

## 2018-03-10 ENCOUNTER — Other Ambulatory Visit: Payer: Self-pay | Admitting: Physician Assistant

## 2018-03-10 ENCOUNTER — Telehealth: Payer: Self-pay | Admitting: Physician Assistant

## 2018-03-10 ENCOUNTER — Other Ambulatory Visit: Payer: Self-pay | Admitting: Emergency Medicine

## 2018-03-10 DIAGNOSIS — D500 Iron deficiency anemia secondary to blood loss (chronic): Secondary | ICD-10-CM

## 2018-03-10 MED ORDER — FLUTICASONE PROPIONATE 50 MCG/ACT NA SUSP: 2.0000 | Freq: Every day | NASAL | 6 refills | Status: DC

## 2018-03-10 NOTE — Telephone Encounter (Signed)
Can use Benadryl with the Lotrisone but this can cause drowsiness so she needs to be mindful.  Ok to refill Flonase.

## 2018-03-10 NOTE — Telephone Encounter (Signed)
Copied from [place] [id number]. Topic: Quick Communication - See Telephone Encounter >> Mar 10, 2018 10:29 AM Ahmed Prima L wrote: CRM for notification. See Telephone encounter for: 03/10/18.  Patient would like to know if she can take a benadryl along with her clotrimazole-betamethasone (LOTRISONE) cream. She said that she is still itching pretty bad all over. Please advise. Call back # [phone]. She said that the pharmacy is sending a request for " fluticasone (FLONASE) 50 MCG/ACT nasal spray as well and wanted Einar Pheasant to be aware because he asked if her she was still using it and she said no but she said she is ready to start using it again.

## 2018-03-10 NOTE — Telephone Encounter (Signed)
Advised patient she can take Benadryl with Lotrisone but can cause drowsiness.  Refilled the Flonase.

## 2018-03-10 NOTE — Telephone Encounter (Signed)
Please advise about medication

## 2018-03-11 ENCOUNTER — Other Ambulatory Visit: Payer: Self-pay | Admitting: Physician Assistant

## 2018-03-17 ENCOUNTER — Other Ambulatory Visit: Payer: Self-pay

## 2018-03-22 ENCOUNTER — Other Ambulatory Visit: Payer: Self-pay | Admitting: Physician Assistant

## 2018-03-22 ENCOUNTER — Institutional Professional Consult (permissible substitution): Payer: Self-pay | Admitting: Internal Medicine

## 2018-03-22 NOTE — Telephone Encounter (Signed)
Last OV 03/08/2018, No showed appt 03/17/18, No future OV  Last filled 02/22/18, # 90 with 0 refills

## 2018-03-25 ENCOUNTER — Other Ambulatory Visit: Payer: Self-pay | Admitting: Physician Assistant

## 2018-03-29 ENCOUNTER — Other Ambulatory Visit: Payer: Self-pay | Admitting: Physician Assistant

## 2018-03-29 NOTE — Telephone Encounter (Signed)
Last OV 03/08/18, No future OV  Last filled by historical provider 03/03/18

## 2018-03-31 ENCOUNTER — Other Ambulatory Visit: Payer: Self-pay | Admitting: Physician Assistant

## 2018-03-31 DIAGNOSIS — B354 Tinea corporis: Secondary | ICD-10-CM

## 2018-03-31 NOTE — Telephone Encounter (Signed)
Last OV 03/08/18, No future OV  Last filled 03/08/18, 30 g with 0 refills  Continue? Decline?

## 2018-04-06 ENCOUNTER — Other Ambulatory Visit: Payer: Self-pay | Admitting: Physician Assistant

## 2018-04-06 ENCOUNTER — Telehealth: Payer: Self-pay | Admitting: Physician Assistant

## 2018-04-06 MED ORDER — PROPRANOLOL HCL 20 MG PO TABS: 20.0000 mg | ORAL_TABLET | Freq: Two times a day (BID) | ORAL | 0 refills | Status: DC

## 2018-04-06 MED ORDER — FOLIC ACID 1 MG PO TABS: 1.0000 mg | ORAL_TABLET | Freq: Every day | ORAL | 0 refills | Status: DC

## 2018-04-06 MED ORDER — ALBUTEROL SULFATE HFA 108 (90 BASE) MCG/ACT IN AERS: 2.0000 | INHALATION_SPRAY | Freq: Four times a day (QID) | RESPIRATORY_TRACT | 0 refills | Status: DC | PRN

## 2018-04-06 NOTE — Telephone Encounter (Signed)
Folic acid refill Last Refill:02/26/18 # 30 Last OV: 03/08/18 (pt has upcoming appt 04/11/18) PCP: [name] PA Pharmacy:[facility] [phone]  Albuterol inhaler refill Last Refill:02/25/18 # 7 inhaler No RF Last OV: was prescribed at discharge.    propanolol refill Last Refill:02/25/17 # 60 tab  Last OV: 03/08/18  Pt given partial refill until appt

## 2018-04-06 NOTE — Telephone Encounter (Signed)
Last OV: 03/08/2018 Last Fill: 03/11/2018  Given 210 pills on 03/11/2018  Please advise.   Doloris Hall,  LPN

## 2018-04-06 NOTE — Telephone Encounter (Signed)
Copied from [place] [phone] [phone]. Topic: Quick Communication - Rx Refill/Question >> Apr 06, 2018 12:10 PM Sheran Luz wrote: Medication: folic acid (FOLVITE) 1 MG tablet [[id number]], albuterol (PROVENTIL HFA;VENTOLIN HFA) 108 (90 Base) MCG/ACT inhaler and propranolol (INDERAL) 20 MG tablet [[id number]]  Has the patient contacted their pharmacy? Yes   Pt is requesting a refill of these medications. Scheduled pt for appointment on 9/23.   Preferred Pharmacy (with phone number or street name):  [id number] - [place], [place] - [address] [place] [address] [place] [address] [place] [address]  [phone] (Phone) [phone] (Fax)

## 2018-04-11 ENCOUNTER — Ambulatory Visit: Payer: BLUE CROSS/BLUE SHIELD | Admitting: Physician Assistant

## 2018-04-12 ENCOUNTER — Ambulatory Visit (INDEPENDENT_AMBULATORY_CARE_PROVIDER_SITE_OTHER): Payer: BLUE CROSS/BLUE SHIELD | Admitting: Physician Assistant

## 2018-04-12 ENCOUNTER — Other Ambulatory Visit: Payer: Self-pay

## 2018-04-12 ENCOUNTER — Encounter: Payer: Self-pay | Admitting: Physician Assistant

## 2018-04-12 VITALS — BP 118/80 | HR 81 | Temp 98.7°F | Resp 14 | Ht 62.0 in | Wt 141.0 lb

## 2018-04-12 DIAGNOSIS — F32A Depression, unspecified: Secondary | ICD-10-CM

## 2018-04-12 DIAGNOSIS — F419 Anxiety disorder, unspecified: Secondary | ICD-10-CM | POA: Diagnosis not present

## 2018-04-12 DIAGNOSIS — F329 Major depressive disorder, single episode, unspecified: Secondary | ICD-10-CM

## 2018-04-12 DIAGNOSIS — K7030 Alcoholic cirrhosis of liver without ascites: Secondary | ICD-10-CM | POA: Diagnosis not present

## 2018-04-12 LAB — HEPATIC FUNCTION PANEL
ALK PHOS: 134 U/L — AB (ref 39–117)
ALT: 29 U/L (ref 0–35)
AST: 50 U/L — ABNORMAL HIGH (ref 0–37)
Albumin: 4.4 g/dL (ref 3.5–5.2)
BILIRUBIN DIRECT: 0.2 mg/dL (ref 0.0–0.3)
Total Bilirubin: 0.7 mg/dL (ref 0.2–1.2)
Total Protein: 7.7 g/dL (ref 6.0–8.3)

## 2018-04-12 LAB — CBC WITH DIFFERENTIAL/PLATELET
BASOS PCT: 1.1 % (ref 0.0–3.0)
Basophils Absolute: 0.1 10*3/uL (ref 0.0–0.1)
EOS ABS: 0.2 10*3/uL (ref 0.0–0.7)
EOS PCT: 2.1 % (ref 0.0–5.0)
HEMATOCRIT: 31.4 % — AB (ref 36.0–46.0)
HEMOGLOBIN: 10.1 g/dL — AB (ref 12.0–15.0)
LYMPHS PCT: 21.6 % (ref 12.0–46.0)
Lymphs Abs: 1.8 10*3/uL (ref 0.7–4.0)
MCHC: 32.1 g/dL (ref 30.0–36.0)
MCV: 78.8 fl (ref 78.0–100.0)
MONOS PCT: 8.8 % (ref 3.0–12.0)
Monocytes Absolute: 0.7 10*3/uL (ref 0.1–1.0)
Neutro Abs: 5.6 10*3/uL (ref 1.4–7.7)
Neutrophils Relative %: 66.4 % (ref 43.0–77.0)
Platelets: 252 10*3/uL (ref 150.0–400.0)
RBC: 3.98 Mil/uL (ref 3.87–5.11)
RDW: 21.7 % — AB (ref 11.5–15.5)
WBC: 8.5 10*3/uL (ref 4.0–10.5)

## 2018-04-12 LAB — AMMONIA: Ammonia: 50 umol/L — ABNORMAL HIGH (ref 11–35)

## 2018-04-12 MED ORDER — FLUOXETINE HCL 40 MG PO CAPS: 40.0000 mg | ORAL_CAPSULE | Freq: Every day | ORAL | 3 refills | Status: DC

## 2018-04-12 NOTE — Progress Notes (Signed)
Patient presents to clinic today for follow-up of anxiety and depression. Is currently on a regimen of Fluoxetine 40 mg daily. Has been taking as directed and doing well. Notes some breakthrough anxiety over the past month. States she noted the other day that the current pill bottle she is using is an old expired Rx. Denies SI/HI.   Past Medical History:  Diagnosis Date  . Alcoholic hepatitis   . Aortic atherosclerosis ([place])   . Arthritis    neck  . Carpal tunnel syndrome on both sides 02/2012  . Cigarette nicotine dependence   . Contact lens/glasses fitting    wears contacts or glasses  . Dental crowns present    x 2 upper front  . Elevated LFTs   . Fatty liver   . Fluid retention   . History of MRSA infection   . Hypertension   . Insomnia   . Migraines   . Osteoarthritis   . Parasomnia   . Wears partial dentures    lower    Current Outpatient Medications on File Prior to Visit  Medication Sig Dispense Refill  . albuterol (PROVENTIL HFA;VENTOLIN HFA) 108 (90 Base) MCG/ACT inhaler Inhale 2 puffs into the lungs every 6 (six) hours as needed for wheezing or shortness of breath. 1 Inhaler 0  . busPIRone (BUSPAR) 15 MG tablet TAKE 1 TABLET(15 MG) BY MOUTH TWICE DAILY 60 tablet 5  . clotrimazole-betamethasone (LOTRISONE) cream Apply 1 application topically 2 (two) times daily. 30 g 0  . cyclobenzaprine (FLEXERIL) 10 MG tablet TAKE 1 TABLET BY MOUTH TWICE DAILY TO THREE TIMES DAILY AS NEEDED FOR MUSCLE SPASMS 90 tablet 0  . fluticasone (FLONASE) 50 MCG/ACT nasal spray Place 2 sprays into both nostrils daily. 16 g 6  . folic acid (FOLVITE) 1 MG tablet Take 1 tablet (1 mg total) by mouth daily. 15 tablet 0  . gabapentin (NEURONTIN) 100 MG capsule TAKE 1 CAPSULE BY MOUTH EVERY MORNING, NOON AND AT NIGHT WITH THE 300 MG CAPSULE 90 capsule 0  . gabapentin (NEURONTIN) 300 MG capsule TAKE 2 CAPSULES BY MOUTH EVERY MORNING AND NOON THEN 3 CAPSULES EVERY EVENING 210 capsule 0  .  hydrochlorothiazide (HYDRODIURIL) 25 MG tablet Take 25 mg by mouth daily.    [name] lactulose (CHRONULAC) 10 GM/15ML solution Take 30 mLs (20 g total) by mouth daily. 946 mL 3  . ondansetron (ZOFRAN-ODT) 4 MG disintegrating tablet DISSOLVE 1 TABLET(4 MG) ON THE TONGUE EVERY 8 HOURS AS NEEDED FOR NAUSEA OR VOMITING 20 tablet 2  . pantoprazole (PROTONIX) 40 MG tablet Take 1 tablet (40 mg total) by mouth 2 (two) times daily before a meal. 90 tablet 3  . potassium chloride SA (K-DUR,KLOR-CON) 20 MEQ tablet Take 20 mEq by mouth 2 (two) times daily.    . propranolol (INDERAL) 20 MG tablet Take 1 tablet (20 mg total) by mouth 2 (two) times daily. 30 tablet 0  . alendronate (FOSAMAX) 70 MG tablet Take with a full glass of water on an empty stomach. STOP FOR NOW 02/23/2018 (Patient not taking: Reported on 03/08/2018) 12 tablet 0   No current facility-administered medications on file prior to visit.     No Known Allergies  Family History  Problem Relation Age of Onset  . Diabetes Mother   . Hypertension Mother   . Colon polyps Sister   . Hypertension Sister   . Diabetes Sister   . Lung cancer Maternal Grandmother   . Colon cancer Neg Hx   .  Esophageal cancer Neg Hx   . Pancreatic cancer Neg Hx   . Stomach cancer Neg Hx   . Liver disease Neg Hx     Social History   Socioeconomic History  . Marital status: Married    Spouse name: Not on file  . Number of children: Not on file  . Years of education: Not on file  . Highest education level: Not on file  Occupational History  . Occupation: Education administrator  Social Needs  . Financial resource strain: Not on file  . Food insecurity:    Worry: Not on file    Inability: Not on file  . Transportation needs:    Medical: Not on file    Non-medical: Not on file  Tobacco Use  . Smoking status: Current Every Day Smoker    Packs/day: 0.50    Years: 10.00    Pack years: 5.00    Types: Cigarettes  . Smokeless tobacco: Never Used  Substance and Sexual  Activity  . Alcohol use: Yes    Comment: 2 Mikes hard drinks; now has only occasional as of 11/18/16   . Drug use: No  . Sexual activity: Not on file  Lifestyle  . Physical activity:    Days per week: Not on file    Minutes per session: Not on file  . Stress: Not on file  Relationships  . Social connections:    Talks on phone: Not on file    Gets together: Not on file    Attends religious service: Not on file    Active member of club or organization: Not on file    Attends meetings of clubs or organizations: Not on file    Relationship status: Not on file  Other Topics Concern  . 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  All other ROS are negative.  BP 118/80   Pulse 81   Temp 98.7 F (37.1 C) (Oral)   Resp 14   Ht '5\' 2"'$  (1.575 m)   Wt 141 lb (64 kg)   SpO2 98%   BMI 25.79 kg/m   Physical Exam  Constitutional: She appears well-developed and well-nourished.  HENT:  Head: Normocephalic and atraumatic.  Cardiovascular: Normal rate, regular rhythm, normal heart sounds and intact distal pulses.  Pulmonary/Chest: Effort normal and breath sounds normal. No stridor. No respiratory distress. She has no wheezes. She has no rales. She exhibits no tenderness.  Vitals reviewed.   Recent Results (from the past 2160 hour(s))  Comprehensive metabolic panel     Status: Abnormal   Collection Time: 02/23/18 12:10 PM  Result Value Ref Range   Sodium 136 135 - 145 mEq/L   Potassium 4.2 3.5 - 5.1 mEq/L   Chloride 103 96 - 112 mEq/L   CO2 26 19 - 32 mEq/L   Glucose, Bld 112 (H) 70 - 99 mg/dL   BUN 7 6 - 23 mg/dL   Creatinine, Ser 0.54 0.40 - 1.20 mg/dL   Total Bilirubin 0.6 0.2 - 1.2 mg/dL   Alkaline Phosphatase 157 (H) 39 - 117 U/L   AST 31 0 - 37 U/L   ALT 19 0 - 35 U/L   Total  Protein 7.8 6.0 - 8.3 g/dL   Albumin 4.3 3.5 -  5.2 g/dL   Calcium 9.6 8.4 - 10.5 mg/dL   GFR 126.17 >60.00 mL/min  CBC with Differential/Platelet     Status: Abnormal   Collection Time: 02/23/18 12:10 PM  Result Value Ref Range   WBC 8.1 4.0 - 10.5 K/uL   RBC 2.72 (L) 3.87 - 5.11 Mil/uL   Hemoglobin 7.1 Repeated and verified X2. (LL) 12.0 - 15.0 g/dL   HCT 22.3 Repeated and verified X2. (LL) 36.0 - 46.0 %   MCV 82.0 78.0 - 100.0 fl   MCHC 31.6 30.0 - 36.0 g/dL   RDW 20.4 (H) 11.5 - 15.5 %   Platelets 187.0 150.0 - 400.0 K/uL   Neutrophils Relative % 72.9 43.0 - 77.0 %   Lymphocytes Relative 14.5 12.0 - 46.0 %   Monocytes Relative 10.7 3.0 - 12.0 %   Eosinophils Relative 1.1 0.0 - 5.0 %   Basophils Relative 0.8 0.0 - 3.0 %   Neutro Abs 5.9 1.4 - 7.7 K/uL   Lymphs Abs 1.2 0.7 - 4.0 K/uL   Monocytes Absolute 0.9 0.1 - 1.0 K/uL   Eosinophils Absolute 0.1 0.0 - 0.7 K/uL   Basophils Absolute 0.1 0.0 - 0.1 K/uL  Protime-INR     Status: Abnormal   Collection Time: 02/23/18 12:10 PM  Result Value Ref Range   INR 1.3 (H) 0.8 - 1.0 ratio   Prothrombin Time 14.7 (H) 9.6 - 13.1 sec  CBG monitoring, ED     Status: Abnormal   Collection Time: 02/23/18  5:47 PM  Result Value Ref Range   Glucose-Capillary 102 (H) 70 - 99 mg/dL  Basic metabolic panel     Status: Abnormal   Collection Time: 02/23/18  5:49 PM  Result Value Ref Range   Sodium 139 135 - 145 mmol/L   Potassium 3.6 3.5 - 5.1 mmol/L   Chloride 103 98 - 111 mmol/L   CO2 27 22 - 32 mmol/L   Glucose, Bld 102 (H) 70 - 99 mg/dL   BUN 10 6 - 20 mg/dL   Creatinine, Ser 0.61 0.44 - 1.00 mg/dL   Calcium 9.6 8.9 - 10.3 mg/dL   GFR calc non Af Amer >60 >60 mL/min   GFR calc Af Amer >60 >60 mL/min    Comment: (NOTE) The eGFR has been calculated using the CKD EPI equation. This calculation has not been validated in all clinical situations. eGFR's persistently <60 mL/min signify possible Chronic Kidney Disease.    Anion gap 9 5 - 15     Comment: Performed at [facility], [place] [address], [place], [place] [address]  CBC     Status: Abnormal   Collection Time: 02/23/18  5:49 PM  Result Value Ref Range   WBC 9.4 4.0 - 10.5 K/uL   RBC 2.78 (L) 3.87 - 5.11 MIL/uL   Hemoglobin 7.1 (L) 12.0 - 15.0 g/dL   HCT 23.8 (L) 36.0 - 46.0 %   MCV 85.6 78.0 - 100.0 fL   MCH 25.5 (L) 26.0 - 34.0 pg   MCHC 29.8 (L) 30.0 - 36.0 g/dL   RDW 18.4 (H) 11.5 - 15.5 %   Platelets 219 150 - 400 K/uL    Comment: Performed at [facility], [place] [address] [place], [place] [address]  Type and screen [place]     Status: None   Collection Time: 02/23/18  6:59 PM  Result Value Ref Range   ABO/RH(D) A NEG    Antibody Screen NEG  Sample Expiration 02/26/2018    Unit Number [id number]    Blood Component Type RED CELLS,LR    Unit division 00    Status of Unit REL FROM [facility]    Transfusion Status OK TO TRANSFUSE    Crossmatch Result      Compatible Performed at [place] [address] [place], [place] [address]    Unit Number [id number]    Blood Component Type RED CELLS,LR    Unit division 00    Status of Unit ISSUED,FINAL    Transfusion Status OK TO TRANSFUSE    Crossmatch Result Compatible   ABO/Rh     Status: None   Collection Time: 02/23/18  6:59 PM  Result Value Ref Range   ABO/RH(D)      A NEG Performed at [place] [address] [place], [place] [address]   BPAM RBC     Status: None   Collection Time: 02/23/18  6:59 PM  Result Value Ref Range   Blood Product Unit Number [id number]    Unit Type and Rh 0600    Blood Product Expiration Date 093267124580    ISSUE DATE / TIME [id number]    Blood Product Unit Number [id number]    PRODUCT CODE [id number]    Unit Type and Rh 0600    Blood Product Expiration Date [id number]   Protime-INR     Status: None   Collection Time: 02/23/18  7:00 PM  Result Value Ref  Range   Prothrombin Time 14.7 11.4 - 15.2 seconds   INR 1.16     Comment: Performed at [facility], [place] [address], [place], [place] [address]  Magnesium     Status: None   Collection Time: 02/23/18  7:00 PM  Result Value Ref Range   Magnesium 2.3 1.7 - 2.4 mg/dL    Comment: Performed at [facility], [place] [address], [place], [place] [address]  Phosphorus     Status: None   Collection Time: 02/23/18  7:00 PM  Result Value Ref Range   Phosphorus 4.1 2.5 - 4.6 mg/dL    Comment: Performed at [facility], [place] [address], [place], [place] [address]  HIV antibody (Routine Testing)     Status: None   Collection Time: 02/23/18  7:00 PM  Result Value Ref Range   HIV Screen 4th Generation wRfx Non Reactive Non Reactive    Comment: (NOTE) Performed At: [facility] [address] [place], [place] [id number] Rush Farmer MD UD:[id number]   Prepare RBC     Status: None   Collection Time: 02/23/18  7:51 PM  Result Value Ref Range   Order Confirmation      ORDER PROCESSED BY BLOOD BANK Performed at [facility], [place] [address], [place], [place] [address]   Urinalysis, Routine w reflex microscopic     Status: Abnormal   Collection Time: 02/23/18  7:55 PM  Result Value Ref Range   Color, Urine STRAW (A) YELLOW   APPearance CLEAR CLEAR   Specific Gravity, Urine 1.002 (L) 1.005 - 1.030   pH 7.0 5.0 - 8.0   Glucose, UA NEGATIVE NEGATIVE mg/dL   Hgb urine dipstick NEGATIVE NEGATIVE   Bilirubin Urine NEGATIVE NEGATIVE   Ketones, ur NEGATIVE NEGATIVE mg/dL   Protein, ur NEGATIVE NEGATIVE mg/dL   Nitrite NEGATIVE NEGATIVE   Leukocytes, UA NEGATIVE NEGATIVE    Comment: Performed at [place] [address] [place],  [place] [address]  MRSA PCR Screening     Status: None   Collection Time: 02/23/18  9:16 PM  Result Value Ref Range   MRSA by PCR NEGATIVE NEGATIVE    Comment:          The GeneXpert MRSA Assay (FDA approved for NASAL specimens only), is one component of a comprehensive MRSA colonization surveillance program. It is not intended to diagnose MRSA infection nor to guide or monitor treatment for MRSA infections. Performed at [facility], [place] [address] [place], [place] [address]   CBC     Status: Abnormal   Collection Time: 02/24/18  2:55 AM  Result Value Ref Range   WBC 6.4 4.0 - 10.5 K/uL   RBC 3.24 (L) 3.87 - 5.11 MIL/uL   Hemoglobin 8.4 (L) 12.0 - 15.0 g/dL   HCT 27.3 (L) 36.0 - 46.0 %   MCV 84.3 78.0 - 100.0 fL   MCH 25.9 (L) 26.0 - 34.0 pg   MCHC 30.8 30.0 - 36.0 g/dL   RDW 17.1 (H) 11.5 - 15.5 %   Platelets 196 150 - 400 K/uL    Comment: Performed at [facility], [place] [address], [place], [place] [address]  CBC     Status: Abnormal   Collection Time: 02/25/18  5:14 AM  Result Value Ref Range   WBC 5.7 4.0 - 10.5 K/uL   RBC 3.23 (L) 3.87 - 5.11 MIL/uL   Hemoglobin 8.6 (L) 12.0 - 15.0 g/dL   HCT 27.8 (L) 36.0 - 46.0 %   MCV 86.1 78.0 - 100.0 fL   MCH 26.6 26.0 - 34.0 pg   MCHC 30.9 30.0 - 36.0 g/dL   RDW 17.6 (H) 11.5 - 15.5 %   Platelets 183 150 - 400 K/uL    Comment: Performed at [facility] [place] [address], [place], [place] [address]  Comprehensive metabolic panel     Status: Abnormal   Collection Time: 02/25/18  5:14 AM  Result Value Ref Range   Sodium 140 135 - 145 mmol/L   Potassium 4.4 3.5 - 5.1 mmol/L    Comment: DELTA CHECK NOTED REPEATED TO VERIFY    Chloride 105 98 - 111 mmol/L   CO2 24 22 - 32 mmol/L   Glucose, Bld 122 (H) 70 - 99 mg/dL   BUN 9 6 - 20 mg/dL   Creatinine, Ser 0.54 0.44 - 1.00 mg/dL   Calcium 9.3 8.9 - 10.3 mg/dL   Total Protein 7.5 6.5 - 8.1 g/dL   Albumin 3.7 3.5 - 5.0 g/dL   AST 34 15 - 41 U/L   ALT 23 0 - 44 U/L   Alkaline Phosphatase 147 (H) 38 - 126 U/L   Total Bilirubin 0.6 0.3 - 1.2 mg/dL   GFR calc non Af Amer >60 >60 mL/min    GFR calc Af Amer >60 >60 mL/min    Comment: (NOTE) The eGFR has been calculated using the CKD EPI equation. This calculation has not been validated in all clinical situations. eGFR's persistently <60 mL/min signify possible Chronic Kidney Disease.    Anion gap 11 5 - 15    Comment: Performed at [facility], [place] [address] [place], [place] [address]  Ammonia     Status: None   Collection Time: 02/25/18  5:14 AM  Result Value Ref Range   Ammonia 22 9 - 35 umol/L    Comment: Performed at [facility], [place]  [address] [place], [place] [address]  Protime-INR     Status: None   Collection Time: 02/25/18  5:14 AM  Result Value Ref Range   Prothrombin Time 14.8 11.4 - 15.2 seconds   INR 1.17     Comment: Performed at [facility], [place] [address] [place], [place] [address]  CBC w/Diff     Status: Abnormal   Collection Time: 03/08/18 11:43 AM  Result Value Ref Range   WBC 6.2 4.0 - 10.5 K/uL   RBC 3.51 (L) 3.87 - 5.11 Mil/uL   Hemoglobin 9.0 (L) 12.0 - 15.0 g/dL   HCT 28.7 (L) 36.0 - 46.0 %   MCV 81.6 78.0 - 100.0 fl   MCHC 31.2 30.0 - 36.0 g/dL   RDW 19.4 (H) 11.5 - 15.5 %   Platelets 191.0 150.0 - 400.0 K/uL   Neutrophils Relative % 70.2 43.0 - 77.0 %   Lymphocytes Relative 18.2 12.0 - 46.0 %   Monocytes Relative 8.2 3.0 - 12.0 %   Eosinophils Relative 2.6 0.0 - 5.0 %   Basophils Relative 0.8 0.0 - 3.0 %   Neutro Abs 4.4 1.4 - 7.7 K/uL   Lymphs Abs 1.1 0.7 - 4.0 K/uL   Monocytes Absolute 0.5 0.1 - 1.0 K/uL   Eosinophils Absolute 0.2 0.0 - 0.7 K/uL   Basophils Absolute 0.0 0.0 - 0.1 K/uL  Basic metabolic panel     Status: Abnormal   Collection Time: 03/08/18 11:43 AM  Result Value Ref Range   Sodium 138 135 - 145 mEq/L   Potassium 4.4 3.5 - 5.1 mEq/L   Chloride 106 96 - 112 mEq/L   CO2 25 19 - 32 mEq/L   Glucose, Bld 123 (H) 70 - 99 mg/dL   BUN 12 6 - 23 mg/dL   Creatinine, Ser 0.67 0.40 - 1.20 mg/dL   Calcium 9.3  8.4 - 10.5 mg/dL   GFR 98.35 >60.00 mL/min    Assessment/Plan: 1. Anxiety and depression Medication refilled. Will have her continue current dose for now, having her use non-expired medication to see if this makes a change. If not improving, will need to alter regimen. - FLUoxetine (PROZAC) 40 MG capsule; Take 1 capsule (40 mg total) by mouth daily.  Dispense: 30 capsule; Refill: 3  2. Alcoholic cirrhosis of liver without ascites (HCC) Recheck ammonia level due to complaints of abdominal bloating noting at end of visit. Follow-up with GI as scheduled. - Ammonia - Hepatic function panel - CBC w/Diff    Leeanne Rio, PA-C

## 2018-04-12 NOTE — Patient Instructions (Signed)
Please go to the lab today for blood work.  I will call you with your results. We will alter treatment regimen(s) if indicated by your results.   Start the new prescription for Prozac taking daily as directed. Keep me updated on mood.

## 2018-04-13 ENCOUNTER — Other Ambulatory Visit: Payer: Self-pay | Admitting: Internal Medicine

## 2018-04-13 NOTE — Telephone Encounter (Signed)
Refill x 3 

## 2018-04-13 NOTE — Telephone Encounter (Signed)
May I refill Sir, she has an appointment with you next week.

## 2018-04-19 ENCOUNTER — Other Ambulatory Visit: Payer: Self-pay | Admitting: Physician Assistant

## 2018-04-20 ENCOUNTER — Ambulatory Visit: Payer: Self-pay | Admitting: Internal Medicine

## 2018-04-25 ENCOUNTER — Other Ambulatory Visit: Payer: Self-pay | Admitting: Physician Assistant

## 2018-04-29 ENCOUNTER — Other Ambulatory Visit: Payer: Self-pay | Admitting: Physician Assistant

## 2018-05-05 ENCOUNTER — Other Ambulatory Visit: Payer: Self-pay | Admitting: Physician Assistant

## 2018-05-17 ENCOUNTER — Telehealth: Payer: Self-pay | Admitting: Physician Assistant

## 2018-05-19 ENCOUNTER — Other Ambulatory Visit: Payer: Self-pay | Admitting: Physician Assistant

## 2018-05-19 NOTE — Telephone Encounter (Signed)
Out of medication by tomorrow and patient would like 3 refills on this script. She says she discussed having the refills with Hassell Done.

## 2018-05-20 MED ORDER — CYCLOBENZAPRINE HCL 10 MG PO TABS: 10.0000 mg | ORAL_TABLET | Freq: Three times a day (TID) | ORAL | 1 refills | Status: DC | PRN

## 2018-05-20 NOTE — Telephone Encounter (Signed)
Patient aware.

## 2018-05-20 NOTE — Addendum Note (Signed)
Addended by: [name] on: 05/20/2018 01:34 PM   Modules accepted: Orders

## 2018-05-20 NOTE — Telephone Encounter (Signed)
Patient is calling and states she received this prescription yesterday but it was only called in for 30 tablets. She said if she takes 3 a day that only last her 15 days. Please contact patient.   [phone]

## 2018-05-20 NOTE — Telephone Encounter (Signed)
My apologies. Rx has been corrected and resent to pharmacy.

## 2018-06-01 ENCOUNTER — Other Ambulatory Visit: Payer: Self-pay | Admitting: Physician Assistant

## 2018-06-08 ENCOUNTER — Other Ambulatory Visit: Payer: Self-pay | Admitting: Internal Medicine

## 2018-06-08 NOTE — Telephone Encounter (Signed)
How many refills Sir? 

## 2018-06-10 NOTE — Telephone Encounter (Signed)
Okay to refill per Dr Carlean Purl.

## 2018-06-28 ENCOUNTER — Other Ambulatory Visit: Payer: Self-pay | Admitting: Physician Assistant

## 2018-08-15 ENCOUNTER — Other Ambulatory Visit: Payer: Self-pay | Admitting: Internal Medicine

## 2018-11-04 ENCOUNTER — Other Ambulatory Visit: Payer: Self-pay | Admitting: Physician Assistant

## 2018-11-04 DIAGNOSIS — F419 Anxiety disorder, unspecified: Principal | ICD-10-CM

## 2018-11-04 DIAGNOSIS — F329 Major depressive disorder, single episode, unspecified: Secondary | ICD-10-CM

## 2018-11-04 DIAGNOSIS — F32A Depression, unspecified: Secondary | ICD-10-CM

## 2018-11-04 NOTE — Telephone Encounter (Signed)
LMOVM to call back to schedule a Virtual Visit for Anxiety/Depression with Einar Pheasant

## 2018-11-09 ENCOUNTER — Encounter: Payer: Self-pay | Admitting: Internal Medicine

## 2019-02-16 ENCOUNTER — Other Ambulatory Visit: Payer: Self-pay | Admitting: Physician Assistant

## 2019-02-16 DIAGNOSIS — F329 Major depressive disorder, single episode, unspecified: Secondary | ICD-10-CM

## 2019-02-16 DIAGNOSIS — F32A Depression, unspecified: Secondary | ICD-10-CM

## 2019-05-11 IMAGING — CT CT ABD-PEL WO/W CM
2 of 11 series · 10 of 46 positions shown, 16 images · IV contrast (ISOVUE 300)
Comparison: No priors.

CLINICAL DATA: 50-year-old female with history of elevated liver
function tests. Nausea, bloating and decreased appetite. 10 pound
weight loss over the past several months. History of alcohol abuse.
Evaluate for cirrhosis or liver lesions.

EXAM:
CT ABDOMEN AND PELVIS WITHOUT AND WITH CONTRAST
TECHNIQUE: Multidetector CT imaging of the abdomen and pelvis was performed
following the standard protocol before and following the bolus
administration of intravenous contrast.
CONTRAST:  100mL 54HKOR-FKK IOPAMIDOL (54HKOR-FKK) INJECTION 61%

[Series 7: venous phase 3.0 b30f · axial · portal-venous · 0.68mm/px · z∈[-409,-85]mm · 7 of 144 slices shown, 12 images]
[im 18/144  soft-tissue]
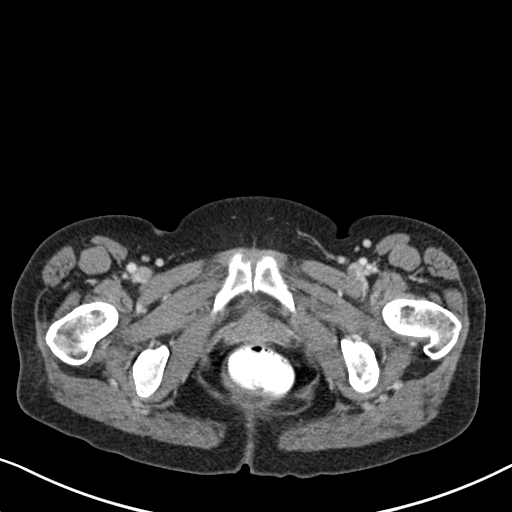
[im 18/144  bone]
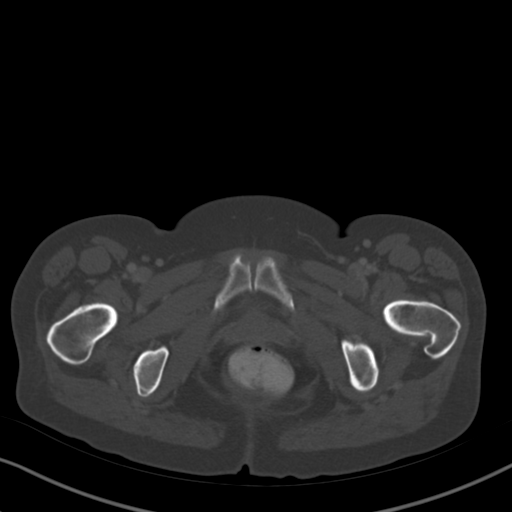
[im 36/144  soft-tissue]
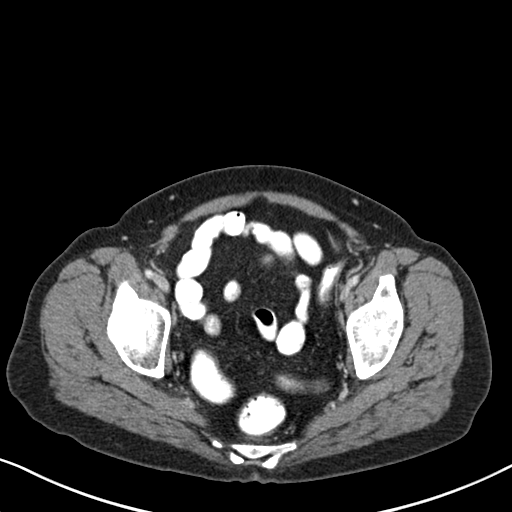
[im 54/144  soft-tissue]
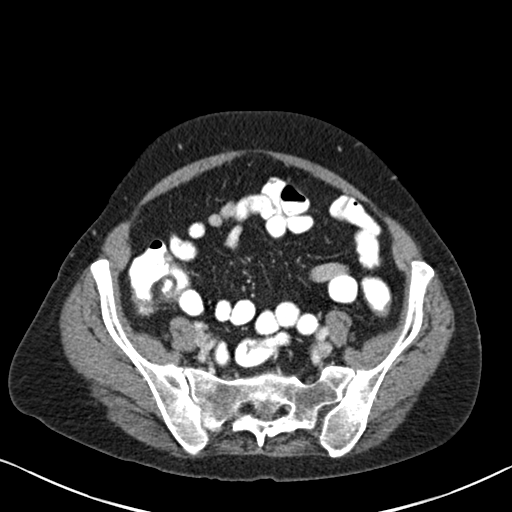
[im 72/144  soft-tissue]
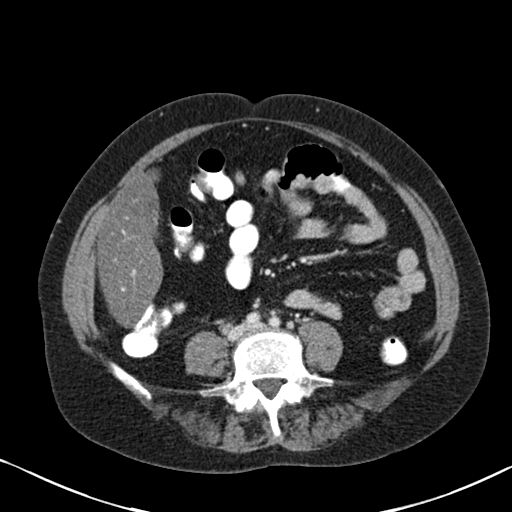
[im 72/144  lung]
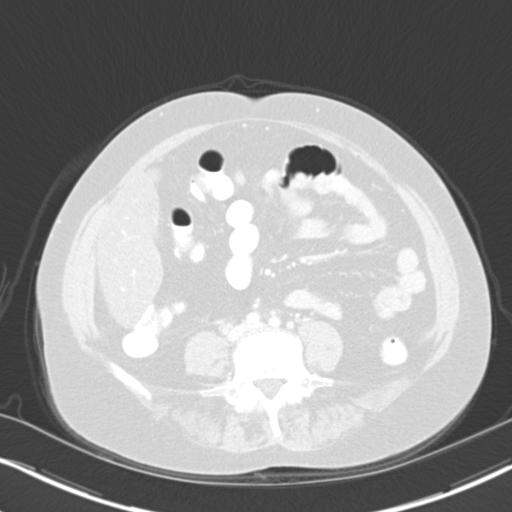
[im 90/144  soft-tissue]
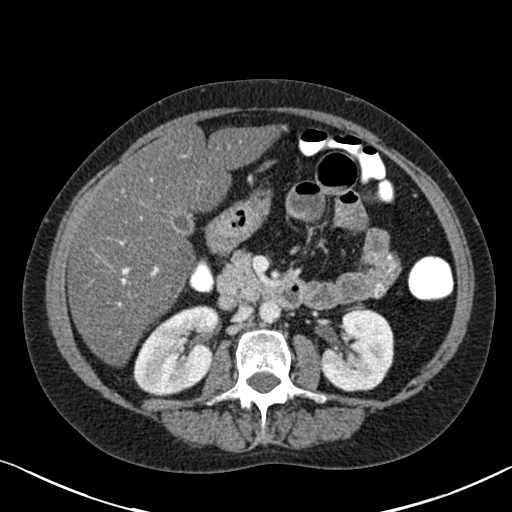
[im 90/144  lung]
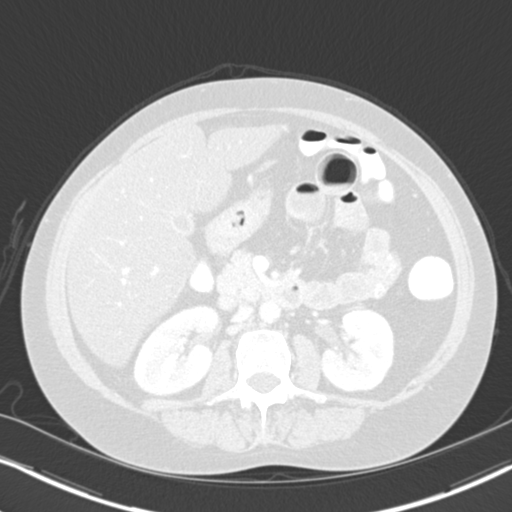
[im 108/144  soft-tissue]
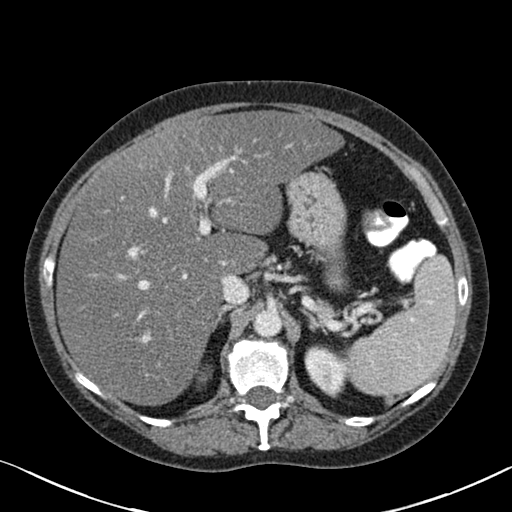
[im 108/144  lung]
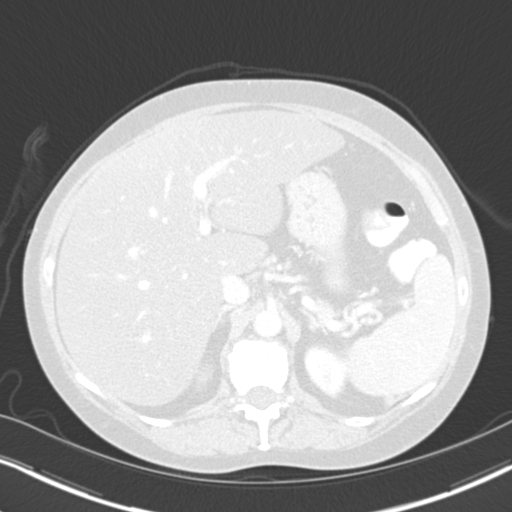
[im 126/144  soft-tissue]
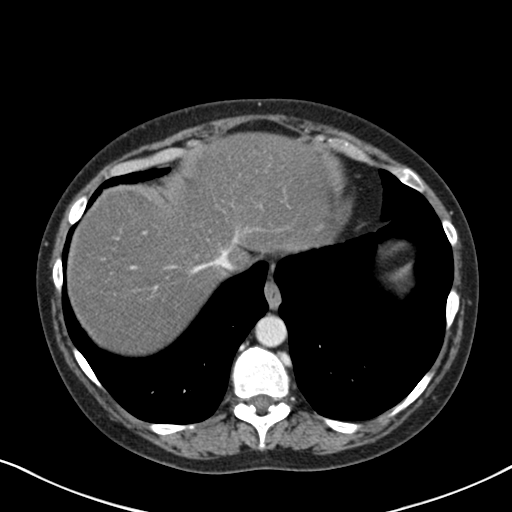
[im 126/144  lung]
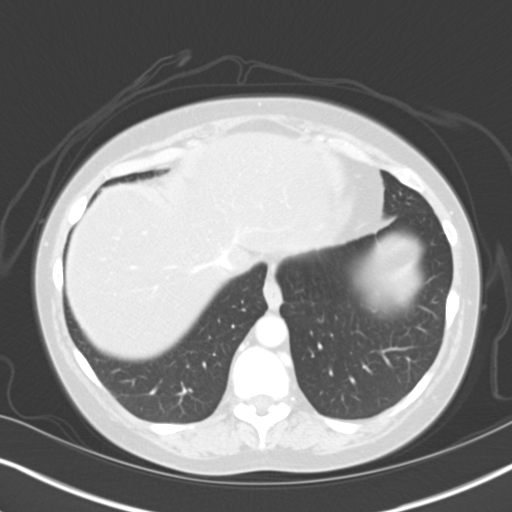

[Series 13: venous coronal · coronal · portal-venous · 0.60mm/px · 3 of 135 slices shown, 4 images]
[im 27/135  soft-tissue]
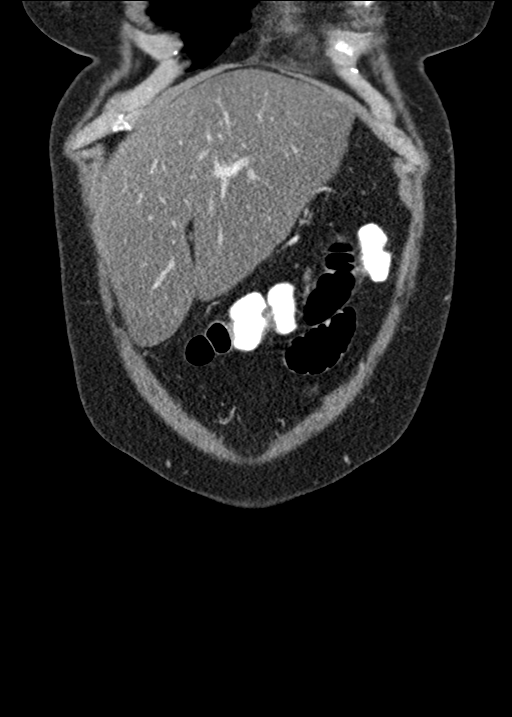
[im 54/135  soft-tissue]
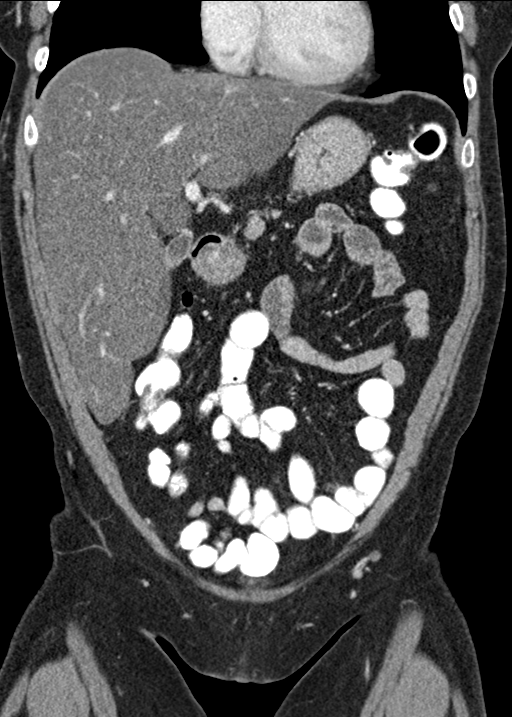
[im 54/135  bone]
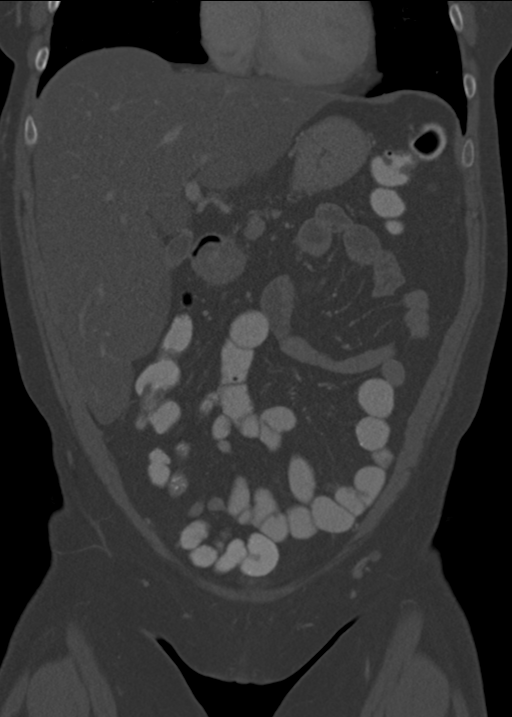
[im 81/135  soft-tissue]
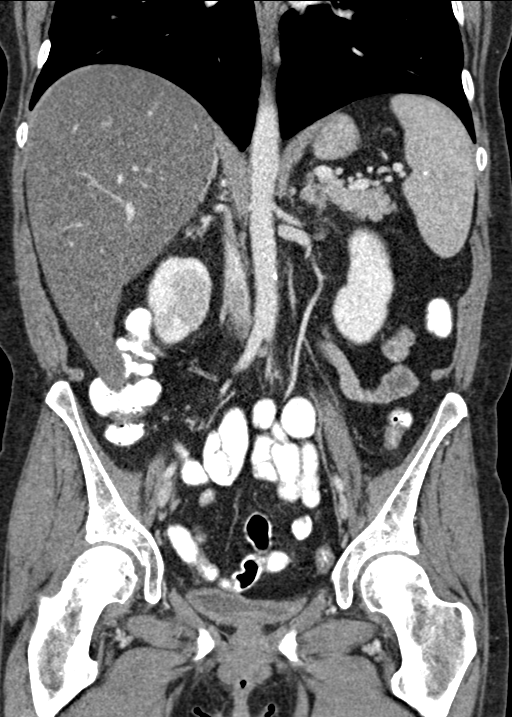

[10 of 46 positions shown; findings below may reference images not displayed]

FINDINGS: Lower chest: Multiple healed and healing posterior rib fractures
bilaterally.

Hepatobiliary: Diffuse low attenuation throughout the hepatic
parenchyma, compatible with hepatic steatosis. Contour of the liver
is slightly nodular, suggestive of underlying cirrhosis. No discrete
cystic or solid hepatic lesion is identified. No intra or
extrahepatic biliary ductal dilatation. Gallbladder is normal in
appearance.

Pancreas: No pancreatic mass. No pancreatic ductal dilatation. No
pancreatic or peripancreatic fluid or inflammatory changes.

Spleen: Calcified granuloma in the splenic hilum. Otherwise,
unremarkable.

Adrenals/Urinary Tract: Bilateral kidneys and bilateral adrenal
glands are normal in appearance. There is no hydroureteronephrosis.
Urinary bladder is nearly decompressed, but otherwise unremarkable
in appearance.

Stomach/Bowel: The appearance of the stomach is normal. There is no
pathologic dilatation of small bowel or colon. Normal appendix.

Vascular/Lymphatic: Aortic atherosclerosis, without evidence of
aneurysm or dissection in the abdominal or pelvic vasculature. No
lymphadenopathy noted in the abdomen or pelvis.

Reproductive: Status post hysterectomy.  Ovaries are atrophic.

Other: No significant volume of ascites.  No pneumoperitoneum.

Musculoskeletal: There are no aggressive appearing lytic or blastic
lesions noted in the visualized portions of the skeleton.
IMPRESSION: 1. Severe hepatic steatosis.
2. Nodular contour of the liver suggestive of early changes of
cirrhosis.
3. No discrete hypervascular hepatic lesion identified on today's
examination to suggest underlying hepatocellular carcinoma.
4. Aortic atherosclerosis.
5. Multiple posterior healed and healing rib fractures bilaterally.

## 2019-09-16 ENCOUNTER — Other Ambulatory Visit: Payer: Self-pay

## 2019-09-16 ENCOUNTER — Inpatient Hospital Stay (HOSPITAL_COMMUNITY)
Admission: EM | Admit: 2019-09-16 | Discharge: 2019-09-20 | DRG: 897 | Disposition: A | Discharge status: Home / Self Care | Payer: BLUE CROSS/BLUE SHIELD | Attending: Internal Medicine | Admitting: Internal Medicine

## 2019-09-16 ENCOUNTER — Emergency Department (HOSPITAL_COMMUNITY): Payer: BLUE CROSS/BLUE SHIELD

## 2019-09-16 ENCOUNTER — Inpatient Hospital Stay (HOSPITAL_COMMUNITY): Payer: BLUE CROSS/BLUE SHIELD

## 2019-09-16 DIAGNOSIS — Y92009 Unspecified place in unspecified non-institutional (private) residence as the place of occurrence of the external cause: Secondary | ICD-10-CM

## 2019-09-16 DIAGNOSIS — F329 Major depressive disorder, single episode, unspecified: Secondary | ICD-10-CM | POA: Diagnosis present

## 2019-09-16 DIAGNOSIS — D62 Acute posthemorrhagic anemia: Secondary | ICD-10-CM | POA: Diagnosis present

## 2019-09-16 DIAGNOSIS — K766 Portal hypertension: Secondary | ICD-10-CM | POA: Diagnosis present

## 2019-09-16 DIAGNOSIS — R7989 Other specified abnormal findings of blood chemistry: Secondary | ICD-10-CM

## 2019-09-16 DIAGNOSIS — Z79899 Other long term (current) drug therapy: Secondary | ICD-10-CM | POA: Diagnosis not present

## 2019-09-16 DIAGNOSIS — F419 Anxiety disorder, unspecified: Secondary | ICD-10-CM | POA: Diagnosis present

## 2019-09-16 DIAGNOSIS — F22 Delusional disorders: Secondary | ICD-10-CM | POA: Diagnosis not present

## 2019-09-16 DIAGNOSIS — J449 Chronic obstructive pulmonary disease, unspecified: Secondary | ICD-10-CM | POA: Diagnosis present

## 2019-09-16 DIAGNOSIS — R680 Hypothermia, not associated with low environmental temperature: Secondary | ICD-10-CM | POA: Diagnosis present

## 2019-09-16 DIAGNOSIS — K7030 Alcoholic cirrhosis of liver without ascites: Secondary | ICD-10-CM | POA: Diagnosis present

## 2019-09-16 DIAGNOSIS — T68XXXA Hypothermia, initial encounter: Secondary | ICD-10-CM

## 2019-09-16 DIAGNOSIS — T512X1A Toxic effect of 2-Propanol, accidental (unintentional), initial encounter: Secondary | ICD-10-CM | POA: Diagnosis not present

## 2019-09-16 DIAGNOSIS — F10231 Alcohol dependence with withdrawal delirium: Secondary | ICD-10-CM | POA: Diagnosis not present

## 2019-09-16 DIAGNOSIS — G9349 Other encephalopathy: Secondary | ICD-10-CM | POA: Diagnosis present

## 2019-09-16 DIAGNOSIS — F1010 Alcohol abuse, uncomplicated: Secondary | ICD-10-CM | POA: Diagnosis not present

## 2019-09-16 DIAGNOSIS — D509 Iron deficiency anemia, unspecified: Secondary | ICD-10-CM | POA: Diagnosis present

## 2019-09-16 DIAGNOSIS — F1020 Alcohol dependence, uncomplicated: Secondary | ICD-10-CM | POA: Diagnosis not present

## 2019-09-16 DIAGNOSIS — I10 Essential (primary) hypertension: Secondary | ICD-10-CM | POA: Diagnosis present

## 2019-09-16 DIAGNOSIS — R000 Tachycardia, unspecified: Secondary | ICD-10-CM | POA: Diagnosis present

## 2019-09-16 DIAGNOSIS — Z20822 Contact with and (suspected) exposure to covid-19: Secondary | ICD-10-CM | POA: Diagnosis present

## 2019-09-16 DIAGNOSIS — N289 Disorder of kidney and ureter, unspecified: Secondary | ICD-10-CM

## 2019-09-16 DIAGNOSIS — S0101XA Laceration without foreign body of scalp, initial encounter: Secondary | ICD-10-CM

## 2019-09-16 DIAGNOSIS — I8510 Secondary esophageal varices without bleeding: Secondary | ICD-10-CM | POA: Diagnosis present

## 2019-09-16 DIAGNOSIS — R4182 Altered mental status, unspecified: Secondary | ICD-10-CM | POA: Diagnosis present

## 2019-09-16 DIAGNOSIS — G92 Toxic encephalopathy: Secondary | ICD-10-CM | POA: Diagnosis not present

## 2019-09-16 DIAGNOSIS — F1910 Other psychoactive substance abuse, uncomplicated: Secondary | ICD-10-CM | POA: Diagnosis not present

## 2019-09-16 DIAGNOSIS — Y92000 Kitchen of unspecified non-institutional (private) residence as  the place of occurrence of the external cause: Secondary | ICD-10-CM | POA: Diagnosis not present

## 2019-09-16 DIAGNOSIS — E872 Acidosis: Secondary | ICD-10-CM | POA: Diagnosis present

## 2019-09-16 DIAGNOSIS — Z7951 Long term (current) use of inhaled steroids: Secondary | ICD-10-CM

## 2019-09-16 DIAGNOSIS — E860 Dehydration: Secondary | ICD-10-CM | POA: Diagnosis present

## 2019-09-16 DIAGNOSIS — D619 Aplastic anemia, unspecified: Secondary | ICD-10-CM | POA: Diagnosis not present

## 2019-09-16 DIAGNOSIS — T510X1A Toxic effect of ethanol, accidental (unintentional), initial encounter: Secondary | ICD-10-CM | POA: Diagnosis not present

## 2019-09-16 DIAGNOSIS — E512 Wernicke's encephalopathy: Secondary | ICD-10-CM | POA: Diagnosis present

## 2019-09-16 DIAGNOSIS — F10239 Alcohol dependence with withdrawal, unspecified: Secondary | ICD-10-CM | POA: Diagnosis present

## 2019-09-16 DIAGNOSIS — K3189 Other diseases of stomach and duodenum: Secondary | ICD-10-CM | POA: Diagnosis not present

## 2019-09-16 DIAGNOSIS — W19XXXA Unspecified fall, initial encounter: Secondary | ICD-10-CM | POA: Diagnosis present

## 2019-09-16 DIAGNOSIS — G9340 Encephalopathy, unspecified: Secondary | ICD-10-CM | POA: Diagnosis not present

## 2019-09-16 LAB — SALICYLATE LEVEL: Salicylate Lvl: <7 mg/dL — ABNORMAL LOW (ref 7.0–30.0)

## 2019-09-16 LAB — COMPREHENSIVE METABOLIC PANEL
ALT: 24 U/L (ref 0–44)
ALT: 18 U/L (ref 0–44)
AST: 37 U/L (ref 15–41)
AST: 24 U/L (ref 15–41)
Albumin: 4.4 g/dL (ref 3.5–5.0)
Albumin: 3.6 g/dL (ref 3.5–5.0)
Alkaline Phosphatase: 87 U/L (ref 38–126)
Alkaline Phosphatase: 70 U/L (ref 38–126)
Anion gap: 15 (ref 5–15)
Anion gap: 11 (ref 5–15)
BUN: 17 mg/dL (ref 6–20)
BUN: 14 mg/dL (ref 6–20)
CO2: 18 mmol/L — ABNORMAL LOW (ref 22–32)
CO2: 19 mmol/L — ABNORMAL LOW (ref 22–32)
Calcium: 9.1 mg/dL (ref 8.9–10.3)
Calcium: 8.1 mg/dL — ABNORMAL LOW (ref 8.9–10.3)
Chloride: 106 mmol/L (ref 98–111)
Chloride: 111 mmol/L (ref 98–111)
Creatinine, Ser: 2.36 mg/dL — ABNORMAL HIGH (ref 0.44–1.00)
Creatinine, Ser: 1.79 mg/dL — ABNORMAL HIGH (ref 0.44–1.00)
GFR calc Af Amer: 26 mL/min — ABNORMAL LOW (ref >60)
GFR calc Af Amer: 37 mL/min — ABNORMAL LOW (ref >60)
GFR calc non Af Amer: 23 mL/min — ABNORMAL LOW (ref >60)
GFR calc non Af Amer: 32 mL/min — ABNORMAL LOW (ref >60)
Glucose, Bld: 213 mg/dL — ABNORMAL HIGH (ref 70–99)
Glucose, Bld: 139 mg/dL — ABNORMAL HIGH (ref 70–99)
Potassium: 4 mmol/L (ref 3.5–5.1)
Potassium: 3.7 mmol/L (ref 3.5–5.1)
Sodium: 139 mmol/L (ref 135–145)
Sodium: 141 mmol/L (ref 135–145)
Total Bilirubin: 0.3 mg/dL (ref 0.3–1.2)
Total Bilirubin: 0.6 mg/dL (ref 0.3–1.2)
Total Protein: 7.5 g/dL (ref 6.5–8.1)
Total Protein: 6.1 g/dL — ABNORMAL LOW (ref 6.5–8.1)

## 2019-09-16 LAB — I-STAT CHEM 8, ED
BUN: 19 mg/dL (ref 6–20)
Calcium, Ion: 1.04 mmol/L — ABNORMAL LOW (ref 1.15–1.40)
Chloride: 107 mmol/L (ref 98–111)
Creatinine, Ser: 1 mg/dL (ref 0.44–1.00)
Glucose, Bld: 211 mg/dL — ABNORMAL HIGH (ref 70–99)
HCT: 32 % — ABNORMAL LOW (ref 36.0–46.0)
Hemoglobin: 10.9 g/dL — ABNORMAL LOW (ref 12.0–15.0)
Potassium: 4 mmol/L (ref 3.5–5.1)
Sodium: 141 mmol/L (ref 135–145)
TCO2: 22 mmol/L (ref 22–32)

## 2019-09-16 LAB — I-STAT BETA HCG BLOOD, ED (MC, WL, AP ONLY): I-stat hCG, quantitative: <5 m[IU]/mL (ref <5)

## 2019-09-16 LAB — CBC
HCT: 24.9 % — ABNORMAL LOW (ref 36.0–46.0)
Hemoglobin: 6.9 g/dL — CL (ref 12.0–15.0)
MCH: 22 pg — ABNORMAL LOW (ref 26.0–34.0)
MCHC: 27.7 g/dL — ABNORMAL LOW (ref 30.0–36.0)
MCV: 79.3 fL — ABNORMAL LOW (ref 80.0–100.0)
Platelets: 326 10*3/uL (ref 150–400)
RBC: 3.14 MIL/uL — ABNORMAL LOW (ref 3.87–5.11)
RDW: 18.4 % — ABNORMAL HIGH (ref 11.5–15.5)
WBC: 16.3 10*3/uL — ABNORMAL HIGH (ref 4.0–10.5)
nRBC: 0.1 % (ref 0.0–0.2)

## 2019-09-16 LAB — LACTIC ACID, PLASMA
Lactic Acid, Venous: 5.3 mmol/L (ref 0.5–1.9)
Lactic Acid, Venous: 1.3 mmol/L (ref 0.5–1.9)

## 2019-09-16 LAB — URINALYSIS, ROUTINE W REFLEX MICROSCOPIC
Bilirubin Urine: NEGATIVE
Glucose, UA: NEGATIVE mg/dL
Hgb urine dipstick: NEGATIVE
Ketones, ur: 20 mg/dL — AB
Leukocytes,Ua: NEGATIVE
Nitrite: NEGATIVE
Protein, ur: NEGATIVE mg/dL
Specific Gravity, Urine: 1.017 (ref 1.005–1.030)
pH: 5 (ref 5.0–8.0)

## 2019-09-16 LAB — CBC WITH DIFFERENTIAL/PLATELET
Abs Immature Granulocytes: 0.1 10*3/uL — ABNORMAL HIGH (ref 0.00–0.07)
Basophils Absolute: 0.1 10*3/uL (ref 0.0–0.1)
Basophils Relative: 1 %
Eosinophils Absolute: 0 10*3/uL (ref 0.0–0.5)
Eosinophils Relative: 0 %
HCT: 31.8 % — ABNORMAL LOW (ref 36.0–46.0)
Hemoglobin: 9 g/dL — ABNORMAL LOW (ref 12.0–15.0)
Immature Granulocytes: 1 %
Lymphocytes Relative: 14 %
Lymphs Abs: 2.8 10*3/uL (ref 0.7–4.0)
MCH: 22.2 pg — ABNORMAL LOW (ref 26.0–34.0)
MCHC: 28.3 g/dL — ABNORMAL LOW (ref 30.0–36.0)
MCV: 78.3 fL — ABNORMAL LOW (ref 80.0–100.0)
Monocytes Absolute: 1.4 10*3/uL — ABNORMAL HIGH (ref 0.1–1.0)
Monocytes Relative: 7 %
Neutro Abs: 15.5 10*3/uL — ABNORMAL HIGH (ref 1.7–7.7)
Neutrophils Relative %: 77 %
Platelets: 462 10*3/uL — ABNORMAL HIGH (ref 150–400)
RBC: 4.06 MIL/uL (ref 3.87–5.11)
RDW: 18.6 % — ABNORMAL HIGH (ref 11.5–15.5)
WBC: 19.9 10*3/uL — ABNORMAL HIGH (ref 4.0–10.5)
nRBC: 0.1 % (ref 0.0–0.2)

## 2019-09-16 LAB — CBG MONITORING, ED: Glucose-Capillary: 119 mg/dL — ABNORMAL HIGH (ref 70–99)

## 2019-09-16 LAB — RAPID URINE DRUG SCREEN, HOSP PERFORMED
Amphetamines: NOT DETECTED
Barbiturates: NOT DETECTED
Benzodiazepines: NOT DETECTED
Cocaine: NOT DETECTED
Opiates: NOT DETECTED
Tetrahydrocannabinol: NOT DETECTED

## 2019-09-16 LAB — MAGNESIUM: Magnesium: 1.7 mg/dL (ref 1.7–2.4)

## 2019-09-16 LAB — RESPIRATORY PANEL BY RT PCR (FLU A&B, COVID)
Influenza A by PCR: NEGATIVE
Influenza B by PCR: NEGATIVE
SARS Coronavirus 2 by RT PCR: NEGATIVE

## 2019-09-16 LAB — AMMONIA: Ammonia: 19 umol/L (ref 9–35)

## 2019-09-16 LAB — PREPARE RBC (CROSSMATCH)

## 2019-09-16 LAB — CK: Total CK: 204 U/L (ref 38–234)

## 2019-09-16 LAB — PROTIME-INR
INR: 1.2 (ref 0.8–1.2)
Prothrombin Time: 15.1 seconds (ref 11.4–15.2)

## 2019-09-16 LAB — HEMOGLOBIN AND HEMATOCRIT, BLOOD
HCT: 27.1 % — ABNORMAL LOW (ref 36.0–46.0)
Hemoglobin: 8.1 g/dL — ABNORMAL LOW (ref 12.0–15.0)

## 2019-09-16 LAB — PHOSPHORUS: Phosphorus: 3.2 mg/dL (ref 2.5–4.6)

## 2019-09-16 LAB — ACETAMINOPHEN LEVEL: Acetaminophen (Tylenol), Serum: <10 ug/mL — ABNORMAL LOW (ref 10–30)

## 2019-09-16 LAB — HIV ANTIBODY (ROUTINE TESTING W REFLEX): HIV Screen 4th Generation wRfx: NONREACTIVE

## 2019-09-16 LAB — ETHANOL: Alcohol, Ethyl (B): <10 mg/dL (ref <10)

## 2019-09-16 LAB — CDS SEROLOGY

## 2019-09-16 LAB — ABO/RH: ABO/RH(D): A NEG

## 2019-09-16 MED ORDER — ACETAMINOPHEN 325 MG PO TABS
650.0000 mg | ORAL_TABLET | Freq: Four times a day (QID) | ORAL | Status: DC | PRN
  Administered 2019-09-16: 650 mg via ORAL
  Filled 2019-09-16: qty 2

## 2019-09-16 MED ORDER — LACTATED RINGERS IV BOLUS
1000.0000 mL | Freq: Once | INTRAVENOUS | Status: AC
  Administered 2019-09-16: 1000 mL via INTRAVENOUS

## 2019-09-16 MED ORDER — SODIUM CHLORIDE 0.9% IV SOLUTION: Freq: Once | INTRAVENOUS | Status: AC

## 2019-09-16 MED ORDER — ENOXAPARIN SODIUM 40 MG/0.4ML ~~LOC~~ SOLN
40.0000 mg | SUBCUTANEOUS | Status: DC
  Administered 2019-09-16 – 2019-09-20 (×5): 40 mg via SUBCUTANEOUS
  Filled 2019-09-16 (×5): qty 0.4

## 2019-09-16 MED ORDER — CHLORHEXIDINE GLUCONATE CLOTH 2 % EX PADS
6.0000 | MEDICATED_PAD | Freq: Every day | CUTANEOUS | Status: DC
  Administered 2019-09-19: 6 via TOPICAL

## 2019-09-16 MED ORDER — SODIUM CHLORIDE 0.9 % IV SOLN: INTRAVENOUS | Status: DC

## 2019-09-16 MED ORDER — SODIUM CHLORIDE 0.9 % IV BOLUS
1000.0000 mL | Freq: Once | INTRAVENOUS | Status: AC
  Administered 2019-09-16: 1000 mL via INTRAVENOUS

## 2019-09-16 MED ORDER — FOLIC ACID 1 MG PO TABS
1.0000 mg | ORAL_TABLET | Freq: Every day | ORAL | Status: DC
  Administered 2019-09-17 – 2019-09-20 (×4): 1 mg via ORAL
  Filled 2019-09-16 (×4): qty 1

## 2019-09-16 MED ORDER — LORAZEPAM 2 MG/ML IJ SOLN
1.0000 mg | INTRAMUSCULAR | Status: AC | PRN
  Administered 2019-09-16: 2 mg via INTRAVENOUS
  Administered 2019-09-16: 1 mg via INTRAVENOUS
  Administered 2019-09-16: 2 mg via INTRAVENOUS
  Administered 2019-09-16: 1 mg via INTRAVENOUS
  Administered 2019-09-16: 2 mg via INTRAVENOUS
  Administered 2019-09-16: 1 mg via INTRAVENOUS
  Administered 2019-09-16: 2 mg via INTRAVENOUS
  Administered 2019-09-17: 3 mg via INTRAVENOUS
  Administered 2019-09-17 (×2): 2 mg via INTRAVENOUS
  Administered 2019-09-17: 3 mg via INTRAVENOUS
  Administered 2019-09-17: 2 mg via INTRAVENOUS
  Administered 2019-09-17: 4 mg via INTRAVENOUS
  Administered 2019-09-19: 2 mg via INTRAVENOUS
  Filled 2019-09-16 (×2): qty 1
  Filled 2019-09-16: qty 2
  Filled 2019-09-16 (×3): qty 1
  Filled 2019-09-16: qty 2
  Filled 2019-09-16 (×3): qty 1
  Filled 2019-09-16: qty 2
  Filled 2019-09-16 (×3): qty 1

## 2019-09-16 MED ORDER — THIAMINE HCL 100 MG/ML IJ SOLN
100.0000 mg | Freq: Every day | INTRAMUSCULAR | Status: DC
  Administered 2019-09-16 – 2019-09-18 (×2): 100 mg via INTRAVENOUS
  Filled 2019-09-16 (×3): qty 2

## 2019-09-16 MED ORDER — LORAZEPAM 1 MG PO TABS
1.0000 mg | ORAL_TABLET | ORAL | Status: AC | PRN
  Administered 2019-09-17 (×5): 3 mg via ORAL
  Administered 2019-09-18: 2 mg via ORAL
  Filled 2019-09-16 (×3): qty 3
  Filled 2019-09-16: qty 2
  Filled 2019-09-16: qty 3
  Filled 2019-09-16: qty 1
  Filled 2019-09-16: qty 3

## 2019-09-16 MED ORDER — ADULT MULTIVITAMIN W/MINERALS CH
1.0000 | ORAL_TABLET | Freq: Every day | ORAL | Status: DC
  Administered 2019-09-17 – 2019-09-20 (×4): 1 via ORAL
  Filled 2019-09-16 (×4): qty 1

## 2019-09-16 MED ORDER — ACETAMINOPHEN 650 MG RE SUPP: 650.0000 mg | Freq: Four times a day (QID) | RECTAL | Status: DC | PRN

## 2019-09-16 MED ORDER — THIAMINE HCL 100 MG PO TABS
100.0000 mg | ORAL_TABLET | Freq: Every day | ORAL | Status: DC
  Administered 2019-09-17 – 2019-09-20 (×3): 100 mg via ORAL
  Filled 2019-09-16 (×4): qty 1

## 2019-09-16 NOTE — ED Notes (Signed)
Pt refusing catheter at this time.

## 2019-09-16 NOTE — ED Notes (Signed)
CT notified that pt ready

## 2019-09-16 NOTE — ED Provider Notes (Signed)
[facility] EMERGENCY DEPARTMENT Provider Note   CSN: [id number] Arrival date & time: 09/16/19  0631   History Chief Complaint  Patient presents with   Unresponsive    Level 2 Trauma    Amy Villa is a 54 y.o. female.  The history is provided by the EMS personnel. The history is limited by the condition of the patient (Altered mental status).  She has history of hypertension, polysubstance abuse, alcoholic cirrhosis and was brought in by EMS after being found on the floor unresponsive.  She had a scalp laceration with a large pool of blood around her.  Family states that she may have drank some isopropyl alcohol.  She does have history of overdose and family does not know anything about her medications.  No past medical history on file.  There are no problems to display for this patient.   ** The histories are not reviewed yet. Please review them in the "History" navigator section and refresh this Roxie.   OB History   No obstetric history on file.     No family history on file.  Social History   Tobacco Use   Smoking status: Not on file  Substance Use Topics   Alcohol use: Not on file   Drug use: Not on file    Home Medications Prior to Admission medications   Not on File    Allergies    Patient has no allergy information on record.  Review of Systems   Review of Systems  Unable to perform ROS: Mental status change    Physical Exam Updated Vital Signs BP 102/64    Pulse (!) 115    Temp (!) 95.3 F (35.2 C) Comment: Rectal   Resp 17    Ht 5\' 5"  (1.651 m)    Wt 68 kg    SpO2 98%    BMI 24.96 kg/m   Physical Exam Vitals and nursing note reviewed.   55 year old female, resting comfortably and in no acute distress. Vital signs are significant for rapid heart rate. Oxygen saturation is 98%, which is normal. Head is normocephalic.  Large occipital scalp laceration present. PERRLA, EOMI. Oropharynx is clear. Neck is  immobilized in a stiff cervical collar and is nontender.  There is no adenopathy or JVD. Back is nontender and there is no CVA tenderness. Lungs are clear without rales, wheezes, or rhonchi. Chest is nontender. Heart has regular rate and rhythm without murmur. Abdomen is soft, flat, nontender without masses or hepatosplenomegaly and peristalsis is normoactive. Pelvis is stable and nontender. Extremities have no cyanosis or edema, full range of motion is present. Skin is warm and dry without rash. Neurologic: She is awake and responds to painful stimuli and does speak intelligible words but does not answer questions or follow commands or speak in intelligible sentences, cranial nerves are intact, there are no motor or sensory deficits.  ED Results / Procedures / Treatments   Labs (all labs ordered are listed, but only abnormal results are displayed) Labs Reviewed  CDS SEROLOGY  COMPREHENSIVE METABOLIC PANEL  ETHANOL  PROTIME-INR  AMMONIA  VOLATILES,BLD-ACETONE,ETHANOL,ISOPROP,METHANOL  CBC WITH DIFFERENTIAL/PLATELET  URINALYSIS, ROUTINE W REFLEX MICROSCOPIC  RAPID URINE DRUG SCREEN, HOSP PERFORMED  LACTIC ACID, PLASMA  LACTIC ACID, PLASMA  SAMPLE TO BLOOD BANK    EKG None  Radiology CT Head Wo Contrast  Result Date: 09/16/2019 CLINICAL DATA:  Found down. Nonresponsive. EXAM: CT HEAD WITHOUT CONTRAST CT CERVICAL SPINE WITHOUT CONTRAST TECHNIQUE: Multidetector CT  imaging of the head and cervical spine was performed following the standard protocol without intravenous contrast. Multiplanar CT image reconstructions of the cervical spine were also generated. COMPARISON:  MRI cervical spine 03/29/2015 FINDINGS: CT HEAD FINDINGS Brain: There is no evidence for acute hemorrhage, hydrocephalus, mass lesion, or abnormal extra-axial fluid collection. No definite CT evidence for acute infarction. Diffuse loss of parenchymal volume is consistent with atrophy. Vascular: No hyperdense vessel or  unexpected calcification. Skull: No evidence for fracture. No worrisome lytic or sclerotic lesion. Sinuses/Orbits: Chronic mucosal thickening noted right maxillary sinus. Visualized portions of the globes and intraorbital fat are unremarkable. Other: None. CT CERVICAL SPINE FINDINGS Alignment: Trace anterolisthesis C4 on 5 compatible with the advanced facet osteoarthritis at this level. Skull base and vertebrae: No acute fracture. No primary bone lesion or focal pathologic process. Soft tissues and spinal canal: No prevertebral fluid or swelling. No visible canal hematoma. Disc levels: Mild loss of disc height C4-5. Marked bilateral facet osteoarthropathy with widening of the right C5-6 facet joint. Upper chest: Emphysema noted in the lung apices. Other: None. IMPRESSION: 1. No acute intracranial abnormality. 2. Degenerative changes in the cervical spine without fracture. 3. Loss of cervical lordosis. This can be related to patient positioning, muscle spasm or soft tissue injury. Electronically Signed   By: Misty Stanley M.D.   On: 09/16/2019 07:22   CT Cervical Spine Wo Contrast  Result Date: 09/16/2019 CLINICAL DATA:  Found down. Nonresponsive. EXAM: CT HEAD WITHOUT CONTRAST CT CERVICAL SPINE WITHOUT CONTRAST TECHNIQUE: Multidetector CT imaging of the head and cervical spine was performed following the standard protocol without intravenous contrast. Multiplanar CT image reconstructions of the cervical spine were also generated. COMPARISON:  MRI cervical spine 03/29/2015 FINDINGS: CT HEAD FINDINGS Brain: There is no evidence for acute hemorrhage, hydrocephalus, mass lesion, or abnormal extra-axial fluid collection. No definite CT evidence for acute infarction. Diffuse loss of parenchymal volume is consistent with atrophy. Vascular: No hyperdense vessel or unexpected calcification. Skull: No evidence for fracture. No worrisome lytic or sclerotic lesion. Sinuses/Orbits: Chronic mucosal thickening noted right  maxillary sinus. Visualized portions of the globes and intraorbital fat are unremarkable. Other: None. CT CERVICAL SPINE FINDINGS Alignment: Trace anterolisthesis C4 on 5 compatible with the advanced facet osteoarthritis at this level. Skull base and vertebrae: No acute fracture. No primary bone lesion or focal pathologic process. Soft tissues and spinal canal: No prevertebral fluid or swelling. No visible canal hematoma. Disc levels: Mild loss of disc height C4-5. Marked bilateral facet osteoarthropathy with widening of the right C5-6 facet joint. Upper chest: Emphysema noted in the lung apices. Other: None. IMPRESSION: 1. No acute intracranial abnormality. 2. Degenerative changes in the cervical spine without fracture. 3. Loss of cervical lordosis. This can be related to patient positioning, muscle spasm or soft tissue injury. Electronically Signed   By: Misty Stanley M.D.   On: 09/16/2019 07:22   DG Chest Portable 1 View  Result Date: 09/16/2019 CLINICAL DATA:  Altered level of consciousness. EXAM: PORTABLE CHEST 1 VIEW COMPARISON:  02/25/2018 FINDINGS: The heart size and mediastinal contours are within normal limits. Both lungs are clear. The visualized skeletal structures are unremarkable. IMPRESSION: No active disease. Electronically Signed   By: Franchot Gallo M.D.   On: 09/16/2019 07:02    Procedures .Marland KitchenLaceration Repair  Date/Time: 09/16/2019 7:53 AM Performed by: Delora Fuel, MD Authorized by: Delora Fuel, MD   Consent:    Consent obtained:  Emergent situation   Consent given by: Implied consent.  Anesthesia (see MAR for exact dosages):    Anesthesia method:  None Laceration details:    Location:  Scalp   Scalp location:  Occipital   Length (cm):  10   Depth (mm):  5 Repair type:    Repair type:  Simple Pre-procedure details:    Preparation:  Patient was prepped and draped in usual sterile fashion and imaging obtained to evaluate for foreign bodies Exploration:    Hemostasis  achieved with:  Direct pressure   Wound exploration: entire depth of wound probed and visualized     Wound extent: no foreign bodies/material noted     Contaminated: no   Treatment:    Area cleansed with:  Saline   Amount of cleaning:  Standard Skin repair:    Repair method:  Staples   Number of staples:  14 Approximation:    Approximation:  Close Post-procedure details:    Dressing:  Open (no dressing)   Patient tolerance of procedure:  Tolerated well, no immediate complications    CRITICAL CARE Performed by: Delora Fuel Total critical care time: 90 minutes Critical care time was exclusive of separately billable procedures and treating other patients. Critical care was necessary to treat or prevent imminent or life-threatening deterioration. Critical care was time spent personally by me on the following activities: development of treatment plan with patient and/or surrogate as well as nursing, discussions with consultants, evaluation of patient's response to treatment, examination of patient, obtaining history from patient or surrogate, ordering and performing treatments and interventions, ordering and review of laboratory studies, ordering and review of radiographic studies, pulse oximetry and re-evaluation of patient's condition.  Medications Ordered in ED Medications  sodium chloride 0.9 % bolus 1,000 mL (0 mLs Intravenous Stopped 09/16/19 0807)  lactated ringers bolus 1,000 mL (1,000 mLs Intravenous New Bag/Given 09/16/19 [id number])    ED Course  I have reviewed the triage vital signs and the nursing notes.  Pertinent labs & imaging results that were available during my care of the patient were reviewed by me and considered in my medical decision making (see chart for details).  MDM Rules/Calculators/A&P Fall with head injury and altered mentation.  Patient with a history of alcohol and polysubstance abuse and possible ingestion of nonethanol alcohol.  She is noted to be hypothermic  and is started on external warming.  She is being sent for CT of head and cervical spine and labs are obtained including ethanol and nonethanol alcohols.  Old records are reviewed (different mrn - [id number]).  Chest x-ray is unremarkable.  CT scans show no acute injury.  C-collar was removed and scalp laceration was closed.  Hemoglobin has come back at 9.0.  On looking at her other chart, hemoglobin has been in this range previously.  Lactic acid is elevated at 5.8.  There is not felt to be from sepsis but probably from combination of blood loss and hypothermia.  She is given IV fluids.  At this time, patient is more coherent.  She is oriented to person and she is crying for her husband.   Labs are significant for elevated creatinine but with normal anion gap.  Unlikely unusual alcohol poisoning with normal anion gap.  Reviewing her other chart, creatinine had been normal in August 2019.  Ethanol level is still pending, and drug screen is still pending.  She will need to be admitted.  Case is discussed with Dr. Onnie Graham of internal medicine teaching service who agrees to admit the patient.  Final Clinical Impression(s) /  ED Diagnoses Final diagnoses:  Fall at home, initial encounter  Scalp laceration, initial encounter  Hypothermia, initial encounter  Altered mental status, unspecified altered mental status type  Microcytic anemia  Elevated lactic acid level  Renal insufficiency    Rx / DC Orders ED Discharge Orders    None       Delora Fuel, MD [phone]

## 2019-09-16 NOTE — ED Triage Notes (Signed)
Pt was found in home by EMS on floor, lying in a pool of blood, intermittently responsding. Per family, pt had drank a bottle of isopropyl alcohol. Requested that pt go to [facility], but due to pt's unresponsiveness EMS determined that pt needed to go to closest appropriate facility.

## 2019-09-16 NOTE — ED Notes (Signed)
Report regarding this pts status given to Poison Control; no further orders besides repeat H&H and another CBG

## 2019-09-16 NOTE — H&P (Signed)
Date: 09/16/2019               Patient Name:  Amy Villa MRN: [id number]  DOB: 1966/02/16 Age / Sex: 54 y.o., female   PCP: Patient, No Pcp Per         Medical Service: Internal Medicine Teaching Service         Attending Physician: Dr. Aldine Contes, MD    First Contact: Marva Panda, MD, [place] Pager: [place] [phone])  Second Contact: Sherry Ruffing, MD, [place] Pager: [place] [phone])       After Hours (After 5p/  First Contact Pager: [phone]  weekends / holidays): Second Contact Pager: [phone]   Chief Complaint: altered mental status   History of Present Illness: 54 y.o. yo female w/ PMH significant for polysubstance abuse, anxiety/depression, alcoholic cirrhosis w/esophageal varices, COPD, hypertension presenting with altered mental status after being found unresponsive on her kitchen floor in a pool of blood from a scalp laceration. History obtained from chart review and husband via telephone. Patient's husband notes that she has been struggling with depression/anxiety and alcohol and substance abuse for over 15 years; however, has noted that she has become more unstable over the past several years, noting that she has had about 5 admissions over the past few years due to psychiatric issues that are exacerbated by a troublesome relationship with her son.  He notes that he has stopped bringing alcohol into the house. However, patient was stumbling in and out of the house yesterday evening. He notes that she has been drinking mouthwash and also drank rubbing alcohol. He notes that he woke up around 5:15AM and noted the light in the kitchen was on and noted the patient to be lying in a pool of blood. He notes that the patient was cold, ashen white and appeared dehydrated.   Patient noted to be hypothermic on presentation and initiated on external warming. CT head and cervical spine negative. CXR unremarkable. Lactic acid 5.8 for which given 2L IV fluids. Labs significant for elevated  serum creatinine but with normal anion gap. UDS pending. Patient admitted for further evaluation and management.   Meds:  Alendronate 70mg  q7d Gabapentin 100 mg tid  Fluoxetine 40mg  daily  Buspar 15mg  bid Propanolol 20mg  daily  Flexeril 10mg  tid prn  Symbicort 2 puff bid Albuterol 1 puff q6h prn  Allergies: Allergies as of 09/16/2019  . (Not on File)   PMHx:  Polysubstance abuse Anxiety Depression  Alcoholic cirrhosis with esophageal varices COPD Hypertension   Family History:  Unable to attain at this time given patient's altered mental status.   Social History:  Social History   Tobacco Use  . Smoking status: Not on file  Substance Use Topics  . Alcohol use: Not on file  . Drug use: Not on file   Patient with history of polysubstance abuse and alcohol abuse. She lives at home with her husband. Ongoing substance abuse.   Review of Systems: Unable to obtain review of systems due to altered mental status.   Physical Exam: Blood pressure 114/65, pulse (!) 126, temperature 98.5 F (36.9 C), temperature source Oral, resp. rate 20, height 5\' 5"  (1.651 m), weight 68 kg, SpO2 96 %. Physical Exam Constitutional:      General: She is in acute distress.     Appearance: She is not ill-appearing, toxic-appearing or diaphoretic.  HENT:     Head: Normocephalic. Laceration present. Hair is abnormal.     Comments: 10cm occipital laceration w/staples in place  Hair matted down with dry blood  Dry blood noted on left side of face     Mouth/Throat:     Mouth: Mucous membranes are moist.     Pharynx: Oropharynx is clear.  Eyes:     General: No scleral icterus.    Extraocular Movements: Extraocular movements intact.     Conjunctiva/sclera: Conjunctivae normal.     Pupils: Pupils are equal, round, and reactive to light.  Cardiovascular:     Rate and Rhythm: Regular rhythm. Tachycardia present.     Pulses: Normal pulses.     Heart sounds: Normal heart sounds. No murmur. No  gallop.   Pulmonary:     Effort: Pulmonary effort is normal. No respiratory distress.     Breath sounds: Normal breath sounds. No wheezing, rhonchi or rales.  Abdominal:     General: Bowel sounds are normal. There is no distension.     Palpations: Abdomen is soft.     Tenderness: There is no abdominal tenderness. There is no guarding.  Musculoskeletal:        General: No swelling, tenderness, deformity or signs of injury. Normal range of motion.     Cervical back: Normal range of motion and neck supple.  Skin:    General: Skin is warm and dry.     Capillary Refill: Capillary refill takes less than 2 seconds.     Findings: Bruising present.     Comments: Bruising noted on bilateral lower extremities   Neurological:     Mental Status: She is alert. She is disoriented.     Comments: Patient oriented to self and location; unable to assess focal deficits as patient is uncooperative with exam at this time; however is able to spontaneously move all extremities at this time    CBC    Component Value Date/Time   WBC 16.3 (H) 09/16/2019 1033   RBC 3.14 (L) 09/16/2019 1033   HGB 8.1 (L) 09/16/2019 1741   HCT 27.1 (L) 09/16/2019 1741   PLT 326 09/16/2019 1033   MCV 79.3 (L) 09/16/2019 1033   MCH 22.0 (L) 09/16/2019 1033   MCHC 27.7 (L) 09/16/2019 1033   RDW 18.4 (H) 09/16/2019 1033   LYMPHSABS 2.8 09/16/2019 0644   MONOABS 1.4 (H) 09/16/2019 0644   EOSABS 0.0 09/16/2019 0644   BASOSABS 0.1 09/16/2019 0644   CMP     Component Value Date/Time   NA 141 09/16/2019 1033   K 3.7 09/16/2019 1033   CL 111 09/16/2019 1033   CO2 19 (L) 09/16/2019 1033   GLUCOSE 139 (H) 09/16/2019 1033   BUN 14 09/16/2019 1033   CREATININE 1.79 (H) 09/16/2019 1033   CALCIUM 8.1 (L) 09/16/2019 1033   PROT 6.1 (L) 09/16/2019 1033   ALBUMIN 3.6 09/16/2019 1033   AST 24 09/16/2019 1033   ALT 18 09/16/2019 1033   ALKPHOS 70 09/16/2019 1033   BILITOT 0.6 09/16/2019 1033   GFRNONAA 32 (L) 09/16/2019 1033    GFRAA 37 (L) 09/16/2019 1033    EKG: personally reviewed my interpretation is sinus tachycardia; no ST segment changes noted.   CXR: personally reviewed my interpretation is no obvious opacities noted in bilateral lungs.  CT HEAD AND CERVICAL SPINE WO CONTRAST: IMPRESSION: 1. No acute intracranial abnormality. 2. Degenerative changes in the cervical spine without fracture. 3. Loss of cervical lordosis. This can be related to patient positioning, muscle spasm or soft tissue injury.  CT ABDOMEN PELVIS WO CONTRAST:  IMPRESSION: 1. No evidence of bowel obstruction.  2. The bladder is full, bordering on distended. This could be a source for fullness of the lower abdomen. 3. Circumferential thickening of the distal esophagus is new compared to prior imaging from August of 2019. Differential considerations include esophageal neoplasm versus infectious/inflammatory esophagitis. Consider, non emergent consultation with gastroenterology for upper endoscopy. 4.  Aortic Atherosclerosis (ICD10-170.0).  Assessment & Plan by Problem: Ms. Jasmeet Gouch is a 54yr old female with history of polysubstance abuse, anxiety/depression, alcoholic cirrhosis w/esophageal varices, COPD and hypertension presenting with altered mental status in setting of ethanol and isopropyl alcohol ingestion.   Altered mental status:  Ethanol and isopropyl alcohol ingestion:  Patient presented with altered mental status. She was found in pool of blood with occipital scalp laceration. Per family, patient has been ingesting mouth wash and rubbing alcohol. Initially noted to be hypothermic with temp of 95.3 which improved with bair hugger. She is tachycardic. On labs, has non anion gap acidosis. CK and lactic acid wnl. ETOH <10. Patient is alert and oriented to self and location but otherwise is disoriented. She has 10cm occipital laceration with staples in place. Patient is spontaneously moving extremities. Discussed with  poison control and recommended for symptomatic management at this time.  - CIWA w/Ativan - Acetaminophen level - Salicylate level - NS [id number]  - Folic acid 1mg  daily - Thiamine 100mg  daily - Multivitamin 1 tablet daily  - Seizure precautions   Acute blood loss anemia:  Patient found in pool of blood secondary to scalp laceration s/p repair. Noted to have acute drop in Hb 10.9> 6.9 and noted to be tachycardic. CT Abdomen Pelvis negative for acute intraabdominal bleed. Patient transfused 1U pRBC with appropriate response.  - CBC daily   Depression/anxiety:  On fluoxetine 40mg  daily and Buspar 15mg  twice daily at home. - Holding in setting of altered mental status  Hx of alcoholic cirrhosis with esophageal varices: Patient on propanolol 20mg  daily  - Holding due to NPO status.   FEN/GI:  Diet: NPO Fluids: NS 150 cc/hr Electrolytes: Monitor and replete prn  VTE Prophylaxis: Lovenox 40mg  daily  Code status: FULL   Dispo: Admit patient to Inpatient with expected length of stay greater than 2 midnights.  Signed: Harvie Heck, MD  Internal Medicine, PGY-1 Pager: [phone] 09/16/2019, 7:02 PM

## 2019-09-16 NOTE — ED Notes (Signed)
Pt has generalized bruising, bialteral legs and arms.

## 2019-09-16 NOTE — ED Notes (Signed)
Verbal consent over the phone given by Husband Festus Aloe for blood transfusion consent. Witnessed by [employer]

## 2019-09-16 NOTE — ED Notes (Signed)
c collar removed by EDP 

## 2019-09-16 NOTE — ED Notes (Signed)
Bair Hugger applied

## 2019-09-16 NOTE — ED Notes (Signed)
This RN made the provider aware of this pts new blood results along with current tachycardia.

## 2019-09-16 NOTE — ED Notes (Signed)
Admitting MD at the bedside.  

## 2019-09-16 NOTE — ED Notes (Signed)
33M RN is currently assessing whether or not this pt is appropriate for this floor; the RN will keep me informed.

## 2019-09-17 DIAGNOSIS — G92 Toxic encephalopathy: Secondary | ICD-10-CM

## 2019-09-17 DIAGNOSIS — Z79899 Other long term (current) drug therapy: Secondary | ICD-10-CM

## 2019-09-17 DIAGNOSIS — F1910 Other psychoactive substance abuse, uncomplicated: Secondary | ICD-10-CM

## 2019-09-17 DIAGNOSIS — I8510 Secondary esophageal varices without bleeding: Secondary | ICD-10-CM

## 2019-09-17 DIAGNOSIS — T512X1A Toxic effect of 2-Propanol, accidental (unintentional), initial encounter: Secondary | ICD-10-CM

## 2019-09-17 DIAGNOSIS — K766 Portal hypertension: Secondary | ICD-10-CM

## 2019-09-17 DIAGNOSIS — F419 Anxiety disorder, unspecified: Secondary | ICD-10-CM

## 2019-09-17 DIAGNOSIS — S0101XA Laceration without foreign body of scalp, initial encounter: Secondary | ICD-10-CM

## 2019-09-17 DIAGNOSIS — W19XXXA Unspecified fall, initial encounter: Secondary | ICD-10-CM

## 2019-09-17 DIAGNOSIS — K7030 Alcoholic cirrhosis of liver without ascites: Secondary | ICD-10-CM

## 2019-09-17 DIAGNOSIS — I10 Essential (primary) hypertension: Secondary | ICD-10-CM

## 2019-09-17 DIAGNOSIS — D62 Acute posthemorrhagic anemia: Secondary | ICD-10-CM

## 2019-09-17 DIAGNOSIS — K3189 Other diseases of stomach and duodenum: Secondary | ICD-10-CM

## 2019-09-17 DIAGNOSIS — J449 Chronic obstructive pulmonary disease, unspecified: Secondary | ICD-10-CM

## 2019-09-17 DIAGNOSIS — F329 Major depressive disorder, single episode, unspecified: Secondary | ICD-10-CM

## 2019-09-17 DIAGNOSIS — T510X1A Toxic effect of ethanol, accidental (unintentional), initial encounter: Secondary | ICD-10-CM

## 2019-09-17 DIAGNOSIS — F1010 Alcohol abuse, uncomplicated: Secondary | ICD-10-CM

## 2019-09-17 DIAGNOSIS — Z9889 Other specified postprocedural states: Secondary | ICD-10-CM

## 2019-09-17 LAB — COMPREHENSIVE METABOLIC PANEL
ALT: 18 U/L (ref 0–44)
AST: 25 U/L (ref 15–41)
Albumin: 3.5 g/dL (ref 3.5–5.0)
Alkaline Phosphatase: 63 U/L (ref 38–126)
Anion gap: 10 (ref 5–15)
BUN: 10 mg/dL (ref 6–20)
CO2: 21 mmol/L — ABNORMAL LOW (ref 22–32)
Calcium: 8.4 mg/dL — ABNORMAL LOW (ref 8.9–10.3)
Chloride: 113 mmol/L — ABNORMAL HIGH (ref 98–111)
Creatinine, Ser: 1.27 mg/dL — ABNORMAL HIGH (ref 0.44–1.00)
GFR calc Af Amer: 56 mL/min — ABNORMAL LOW (ref >60)
GFR calc non Af Amer: 48 mL/min — ABNORMAL LOW (ref >60)
Glucose, Bld: 120 mg/dL — ABNORMAL HIGH (ref 70–99)
Potassium: 3.3 mmol/L — ABNORMAL LOW (ref 3.5–5.1)
Sodium: 144 mmol/L (ref 135–145)
Total Bilirubin: 0.7 mg/dL (ref 0.3–1.2)
Total Protein: 6 g/dL — ABNORMAL LOW (ref 6.5–8.1)

## 2019-09-17 LAB — TYPE AND SCREEN
ABO/RH(D): A NEG
Antibody Screen: NEGATIVE
Unit division: 0

## 2019-09-17 LAB — CBC
HCT: 25.9 % — ABNORMAL LOW (ref 36.0–46.0)
Hemoglobin: 7.7 g/dL — ABNORMAL LOW (ref 12.0–15.0)
MCH: 23.1 pg — ABNORMAL LOW (ref 26.0–34.0)
MCHC: 29.7 g/dL — ABNORMAL LOW (ref 30.0–36.0)
MCV: 77.8 fL — ABNORMAL LOW (ref 80.0–100.0)
Platelets: 218 10*3/uL (ref 150–400)
RBC: 3.33 MIL/uL — ABNORMAL LOW (ref 3.87–5.11)
RDW: 17.8 % — ABNORMAL HIGH (ref 11.5–15.5)
WBC: 9.5 10*3/uL (ref 4.0–10.5)
nRBC: 0 % (ref 0.0–0.2)

## 2019-09-17 LAB — BPAM RBC
Blood Product Expiration Date: 202103032359
ISSUE DATE / TIME: 202102271301
Unit Type and Rh: 600

## 2019-09-17 LAB — CK: Total CK: 326 U/L — ABNORMAL HIGH (ref 38–234)

## 2019-09-17 MED ORDER — ACETAMINOPHEN 325 MG PO TABS
325.0000 mg | ORAL_TABLET | ORAL | Status: DC | PRN
  Administered 2019-09-17 – 2019-09-18 (×5): 325 mg via ORAL
  Filled 2019-09-17 (×5): qty 1

## 2019-09-17 MED ORDER — PANTOPRAZOLE SODIUM 40 MG PO TBEC
40.0000 mg | DELAYED_RELEASE_TABLET | Freq: Every day | ORAL | Status: DC
  Administered 2019-09-17 – 2019-09-20 (×4): 40 mg via ORAL
  Filled 2019-09-17 (×4): qty 1

## 2019-09-17 MED ORDER — FLUTICASONE FUROATE-VILANTEROL 200-25 MCG/INH IN AEPB
1.0000 | INHALATION_SPRAY | Freq: Every day | RESPIRATORY_TRACT | Status: DC
  Administered 2019-09-17 – 2019-09-20 (×4): 1 via RESPIRATORY_TRACT

## 2019-09-17 MED ORDER — ACETAMINOPHEN 650 MG RE SUPP: 650.0000 mg | Freq: Four times a day (QID) | RECTAL | Status: DC | PRN

## 2019-09-17 MED ORDER — BUSPIRONE HCL 5 MG PO TABS
15.0000 mg | ORAL_TABLET | Freq: Two times a day (BID) | ORAL | Status: DC
  Administered 2019-09-17 – 2019-09-20 (×7): 15 mg via ORAL
  Filled 2019-09-17 (×7): qty 3

## 2019-09-17 MED ORDER — LACTATED RINGERS IV SOLN: INTRAVENOUS | Status: AC

## 2019-09-17 MED ORDER — PROPRANOLOL HCL 20 MG PO TABS
20.0000 mg | ORAL_TABLET | Freq: Every day | ORAL | Status: DC
  Administered 2019-09-17 – 2019-09-20 (×4): 20 mg via ORAL
  Filled 2019-09-17 (×4): qty 1

## 2019-09-17 MED ORDER — FLUOXETINE HCL 20 MG PO CAPS
40.0000 mg | ORAL_CAPSULE | Freq: Every day | ORAL | Status: DC
  Administered 2019-09-17 – 2019-09-20 (×4): 40 mg via ORAL
  Filled 2019-09-17 (×4): qty 2

## 2019-09-17 MED ORDER — FLUTICASONE FUROATE-VILANTEROL 200-25 MCG/INH IN AEPB: 1.0000 | INHALATION_SPRAY | Freq: Every day | RESPIRATORY_TRACT | Status: DC

## 2019-09-17 MED ORDER — PROPRANOLOL HCL 20 MG PO TABS: 20.0000 mg | ORAL_TABLET | Freq: Two times a day (BID) | ORAL | Status: DC

## 2019-09-17 MED ORDER — FLUTICASONE FUROATE-VILANTEROL 200-25 MCG/INH IN AEPB
1.0000 | INHALATION_SPRAY | Freq: Every day | RESPIRATORY_TRACT | Status: DC
  Filled 2019-09-17: qty 28

## 2019-09-17 MED ORDER — IPRATROPIUM-ALBUTEROL 0.5-2.5 (3) MG/3ML IN SOLN
3.0000 mL | Freq: Four times a day (QID) | RESPIRATORY_TRACT | Status: DC | PRN
  Administered 2019-09-17: 3 mL via RESPIRATORY_TRACT
  Filled 2019-09-17: qty 3

## 2019-09-17 MED ORDER — IPRATROPIUM-ALBUTEROL 0.5-2.5 (3) MG/3ML IN SOLN
3.0000 mL | Freq: Four times a day (QID) | RESPIRATORY_TRACT | Status: DC
  Administered 2019-09-17 (×2): 3 mL via RESPIRATORY_TRACT
  Filled 2019-09-17 (×2): qty 3

## 2019-09-17 NOTE — Progress Notes (Signed)
Admitted from ED via stretcher. Placed on low bed with padded siderails, in low position. Telesitter to be placed with pt. IV started. Telemetry placed. See assessment as charted.

## 2019-09-17 NOTE — Progress Notes (Signed)
CIWA score of 17. Received 3mg  of Ativan which does not seem to make a difference. Pt continues to attempt to climb out of bed, biting IV tubing, pulling on foley tubing and pulling telemetry off. Dr. Sheppard Coil texted

## 2019-09-17 NOTE — Progress Notes (Addendum)
Patient seen at the bedside after being paged about CIWA score 17 and Ativan administration with minimal effect.  Patient reportedly pulling off telemetry leads and IVs. When I entered the room, the patient was being bathed by the nursing nurse tech. The patient is awake and alert and oriented and sobbing.  The patient keeps repeating "my family is trying to get me".  On physical exam, normal S1 and S2, no murmurs rubs or gallops appreciated.  Tachycardia noted.  Breath sounds were clear bilaterally. No tremors noted.  Patient is alert but not oriented.  Follows commands appropriately.  I do not think the patient is progressing to Wernicke's Encephalopathy at this time.  We will continue to monitor.  ADDENDUM: Patient became increasingly agitated again.  She is pulling off her soft mittens, picking at IV lines and her telemetry leads.  She is also flailing in bed. Will start Librium 25 mg every 6 hours.  Plan: -Continue CIWA with Ativan -Place soft mittens on hands -Start Librium 25 mg every 6 hours  Earlene Plater, MD Internal Medicine, PGY1 Pager: [phone]  09/17/2019,9:11 PM

## 2019-09-17 NOTE — Progress Notes (Addendum)
Subjective: HD#1 Overnight, patient noted to have elevated CIWA scores of 13-16 for which she received total 11mg  of ativan.   This morning, patient evaluated at bedside. She is somnolent but arousable to sternal rub. She does not appear to be in acute distress and does not voice any acute concerns at this time. She intermittently follows commands.   Objective:  Vital signs in last 24 hours: Vitals:   09/17/19 0500 09/17/19 0600 09/17/19 0924 09/17/19 1130  BP:   120/72   Pulse: 96 99 99 99  Resp:   18 20  Temp:   (!) 97.4 F (36.3 C)   TempSrc:   Oral   SpO2:   97% 98%  Weight:      Height:       CBC Latest Ref Rng & Units 09/17/2019 09/16/2019 09/16/2019  WBC 4.0 - 10.5 K/uL 9.5 - 16.3(H)  Hemoglobin 12.0 - 15.0 g/dL 7.7(L) 8.1(L) 6.9(LL)  Hematocrit 36.0 - 46.0 % 25.9(L) 27.1(L) 24.9(L)  Platelets 150 - 400 K/uL 218 - 326   CMP Latest Ref Rng & Units 09/17/2019 09/16/2019 09/16/2019  Glucose 70 - 99 mg/dL 120(H) 139(H) 211(H)  BUN 6 - 20 mg/dL 10 14 19   Creatinine 0.44 - 1.00 mg/dL 1.27(H) 1.79(H) 1.00  Sodium 135 - 145 mmol/L 144 141 141  Potassium 3.5 - 5.1 mmol/L 3.3(L) 3.7 4.0  Chloride 98 - 111 mmol/L 113(H) 111 107  CO2 22 - 32 mmol/L 21(L) 19(L) -  Calcium 8.9 - 10.3 mg/dL 8.4(L) 8.1(L) -  Total Protein 6.5 - 8.1 g/dL 6.0(L) 6.1(L) -  Total Bilirubin 0.3 - 1.2 mg/dL 0.7 0.6 -  Alkaline Phos 38 - 126 U/L 63 70 -  AST 15 - 41 U/L 25 24 -  ALT 0 - 44 U/L 18 18 -   Physical Exam  Constitutional: She is well-developed, well-nourished, and in no distress. No distress.  HENT:  Head: Normocephalic.  Mouth/Throat: Oropharynx is clear and moist.  Occipital scalp laceration s/p 14 staples; hair matted with blood  Eyes: Conjunctivae and EOM are normal. Right eye exhibits no discharge. No scleral icterus.  Cardiovascular: Regular rhythm, normal heart sounds and intact distal pulses.  No murmur heard. Pulmonary/Chest: Effort normal and breath sounds normal. No respiratory  distress. She has no wheezes. She has no rales.  Abdominal: Soft. Bowel sounds are normal. She exhibits no distension. There is no abdominal tenderness. There is no rebound.  Musculoskeletal:        General: No tenderness or edema. Normal range of motion.     Cervical back: Normal range of motion and neck supple.  Neurological:  Patient somnolent but arousable; unable to assess complete neurologic exam; however patient is moving all extremities spontaneously  Skin: Skin is warm and dry. She is not diaphoretic.  Bruising on bilateral lower extremities    Assessment/Plan: [name] is a 54 year old female with history of polysubstance abuse, anxiety/depression, alcoholic cirrhosis with esophageal varices and portal hypertensive gastropathy, COPD and hypertension presenting with altered mental status in setting of ethanol and isopropyl alcohol ingestion.   Acute encephalopathy in setting of substance use:  Patient presented with altered mental status after being found on the floor in a pool of blood by husband noted to have ingested mouthwash and rubbing alcohol (ethanol and isopropyl alcohol). ETOH levels, salicylate level, acetaminophen level wnl and UDS negative. Poison control contacted and recommended for supportive care. She was noted to have elevated CIWA score overnight for  which she received multiple doses of Ativan. This morning, mental status is mildly improved and she is intermittently following commands.  - LR 75 cc/hr - CIWA with Ativan - Safety and seizure precautions  - Thiamine 100mg  daily - Folic acid 1mg  daily - Multivitamin daily   Acute blood loss anemia: Patient noted to have acute drop in Hb to 6.9 yesterday in setting of bleed from scalp laceration. She received 1U pRBC with appropriate response to 8.1. Hb this AM 7.7 - CBC daily   Depression/anxiety:  Patient with history of depression/anxiety on Fluoxetine 40mg  daily and Buspirone 15mg  twice daily at  home. Per family, patient has had worsening of her depression. - Fluoxetine 40mg  daily - Buspirone 15mg  twice daily - Consider psych evaluation  Hx of alcoholic cirrhosis with esophageal varices and portal hypertensive gastropathy: Patient with history of alcoholic cirrhosis with esophageal varices per chart review. EGD in January 2020 with small esophageal varices for which she is on propanolol 20mg  daily and protonix 40mg  daily. Patient with tachycardia.  - Resume propanolol 20mg  daily  - Resume protonix 40mg  daily   COPD:  Patient with history of COPD on inhalers at home.  - Duoneb 26ml q6h prn   FEN/GI: Diet: Heart healthy Fluids: LR 75 cc/hr Electrolytes: Monitor and replete prn  VTE Prophylaxis: Lovenox 40mg  daily  Code status: FULL   Prior to Admission Living Arrangement: Home Anticipated Discharge Location: Home vs SNF Barriers to Discharge: Continued medical management  Dispo: Anticipated discharge in approximately 2-3 day(s).   Harvie Heck, MD  Internal Medicine, PGY-1  Pager: [phone] 09/17/2019, 12:36 PM

## 2019-09-17 NOTE — Progress Notes (Addendum)
NAME:  Amy Villa, MRN:  [id number], DOB:  [birth year], LOS: 2 ADMISSION DATE:  09/16/2019   Subjective/Interm history  Became increasingly aggitated and delusional overnight. Believed her family was out to get her. Believed she was at [employer].  Required wrist restraints for safety.  CIWA scores 17 and 25 Librium started  Groggy this morning but answering questions appropriately. Admits to drinking rubbing alcohol prior to admission but does not remember events immediately preceding her fall.  Objective   Blood pressure 139/82, pulse 83, temperature 98.3 F (36.8 C), temperature source Axillary, resp. rate 17, height 5\' 5"  (1.651 m), weight 62.1 kg, SpO2 98 %.     Intake/Output Summary (Last 24 hours) at 09/18/2019 1023 Last data filed at 09/18/2019 0600 Gross per 24 hour  Intake 1837.36 ml  Output 3750 ml  Net -1912.64 ml   Filed Weights   09/16/19 0646 09/16/19 2133  Weight: 68 kg 62.1 kg    Examination: GENERAL: chronically ill appearing HEENT: occipital scalp laceration with staples in place surrounded by dried blood CARDIAC: heart RRR. No peripheral edema.  PULMONARY: acyanotic. Lung sounds clear to auscultation. ABDOMEN: soft. Nontender to palpation.  Nondistended.  NEURO: a/ox3 SKIN: bruises to bilateral lower extremities. PSYCH: sluggish affect  Significant Diagnostic Tests:  2/27 CT head/neck>>no acute findings 2/27 CT abd/pelvis>>circumfrential thickening of the distal esophagus  Labs    CBC Latest Ref Rng & Units 09/18/2019 09/17/2019 09/16/2019  WBC 4.0 - 10.5 K/uL 5.7 9.5 -  Hemoglobin 12.0 - 15.0 g/dL 7.6(L) 7.7(L) 8.1(L)  Hematocrit 36.0 - 46.0 % 25.5(L) 25.9(L) 27.1(L)  Platelets 150 - 400 K/uL 162 218 -   BMP Latest Ref Rng & Units 09/18/2019 09/17/2019 09/16/2019  Glucose 70 - 99 mg/dL 108(H) 120(H) 139(H)  BUN 6 - 20 mg/dL <5(L) 10 14  Creatinine 0.44 - 1.00 mg/dL 0.74 1.27(H) 1.79(H)  Sodium 135 - 145 mmol/L 145 144 141  Potassium 3.5 -  5.1 mmol/L 3.3(L) 3.3(L) 3.7  Chloride 98 - 111 mmol/L 110 113(H) 111  CO2 22 - 32 mmol/L 25 21(L) 19(L)  Calcium 8.9 - 10.3 mg/dL 9.0 8.4(L) 8.1(L)    Summary  Amy Villa is a 54 yo female with a PMH significant for polysubstance abuse, anxiety/depression, alcohol dependence with subsequent liver cirrhosis>esophageal varicies, COPD, and hypertension who presented to [facility] on 2/27 for AMS after being found unresponsive by her husband laying in a pool of blood from a head laceration. Husband reportedly noted that [place] has been drinking mouthwash and rubbing alcohol since he is not allowing alcohol in the house.   Assessment & Plan:  Active Problems:   AMS (altered mental status)  Acute encephalopathy. Alcohol dependence. ETOH withdrawal vs Wernicke's vs hepatic encephalopathy. Mental status appears to be improving so I would be less inclined to think this was hepatic. CIWAs 17-25 overnight with increased aggitation/delusions however seemed resolved this morning. Plan CIWA with ativan. Librium started overnight. Will need to tread carefully with librium in the setting of severe liver disease Seizure precautions 1:1 sitter if needed Thiamine, folate, multivitamin  Acute blood loss anemia superimposed on microcytic anemia. Likely from scalp lac.  Total transfusions 1 hgb stable at 7.6 today. Platelets down 218>>162 today.  Plan Will continue to monitor with lab checks  Depression/anxiety. Continue fluoxetine, busprinone. Will need close outpatient follow up with psych for progressive symptoms prior to admission.  Alcoholic liver cirrhosis with portal hypertension and esophageal varicies. Continue propranolol and protonix  Best practice:  CODE STATUS: Full Diet: cardiac DVT for prophylaxis: lovenox Dispo: pending further eval and management of alcohol withdrawal. Will need outpatient follow up for esophageal thickening as seen on CT a/p.   Mitzi Hansen, MD INTERNAL  MEDICINE RESIDENT PGY-1 PAGER #: [phone] 09/18/19  10:23 AM

## 2019-09-17 NOTE — Progress Notes (Signed)
Dr.Alexander texted to inform CIWA score of 25 and 4 mg of Ativan given. [facility] called and responded. Soft mittens applied but patient able to get off. Wrist restraints applied. Pt thinks she is @ [employer]. Kicking to get out of bed. Throwing self against padded rails.[name]

## 2019-09-17 NOTE — Progress Notes (Signed)
Dr. Madilyn Fireman texted [phone], 2 mg Ativan given. Pt resting at the moment.
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
## 2019-09-17 NOTE — Progress Notes (Signed)
Dr.Lanier texted to inform her "Amy Villa" from [employer] called. Requested repeat CK be drawn and if elevated, continue IV fluids. Patient currently sleeping.

## 2019-09-17 NOTE — Plan of Care (Signed)
Paged regarding CIWA of 16 overnight.   1 Tactile disturbances 5 Anxiety 4 Headache 5 Agitation 1 Orientation = 16  Received 3 mg of ativan.  On chart review, noted HR in the 120s so went to evaluate patient.  Reviewed tele which showed sinus tachycardia.   On exam, the pt appeared less agitated than prior. She was alert and oriented x3, also improved from prior exam. She still endorsed a headache which she had previously localized to a scalp laceration. Wheezing heard in the lungs bilaterally. Patient with history of COPD and uses inhalers at home.   Plan: -EKG: NSR with HR of 100  -duonebs  Al Decant, MD 09/17/2019, 1:52 AM Pager: [phone]

## 2019-09-17 NOTE — Progress Notes (Signed)
Dr.Lanier texted re: increase in pt's CIWA score. To come see pt.

## 2019-09-17 NOTE — Progress Notes (Signed)
Date: 09/17/2019  Patient name: Amy Villa  Medical record number: [id number]  Date of birth: 07-28-1965   I have seen and evaluated Derald Macleod and discussed their care with the Residency Team.  In brief, patient is a 54 year old female with past medical history of polysubstance abuse, anxiety/depression, alcoholic cirrhosis with esophageal varices, COPD, hypertension who presented to the ED with altered mental status after being found unresponsive on the kitchen floor by her husband.  History obtained from chart as patient is unable to provide history at this time.  Per chart, patient has been struggling with depression anxiety as well as alcohol and substance abuse over the last 15 years.  Patient has had approximately 5 admissions over the past few years due to psychiatric issues that are exacerbated by a troublesome relationship with her son.  Patient's husband had stopping alcohol into the house however he noted that the patient has been drinking mouthwash as well as rubbing alcohol.  On the day prior to admission patient was noted to be stumbling in and out of the house and yesterday he woke up at 5:15 in the morning and solid on the kitchen and found the patient lying in a pool of blood.  He called EMS who brought her to the ED for further evaluation.  Patient is drowsy but easily arousable today.  She denies any pain currently but only intermittently answer questions.  PMHx, Fam Hx, and/or Soc Hx : As per resident admit note  Vitals:   09/17/19 0924 09/17/19 1130  BP: 120/72   Pulse: 99 99  Resp: 18 20  Temp: (!) 97.4 F (36.3 C)   SpO2: 97% 98%   General: Sleepy but easily arousable, only intermittently answers questions.  Was able to state her name but did not respond when asked for the date or where she was CVS: Tachycardic, irregular  Lungs: CTA bilaterally Abdomen: Soft, nontender, nondistended, normoactive bowel sounds Extremities: No edema noted, nontender to  palpation HEENT: Patient with occipital laceration with staples in place and matted hair with dried blood Skin: Warm and dry, bruising on bilateral lower extremities Neuro: Sleepy and only intermittently answers questions.  Follows some commands but not all  Assessment and Plan: I have seen and evaluated the patient as outlined above. I agree with the formulated Assessment and Plan as detailed in the residents' note, with the following changes:   1.  Acute encephalopathy secondary to substance use: -Patient presented to the ED after being found on the floor in a pool of blood by her husband in the setting of a history of polysubstance abuse.  Per husband, patient ingested rubbing alcohol as well as mouthwash (ethanol and isopropyl alcohol ingestion).  Her alcohol level was within normal limits and her UDS was negative.  Her mental status is mildly improved today but patient appears sleepy likely secondary to Ativan given for elevated CIWA score. -Resident discussed case with poison control yesterday.  Will maintain patient on [place] with Ativan as needed -Tylenol and salicylate levels were within normal limits -Continue with IV fluids for now.  Patient with only mildly elevated CK level.  We will stop trending at this time. -Continue with thiamine, folic acid and multivitamin -We will maintain seizure precautions for now -Of note, patient did have acute blood loss anemia secondary to trauma (scalp laceration).  Patient's hemoglobin is improved to 7.7 status post 1 unit PRBC.  We will continue to monitor CBC daily -We will hold fluoxetine and BuSpar for  now given patient's altered mental status. -We will continue to monitor closely  Aldine Contes, MD 2/28/202112:30 PM

## 2019-09-18 DIAGNOSIS — G9340 Encephalopathy, unspecified: Secondary | ICD-10-CM

## 2019-09-18 DIAGNOSIS — F10231 Alcohol dependence with withdrawal delirium: Secondary | ICD-10-CM

## 2019-09-18 LAB — CBC
HCT: 25.5 % — ABNORMAL LOW (ref 36.0–46.0)
Hemoglobin: 7.6 g/dL — ABNORMAL LOW (ref 12.0–15.0)
MCH: 23.2 pg — ABNORMAL LOW (ref 26.0–34.0)
MCHC: 29.8 g/dL — ABNORMAL LOW (ref 30.0–36.0)
MCV: 77.7 fL — ABNORMAL LOW (ref 80.0–100.0)
Platelets: 162 10*3/uL (ref 150–400)
RBC: 3.28 MIL/uL — ABNORMAL LOW (ref 3.87–5.11)
RDW: 17.6 % — ABNORMAL HIGH (ref 11.5–15.5)
WBC: 5.7 10*3/uL (ref 4.0–10.5)
nRBC: 0 % (ref 0.0–0.2)

## 2019-09-18 LAB — BASIC METABOLIC PANEL
Anion gap: 10 (ref 5–15)
BUN: <5 mg/dL — ABNORMAL LOW (ref 6–20)
CO2: 25 mmol/L (ref 22–32)
Calcium: 9 mg/dL (ref 8.9–10.3)
Chloride: 110 mmol/L (ref 98–111)
Creatinine, Ser: 0.74 mg/dL (ref 0.44–1.00)
GFR calc Af Amer: >60 mL/min (ref >60)
GFR calc non Af Amer: >60 mL/min (ref >60)
Glucose, Bld: 108 mg/dL — ABNORMAL HIGH (ref 70–99)
Potassium: 3.3 mmol/L — ABNORMAL LOW (ref 3.5–5.1)
Sodium: 145 mmol/L (ref 135–145)

## 2019-09-18 LAB — VOLATILES,BLD-ACETONE,ETHANOL,ISOPROP,METHANOL
Acetone, blood: 0.228 g/dL — ABNORMAL HIGH (ref 0.000–0.010)
Ethanol, blood: <0.01 g/dL (ref 0.000–0.010)
Isopropanol, blood: 0.076 g/dL — ABNORMAL HIGH (ref 0.000–0.010)
Methanol, blood: <0.01 g/dL (ref 0.000–0.010)

## 2019-09-18 MED ORDER — POTASSIUM CHLORIDE CRYS ER 20 MEQ PO TBCR
40.0000 meq | EXTENDED_RELEASE_TABLET | Freq: Once | ORAL | Status: AC
  Administered 2019-09-18: 40 meq via ORAL
  Filled 2019-09-18: qty 2

## 2019-09-18 MED ORDER — CHLORDIAZEPOXIDE HCL 25 MG PO CAPS
25.0000 mg | ORAL_CAPSULE | Freq: Four times a day (QID) | ORAL | Status: DC
  Administered 2019-09-18 (×2): 25 mg via ORAL
  Filled 2019-09-18 (×2): qty 1

## 2019-09-18 MED ORDER — CHLORDIAZEPOXIDE HCL 25 MG PO CAPS
25.0000 mg | ORAL_CAPSULE | Freq: Three times a day (TID) | ORAL | Status: AC
  Administered 2019-09-19 – 2019-09-20 (×4): 25 mg via ORAL
  Filled 2019-09-18 (×4): qty 1

## 2019-09-18 NOTE — Plan of Care (Signed)

## 2019-09-18 NOTE — Progress Notes (Signed)
PT Cancellation Note  Patient Details Name: Amy Villa MRN: [id number] DOB: 1965-12-16   Cancelled Treatment:    Reason Eval/Treat Not Completed: Patient not medically ready;Other (comment).  Has restraints x all 4 extremities with agitation being an issue, on telesitter and has been difficult to manage.  Will reattempt when pt is better able to follow instructions and participate in a PT session.   Ramond Dial 09/18/2019, 9:32 AM  Mee Hives, PT MS Acute Rehab Dept. Number: [place]

## 2019-09-18 NOTE — Progress Notes (Signed)
Received call from [facility]. No new recomendations @ this time. Since Librium given, patient resting quietly.

## 2019-09-18 NOTE — Progress Notes (Addendum)
Occupational Therapy Evaluation Patient Details Name: Amy Villa MRN: [id number] DOB: 01/04/1966 Today's Date: 09/18/2019    History of Present Illness Pt is a 54 y/o female with a PMHx significant for polysubstance abuse, anxiety/depression, alcohol dependence with subsequent liver cirrhosis>esophageal varicies, COPD, and hypertension who presented to [facility] on 2/27 for AMS after being found down, unresponsive by her husband, pt also with head laceration due to fall. CT of the head/neck with no acute findings. CT abd/pelvis with circumferential thickening of the distal esophagus.    Clinical Impression   This 54 y/o female presents with the above. PTA pt reports independence with ADL and functional mobility. Session limited today as pt reports headache and worsened dizziness with long sitting in bed. Pt able to perform bed mobility to transition to long sitting at minguard assist level, completing simple grooming and self feeding ADL with setup/supervision. Will continue to assess mobility beyond bed level in following sessions. Pt with confusion noted during session but able to remain awake/alert and overall appropriate when conversing with therapist, noted intermittently anxious but able to be redirected. BP 127/77 while upright. She will benefit from continued acute OT services, currently recommend SNF level therapies however will continue to assess/update as pt able to participate in OOB activity (hopefully can progress to home). Will follow.     Follow Up Recommendations  SNF;Supervision/Assistance - 24 hour(pending progress with mobility)    Equipment Recommendations  Other (comment)(TBD)           Precautions / Restrictions Precautions Precautions: Fall Precaution Comments: bil wrist/LE restraints Restrictions Weight Bearing Restrictions: No      Mobility Bed Mobility Overal bed mobility: Needs Assistance             General bed mobility comments: pt transitioning  to long sitting with minguard-supervision assist, pt with dizziness prior to achieving long sitting and reports worsens with upright position, reports often feeling anxious and has "panic attacks" with onset of dizziness therefore deferred further mobility   Transfers                 General transfer comment: deferred     Balance Overall balance assessment: Needs assistance   Sitting balance-Leahy Scale: Fair Sitting balance - Comments: pt tolerating long sitting without UE support or external assist                                   ADL either performed or assessed with clinical judgement   ADL Overall ADL's : Needs assistance/impaired Eating/Feeding: Set up;Sitting   Grooming: Wash/dry face;Set up;Sitting Grooming Details (indicate cue type and reason): sitting upright in bed Upper Body Bathing: Sitting;Minimal assistance;Min guard   Lower Body Bathing: Moderate assistance;Sitting/lateral leans;Bed level   Upper Body Dressing : Min guard;Minimal assistance;Sitting   Lower Body Dressing: Maximal assistance;Sitting/lateral leans;Bed level       Toileting- Clothing Manipulation and Hygiene: Total assistance;Bed level         General ADL Comments: limited session as pt with dizziness, worsens with long sitting in bed. Pt with weakness, impaired cognition, decreased activity tolerance     Vision Baseline Vision/History: Wears glasses Wears Glasses: At all times(does not have them in hospital at time of eval) Additional Comments: will continue to assess     Perception     Praxis      Pertinent Vitals/Pain Pain Assessment: Faces Faces Pain Scale: Hurts even more Pain Location:  head/headache Pain Descriptors / Indicators: Headache;Grimacing;Discomfort Pain Intervention(s): Limited activity within patient's tolerance;Monitored during session;Patient requesting pain meds-RN notified;Premedicated before session;Repositioned     Hand Dominance      Extremity/Trunk Assessment Upper Extremity Assessment Upper Extremity Assessment: Generalized weakness(grossly 3/5 throughout)   Lower Extremity Assessment Lower Extremity Assessment: Defer to PT evaluation       Communication Communication Communication: No difficulties   Cognition Arousal/Alertness: Awake/alert Behavior During Therapy: WFL for tasks assessed/performed;Anxious Overall Cognitive Status: Impaired/Different from baseline Area of Impairment: Orientation;Attention;Following commands;Awareness;Problem solving                 Orientation Level: Disoriented to;Time Current Attention Level: Sustained   Following Commands: Follows one step commands consistently;Follows one step commands with increased time   Awareness: Emergent Problem Solving: Slow processing;Decreased initiation     General Comments  BP monitored given dizziness and stable, 127/77     Exercises     Shoulder Instructions      Home Living Family/patient expects to be discharged to:: Private residence Living Arrangements: Spouse/significant other;Children Available Help at Discharge: Family Type of Home: House Home Access: Stairs to enter Technical brewer of Steps: 3 Entrance Stairs-Rails: Right;Left;Can reach both Home Layout: One level     Bathroom Shower/Tub: Occupational psychologist: Standard     Home Equipment: Shower seat - built in;Grab bars - tub/shower;Crutches          Prior Functioning/Environment Level of Independence: Independent        Comments: reports memory issues ongoing for past few months         OT Problem List: Decreased strength;Decreased range of motion;Decreased activity tolerance;Impaired balance (sitting and/or standing);Impaired vision/perception;Decreased cognition;Decreased safety awareness;Impaired UE functional use      OT Treatment/Interventions: Self-care/ADL training;Therapeutic exercise;Energy conservation;DME and/or AE  instruction;Therapeutic activities;Patient/family education;Balance training;Cognitive remediation/compensation;Visual/perceptual remediation/compensation    OT Goals(Current goals can be found in the care plan section) Acute Rehab OT Goals Patient Stated Goal: return home to her two cats and dog OT Goal Formulation: With patient Time For Goal Achievement: 10/02/19 Potential to Achieve Goals: Good  OT Frequency: Min 2X/week   Barriers to D/C:            Co-evaluation              AM-PAC OT "6 Clicks" Daily Activity     Outcome Measure Help from another person eating meals?: None Help from another person taking care of personal grooming?: A Little Help from another person toileting, which includes using toliet, bedpan, or urinal?: Total Help from another person bathing (including washing, rinsing, drying)?: A Lot Help from another person to put on and taking off regular upper body clothing?: A Lot Help from another person to put on and taking off regular lower body clothing?: Total 6 Click Score: 13   End of Session Nurse Communication: Mobility status  Activity Tolerance: Patient tolerated treatment well;Other (comment)(limited due to dizziness ) Patient left: in bed;with call bell/phone within reach;with restraints reapplied  OT Visit Diagnosis: Muscle weakness (generalized) (M62.81);Other symptoms and signs involving cognitive function;Other abnormalities of gait and mobility (R26.89)                Time: 1340-1417 OT Time Calculation (min): 37 min Charges:  OT General Charges $OT Visit: 1 Visit OT Evaluation $OT Eval Moderate Complexity: 1 Mod OT Treatments $Self Care/Home Management : 8-22 mins  Lou Cal, OT Supplemental Rehabilitation Services Pager [phone] Office [phone]   Bubba Hales L  Megan Salon 09/18/2019, 4:24 PM

## 2019-09-19 ENCOUNTER — Other Ambulatory Visit: Payer: Self-pay

## 2019-09-19 ENCOUNTER — Encounter (HOSPITAL_COMMUNITY): Payer: Self-pay | Admitting: Internal Medicine

## 2019-09-19 DIAGNOSIS — F1020 Alcohol dependence, uncomplicated: Secondary | ICD-10-CM

## 2019-09-19 LAB — RETICULOCYTES
Immature Retic Fract: 26.3 % — ABNORMAL HIGH (ref 2.3–15.9)
RBC.: 3.44 MIL/uL — ABNORMAL LOW (ref 3.87–5.11)
Retic Count, Absolute: 47 10*3/uL (ref 19.0–186.0)
Retic Ct Pct: 1.4 % (ref 0.4–3.1)

## 2019-09-19 LAB — BASIC METABOLIC PANEL
Anion gap: 9 (ref 5–15)
BUN: 7 mg/dL (ref 6–20)
CO2: 23 mmol/L (ref 22–32)
Calcium: 9.2 mg/dL (ref 8.9–10.3)
Chloride: 109 mmol/L (ref 98–111)
Creatinine, Ser: 0.64 mg/dL (ref 0.44–1.00)
GFR calc Af Amer: >60 mL/min (ref >60)
GFR calc non Af Amer: >60 mL/min (ref >60)
Glucose, Bld: 101 mg/dL — ABNORMAL HIGH (ref 70–99)
Potassium: 3.6 mmol/L (ref 3.5–5.1)
Sodium: 141 mmol/L (ref 135–145)

## 2019-09-19 LAB — CBC
HCT: 24.5 % — ABNORMAL LOW (ref 36.0–46.0)
Hemoglobin: 7.2 g/dL — ABNORMAL LOW (ref 12.0–15.0)
MCH: 22.8 pg — ABNORMAL LOW (ref 26.0–34.0)
MCHC: 29.4 g/dL — ABNORMAL LOW (ref 30.0–36.0)
MCV: 77.5 fL — ABNORMAL LOW (ref 80.0–100.0)
Platelets: 159 10*3/uL (ref 150–400)
RBC: 3.16 MIL/uL — ABNORMAL LOW (ref 3.87–5.11)
RDW: 18.1 % — ABNORMAL HIGH (ref 11.5–15.5)
WBC: 5.7 10*3/uL (ref 4.0–10.5)
nRBC: 0 % (ref 0.0–0.2)

## 2019-09-19 LAB — IRON AND TIBC
Iron: 22 ug/dL — ABNORMAL LOW (ref 28–170)
Saturation Ratios: 4 % — ABNORMAL LOW (ref 10.4–31.8)
TIBC: 525 ug/dL — ABNORMAL HIGH (ref 250–450)
UIBC: 503 ug/dL

## 2019-09-19 LAB — FERRITIN: Ferritin: 30 ng/mL (ref 11–307)

## 2019-09-19 MED ORDER — LACTULOSE 10 GM/15ML PO SOLN
20.0000 g | Freq: Every day | ORAL | Status: DC
  Administered 2019-09-19: 20 g via ORAL
  Filled 2019-09-19 (×2): qty 30

## 2019-09-19 MED ORDER — ACETAMINOPHEN 500 MG PO TABS: 500.0000 mg | ORAL_TABLET | Freq: Once | ORAL | Status: DC

## 2019-09-19 MED ORDER — SODIUM CHLORIDE 0.9 % IV SOLN
510.0000 mg | Freq: Once | INTRAVENOUS | Status: AC
  Administered 2019-09-19: 510 mg via INTRAVENOUS
  Filled 2019-09-19: qty 17

## 2019-09-19 MED ORDER — ACETAMINOPHEN 650 MG RE SUPP: 650.0000 mg | Freq: Four times a day (QID) | RECTAL | Status: DC | PRN

## 2019-09-19 MED ORDER — ACETAMINOPHEN 325 MG PO TABS
325.0000 mg | ORAL_TABLET | Freq: Four times a day (QID) | ORAL | Status: DC | PRN
  Administered 2019-09-19 (×3): 325 mg via ORAL
  Filled 2019-09-19 (×3): qty 1

## 2019-09-19 NOTE — Progress Notes (Signed)
Pt's foley D/C per order (1500). Pt tolerated well. Pt due to void. Will continue to monitor.

## 2019-09-19 NOTE — Plan of Care (Signed)
°  Problem: Coping: °Goal: Level of anxiety will decrease °Outcome: Progressing °  °

## 2019-09-19 NOTE — Progress Notes (Addendum)
NAME:  LADELLE ENOCH, MRN:  [id number], DOB:  03/27/1966, LOS: 3 ADMISSION DATE:  09/16/2019   Subjective/Interm history  No overnight events VSS Visited with her and her husband today regarding OT recs for rehabilitation. They both agreed she will go home.  Also visited privately with her husband about safe discharge. Encouraged him to remove all alcohol and other substances she may misuse from the home. We discussed how she will ultimately require further evaluation with psychiatry as her primary issue with alcohol dependence is something that will require long term management. He notes that she has refused this in the past and is in denial. I stressed the importance of her abstaining from alcohol in light of her liver cirrhosis which he states he was unaware of.  Objective   Blood pressure 130/73, pulse 66, temperature 98.6 F (37 C), temperature source Oral, resp. rate 20, height 5\' 5"  (1.651 m), weight 62.1 kg, SpO2 98 %.     Intake/Output Summary (Last 24 hours) at 09/19/2019 1254 Last data filed at 09/19/2019 0900 Gross per 24 hour  Intake 480 ml  Output 1450 ml  Net -970 ml   Filed Weights   09/16/19 0646 09/16/19 2133  Weight: 68 kg 62.1 kg    Examination: GENERAL: chronically ill appearing HEENT: occipital scalp laceration with staples in place surrounded by dried blood CARDIAC: heart RRR. No peripheral edema.  PULMONARY: acyanotic. Lung sounds clear to auscultation. ABDOMEN: soft. Nontender to palpation.  Nondistended.  NEURO: a/ox3. Much more awake and alert this morning. No focal deficits appreciated. SKIN: bruises to bilateral lower extremities. PSYCH: flat, tearful affect.  Significant Diagnostic Tests:  2/27 CT head/neck>>no acute findings 2/27 CT abd/pelvis>>circumfrential thickening of the distal esophagus  Labs    CBC Latest Ref Rng & Units 09/19/2019 09/18/2019 09/17/2019  WBC 4.0 - 10.5 K/uL 5.7 5.7 9.5  Hemoglobin 12.0 - 15.0 g/dL 7.2(L) 7.6(L) 7.7(L)    Hematocrit 36.0 - 46.0 % 24.5(L) 25.5(L) 25.9(L)  Platelets 150 - 400 K/uL 159 162 218   BMP Latest Ref Rng & Units 09/19/2019 09/18/2019 09/17/2019  Glucose 70 - 99 mg/dL 101(H) 108(H) 120(H)  BUN 6 - 20 mg/dL 7 <5(L) 10  Creatinine 0.44 - 1.00 mg/dL 0.64 0.74 1.27(H)  Sodium 135 - 145 mmol/L 141 145 144  Potassium 3.5 - 5.1 mmol/L 3.6 3.3(L) 3.3(L)  Chloride 98 - 111 mmol/L 109 110 113(H)  CO2 22 - 32 mmol/L 23 25 21(L)  Calcium 8.9 - 10.3 mg/dL 9.2 9.0 8.4(L)    Summary  Kaly Fichtner is a 54 yo female with a PMH significant for polysubstance abuse, anxiety/depression, alcohol dependence with subsequent liver cirrhosis>esophageal varicies, COPD, and hypertension who presented to [facility] on 2/27 for AMS after being found unresponsive by her husband laying in a pool of blood from a head laceration. Husband reportedly noted that Davette has been drinking mouthwash and rubbing alcohol since he is not allowing alcohol in the house.   Assessment & Plan:  Active Problems:   AMS (altered mental status)  Acute encephalopathy. Alcohol dependence. CIWAs improved 1-6 overnight Plan Continue librium taper Thiamine, folate, multivitamin  Acute blood loss anemia superimposed on microcytic anemia. Likely from scalp lac.  Total transfusions 1 hgb slightly down 7.6>7.2 today. Platelets stable today at 159 Plan Will obtain an iron panel and retic count Will transfuse iron if indicated  Depression/anxiety. Continue fluoxetine, busprinone. Will need close outpatient follow up with psych for progressive symptoms prior to admission.  Alcoholic  liver cirrhosis with portal hypertension and esophageal varicies. Continue propranolol and protonix. Maintain tylenol ingestion <2g/day.  Best practice:  CODE STATUS: Full Diet: cardiac DVT for prophylaxis: lovenox Dispo: discharge 3/3 am. Will need outpatient follow up for esophageal thickening as seen on CT a/p.   Mitzi Hansen, MD INTERNAL  MEDICINE RESIDENT PGY-1 PAGER #: [phone] 09/19/19  12:54 PM

## 2019-09-19 NOTE — Discharge Summary (Signed)
Name: Amy Villa MRN: [id number] DOB: 28-Dec-1965 54 y.o. PCP: Patient, No Pcp Per  Date of Admission: 09/16/2019  6:31 AM Date of Discharge: 09/20/19 Attending Physician: Lucious Groves, DO  Discharge Diagnosis: 1. Alcohol Dependence Disorder 2. Alcoholic Liver Cirrhosis with esophageal varicies 3. Acute blood loss anemia superimposed on iron deficiency and hypoprolifertive anemia  Discharge Medications: Allergies as of 09/20/2019   No Known Allergies     Medication List    STOP taking these medications   amoxicillin 500 MG capsule Commonly known as: AMOXIL   cyclobenzaprine 10 MG tablet Commonly known as: FLEXERIL   gabapentin 100 MG capsule Commonly known as: NEURONTIN     TAKE these medications   albuterol 108 (90 Base) MCG/ACT inhaler Commonly known as: VENTOLIN HFA Inhale 1 puff into the lungs every 6 (six) hours as needed for wheezing or shortness of breath.   Artificial Tears 1.4 % ophthalmic solution Generic drug: polyvinyl alcohol Place 1 drop into both eyes daily.   aspirin EC 325 MG tablet Take 325 mg by mouth daily as needed (pain).   busPIRone 15 MG tablet Commonly known as: BUSPAR Take 15 mg by mouth 2 (two) times daily.   FLUoxetine 40 MG capsule Commonly known as: PROZAC Take 40 mg by mouth daily.   fluticasone 50 MCG/ACT nasal spray Commonly known as: FLONASE Place 2 sprays into both nostrils daily as needed for allergies or rhinitis.   folic acid 1 MG tablet Commonly known as: FOLVITE Take 1 tablet (1 mg total) by mouth daily.   ibuprofen 800 MG tablet Commonly known as: ADVIL Take 800 mg by mouth every 8 (eight) hours as needed (pain).   lactulose 10 GM/15ML solution Commonly known as: CHRONULAC Take 20 g by mouth daily.   multivitamin with minerals Tabs tablet Take 1 tablet by mouth daily.   nystatin 100000 UNIT/ML suspension Commonly known as: MYCOSTATIN Take 5 mLs by mouth 4 (four) times daily as needed (thrush from  inhalers (swish and spit)).   ondansetron 4 MG disintegrating tablet Commonly known as: ZOFRAN-ODT Take 4 mg by mouth every 8 (eight) hours as needed for nausea or vomiting.   pantoprazole 40 MG tablet Commonly known as: PROTONIX Take 40 mg by mouth daily.   propranolol 20 MG tablet Commonly known as: INDERAL Take 20 mg by mouth 2 (two) times daily.   thiamine 100 MG tablet Take 1 tablet (100 mg total) by mouth daily.   Vitamin D (Ergocalciferol) 1.25 MG (50000 UNIT) Caps capsule Commonly known as: DRISDOL Take 50,000 Units by mouth every Monday.       Disposition and follow-up:   [name] was discharged from [facility] in Stable condition.  At the hospital follow up visit please address:  1.  Alcohol dependence. Depression. Re-evaluate with husband that all alcohol and other items she may misuse have been removed from the home. She will need to be followed closely with psychiatry if she is amenable to it.  2. Head laceration with acute blood loss anemia. Head laceration occurred as a result of falling after LOC from ingesting rubbing alcohol/mouthwash. 14 staples were placed on 2/27 and should be removed around 09/26/19. She did also have an initial hemoglobin in the mid 6's and required blood transfusions. Hemoglobin was 7.2 at discharge. Please recheck hgb level at time of hospital follow up.  3. Alcoholic Liver Cirhosis with esophageal varices. Please ensure she has close follow up with GI for this  and reiterate the importance of abstaining from alcohol.   4. Acute blood loss anemia superimposed on iron deficient and hypoprolifertive anemia. She will require age appropriate screening and further evaluation of hypoprolifertive anemia.   Follow-up Appointments: GI, psychaiatry Follow-up Information    Gastroenterology Follow up.   Why: For follow up on liver cirrhosis and esophageal thickening seen on CT       Psychiatry Follow up.   Why: For  follow up on depression, alcohol use disorder       PCP Follow up.   Why: For hospital follow up and evaluation of iron deficiency, hypoproliferative anemia          Hospital Course: Amy Villa is a 54 y.o. yo female with a PMH of substance abuse disorder, depression and alcoholic liver cirrhosis with esophageal varicies. She presented on 09/16/19 by EMS after being found unresponsive by her husband with a pool of blood around her head after sustaining a fall/LOC. By the time EMS arrived, she was conscious and moving.  Head imaging was negative for intracranial hemorrhage. She did require 14 staples for a head laceration related to the fall. Her husband did report that he recently removed all alcohol from the home and that he had caught the patient attempting to drink rubbing alcohol and mouthwash a couple of times prior. She was admitted to the internal medicine service for detox management.  Her initial CIWA scores were ranging from the mid teens to mid 2s and required significant doses of ativan. She was started on a librium taper and CIWA scores improved dramatically to 1-6. Her mental status improved over her hospitalization and her husband reported her to be at her baseline on day prior to discharge. Due to insurance issues, he requests that he arrange the necessary follow up appointments with GI and psychiatry himself as they will need to be through [facility]. Her hospital course was complicated by severe anemia with initial presenting hgb in the mid 6's and required a transfusion. On day prior to discharge hgb was 7.2. Iron panel was obtained suggesting iron deficient anemia. A reticulocyte count along with hematocrit also revealed an RPI of 0.41 thus suggesting a hypoprolifertive anemia as well. She received one dose of ferriheme prior to discharge.  Discharge Vitals:   BP 123/68 (BP Location: Left Arm)   Pulse 82   Temp 98.9 F (37.2 C) (Oral)   Resp 16   Ht 5\' 5"  (1.651  m)   Wt 62.1 kg   SpO2 93%   BMI 22.80 kg/m   Pertinent Labs, Studies, and Procedures:  CBC Latest Ref Rng & Units 09/20/2019 09/19/2019 09/18/2019  WBC 4.0 - 10.5 K/uL 7.5 5.7 5.7  Hemoglobin 12.0 - 15.0 g/dL 7.3(L) 7.2(L) 7.6(L)  Hematocrit 36.0 - 46.0 % 24.2(L) 24.5(L) 25.5(L)  Platelets 150 - 400 K/uL 155 159 162   BMP Latest Ref Rng & Units 09/19/2019 09/18/2019 09/17/2019  Glucose 70 - 99 mg/dL 101(H) 108(H) 120(H)  BUN 6 - 20 mg/dL 7 <5(L) 10  Creatinine 0.44 - 1.00 mg/dL 0.64 0.74 1.27(H)  Sodium 135 - 145 mmol/L 141 145 144  Potassium 3.5 - 5.1 mmol/L 3.6 3.3(L) 3.3(L)  Chloride 98 - 111 mmol/L 109 110 113(H)  CO2 22 - 32 mmol/L 23 25 21(L)  Calcium 8.9 - 10.3 mg/dL 9.2 9.0 8.4(L)     Discharge Instructions: F/u with PCP, psychiatry and GI.   SignedMitzi Hansen, MD 09/20/2019, 8:08 AM   Pager: [phone]

## 2019-09-19 NOTE — Evaluation (Signed)
Physical Therapy Evaluation Patient Details Name: Amy Villa MRN: [id number] DOB: July 18, 1966 Today's Date: 09/19/2019   History of Present Illness  54 yo female found unresponsive in the kitchen by her husband with head laceration due to fall. Hx of substance/alcohol abuse and psychiatric issues per family. CTH, CXR, xray of c-spine all clear. Admitted with AMS in setting of ethanol and isopropyl alcohol ingestion, acute anemia due to bleeding from laceration. PMH polysubstance abuse, anxiety/depression, COPD, HTN  Clinical Impression   Patient received in bed, pleasant and willing to work with therapy today. See below for mobility/assist levels. Able to complete all mobility with min guard-ModA with RW, but very easily fatigued; remained dizzy throughout session today so gait distance limited for safety concerns. Limited by foot pain, especially L foot, when ambulating even with RW. She was left up in the chair with all needs met, chair alarm active and RN aware of patient status/nursing staff present. Would recommend SNF however per RN family has been very opposed to this- thus will require HHPT and 24/7A moving forward.     Follow Up Recommendations Home health PT;Supervision/Assistance - 24 hour;Other (comment)(per nursing staff, family refusing SNF)    Equipment Recommendations  Rolling walker with 5" wheels;3in1 (PT)    Recommendations for Other Services       Precautions / Restrictions Precautions Precautions: Fall Restrictions Weight Bearing Restrictions: No      Mobility  Bed Mobility Overal bed mobility: Needs Assistance Bed Mobility: Supine to Sit     Supine to sit: Min guard     General bed mobility comments: min guard for safety, increased time and effort  Transfers Overall transfer level: Needs assistance Equipment used: Rolling walker (2 wheeled) Transfers: Sit to/from Stand Sit to Stand: Mod assist         General transfer comment: ModA to boost  to full standing position and gain balance  Ambulation/Gait Ambulation/Gait assistance: Min assist Gait Distance (Feet): 30 Feet Assistive device: Rolling walker (2 wheeled) Gait Pattern/deviations: Step-through pattern;Decreased step length - left;Decreased step length - right;Decreased stride length;Antalgic;Trunk flexed;Drifts right/left Gait velocity: decreased   General Gait Details: able to navigate around obstacles in the room with intermittent VC, short steps due to feet (L in particular) hurting; easily fatigued and MinA for balance as fatigue increased  Stairs            Wheelchair Mobility    Modified Rankin (Stroke Patients Only)       Balance Overall balance assessment: Needs assistance Sitting-balance support: Feet supported;Bilateral upper extremity supported Sitting balance-Leahy Scale: Good Sitting balance - Comments: S for safety   Standing balance support: Bilateral upper extremity supported;During functional activity Standing balance-Leahy Scale: Poor Standing balance comment: heavy reliance on BUE support and still needed external support when fatigued                             Pertinent Vitals/Pain Pain Assessment: 0-10 Pain Score: 5  Pain Location: head/headache Pain Descriptors / Indicators: Headache;Grimacing;Discomfort Pain Intervention(s): Limited activity within patient's tolerance;Monitored during session;Repositioned    Home Living Family/patient expects to be discharged to:: Private residence Living Arrangements: Spouse/significant other;Children Available Help at Discharge: Family Type of Home: House Home Access: Stairs to enter Entrance Stairs-Rails: Right;Left;Can reach both Technical brewer of Steps: 3 Home Layout: One level Home Equipment: Shower seat - built in;Grab bars - tub/shower;Crutches      Prior Function Level of Independence: Independent  Comments: reports memory issues ongoing for  past few months      Hand Dominance        Extremity/Trunk Assessment   Upper Extremity Assessment Upper Extremity Assessment: Defer to OT evaluation    Lower Extremity Assessment Lower Extremity Assessment: Generalized weakness    Cervical / Trunk Assessment Cervical / Trunk Assessment: Normal  Communication   Communication: No difficulties  Cognition Arousal/Alertness: Awake/alert Behavior During Therapy: WFL for tasks assessed/performed;Anxious Overall Cognitive Status: Impaired/Different from baseline Area of Impairment: Orientation;Attention;Following commands;Awareness;Problem solving                 Orientation Level: Person;Place;Time;Situation Current Attention Level: Sustained   Following Commands: Follows one step commands consistently;Follows one step commands with increased time   Awareness: Intellectual Problem Solving: Slow processing;Decreased initiation;Requires verbal cues        General Comments General comments (skin integrity, edema, etc.): VSS, remained dizzy throughout session    Exercises     Assessment/Plan    PT Assessment Patient needs continued PT services  PT Problem List Decreased strength;Decreased knowledge of use of DME;Decreased activity tolerance;Decreased safety awareness;Decreased balance;Decreased mobility;Decreased coordination       PT Treatment Interventions DME instruction;Balance training;Gait training;Stair training;Functional mobility training;Patient/family education;Therapeutic activities;Therapeutic exercise    PT Goals (Current goals can be found in the Care Plan section)  Acute Rehab PT Goals Patient Stated Goal: return home to her two cats and dog PT Goal Formulation: With patient Time For Goal Achievement: 10/03/19 Potential to Achieve Goals: Good    Frequency Min 3X/week   Barriers to discharge        Co-evaluation               AM-PAC PT "6 Clicks" Mobility  Outcome Measure Help  needed turning from your back to your side while in a flat bed without using bedrails?: A Little Help needed moving from lying on your back to sitting on the side of a flat bed without using bedrails?: A Little Help needed moving to and from a bed to a chair (including a wheelchair)?: A Lot Help needed standing up from a chair using your arms (e.g., wheelchair or bedside chair)?: A Lot Help needed to walk in hospital room?: A Little Help needed climbing 3-5 steps with a railing? : A Lot 6 Click Score: 15    End of Session Equipment Utilized During Treatment: Gait belt Activity Tolerance: Patient tolerated treatment well;Patient limited by fatigue Patient left: in chair;with call bell/phone within reach;with chair alarm set Nurse Communication: Mobility status PT Visit Diagnosis: Unsteadiness on feet (R26.81);Muscle weakness (generalized) (M62.81);History of falling (Z91.81)    Time: 1129-1205 PT Time Calculation (min) (ACUTE ONLY): 36 min   Charges:   PT Evaluation $PT Eval Low Complexity: 1 Low PT Treatments $Gait Training: 8-22 mins        Windell Norfolk, DPT, PN1   Supplemental Physical Therapist [place]    Pager [phone] Acute Rehab Office [phone]

## 2019-09-20 DIAGNOSIS — D619 Aplastic anemia, unspecified: Secondary | ICD-10-CM

## 2019-09-20 DIAGNOSIS — D509 Iron deficiency anemia, unspecified: Secondary | ICD-10-CM

## 2019-09-20 LAB — CBC
HCT: 24.2 % — ABNORMAL LOW (ref 36.0–46.0)
Hemoglobin: 7.3 g/dL — ABNORMAL LOW (ref 12.0–15.0)
MCH: 23.6 pg — ABNORMAL LOW (ref 26.0–34.0)
MCHC: 30.2 g/dL (ref 30.0–36.0)
MCV: 78.3 fL — ABNORMAL LOW (ref 80.0–100.0)
Platelets: 155 10*3/uL (ref 150–400)
RBC: 3.09 MIL/uL — ABNORMAL LOW (ref 3.87–5.11)
RDW: 18.8 % — ABNORMAL HIGH (ref 11.5–15.5)
WBC: 7.5 10*3/uL (ref 4.0–10.5)
nRBC: 0 % (ref 0.0–0.2)

## 2019-09-20 MED ORDER — THIAMINE HCL 100 MG PO TABS: 100.0000 mg | ORAL_TABLET | Freq: Every day | ORAL | 0 refills | Status: DC

## 2019-09-20 MED ORDER — ACETAMINOPHEN 325 MG PO TABS
650.0000 mg | ORAL_TABLET | Freq: Once | ORAL | Status: AC
  Administered 2019-09-20: 650 mg via ORAL
  Filled 2019-09-20: qty 2

## 2019-09-20 MED ORDER — FOLIC ACID 1 MG PO TABS: 1.0000 mg | ORAL_TABLET | Freq: Every day | ORAL | 0 refills | Status: DC

## 2019-09-20 MED ORDER — IBUPROFEN 400 MG PO TABS
400.0000 mg | ORAL_TABLET | Freq: Four times a day (QID) | ORAL | Status: DC | PRN
  Administered 2019-09-20: 400 mg via ORAL
  Filled 2019-09-20: qty 1

## 2019-09-20 MED ORDER — RAMELTEON 8 MG PO TABS: 8.0000 mg | ORAL_TABLET | Freq: Every day | ORAL | Status: DC

## 2019-09-20 NOTE — Progress Notes (Signed)
   NAME:  Amy Villa, MRN:  [id number], DOB:  1965-11-04, LOS: 4 ADMISSION DATE:  09/16/2019   Subjective/Interm history  No overnight events VSS Discussed importance of alcohol cessation  Objective   Blood pressure 123/68, pulse 92, temperature 98.9 F (37.2 C), temperature source Oral, resp. rate 18, height 5\' 5"  (1.651 m), weight 62.1 kg, SpO2 98 %.     Intake/Output Summary (Last 24 hours) at 09/20/2019 0544 Last data filed at 09/20/2019 0200 Gross per 24 hour  Intake 815 ml  Output 1400 ml  Net -585 ml   Filed Weights   09/16/19 0646 09/16/19 2133  Weight: 68 kg 62.1 kg    Examination: GENERAL: in NAD CARDIAC: heart RRR. PULMONARY: Lung sounds clear to auscultation. ABDOMEN: soft. Nontender to palpation.  Nondistended.  NEURO: a/o x3 SKIN: occipital head laceration with surrounding dry blood. Overall skin pale PSYCH: normal affect  Significant Diagnostic Tests:  2/27 CT head/neck>>no acute findings 2/27 CT abd/pelvis>>circumfrential thickening of the distal esophagus  Labs    CBC Latest Ref Rng & Units 09/19/2019 09/18/2019 09/17/2019  WBC 4.0 - 10.5 K/uL 5.7 5.7 9.5  Hemoglobin 12.0 - 15.0 g/dL 7.2(L) 7.6(L) 7.7(L)  Hematocrit 36.0 - 46.0 % 24.5(L) 25.5(L) 25.9(L)  Platelets 150 - 400 K/uL 159 162 218   BMP Latest Ref Rng & Units 09/19/2019 09/18/2019 09/17/2019  Glucose 70 - 99 mg/dL 101(H) 108(H) 120(H)  BUN 6 - 20 mg/dL 7 <5(L) 10  Creatinine 0.44 - 1.00 mg/dL 0.64 0.74 1.27(H)  Sodium 135 - 145 mmol/L 141 145 144  Potassium 3.5 - 5.1 mmol/L 3.6 3.3(L) 3.3(L)  Chloride 98 - 111 mmol/L 109 110 113(H)  CO2 22 - 32 mmol/L 23 25 21(L)  Calcium 8.9 - 10.3 mg/dL 9.2 9.0 8.4(L)    Summary  Amy Villa is a 54 yo female with a PMH significant for polysubstance abuse, anxiety/depression, alcohol dependence with subsequent liver cirrhosis>esophageal varicies, COPD, and hypertension who presented to [facility] on 2/27 for AMS after being found unresponsive by her  husband laying in a pool of blood from a head laceration. Husband reportedly noted that Amy Villa has been drinking mouthwash and rubbing alcohol since he is not allowing alcohol in the house.   Assessment & Plan:  Active Problems:   AMS (altered mental status)  Acute encephalopathy. Alcohol dependence. CIWAs 0-1 Plan D/c librium at discharge Thiamine, folate, multivitamin  Acute blood loss anemia superimposed iron deficient hypoproliferitive anemia. Likely from scalp lac.  Hgb stable One dose of ferreheme infused last evening Plan Will need close outpatient monitoring of hgb Will need outpatient follow up for iron deficiency and hypoproliferitive anemia  Depression/anxiety. Continue fluoxetine, busprinone. Will need close outpatient follow up with psych for progressive symptoms prior to admission.  Alcoholic liver cirrhosis with portal hypertension and esophageal varicies. Continue propranolol and protonix. Maintain tylenol ingestion <2g/day.  Best practice:  CODE STATUS: Full Diet: cardiac DVT for prophylaxis: lovenox Dispo: medically stable for discharge. Will need outpatient follow up for esophageal thickening as seen on CT a/p.   Mitzi Hansen, MD INTERNAL MEDICINE RESIDENT PGY-1 PAGER #: [phone] 09/20/19  5:44 AM

## 2019-09-20 NOTE — Progress Notes (Signed)
Paged [facility] MD Adria Dill [phone] Rene Paci, C/o of pain not relieve by Tylenol, also insomnia. Can she have melatonin and something stronger for pain?

## 2019-09-20 NOTE — Care Management (Signed)
09-20-19 1015 Case Manager received consult for substance abuse counseling and home health needs. Case Manager spoke with patient and she is declining home health services and the 3n1 at this time and is also declining outpatient substance abuse resources. Patient states she will contact her primary care Physician Williams at [facility] in [place], [place] to assist her with substance abuse resources and to make the hospital follow up appointment. Patient has the blue local insurance. Patient was agreeable to the rolling walker and call was placed to [employer] for delivery and MD to place order. Awaiting call back from MD.  Case Manager just received notification @ 1029 from the staff nurse that patient's husband has declined the rolling walker- he did not want to wait on the durable medical equipment and that the patient would be fine at home. Case Manager called [employer] to cancel the order. Husband will provide transportation home in private vehicle. No further needs from Case Manager at this time. Bethena Roys, RN,BSN Case Manager

## 2019-09-20 NOTE — Progress Notes (Signed)
DISCHARGE NOTE HOME [facility] to be discharged Home per MD order. Discussed prescriptions and follow up appointments with the patient. Prescriptions given to patient; medication list explained in detail. Patient verbalized understanding.  Skin clean, dry and intact without evidence of skin break down, no evidence of skin tears noted. IV catheter discontinued intact. Site without signs and symptoms of complications. Dressing and pressure applied. Pt denies pain at the site currently. No complaints noted.  Patient free of lines, drains, and wounds.   An After Visit Summary (AVS) was printed and given to the patient. Patient escorted via wheelchair, and discharged home via private auto.  Aneta Mins BSN, RN3

## 2019-09-20 NOTE — Progress Notes (Signed)
PT Cancellation Note  Patient Details Name: Amy Villa MRN: [id number] DOB: 07-May-1966   Cancelled Treatment:    Reason Eval/Treat Not Completed: Patient declined, no reason specified patient soundly asleep, able to wake but she politely requests to try PT later in the morning, does want to try to walk out of here with a RW and assist. Will attempt to return if time/schedule allow.    Windell Norfolk, DPT, PN1   Supplemental Physical Therapist [facility]    Pager [phone] Acute Rehab Office [phone]

## 2019-09-20 NOTE — Discharge Instructions (Signed)
Thank you for placing your trust in the Internal Medicine Team here at [facility].  You will need to follow up with your PCP in 3-5 days to have them recheck your hemoglobin. You will also need to have your staples removed from your head around 09/26/19. You will also need to follow up with gastroenterology for your liver cirrhosis. Please also arrange an appointment with psychiatry to assist with adjusting your medications to help you cope with the stressors in your life. The most important thing for you do right now is to stop drinking any alcohol. Your liver has experienced serious irreversible damage from drinking alcohol in the past and the main thing you can do to prevent progression is to abstain from alcohol completely.

## 2019-09-21 ENCOUNTER — Encounter: Payer: Self-pay | Admitting: Physician Assistant

## 2019-11-10 ENCOUNTER — Inpatient Hospital Stay (HOSPITAL_COMMUNITY): Payer: BLUE CROSS/BLUE SHIELD

## 2019-11-10 ENCOUNTER — Encounter (HOSPITAL_COMMUNITY): Payer: Self-pay | Admitting: Emergency Medicine

## 2019-11-10 ENCOUNTER — Inpatient Hospital Stay (HOSPITAL_COMMUNITY)
Admission: EM | Admit: 2019-11-10 | Discharge: 2019-11-15 | DRG: 917 | Disposition: A | Discharge status: Home Health | Payer: BLUE CROSS/BLUE SHIELD | Attending: Internal Medicine | Admitting: Internal Medicine

## 2019-11-10 ENCOUNTER — Emergency Department (HOSPITAL_COMMUNITY): Payer: BLUE CROSS/BLUE SHIELD

## 2019-11-10 DIAGNOSIS — I10 Essential (primary) hypertension: Secondary | ICD-10-CM | POA: Diagnosis present

## 2019-11-10 DIAGNOSIS — F419 Anxiety disorder, unspecified: Secondary | ICD-10-CM | POA: Diagnosis present

## 2019-11-10 DIAGNOSIS — K760 Fatty (change of) liver, not elsewhere classified: Secondary | ICD-10-CM | POA: Diagnosis present

## 2019-11-10 DIAGNOSIS — T43591A Poisoning by other antipsychotics and neuroleptics, accidental (unintentional), initial encounter: Secondary | ICD-10-CM | POA: Diagnosis not present

## 2019-11-10 DIAGNOSIS — I8510 Secondary esophageal varices without bleeding: Secondary | ICD-10-CM | POA: Diagnosis not present

## 2019-11-10 DIAGNOSIS — I700 Atherosclerosis of aorta: Secondary | ICD-10-CM | POA: Diagnosis present

## 2019-11-10 DIAGNOSIS — I468 Cardiac arrest due to other underlying condition: Secondary | ICD-10-CM | POA: Diagnosis not present

## 2019-11-10 DIAGNOSIS — J9601 Acute respiratory failure with hypoxia: Secondary | ICD-10-CM

## 2019-11-10 DIAGNOSIS — Z20822 Contact with and (suspected) exposure to covid-19: Secondary | ICD-10-CM | POA: Diagnosis present

## 2019-11-10 DIAGNOSIS — K7030 Alcoholic cirrhosis of liver without ascites: Secondary | ICD-10-CM | POA: Diagnosis present

## 2019-11-10 DIAGNOSIS — Z79899 Other long term (current) drug therapy: Secondary | ICD-10-CM

## 2019-11-10 DIAGNOSIS — J449 Chronic obstructive pulmonary disease, unspecified: Secondary | ICD-10-CM | POA: Diagnosis present

## 2019-11-10 DIAGNOSIS — Z7983 Long term (current) use of bisphosphonates: Secondary | ICD-10-CM | POA: Diagnosis not present

## 2019-11-10 DIAGNOSIS — N179 Acute kidney failure, unspecified: Secondary | ICD-10-CM

## 2019-11-10 DIAGNOSIS — Z66 Do not resuscitate: Secondary | ICD-10-CM | POA: Diagnosis not present

## 2019-11-10 DIAGNOSIS — K219 Gastro-esophageal reflux disease without esophagitis: Secondary | ICD-10-CM | POA: Diagnosis present

## 2019-11-10 DIAGNOSIS — E872 Acidosis, unspecified: Secondary | ICD-10-CM

## 2019-11-10 DIAGNOSIS — J690 Pneumonitis due to inhalation of food and vomit: Secondary | ICD-10-CM

## 2019-11-10 DIAGNOSIS — Z801 Family history of malignant neoplasm of trachea, bronchus and lung: Secondary | ICD-10-CM

## 2019-11-10 DIAGNOSIS — E875 Hyperkalemia: Secondary | ICD-10-CM

## 2019-11-10 DIAGNOSIS — D689 Coagulation defect, unspecified: Secondary | ICD-10-CM | POA: Diagnosis not present

## 2019-11-10 DIAGNOSIS — Z8371 Family history of colonic polyps: Secondary | ICD-10-CM | POA: Diagnosis not present

## 2019-11-10 DIAGNOSIS — F32A Depression, unspecified: Secondary | ICD-10-CM | POA: Diagnosis present

## 2019-11-10 DIAGNOSIS — I469 Cardiac arrest, cause unspecified: Secondary | ICD-10-CM

## 2019-11-10 DIAGNOSIS — Z4659 Encounter for fitting and adjustment of other gastrointestinal appliance and device: Secondary | ICD-10-CM

## 2019-11-10 DIAGNOSIS — D509 Iron deficiency anemia, unspecified: Secondary | ICD-10-CM | POA: Diagnosis present

## 2019-11-10 DIAGNOSIS — K7460 Unspecified cirrhosis of liver: Secondary | ICD-10-CM | POA: Diagnosis present

## 2019-11-10 DIAGNOSIS — Z833 Family history of diabetes mellitus: Secondary | ICD-10-CM | POA: Diagnosis not present

## 2019-11-10 DIAGNOSIS — F329 Major depressive disorder, single episode, unspecified: Secondary | ICD-10-CM | POA: Diagnosis present

## 2019-11-10 DIAGNOSIS — Z8249 Family history of ischemic heart disease and other diseases of the circulatory system: Secondary | ICD-10-CM | POA: Diagnosis not present

## 2019-11-10 DIAGNOSIS — K7290 Hepatic failure, unspecified without coma: Secondary | ICD-10-CM | POA: Diagnosis present

## 2019-11-10 DIAGNOSIS — M1990 Unspecified osteoarthritis, unspecified site: Secondary | ICD-10-CM | POA: Diagnosis present

## 2019-11-10 DIAGNOSIS — D649 Anemia, unspecified: Secondary | ICD-10-CM | POA: Diagnosis present

## 2019-11-10 DIAGNOSIS — Z7982 Long term (current) use of aspirin: Secondary | ICD-10-CM

## 2019-11-10 DIAGNOSIS — G9341 Metabolic encephalopathy: Secondary | ICD-10-CM | POA: Diagnosis not present

## 2019-11-10 DIAGNOSIS — G43909 Migraine, unspecified, not intractable, without status migrainosus: Secondary | ICD-10-CM | POA: Diagnosis present

## 2019-11-10 DIAGNOSIS — Z915 Personal history of self-harm: Secondary | ICD-10-CM

## 2019-11-10 DIAGNOSIS — T68XXXA Hypothermia, initial encounter: Secondary | ICD-10-CM

## 2019-11-10 DIAGNOSIS — N170 Acute kidney failure with tubular necrosis: Secondary | ICD-10-CM | POA: Diagnosis not present

## 2019-11-10 DIAGNOSIS — R4182 Altered mental status, unspecified: Secondary | ICD-10-CM

## 2019-11-10 DIAGNOSIS — E876 Hypokalemia: Secondary | ICD-10-CM | POA: Diagnosis not present

## 2019-11-10 DIAGNOSIS — F1910 Other psychoactive substance abuse, uncomplicated: Secondary | ICD-10-CM | POA: Diagnosis present

## 2019-11-10 LAB — CBC WITH DIFFERENTIAL/PLATELET
Abs Immature Granulocytes: 2.78 10*3/uL — ABNORMAL HIGH (ref 0.00–0.07)
Basophils Absolute: 0.4 10*3/uL — ABNORMAL HIGH (ref 0.0–0.1)
Basophils Relative: 1 %
Eosinophils Absolute: 0.2 10*3/uL (ref 0.0–0.5)
Eosinophils Relative: 1 %
HCT: 46.9 % — ABNORMAL HIGH (ref 36.0–46.0)
Hemoglobin: 12.9 g/dL (ref 12.0–15.0)
Immature Granulocytes: 8 %
Lymphocytes Relative: 9 %
Lymphs Abs: 3.2 10*3/uL (ref 0.7–4.0)
MCH: 27.1 pg (ref 26.0–34.0)
MCHC: 27.5 g/dL — ABNORMAL LOW (ref 30.0–36.0)
MCV: 98.5 fL (ref 80.0–100.0)
Monocytes Absolute: 1.9 10*3/uL — ABNORMAL HIGH (ref 0.1–1.0)
Monocytes Relative: 5 %
Neutro Abs: 28 10*3/uL — ABNORMAL HIGH (ref 1.7–7.7)
Neutrophils Relative %: 76 %
Platelets: 191 10*3/uL (ref 150–400)
RBC: 4.76 MIL/uL (ref 3.87–5.11)
RDW: 22.5 % — ABNORMAL HIGH (ref 11.5–15.5)
WBC: 36.4 10*3/uL — ABNORMAL HIGH (ref 4.0–10.5)
nRBC: 0.1 % (ref 0.0–0.2)

## 2019-11-10 LAB — URINALYSIS, ROUTINE W REFLEX MICROSCOPIC
Bilirubin Urine: NEGATIVE
Glucose, UA: NEGATIVE mg/dL
Ketones, ur: NEGATIVE mg/dL
Leukocytes,Ua: NEGATIVE
Nitrite: NEGATIVE
Protein, ur: 100 mg/dL — AB
Specific Gravity, Urine: 1.008 (ref 1.005–1.030)
pH: 6 (ref 5.0–8.0)

## 2019-11-10 LAB — CBC
HCT: 46.9 % — ABNORMAL HIGH (ref 36.0–46.0)
Hemoglobin: 13.7 g/dL (ref 12.0–15.0)
MCH: 26.8 pg (ref 26.0–34.0)
MCHC: 29.2 g/dL — ABNORMAL LOW (ref 30.0–36.0)
MCV: 91.8 fL (ref 80.0–100.0)
Platelets: 154 10*3/uL (ref 150–400)
RBC: 5.11 MIL/uL (ref 3.87–5.11)
RDW: 22.5 % — ABNORMAL HIGH (ref 11.5–15.5)
WBC: 27.8 10*3/uL — ABNORMAL HIGH (ref 4.0–10.5)
nRBC: 0 % (ref 0.0–0.2)

## 2019-11-10 LAB — BASIC METABOLIC PANEL
Anion gap: 15 (ref 5–15)
Anion gap: 12 (ref 5–15)
BUN: 17 mg/dL (ref 6–20)
BUN: 18 mg/dL (ref 6–20)
CO2: 17 mmol/L — ABNORMAL LOW (ref 22–32)
CO2: 17 mmol/L — ABNORMAL LOW (ref 22–32)
Calcium: 8.1 mg/dL — ABNORMAL LOW (ref 8.9–10.3)
Calcium: 7.5 mg/dL — ABNORMAL LOW (ref 8.9–10.3)
Chloride: 102 mmol/L (ref 98–111)
Chloride: 105 mmol/L (ref 98–111)
Creatinine, Ser: 1.26 mg/dL — ABNORMAL HIGH (ref 0.44–1.00)
Creatinine, Ser: 1.06 mg/dL — ABNORMAL HIGH (ref 0.44–1.00)
GFR calc Af Amer: 56 mL/min — ABNORMAL LOW (ref >60)
GFR calc Af Amer: >60 mL/min (ref >60)
GFR calc non Af Amer: 49 mL/min — ABNORMAL LOW (ref >60)
GFR calc non Af Amer: 60 mL/min — ABNORMAL LOW (ref >60)
Glucose, Bld: 112 mg/dL — ABNORMAL HIGH (ref 70–99)
Glucose, Bld: 219 mg/dL — ABNORMAL HIGH (ref 70–99)
Potassium: 6.5 mmol/L (ref 3.5–5.1)
Potassium: 4.5 mmol/L (ref 3.5–5.1)
Sodium: 134 mmol/L — ABNORMAL LOW (ref 135–145)
Sodium: 134 mmol/L — ABNORMAL LOW (ref 135–145)

## 2019-11-10 LAB — BRAIN NATRIURETIC PEPTIDE: B Natriuretic Peptide: 235.7 pg/mL — ABNORMAL HIGH (ref 0.0–100.0)

## 2019-11-10 LAB — POCT I-STAT 7, (LYTES, BLD GAS, ICA,H+H)
Acid-base deficit: 16 mmol/L — ABNORMAL HIGH (ref 0.0–2.0)
Acid-base deficit: 11 mmol/L — ABNORMAL HIGH (ref 0.0–2.0)
Bicarbonate: 15.9 mmol/L — ABNORMAL LOW (ref 20.0–28.0)
Bicarbonate: 17 mmol/L — ABNORMAL LOW (ref 20.0–28.0)
Calcium, Ion: 1.03 mmol/L — ABNORMAL LOW (ref 1.15–1.40)
Calcium, Ion: 1.08 mmol/L — ABNORMAL LOW (ref 1.15–1.40)
HCT: 42 % (ref 36.0–46.0)
HCT: 42 % (ref 36.0–46.0)
Hemoglobin: 14.3 g/dL (ref 12.0–15.0)
Hemoglobin: 14.3 g/dL (ref 12.0–15.0)
O2 Saturation: 96 %
O2 Saturation: 95 %
Patient temperature: 34.2
Potassium: 5.5 mmol/L — ABNORMAL HIGH (ref 3.5–5.1)
Potassium: 4.5 mmol/L (ref 3.5–5.1)
Sodium: 133 mmol/L — ABNORMAL LOW (ref 135–145)
Sodium: 133 mmol/L — ABNORMAL LOW (ref 135–145)
TCO2: 18 mmol/L — ABNORMAL LOW (ref 22–32)
TCO2: 18 mmol/L — ABNORMAL LOW (ref 22–32)
pCO2 arterial: 60.4 mmHg — ABNORMAL HIGH (ref 32.0–48.0)
pCO2 arterial: 39.1 mmHg (ref 32.0–48.0)
pH, Arterial: 7.027 — CL (ref 7.350–7.450)
pH, Arterial: 7.23 — ABNORMAL LOW (ref 7.350–7.450)
pO2, Arterial: 119 mmHg — ABNORMAL HIGH (ref 83.0–108.0)
pO2, Arterial: 78 mmHg — ABNORMAL LOW (ref 83.0–108.0)

## 2019-11-10 LAB — RAPID URINE DRUG SCREEN, HOSP PERFORMED
Amphetamines: NOT DETECTED
Barbiturates: NOT DETECTED
Benzodiazepines: NOT DETECTED
Cocaine: NOT DETECTED
Opiates: POSITIVE — AB
Tetrahydrocannabinol: NOT DETECTED

## 2019-11-10 LAB — TYPE AND SCREEN
ABO/RH(D): A NEG
Antibody Screen: NEGATIVE

## 2019-11-10 LAB — PROTIME-INR
INR: 1.7 — ABNORMAL HIGH (ref 0.8–1.2)
Prothrombin Time: 19.5 seconds — ABNORMAL HIGH (ref 11.4–15.2)

## 2019-11-10 LAB — COMPREHENSIVE METABOLIC PANEL
ALT: 252 U/L — ABNORMAL HIGH (ref 0–44)
AST: 318 U/L — ABNORMAL HIGH (ref 15–41)
Albumin: 3.7 g/dL (ref 3.5–5.0)
Alkaline Phosphatase: 173 U/L — ABNORMAL HIGH (ref 38–126)
Anion gap: 19 — ABNORMAL HIGH (ref 5–15)
BUN: 18 mg/dL (ref 6–20)
CO2: 12 mmol/L — ABNORMAL LOW (ref 22–32)
Calcium: 8.2 mg/dL — ABNORMAL LOW (ref 8.9–10.3)
Chloride: 103 mmol/L (ref 98–111)
Creatinine, Ser: 1.67 mg/dL — ABNORMAL HIGH (ref 0.44–1.00)
GFR calc Af Amer: 40 mL/min — ABNORMAL LOW (ref >60)
GFR calc non Af Amer: 35 mL/min — ABNORMAL LOW (ref >60)
Glucose, Bld: 136 mg/dL — ABNORMAL HIGH (ref 70–99)
Potassium: 5.4 mmol/L — ABNORMAL HIGH (ref 3.5–5.1)
Sodium: 134 mmol/L — ABNORMAL LOW (ref 135–145)
Total Bilirubin: 0.7 mg/dL (ref 0.3–1.2)
Total Protein: 6.8 g/dL (ref 6.5–8.1)

## 2019-11-10 LAB — LACTIC ACID, PLASMA
Lactic Acid, Venous: 9.3 mmol/L (ref 0.5–1.9)
Lactic Acid, Venous: 3.8 mmol/L (ref 0.5–1.9)

## 2019-11-10 LAB — T4, FREE: Free T4: 0.71 ng/dL (ref 0.61–1.12)

## 2019-11-10 LAB — GLUCOSE, CAPILLARY
Glucose-Capillary: 111 mg/dL — ABNORMAL HIGH (ref 70–99)
Glucose-Capillary: 114 mg/dL — ABNORMAL HIGH (ref 70–99)
Glucose-Capillary: 152 mg/dL — ABNORMAL HIGH (ref 70–99)
Glucose-Capillary: 92 mg/dL (ref 70–99)

## 2019-11-10 LAB — SALICYLATE LEVEL: Salicylate Lvl: <7 mg/dL — ABNORMAL LOW (ref 7.0–30.0)

## 2019-11-10 LAB — AMMONIA: Ammonia: 122 umol/L — ABNORMAL HIGH (ref 9–35)

## 2019-11-10 LAB — MRSA PCR SCREENING: MRSA by PCR: NEGATIVE

## 2019-11-10 LAB — RESPIRATORY PANEL BY RT PCR (FLU A&B, COVID)
Influenza A by PCR: NEGATIVE
Influenza B by PCR: NEGATIVE
SARS Coronavirus 2 by RT PCR: NEGATIVE

## 2019-11-10 LAB — I-STAT BETA HCG BLOOD, ED (MC, WL, AP ONLY): I-stat hCG, quantitative: <5 m[IU]/mL (ref <5)

## 2019-11-10 LAB — ABO/RH: ABO/RH(D): A NEG

## 2019-11-10 LAB — TSH: TSH: 8.099 u[IU]/mL — ABNORMAL HIGH (ref 0.350–4.500)

## 2019-11-10 LAB — APTT: aPTT: 64 seconds — ABNORMAL HIGH (ref 24–36)

## 2019-11-10 LAB — ETHANOL: Alcohol, Ethyl (B): <10 mg/dL (ref <10)

## 2019-11-10 LAB — TROPONIN I (HIGH SENSITIVITY)
Troponin I (High Sensitivity): 48 ng/L — ABNORMAL HIGH (ref <18)
Troponin I (High Sensitivity): 84 ng/L — ABNORMAL HIGH (ref <18)

## 2019-11-10 MED ORDER — DOCUSATE SODIUM 100 MG PO CAPS: 100.0000 mg | ORAL_CAPSULE | Freq: Two times a day (BID) | ORAL | Status: DC | PRN

## 2019-11-10 MED ORDER — POLYETHYLENE GLYCOL 3350 17 G PO PACK: 17.0000 g | PACK | Freq: Every day | ORAL | Status: DC | PRN

## 2019-11-10 MED ORDER — SODIUM CHLORIDE 0.9 % IV SOLN
2.0000 g | Freq: Once | INTRAVENOUS | Status: AC
  Administered 2019-11-10: 2 g via INTRAVENOUS
  Filled 2019-11-10: qty 2

## 2019-11-10 MED ORDER — SODIUM CHLORIDE 0.9 % IV SOLN
1.5000 g | Freq: Four times a day (QID) | INTRAVENOUS | Status: DC
  Administered 2019-11-10 – 2019-11-11 (×3): 1.5 g via INTRAVENOUS
  Filled 2019-11-10 (×3): qty 4
  Filled 2019-11-10: qty 1.5
  Filled 2019-11-10 (×2): qty 4

## 2019-11-10 MED ORDER — METRONIDAZOLE IN NACL 5-0.79 MG/ML-% IV SOLN
500.0000 mg | Freq: Once | INTRAVENOUS | Status: AC
  Administered 2019-11-10: 500 mg via INTRAVENOUS
  Filled 2019-11-10: qty 100

## 2019-11-10 MED ORDER — ROCURONIUM BROMIDE 50 MG/5ML IV SOLN
INTRAVENOUS | Status: AC | PRN
  Administered 2019-11-10: 90 mg via INTRAVENOUS

## 2019-11-10 MED ORDER — DEXTROSE 50 % IV SOLN
1.0000 | Freq: Once | INTRAVENOUS | Status: AC
  Administered 2019-11-10: 19:00:00 50 mL via INTRAVENOUS
  Filled 2019-11-10: qty 50

## 2019-11-10 MED ORDER — CALCIUM GLUCONATE-NACL 1-0.675 GM/50ML-% IV SOLN
1.0000 g | Freq: Once | INTRAVENOUS | Status: AC
  Administered 2019-11-10: 1000 mg via INTRAVENOUS
  Filled 2019-11-10: qty 50

## 2019-11-10 MED ORDER — LACTULOSE 10 GM/15ML PO SOLN
30.0000 g | Freq: Three times a day (TID) | ORAL | Status: DC
  Administered 2019-11-10 (×2): 30 g
  Filled 2019-11-10 (×3): qty 45

## 2019-11-10 MED ORDER — POLYETHYLENE GLYCOL 3350 17 G PO PACK: 17.0000 g | PACK | Freq: Every day | ORAL | Status: DC

## 2019-11-10 MED ORDER — BENZONATATE 100 MG PO CAPS: 100.0000 mg | ORAL_CAPSULE | Freq: Once | ORAL | Status: DC

## 2019-11-10 MED ORDER — FENTANYL CITRATE (PF) 100 MCG/2ML IJ SOLN
25.0000 ug | INTRAMUSCULAR | Status: DC | PRN
  Administered 2019-11-10 – 2019-11-11 (×3): 100 ug via INTRAVENOUS
  Filled 2019-11-10 (×2): qty 2

## 2019-11-10 MED ORDER — DOCUSATE SODIUM 50 MG/5ML PO LIQD
100.0000 mg | Freq: Two times a day (BID) | ORAL | Status: DC
  Filled 2019-11-10 (×2): qty 10

## 2019-11-10 MED ORDER — SODIUM CHLORIDE 0.9 % IV BOLUS (SEPSIS)
1000.0000 mL | Freq: Once | INTRAVENOUS | Status: AC
  Administered 2019-11-10: 1000 mL via INTRAVENOUS

## 2019-11-10 MED ORDER — ASPIRIN 81 MG PO CHEW: 324.0000 mg | CHEWABLE_TABLET | ORAL | Status: DC

## 2019-11-10 MED ORDER — HEPARIN SODIUM (PORCINE) 5000 UNIT/ML IJ SOLN
5000.0000 [IU] | Freq: Three times a day (TID) | INTRAMUSCULAR | Status: DC
  Administered 2019-11-10 – 2019-11-15 (×15): 5000 [IU] via SUBCUTANEOUS
  Filled 2019-11-10 (×15): qty 1

## 2019-11-10 MED ORDER — INSULIN ASPART 100 UNIT/ML ~~LOC~~ SOLN
10.0000 [IU] | Freq: Once | SUBCUTANEOUS | Status: AC
  Administered 2019-11-10: 10 [IU] via INTRAVENOUS

## 2019-11-10 MED ORDER — ORAL CARE MOUTH RINSE
15.0000 mL | OROMUCOSAL | Status: DC
  Administered 2019-11-10 – 2019-11-11 (×7): 15 mL via OROMUCOSAL

## 2019-11-10 MED ORDER — MAGNESIUM SULFATE 2 GM/50ML IV SOLN
2.0000 g | Freq: Once | INTRAVENOUS | Status: AC
  Administered 2019-11-10: 2 g via INTRAVENOUS
  Filled 2019-11-10: qty 50

## 2019-11-10 MED ORDER — PHYTONADIONE 5 MG PO TABS
10.0000 mg | ORAL_TABLET | Freq: Once | ORAL | Status: AC
  Administered 2019-11-10: 10 mg
  Filled 2019-11-10 (×2): qty 2

## 2019-11-10 MED ORDER — ETOMIDATE 2 MG/ML IV SOLN
INTRAVENOUS | Status: AC | PRN
  Administered 2019-11-10: 20 mg via INTRAVENOUS

## 2019-11-10 MED ORDER — VANCOMYCIN HCL IN DEXTROSE 1-5 GM/200ML-% IV SOLN: 1000.0000 mg | Freq: Once | INTRAVENOUS | Status: DC

## 2019-11-10 MED ORDER — VANCOMYCIN HCL 1250 MG/250ML IV SOLN
1250.0000 mg | Freq: Once | INTRAVENOUS | Status: AC
  Administered 2019-11-10: 1250 mg via INTRAVENOUS
  Filled 2019-11-10: qty 250

## 2019-11-10 MED ORDER — SODIUM ZIRCONIUM CYCLOSILICATE 5 G PO PACK
10.0000 g | PACK | Freq: Four times a day (QID) | ORAL | Status: AC
  Administered 2019-11-10 (×2): 10 g
  Filled 2019-11-10 (×2): qty 2

## 2019-11-10 MED ORDER — DOCUSATE SODIUM 50 MG/5ML PO LIQD
100.0000 mg | Freq: Two times a day (BID) | ORAL | Status: DC
  Administered 2019-11-10 (×2): 100 mg
  Filled 2019-11-10: qty 10

## 2019-11-10 MED ORDER — POLYETHYLENE GLYCOL 3350 17 G PO PACK
17.0000 g | PACK | Freq: Every day | ORAL | Status: DC
  Administered 2019-11-10: 17 g
  Filled 2019-11-10: qty 1

## 2019-11-10 MED ORDER — LACTATED RINGERS IV SOLN: INTRAVENOUS | Status: DC

## 2019-11-10 MED ORDER — PANTOPRAZOLE SODIUM 40 MG IV SOLR
40.0000 mg | Freq: Every day | INTRAVENOUS | Status: DC
  Administered 2019-11-10: 40 mg via INTRAVENOUS
  Filled 2019-11-10: qty 40

## 2019-11-10 MED ORDER — FENTANYL CITRATE (PF) 100 MCG/2ML IJ SOLN: 25.0000 ug | INTRAMUSCULAR | Status: DC | PRN

## 2019-11-10 MED ORDER — ASPIRIN 300 MG RE SUPP: 300.0000 mg | RECTAL | Status: DC

## 2019-11-10 MED ORDER — CHLORHEXIDINE GLUCONATE 0.12% ORAL RINSE (MEDLINE KIT)
15.0000 mL | Freq: Two times a day (BID) | OROMUCOSAL | Status: DC
  Administered 2019-11-10 – 2019-11-11 (×2): 15 mL via OROMUCOSAL

## 2019-11-10 NOTE — Progress Notes (Signed)
CCM MD & NP were paged regarding ABG results.

## 2019-11-10 NOTE — ED Provider Notes (Signed)
[facility] EMERGENCY DEPARTMENT Provider Note   CSN: [id number] Arrival date & time: 11/10/19  8295     History Chief Complaint  Patient presents with  . CPR    Amy Villa is a 54 y.o. female.  The history is provided by medical records and the EMS personnel. The history is limited by the condition of the patient.  Cardiac Arrest Witnessed by:  Not witnessed Incident location:  Home Initial cardiac rhythm per EMS:  Asystole Treatments prior to arrival:  ACLS protocol Medications given prior to ED:  Epinephrine Rhythm on admission to ED:  Unchanged  LVL 5 caveat for AMS and post arrest.      Past Medical History:  Diagnosis Date  . Alcoholic hepatitis   . Aortic atherosclerosis ([place])   . Arthritis    neck  . Carpal tunnel syndrome on both sides 02/2012  . Cigarette nicotine dependence   . Contact lens/glasses fitting    wears contacts or glasses  . Dental crowns present    x 2 upper front  . Elevated LFTs   . Fatty liver   . Fluid retention   . History of MRSA infection   . Hypertension   . Insomnia   . Migraines   . Osteoarthritis   . Parasomnia   . Wears partial dentures    lower    Patient Active Problem List   Diagnosis Date Noted  . AMS (altered mental status) 09/16/2019  . GI bleed 02/23/2018  . Substance abuse ([place]) 03/13/2017  . Aortic atherosclerosis ([place]) 12/01/2016  . Hepatic steatosis 12/01/2016  . Insomnia 12/01/2016  . Anxiety and depression 12/01/2016  . Liver cirrhosis ([place]) 11/16/2016  . Nicotine dependence 07/11/2013  . DJD (degenerative joint disease) 07/11/2013  . DDD (degenerative disc disease) 07/11/2013  . Anemia 07/11/2013  . Narcotic abuse ([place]) 07/11/2013  . CMC arthritis, thumb, degenerative 06/23/2013  . Alcohol abuse 09/02/2010  . Benign essential hypertension 06/09/2005    Past Surgical History:  Procedure Laterality Date  . ANKLE ARTHROSCOPY WITH RECONSTRUCTION  07/2017  . BIOPSY   02/24/2018   Procedure: BIOPSY;  Surgeon: Jackquline Denmark, MD;  Location: [facility];  Service: Endoscopy;;  . CARPAL TUNNEL RELEASE  03/17/2012   Procedure: CARPAL TUNNEL RELEASE;  Surgeon: Roseanne Kaufman, MD;  Location: [place];  Service: Orthopedics;  Laterality: Bilateral;  left limited open carpal tunnel release. right carpal tunnel injection with 1cc of celestone and 2cc of marcaine.25%  . CARPAL TUNNEL RELEASE  06/30/2012   Procedure: CARPAL TUNNEL RELEASE;  Surgeon: Roseanne Kaufman, MD;  Location: [place];  Service: Orthopedics;  Laterality: Right;  RIGHT LIMITED OPEN CARPAL TUNNEL RELEASE  . CARPOMETACARPAL ([facility]) FUSION OF THUMB Left 06/23/2013   Procedure: LEFT CARPOMETACARPEL ([place]) FUSION OF THUMB WITH AUTOGRAFT FROM RADIUS AND REPAIR AS NECESSARY;  Surgeon: Roseanne Kaufman, MD;  Location: [place];  Service: Orthopedics;  Laterality: Left;  . COLONOSCOPY  2014  . ESOPHAGOGASTRODUODENOSCOPY  05/2017  . ESOPHAGOGASTRODUODENOSCOPY (EGD) WITH PROPOFOL N/A 02/24/2018   Procedure: ESOPHAGOGASTRODUODENOSCOPY (EGD) WITH PROPOFOL;  Surgeon: Jackquline Denmark, MD;  Location: [facility];  Service: Endoscopy;  Laterality: N/A;  . HARDWARE REMOVAL Left 07/10/2013   Procedure: HARDWARE REMOVAL;  Surgeon: Roseanne Kaufman, MD;  Location: [facility];  Service: Orthopedics;  Laterality: Left;  . INCISION AND DRAINAGE OF WOUND Left 07/10/2013   Procedure: IRRIGATION AND DEBRIDEMENT WOUND left wrist  ;  Surgeon: Roseanne Kaufman, MD;  Location: [facility];  Service: Orthopedics;  Laterality: Left;  . LAPAROSCOPIC VAGINAL HYSTERECTOMY  02/24/2010  . LAPAROSCOPY  06/2010   for adhesions  . LEEP     x 2  . LUMBAR DISC SURGERY  03/23/2003   left L5-S1 discectomy with microdissection  . LUMBAR [place] SURGERY  05/23/2003   redo discectomy L5-S1 with microdissection     OB History   No obstetric history on file.     Family History  Problem Relation Age of Onset  . Diabetes Mother   .  Hypertension Mother   . Colon polyps Sister   . Hypertension Sister   . Diabetes Sister   . Lung cancer Maternal Grandmother   . Colon cancer Neg Hx   . Esophageal cancer Neg Hx   . Pancreatic cancer Neg Hx   . Stomach cancer Neg Hx   . Liver disease Neg Hx     Social History   Tobacco Use  . Smoking status: Former Research scientist (life sciences)  . Smokeless tobacco: Never Used  Substance Use Topics  . Alcohol use: Yes    Comment: 2 [employer]ikes hard drinks; now has only occasional as of 11/18/16   . Drug use: No    Home Medications Prior to Admission medications   Medication Sig Start Date End Date Taking? Authorizing Provider  albuterol (VENTOLIN HFA) 108 (90 Base) MCG/ACT inhaler Inhale 1 puff into the lungs every 6 (six) hours as needed for wheezing or shortness of breath.    [provider]  alendronate (FOSAMAX) 70 MG tablet Take with a full glass of water on an empty stomach. STOP FOR NOW 02/23/2018 Patient not taking: Reported on 03/08/2018 02/23/18   [name], MD  aspirin EC 325 MG tablet Take 325 mg by mouth daily as needed (pain).    [provider]  busPIRone (BUSPAR) 15 MG tablet TAKE 1 TABLET(15 MG) BY MOUTH TWICE DAILY 01/17/18   [name], PA-C  busPIRone (BUSPAR) 15 MG tablet Take 15 mg by mouth 2 (two) times daily. 07/08/19   [provider]  clotrimazole-betamethasone (LOTRISONE) cream Apply 1 application topically 2 (two) times daily. 03/08/18   [name], PA-C  cyclobenzaprine (FLEXERIL) 10 MG tablet Take 1 tablet (10 mg total) by mouth 3 (three) times daily as needed for muscle spasms. 05/20/18   [name], PA-C  FLUoxetine (PROZAC) 40 MG capsule Take 1 capsule (40 mg total) by mouth daily. 04/12/18   [name], PA-C  FLUoxetine (PROZAC) 40 MG capsule Take 40 mg by mouth daily. 08/21/19   [provider]  fluticasone (FLONASE) 50 MCG/ACT nasal spray Place 2 sprays into both nostrils daily. 03/10/18   [name], PA-C    fluticasone [facility]) 50 MCG/ACT nasal spray Place 2 sprays into both nostrils daily as needed for allergies or rhinitis.  06/26/19   [provider]  folic acid (FOLVITE) 1 MG tablet TAKE 1 TABLET(1 MG) BY MOUTH DAILY 06/01/18   [name], PA-C  folic acid (FOLVITE) 1 MG tablet Take 1 tablet (1 mg total) by mouth daily. 09/20/19   Mitzi Hansen, MD  gabapentin (NEURONTIN) 100 MG capsule TAKE 1 CAPSULE EVERY MORNING, NOON AND AT NIGHT WITH THE 300 MG CAPSULE 04/26/18   [name], PA-C  gabapentin (NEURONTIN) 300 MG capsule TAKE 2 CAPSULES BY MOUTH EVERY MORNING AND NOON THEN 3 CAPSULES EVERY EVENING 06/29/18   [name], PA-C  hydrochlorothiazide (HYDRODIURIL) 25 MG tablet Take 25 mg by mouth  daily.    [provider]  ibuprofen (ADVIL) 800 MG tablet Take 800 mg by mouth every 8 (eight) hours as needed (pain).    [provider]  lactulose ([place]) 10 GM/15ML solution TAKE 30ML BY MOUTH EVERY DAY 06/10/18   [name], MD  lactulose (CHRONULAC) 10 GM/15ML solution Take 20 g by mouth daily. 09/11/19   [provider]  Multiple Vitamin (MULTIVITAMIN WITH MINERALS) TABS tablet Take 1 tablet by mouth daily.    [provider]  nystatin (MYCOSTATIN) 100000 UNIT/ML suspension Take 5 mLs by mouth 4 (four) times daily as needed (thrush from inhalers (swish and spit)).    [provider]  ondansetron (ZOFRAN-ODT) 4 MG disintegrating tablet DISSOLVE 1 TABLET(4 MG) ON THE TONGUE EVERY 8 HOURS AS NEEDED FOR NAUSEA OR VOMITING 04/13/18   [name], MD  ondansetron (ZOFRAN-ODT) 4 MG disintegrating tablet Take 4 mg by mouth every 8 (eight) hours as needed for nausea or vomiting.  07/25/19   [provider]  pantoprazole (PROTONIX) 40 MG tablet TAKE 1 TABLET(40 MG) BY MOUTH DAILY BEFORE BREAKFAST 08/15/18   [name], MD  pantoprazole (PROTONIX) 40 MG tablet Take 40 mg by mouth daily. 09/11/19   [provider]  polyvinyl alcohol (ARTIFICIAL TEARS) 1.4 % ophthalmic solution Place 1 drop into both eyes daily.    [provider]  potassium chloride SA (K-DUR,KLOR-CON) 20 MEQ tablet Take 20 mEq by mouth 2 (two) times daily.    [provider]  propranolol (INDERAL) 20 MG tablet TAKE 1 TABLET(20 MG) BY MOUTH TWICE DAILY 06/01/18   [name], PA-C  propranolol (INDERAL) 20 MG tablet Take 20 mg by mouth 2 (two) times daily. 07/17/19   [provider]  thiamine 100 MG tablet Take 1 tablet (100 mg total) by mouth daily. 09/20/19   Mitzi Hansen, MD  VENTOLIN HFA 108 (90 Base) MCG/ACT inhaler INHALE 2 PUFFS INTO THE LUNGS EVERY 6 HOURS AS NEEDED FOR WHEEZING OR SHORTNESS OF BREATH 05/19/18   [name], PA-C  Vitamin D, Ergocalciferol, (DRISDOL) 1.25 MG (50000 UNIT) CAPS capsule Take 50,000 Units by mouth every Monday. 05/01/19   [provider]    Allergies    Patient has no known allergies.  Review of Systems   Review of Systems  Unable to perform ROS: Intubated    Physical Exam Updated Vital Signs BP 125/85   Pulse 79   Temp (!) 90.1 F (32.3 C)   Resp 16   Ht '5\' 5"'$  (1.651 m)   SpO2 100%   BMI 22.80 kg/m   Physical Exam Vitals and nursing note reviewed.  Constitutional:      Appearance: She is ill-appearing.  HENT:     Mouth/Throat:     Mouth: Mucous membranes are moist.  Eyes:     Pupils: Pupils are equal, round, and reactive to light.  Cardiovascular:     Rate and Rhythm: Normal rate.     Pulses: Normal pulses.     Heart sounds: No murmur.  Pulmonary:     Effort: Respiratory distress present.     Breath sounds: Rhonchi present.  Abdominal:     General: Abdomen is flat. There is no distension.  Neurological:     Mental Status: She is unresponsive.     GCS: GCS eye subscore is 1. GCS verbal subscore is 1. GCS motor subscore is 1.     Comments: GCS 3 on arrival.  Patient  quickly unresponsive to any pain or stimulation.  No  external trauma seen on exam.  Lungs are coarse bilaterally.  Cold to the touch.     ED Results / Procedures / Treatments   Labs (all labs ordered are listed, but only abnormal results are displayed) Labs Reviewed  LACTIC ACID, PLASMA - Abnormal; Notable for the following components:      Result Value   Lactic Acid, Venous 9.3 (*)    All other components within normal limits  LACTIC ACID, PLASMA - Abnormal; Notable for the following components:   Lactic Acid, Venous 3.8 (*)    All other components within normal limits  COMPREHENSIVE METABOLIC PANEL - Abnormal; Notable for the following components:   Sodium 134 (*)    Potassium 5.4 (*)    CO2 12 (*)    Glucose, Bld 136 (*)    Creatinine, Ser 1.67 (*)    Calcium 8.2 (*)    AST 318 (*)    ALT 252 (*)    Alkaline Phosphatase 173 (*)    GFR calc non Af Amer 35 (*)    GFR calc Af Amer 40 (*)    Anion gap 19 (*)    All other components within normal limits  CBC WITH DIFFERENTIAL/PLATELET - Abnormal; Notable for the following components:   WBC 36.4 (*)    HCT 46.9 (*)    MCHC 27.5 (*)    RDW 22.5 (*)    Neutro Abs 28.0 (*)    Monocytes Absolute 1.9 (*)    Basophils Absolute 0.4 (*)    Abs Immature Granulocytes 2.78 (*)    All other components within normal limits  APTT - Abnormal; Notable for the following components:   aPTT 64 (*)    All other components within normal limits  PROTIME-INR - Abnormal; Notable for the following components:   Prothrombin Time 19.5 (*)    INR 1.7 (*)    All other components within normal limits  URINALYSIS, ROUTINE W REFLEX MICROSCOPIC - Abnormal; Notable for the following components:   Hgb urine dipstick MODERATE (*)    Protein, ur 100 (*)    Bacteria, UA RARE (*)    All other components within normal limits  BRAIN NATRIURETIC PEPTIDE - Abnormal; Notable for the following components:   B Natriuretic Peptide 235.7 (*)    All other components within normal limits  SALICYLATE LEVEL -  Abnormal; Notable for the following components:   Salicylate Lvl <4.6 (*)    All other components within normal limits  RAPID URINE DRUG SCREEN, HOSP PERFORMED - Abnormal; Notable for the following components:   Opiates POSITIVE (*)    All other components within normal limits  TSH - Abnormal; Notable for the following components:   TSH 8.099 (*)    All other components within normal limits  AMMONIA - Abnormal; Notable for the following components:   Ammonia 122 (*)    All other components within normal limits  CBC - Abnormal; Notable for the following components:   WBC 27.8 (*)    HCT 46.9 (*)    MCHC 29.2 (*)    RDW 22.5 (*)    All other components within normal limits  GLUCOSE, CAPILLARY - Abnormal; Notable for the following components:   Glucose-Capillary 114 (*)    All other components within normal limits  POCT I-STAT 7, (LYTES, BLD GAS, ICA,H+H) - Abnormal; Notable for the following components:   pH, Arterial 7.027 (*)    pCO2 arterial  60.4 (*)    pO2, Arterial 119 (*)    Bicarbonate 15.9 (*)    TCO2 18 (*)    Acid-base deficit 16.0 (*)    Sodium 133 (*)    Potassium 5.5 (*)    Calcium, Ion 1.03 (*)    All other components within normal limits  TROPONIN I (HIGH SENSITIVITY) - Abnormal; Notable for the following components:   Troponin I (High Sensitivity) 48 (*)    All other components within normal limits  TROPONIN I (HIGH SENSITIVITY) - Abnormal; Notable for the following components:   Troponin I (High Sensitivity) 84 (*)    All other components within normal limits  RESPIRATORY PANEL BY RT PCR (FLU A&B, COVID)  CULTURE, BLOOD (ROUTINE X 2)  CULTURE, BLOOD (ROUTINE X 2)  URINE CULTURE  CULTURE, RESPIRATORY  MRSA PCR SCREENING  ETHANOL  T4, FREE  BASIC METABOLIC PANEL  BLOOD GAS, ARTERIAL  BASIC METABOLIC PANEL  I-STAT BETA HCG BLOOD, ED ([facility], [facility], [facility] ONLY)  I-STAT ARTERIAL BLOOD GAS, ED  TYPE AND SCREEN  ABO/RH    EKG EKG  Interpretation  Date/Time:  Friday November 10 2019 10:05:48 EDT Ventricular Rate:  77 PR Interval:    QRS Duration: 105 QT Interval:  470 QTC Calculation: 532 R Axis:   110 Text Interpretation: Right and left arm electrode reversal, interpretation assumes no reversal Sinus rhythm Probable lateral infarct, age indeterminate Prolonged QT interval When compared to prior, slightly longer QTC but similar to 2014 ECG. No STEMI Confirmed by Antony Blackbird [phone]) on 11/10/2019 11:01:46 AM   Radiology DG Chest Port 1 View  Result Date: 11/10/2019 CLINICAL DATA:  CPR  ETT placement EXAM: PORTABLE CHEST 1 VIEW COMPARISON:  Chest radiograph 09/16/2019 FINDINGS: Interval intubation with endotracheal tube tip between the thoracic inlet and carina. There are extensive bilateral right greater than left interstitial and airspace opacities. No pneumothorax or large pleural effusion. Probable lower right rib fracture. IMPRESSION: 1. Interval intubation with endotracheal tube tip in appropriate position. 2. Extensive bilateral right greater than left interstitial and airspace opacities, could represent pulmonary edema or aspiration. 3. Probable lower right rib fracture. Electronically Signed   By: Audie Pinto M.D.   On: 11/10/2019 10:27    Procedures Procedure Name: Intubation Date/Time: 11/10/2019 12:37 PM Performed by: Courtney Paris, MD Pre-anesthesia Checklist: Patient identified, Patient being monitored, Emergency Drugs available, Timeout performed and Suction available Oxygen Delivery Method: Non-rebreather mask Preoxygenation: Pre-oxygenation with 100% oxygen Induction Type: Rapid sequence Ventilation: Mask ventilation without difficulty Laryngoscope Size: Glidescope Grade View: Grade I Tube size: 7.5 mm Number of attempts: 1 Placement Confirmation: ETT inserted through vocal cords under direct vision,  CO2 detector and Breath sounds checked- equal and bilateral Secured at: 21 cm Tube  secured with: ETT holder Dental Injury: Teeth and Oropharynx as per pre-operative assessment       (including critical care time)  CRITICAL CARE Performed by: Gwenyth Allegra Jolea Dolle Total critical care time: 50 minutes Critical care time was exclusive of separately billable procedures and treating other patients. Critical care was necessary to treat or prevent imminent or life-threatening deterioration. Critical care was time spent personally by me on the following activities: development of treatment plan with patient and/or surrogate as well as nursing, discussions with consultants, evaluation of patient's response to treatment, examination of patient, obtaining history from patient or surrogate, ordering and performing treatments and interventions, ordering and review of laboratory studies, ordering and review of radiographic studies, pulse oximetry and re-evaluation  of patient's condition.   Medications Ordered in ED Medications  docusate sodium (COLACE) capsule 100 mg (has no administration in time range)  polyethylene glycol (MIRALAX / GLYCOLAX) packet 17 g (has no administration in time range)  heparin injection 5,000 Units (5,000 Units Subcutaneous Given 11/10/19 1404)  pantoprazole (PROTONIX) injection 40 mg (has no administration in time range)  lactated ringers infusion ( Intravenous Rate/Dose Verify 11/10/19 1400)  aspirin chewable tablet 324 mg (has no administration in time range)    Or  aspirin suppository 300 mg (has no administration in time range)  lactulose (CHRONULAC) 10 GM/15ML solution 30 g (30 g Per Tube Given 11/10/19 1410)  fentaNYL (SUBLIMAZE) injection 25 mcg (has no administration in time range)  fentaNYL (SUBLIMAZE) injection 25-100 mcg (has no administration in time range)  ampicillin-sulbactam (UNASYN) 1.5 g in sodium chloride 0.9 % 100 mL IVPB (has no administration in time range)  chlorhexidine gluconate (MEDLINE KIT) (PERIDEX) 0.12 % solution 15 mL (has no  administration in time range)  MEDLINE mouth rinse (has no administration in time range)  polyethylene glycol (MIRALAX / GLYCOLAX) packet 17 g (17 g Per Tube Given 11/10/19 1546)  docusate (COLACE) 50 MG/5ML liquid 100 mg (100 mg Per Tube Given 11/10/19 1546)  magnesium sulfate IVPB 2 g 50 mL (0 g Intravenous Stopped 11/10/19 1139)  ceFEPIme (MAXIPIME) 2 g in sodium chloride 0.9 % 100 mL IVPB (0 g Intravenous Stopped 11/10/19 1127)  metroNIDAZOLE (FLAGYL) IVPB 500 mg ( Intravenous Stopped 11/10/19 1158)  sodium chloride 0.9 % bolus 1,000 mL (0 mLs Intravenous Stopped 11/10/19 1049)    And  sodium chloride 0.9 % bolus 1,000 mL (0 mLs Intravenous Stopped 11/10/19 1155)  etomidate (AMIDATE) injection (20 mg Intravenous Given 11/10/19 0947)  rocuronium (ZEMURON) injection (90 mg Intravenous Given 11/10/19 0947)  vancomycin (VANCOREADY) IVPB 1250 mg/250 mL (0 mg Intravenous Stopped 11/10/19 1543)  phytonadione (VITAMIN K) tablet 10 mg (10 mg Per Tube Given 11/10/19 1546)    ED Course  I have reviewed the triage vital signs and the nursing notes.  Pertinent labs & imaging results that were available during my care of the patient were reviewed by me and considered in my medical decision making (see chart for details).    MDM Rules/Calculators/A&P                      ASHMI BLAS is a 54 y.o. female with a past medical history significant for cirrhosis, prior GI bleed, anxiety/depression, aortic atherosclerosis, and migraines who presents as a post arrest altered mental status.  According to EMS, patient was last seen normal at 9 PM last night when her husband went to bed.  This morning, patient would not wake up and husband could not feel pulse we started CPR.  EMS reports that they did approximately 30 minutes of CPR including 6 doses of epinephrine and had intermittent ROSC.  Patient had achieved ROSC prior to arrival to the emergency department.  A King airway was in place and patient had a GCS of  3.  EMS reports that their initial rhythm was asystole and they never had a shockable rhythm as she converted to PEA prior to ROSC.  Upon arrival, patient is still unresponsive with a GCS of 3.  Pupils are fixed and 4 mm.  EMS reports that they gave Narcan given a history of possible opioid abuse but did not respond.  As patient is not protecting airway, decision made to intubate  and switch out the [facility] airway.  Patient was intubated without difficulty.  Breath sounds are equal bilaterally but slightly rhonchorous.  Blood pressure was in the 120s to 140s initially and patient was cool to the touch.  Temperature in the low 90s.  Heart rate in the 70s to 90s initially.  Due to the patient's chest x-ray showing concern for aspiration or pneumonia and good placement of the tube, given her cold temperature and altered mental status, will make her a code sepsis.  Covid test was sent.  I spoke with the patient's husband who reports that patient was acting normal the last few days.  Reports she was feeling well last night before going to bed and had no other complaints.  He does say that she is on Seroquel and he thinks this may have caused her to have altered mental status.  Patient will have CT head, labs, and critical care will be called for admission.  Critical care will admit for further management of altered mental status and post arrest requiring intubation.  EKG did not show STEMI and initial labs are concerning for multiple abnormalities including acidosis, lactic acidosis, Covid negative, flu negative, elevated LFT and alk phos, and significant leukocytosis.  Critical care will admit for further management.   Final Clinical Impression(s) / ED Diagnoses Final diagnoses:  Cardiac arrest ([place])  Altered mental status, unspecified altered mental status type    Clinical Impression: 1. Cardiac arrest ([place])   2. Altered mental status, unspecified altered mental status type     Disposition:  Admit  This note was prepared with assistance of Dragon voice recognition software. Occasional wrong-word or sound-a-like substitutions may have occurred due to the inherent limitations of voice recognition software.      Capucine Tryon, Gwenyth Allegra, MD 11/10/19 1556

## 2019-11-10 NOTE — Procedures (Signed)
Patient Name: Amy Villa  MRN: [id number]  Epilepsy Attending: Lora Havens  Referring Physician/Provider: Salvadore Dom, NP Date: 11/10/2019 Duration: 24.14 mins  Patient history: 54 year old female status post cardiac arrest.  EEG evaluate for seizures.  Level of alertness: Comatose  AEDs during EEG study: None  Technical aspects: This EEG study was done with scalp electrodes positioned according to the 10-20 International system of electrode placement. Electrical activity was acquired at a sampling rate of 500Hz  and reviewed with a high frequency filter of 70Hz  and a low frequency filter of 1Hz . EEG data were recorded continuously and digitally stored.   Description: EEG showed continuous generalized  3 to 5 Hz theta slowing. Reactivity was not tested during this study. Photic driving was not seen during photic stimulation. Hyperventilation was not performed.  Abnormality -Continuous slow, generalized  IMPRESSION: This study is suggestive of profound diffuse encephalopathy, non specific to encephalopathy. No seizures or epileptiform discharges were seen throughout the recording.  Xitlalli Newhard Barbra Sarks

## 2019-11-10 NOTE — H&P (Addendum)
NAME:  Amy Villa, MRN:  [id number], DOB:  December 27, 1965, LOS: 0 ADMISSION DATE:  11/10/2019, CONSULTATION DATE:  4/23 REFERRING MD:  Tegeler, CHIEF COMPLAINT:  Cardiac arrest  Brief History   31 yof w/ hx as mentioned below. Was in [place] until am 4/23 when she was found initially altered about 0530 then about 2 hrs later found in full cardiac arrest. Time to ROSC minimally 30 minutes. GCS 3 on arrival. PCCM asked to admit   History of present illness   54 year old female with history as mentioned below, followed at [facility] family medicine.  Most recently seen in office for medication management, requiring Seroquel refill while searching for new psychiatrist. Was in normal state of health on 4/22. Husband sleeps in another room. Said he saw her light on about 2am (a common occurrence when she can't sleep), he checked on her about 530 am, at that time she was altered (slow to respond, sleepy but would sit up in bed), he said she had done this before (eluding to self medicating with her meds) and left the room. He again went to check on her he estimated 2 hrs later , Husband was not able to wake her up this morning, found her pulseless and not breathing.  He initiated CPR, EMS called, asystole on arrival, time to return of spontaneous circulation estimated at over 30 minutes.   Past Medical History  Former tobacco abuse, , Anxiety/depression, insomnia alcoholic related cirrhosis of the liver with esophageal varices, COPD, gastroesophageal reflux disease without esophagitis, hypertension, iron deficiency anemia  Significant Hospital Events   4/23 admitted  Consults:    Procedures:  oett 4/23>>>  Significant Diagnostic Tests:  CT head 4/23>>> EEG 4/23>>>  Micro Data:  Respiratory culture 4/23 Respiratory panel 4/23 Antimicrobials:  unasyn 4/23  Interim history/subjective:  GCS is 3 Objective   Blood pressure 121/83, pulse 78, temperature (Abnormal) 90.5 F (32.5 C),  temperature source Rectal, resp. rate 16, height 5\' 5"  (1.651 m), SpO2 98 %.       No intake or output data in the 24 hours ending 11/10/19 1026 There were no vitals filed for this visit.  Examination: General: 89 yowf unresponsive currently on full support HENT: normal cephalic, Pupils equal not responsive and fixed Lungs: diffuse rales and prolonged exp wheeze. Right worse than left Cardiovascular: RRR w/out MRG Abdomen: soft not tender  Extremities: cool, pulses are weak mottled  Neuro: GCS 3, no cough, gag blink to threat. Pupils fixed and dilated GU: clear yellow urine   Resolved Hospital Problem list     Assessment & Plan:   Cardiopulmonary arrest, asystole initial rhythm.  Etiology unclear Plan Cont supportive care 12 lead ekg Consider echo pending CT results  Acute hypoxic respiratory failure following cardiopulmonary arrest, complicated by Aspiration pneumonia and probable right lower rib fracture as a consequence of CPR We will chest x-ray demonstrates severe right greater than left interstitial airspace disease Plan Full vent support Sputum culture F/u abg VAP bundle Unasyn PAD protocol RASS goal 0 (does not appear to need sedation currently)  Acute metabolic encephalopathy, likely anoxic encephalopathy following prolonged cardiac arrest Hyperammonemia raising concern for hepatic encephalopathy as well but w/ 30 minutes down time anoxic more likely  Plan Awaiting CT head Depending on results consider EEG Not candidate for hypothermia protocol  Add lactulose  Holding buspar, flexeril, prozac, and neurontin for now   Hypothermia s/p arrest Plan Passive warming  Supportive care  Acute anion gap metabolic acidosis  w/ Lactic acidosis s/p CPR Plan Cont supportive care  AKI w/ hyperkalemia Plan Cont IVFs Hold K replacement Serial chemistries  Leukocytosis in setting of aspiration and reactive component as well Plan abx Trend cbc  History of  alcohol related liver cirrhosis without ascites but with history of esophageal varices Plan Am LFTs  Mild coagulopathy Plan Vit K Vt  History of anxiety and depression Plan Holding seroquel. buspar and prozac for now)  History of gastroesophageal reflux disease Plan PPI   History of iron deficiency anemia Plan Trend cbc  Best practice:  Diet: NPO Pain/Anxiety/Delirium protocol (if indicated): 4/23 VAP protocol (if indicated): 4/23 DVT prophylaxis: Ball heparin GI prophylaxis: PPI Glucose control: na Mobility: br Code Status: dnr if arrests Family Communication: husband and son updated Disposition: ICU  Labs   CBC: No results for input(s): WBC, NEUTROABS, HGB, HCT, MCV, PLT in the last 168 hours.  Basic Metabolic Panel: No results for input(s): NA, K, CL, CO2, GLUCOSE, BUN, CREATININE, CALCIUM, MG, PHOS in the last 168 hours. GFR: CrCl cannot be calculated (Patient's most recent lab result is older than the maximum 21 days allowed.). No results for input(s): PROCALCITON, WBC, LATICACIDVEN in the last 168 hours.  Liver Function Tests: No results for input(s): AST, ALT, ALKPHOS, BILITOT, PROT, ALBUMIN in the last 168 hours. No results for input(s): LIPASE, AMYLASE in the last 168 hours. No results for input(s): AMMONIA in the last 168 hours.  ABG    Component Value Date/Time   TCO2 22 09/16/2019 0650     Coagulation Profile: No results for input(s): INR, PROTIME in the last 168 hours.  Cardiac Enzymes: No results for input(s): CKTOTAL, CKMB, CKMBINDEX, TROPONINI in the last 168 hours.  HbA1C: Hgb A1c MFr Bld  Date/Time Value Ref Range Status  11/30/2016 03:13 PM 6.0 4.6 - 6.5 % Final    Comment:    Glycemic Control Guidelines for People with Diabetes:Non Diabetic:  <6%Goal of Therapy: <7%Additional Action Suggested:  >8%   09/12/2008 01:25 AM  4.6 - 6.1 % Final   5.8 (NOTE)   The ADA recommends the following therapeutic goal for glycemic   control  related to Hgb A1C measurement:   Goal of Therapy:   < 7.0% Hgb A1C   Reference: American Diabetes Association: Clinical Practice   Recommendations 2008, Diabetes Care,  2008, 31:(Suppl 1).    CBG: No results for input(s): GLUCAP in the last 168 hours.  Review of Systems:   Not able   Past Medical History  She,  has a past medical history of Alcoholic hepatitis, Aortic atherosclerosis ([place]), Arthritis, Carpal tunnel syndrome on both sides (02/2012), Cigarette nicotine dependence, Contact lens/glasses fitting, Dental crowns present, Elevated LFTs, Fatty liver, Fluid retention, History of MRSA infection, Hypertension, Insomnia, Migraines, Osteoarthritis, Parasomnia, and Wears partial dentures.   Surgical History    Past Surgical History:  Procedure Laterality Date  . ANKLE ARTHROSCOPY WITH RECONSTRUCTION  07/2017  . BIOPSY  02/24/2018   Procedure: BIOPSY;  Surgeon: Jackquline Denmark, MD;  Location: [facility];  Service: Endoscopy;;  . CARPAL TUNNEL RELEASE  03/17/2012   Procedure: CARPAL TUNNEL RELEASE;  Surgeon: Roseanne Kaufman, MD;  Location: [place];  Service: Orthopedics;  Laterality: Bilateral;  left limited open carpal tunnel release. right carpal tunnel injection with 1cc of celestone and 2cc of marcaine.25%  . CARPAL TUNNEL RELEASE  06/30/2012   Procedure: CARPAL TUNNEL RELEASE;  Surgeon: Roseanne Kaufman, MD;  Location: [place];  Service: Orthopedics;  Laterality: Right;  RIGHT LIMITED OPEN CARPAL TUNNEL RELEASE  . CARPOMETACARPAL ([facility]) FUSION OF THUMB Left 06/23/2013   Procedure: LEFT CARPOMETACARPEL ([place]) FUSION OF THUMB WITH AUTOGRAFT FROM RADIUS AND REPAIR AS NECESSARY;  Surgeon: Roseanne Kaufman, MD;  Location: [place];  Service: Orthopedics;  Laterality: Left;  . COLONOSCOPY  2014  . ESOPHAGOGASTRODUODENOSCOPY  05/2017  . ESOPHAGOGASTRODUODENOSCOPY (EGD) WITH PROPOFOL N/A 02/24/2018   Procedure: ESOPHAGOGASTRODUODENOSCOPY (EGD) WITH PROPOFOL;  Surgeon:  Jackquline Denmark, MD;  [facility];  Service: Endoscopy;  Laterality: N/A;  . HARDWARE REMOVAL Left 07/10/2013   Procedure: HARDWARE REMOVAL;  Surgeon: Roseanne Kaufman, MD;  [facility];  Service: Orthopedics;  Laterality: Left;  . INCISION AND DRAINAGE OF WOUND Left 07/10/2013   Procedure: IRRIGATION AND DEBRIDEMENT WOUND left wrist  ;  Surgeon: Roseanne Kaufman, MD;  Location: [facility];  Service: Orthopedics;  Laterality: Left;  . LAPAROSCOPIC VAGINAL HYSTERECTOMY  02/24/2010  . LAPAROSCOPY  06/2010   for adhesions  . LEEP     x 2  . LUMBAR DISC SURGERY  03/23/2003   left L5-S1 discectomy with microdissection  . LUMBAR [place] SURGERY  05/23/2003   redo discectomy L5-S1 with microdissection     Social History   reports that she has quit smoking. She has never used smokeless tobacco. She reports current alcohol use. She reports that she does not use drugs.   Family History   Her family history includes Colon polyps in her sister; Diabetes in her mother and sister; Hypertension in her mother and sister; Lung cancer in her maternal grandmother. There is no history of Colon cancer, Esophageal cancer, Pancreatic cancer, Stomach cancer, or Liver disease.   Allergies No Known Allergies   Home Medications  Prior to Admission medications   Medication Sig Start Date End Date Taking? Authorizing Provider  albuterol (VENTOLIN HFA) 108 (90 Base) MCG/ACT inhaler Inhale 1 puff into the lungs every 6 (six) hours as needed for wheezing or shortness of breath.    [provider]  alendronate (FOSAMAX) 70 MG tablet Take with a full glass of water on an empty stomach. STOP FOR NOW 02/23/2018 Patient not taking: Reported on 03/08/2018 02/23/18   [name], MD  aspirin EC 325 MG tablet Take 325 mg by mouth daily as needed (pain).    [provider]  busPIRone (BUSPAR) 15 MG tablet TAKE 1 TABLET(15 MG) BY MOUTH TWICE DAILY 01/17/18   [name], PA-C  busPIRone (BUSPAR) 15 MG  tablet Take 15 mg by mouth 2 (two) times daily. 07/08/19   [provider]  clotrimazole-betamethasone (LOTRISONE) cream Apply 1 application topically 2 (two) times daily. 03/08/18   [name], PA-C  cyclobenzaprine (FLEXERIL) 10 MG tablet Take 1 tablet (10 mg total) by mouth 3 (three) times daily as needed for muscle spasms. 05/20/18   [name], PA-C  FLUoxetine (PROZAC) 40 MG capsule Take 1 capsule (40 mg total) by mouth daily. 04/12/18   [name], PA-C  FLUoxetine (PROZAC) 40 MG capsule Take 40 mg by mouth daily. 08/21/19   [provider]  fluticasone (FLONASE) 50 MCG/ACT nasal spray Place 2 sprays into both nostrils daily. 03/10/18   [name], PA-C  fluticasone [facility]) 50 MCG/ACT nasal spray Place 2 sprays into both nostrils daily as needed for allergies or rhinitis.  06/26/19   [provider]  folic acid (FOLVITE) 1 MG tablet TAKE 1 TABLET(1 MG) BY MOUTH DAILY 06/01/18  [name], PA-C  folic acid (FOLVITE) 1 MG tablet Take 1 tablet (1 mg total) by mouth daily. 09/20/19   Mitzi Hansen, MD  gabapentin (NEURONTIN) 100 MG capsule TAKE 1 CAPSULE EVERY MORNING, NOON AND AT NIGHT WITH THE 300 MG CAPSULE 04/26/18   [name], PA-C  gabapentin (NEURONTIN) 300 MG capsule TAKE 2 CAPSULES BY MOUTH EVERY MORNING AND NOON THEN 3 CAPSULES EVERY EVENING 06/29/18   [name], PA-C  hydrochlorothiazide (HYDRODIURIL) 25 MG tablet Take 25 mg by mouth daily.    [provider]  ibuprofen (ADVIL) 800 MG tablet Take 800 mg by mouth every 8 (eight) hours as needed (pain).    [provider]  lactulose ([place]) 10 GM/15ML solution TAKE 30ML BY MOUTH EVERY DAY 06/10/18   [name], MD  lactulose (CHRONULAC) 10 GM/15ML solution Take 20 g by mouth daily. 09/11/19   [provider]  Multiple Vitamin (MULTIVITAMIN WITH MINERALS) TABS tablet Take 1 tablet by mouth daily.    [provider]    nystatin (MYCOSTATIN) 100000 UNIT/ML suspension Take 5 mLs by mouth 4 (four) times daily as needed (thrush from inhalers (swish and spit)).    [provider]  ondansetron (ZOFRAN-ODT) 4 MG disintegrating tablet DISSOLVE 1 TABLET(4 MG) ON THE TONGUE EVERY 8 HOURS AS NEEDED FOR NAUSEA OR VOMITING 04/13/18   [name], MD  ondansetron (ZOFRAN-ODT) 4 MG disintegrating tablet Take 4 mg by mouth every 8 (eight) hours as needed for nausea or vomiting.  07/25/19   [provider]  pantoprazole (PROTONIX) 40 MG tablet TAKE 1 TABLET(40 MG) BY MOUTH DAILY BEFORE BREAKFAST 08/15/18   [name], MD  pantoprazole (PROTONIX) 40 MG tablet Take 40 mg by mouth daily. 09/11/19   [provider]  polyvinyl alcohol (ARTIFICIAL TEARS) 1.4 % ophthalmic solution Place 1 drop into both eyes daily.    [provider]  potassium chloride SA (K-DUR,KLOR-CON) 20 MEQ tablet Take 20 mEq by mouth 2 (two) times daily.    [provider]  propranolol (INDERAL) 20 MG tablet TAKE 1 TABLET(20 MG) BY MOUTH TWICE DAILY 06/01/18   [name], PA-C  propranolol (INDERAL) 20 MG tablet Take 20 mg by mouth 2 (two) times daily. 07/17/19   [provider]  thiamine 100 MG tablet Take 1 tablet (100 mg total) by mouth daily. 09/20/19   Mitzi Hansen, MD  VENTOLIN HFA 108 (90 Base) MCG/ACT inhaler INHALE 2 PUFFS INTO THE LUNGS EVERY 6 HOURS AS NEEDED FOR WHEEZING OR SHORTNESS OF BREATH 05/19/18   [name], PA-C  Vitamin D, Ergocalciferol, (DRISDOL) 1.25 MG (50000 UNIT) CAPS capsule Take 50,000 Units by mouth every Monday. 05/01/19   [provider]     Critical care time: 34 min    Erick Colace ACNP-BC [place] Pager # [phone] OR # [phone] if no answer

## 2019-11-10 NOTE — Hospital Course (Signed)
4/23 admitted s/p cardiac arrest. Estimated > 30 minutes to ROSC. Initial CT head neg. Hemodynamically stable.  Prob list on admit: acute encephalopathy s/p arrest w/ concern for anoxic injury. Element of hematic encephalopathy, acute resp failure c/b aspiration PNA, AKI, and lactic acidosis. All superimposed on underlying cirrhosis, depression. Etiology of arrest not clear on admit. Plan: supportive care, abx (unasyn) IVFs, lactulose for ammonia level of 122, ventilatory support. W/ plan to get EEG, repeat imaging in 48 hrs. Full dnr if arrests.

## 2019-11-10 NOTE — Progress Notes (Signed)
Responded to referral from Charge nurse to check in on patient and family. Patient is currently being intubated. Per nurse patient came in as CPR. Spoke with patient husband and other visitor.  Then later accompanied Doctor to talk with Husband. Provided ministry of presence ,emotional and spiritual support to family and staff. Chaplain available as needed.  Jaclynn Major, [place], [facility], Pager [phone]

## 2019-11-10 NOTE — Progress Notes (Signed)
I reviewed the CT head results with the patient's husband, we also again reviewed current physical findings, and current prognosis.  He did share with me that should we determine that this is been a irreversible brain injury where the patient could not return to normal functional status that she would not want to be supported long-term on mechanical ventilation or artificial means. At this time our plan is as follows:  #1 obtain EEG #2 treat infection/aspiration pneumonia #3 treat hepatic encephalopathy with lactulose #4 continue mechanical ventilation and IV hydration #5 DO NOT RESUSCITATE should she suffer cardiac arrest #6 repeat diagnostic imaging in 48 hours if no improvement in mental status  Anticipate if no improvement we would be working towards compassionate one-way extubation  Erick Colace ACNP-BC [place] Pager # [phone] OR # [phone] if no answer

## 2019-11-10 NOTE — Progress Notes (Signed)
EEG complete - results pending 

## 2019-11-10 NOTE — ED Triage Notes (Signed)
Pt arrives post arrest from home via [facility] pt was last seen by her husband at 5am and seemed altered, Later found wife to be unresponsive and pulseless around 0900 and called ems. Husband admistered CPR until ems arrived and first rhythm asystole then PEA after approximately 30 minutes of ems and just PTA to ER ems had return of pulses.( No shocks given, 6-7 epi, D50) Pt arrived to trauma bay with gcs of 3 with king airway being bagged. Pt has no movement, pupils dilated not reactive to light. Prepared to intubate on arrival.

## 2019-11-10 NOTE — Progress Notes (Signed)
Initial Nutrition Assessment  DOCUMENTATION CODES:   Not applicable  INTERVENTION:   -If aggressive care is warranted and pt unable to extubate, recommend:   Initiate Vital AF 1.2 @ 45 ml/hr via OGT (1080 ml/day)  Tube feeding regimen provides 1296 kcal (100% of needs), 81 grams of protein, and 876 ml of H2O.   NUTRITION DIAGNOSIS:   Inadequate oral intake related to inability to eat as evidenced by NPO status.  GOAL:   Patient will meet greater than or equal to 90% of their needs  MONITOR:   Vent status, Labs, Weight trends, Skin, I & O's  REASON FOR ASSESSMENT:   Ventilator    ASSESSMENT:   Pt is a 54 year old female with PMH of former tobacco abuse, Anxiety/depression, insomnia, alcoholic related cirrhosis of the liver with esophageal varices, COPD, gastroesophageal reflux disease without esophagitis, hypertension, iron deficiency anemia. Admitted s/p cardiac arrest.She was found altered and poorly responsive by her husband around 0530, then completely unresponsive and pulseless at 0730.  CPR was started, patient was in asystole when EMS arrived.  The estimated time to ROSC was over 30 minutes  Pt admitted s/p cardiac arrest.   Patient is currently intubated on ventilator support. OGT placement verified, currently clamped.  MV: 8.3 L/min Temp (24hrs), Avg:90.4 F (32.4 C), Min:90 F (32.2 C), Max:91.4 F (33 C)  Reviewed I/O's: +847 ml x 24 hours  UOP: 1.6 L x 24 hours  Per wt hx, wt has been stable over the past 2 years.   Per PCCM notes, pt with probably hypoxemic encephalopathy and brain injury with poor likelihood of meaningful recovery. Plan for EEG. If pt with irreversible injury, plan for compassionate one way extubation.   Medications reviewed colace, lactulose, and lactated ringers infusion @ 125 ml/hr.   Labs reviewed: CBGS: 114.   Diet Order:   Diet Order            Diet NPO time specified  Diet effective now              EDUCATION  NEEDS:   Not appropriate for education at this time  Skin:  Skin Assessment: Reviewed RN Assessment  Last BM:  11/10/19  Height:   Ht Readings from Last 1 Encounters:  11/10/19 5\' 5"  (1.651 m)    Weight:   Wt Readings from Last 1 Encounters:  09/16/19 62.1 kg    Ideal Body Weight:  56.8 kg  BMI:  Body mass index is 22.8 kg/m.  Estimated Nutritional Needs:   Kcal:  1231  Protein:  80-95 grams  Fluid:  > 1.2 L    Loistine Chance, RD, LDN, [place] Registered Dietitian II Certified Diabetes Care and Education Specialist Please refer to [facility] for RD and/or RD on-call/weekend/after hours pager

## 2019-11-10 NOTE — Progress Notes (Signed)
Pharmacy Antibiotic Note  Amy Villa is a 54 y.o. female admitted on 11/10/2019 with aspiration pneumonia.  Pharmacy has been consulted for unasyn dosing. SCr 1.67. WBC up to 36.4. Hypothermic. Severe lactic acidosis    Plan: -Start Unasyn 1.5 gm IV Q 6 hours  -Monitor CBC, renal fx, cultures and clinical progress   Height: 5\' 5"  (165.1 cm) IBW/kg (Calculated) : 57  Temp (24hrs), Avg:90.5 F (32.5 C), Min:90.5 F (32.5 C), Max:90.5 F (32.5 C)  No results for input(s): WBC, CREATININE, LATICACIDVEN, VANCOTROUGH, VANCOPEAK, VANCORANDOM, GENTTROUGH, GENTPEAK, GENTRANDOM, TOBRATROUGH, TOBRAPEAK, TOBRARND, AMIKACINPEAK, AMIKACINTROU, AMIKACIN in the last 168 hours.  CrCl cannot be calculated (Patient's most recent lab result is older than the maximum 21 days allowed.).    No Known Allergies    Thank you for allowing pharmacy to be a part of this patient's care.  Albertina Parr, PharmD., BCPS, BCCCP Clinical Pharmacist Clinical phone for 11/10/19 until 3:30pm: [phone] If after 3:30pm, please refer to [facility] for unit-specific pharmacist

## 2019-11-11 ENCOUNTER — Inpatient Hospital Stay (HOSPITAL_COMMUNITY): Payer: BLUE CROSS/BLUE SHIELD

## 2019-11-11 LAB — CBC
HCT: 35.6 % — ABNORMAL LOW (ref 36.0–46.0)
Hemoglobin: 10.7 g/dL — ABNORMAL LOW (ref 12.0–15.0)
MCH: 26.8 pg (ref 26.0–34.0)
MCHC: 30.1 g/dL (ref 30.0–36.0)
MCV: 89 fL (ref 80.0–100.0)
Platelets: 126 10*3/uL — ABNORMAL LOW (ref 150–400)
RBC: 4 MIL/uL (ref 3.87–5.11)
RDW: 22.2 % — ABNORMAL HIGH (ref 11.5–15.5)
WBC: 14.4 10*3/uL — ABNORMAL HIGH (ref 4.0–10.5)
nRBC: 0 % (ref 0.0–0.2)

## 2019-11-11 LAB — GLUCOSE, CAPILLARY
Glucose-Capillary: 112 mg/dL — ABNORMAL HIGH (ref 70–99)
Glucose-Capillary: 114 mg/dL — ABNORMAL HIGH (ref 70–99)
Glucose-Capillary: 101 mg/dL — ABNORMAL HIGH (ref 70–99)
Glucose-Capillary: 92 mg/dL (ref 70–99)
Glucose-Capillary: 99 mg/dL (ref 70–99)
Glucose-Capillary: 101 mg/dL — ABNORMAL HIGH (ref 70–99)

## 2019-11-11 LAB — PROTIME-INR
INR: 1.3 — ABNORMAL HIGH (ref 0.8–1.2)
Prothrombin Time: 16.5 seconds — ABNORMAL HIGH (ref 11.4–15.2)

## 2019-11-11 LAB — BASIC METABOLIC PANEL
Anion gap: 8 (ref 5–15)
Anion gap: 9 (ref 5–15)
BUN: 12 mg/dL (ref 6–20)
BUN: 8 mg/dL (ref 6–20)
CO2: 22 mmol/L (ref 22–32)
CO2: 24 mmol/L (ref 22–32)
Calcium: 8.3 mg/dL — ABNORMAL LOW (ref 8.9–10.3)
Calcium: 8.5 mg/dL — ABNORMAL LOW (ref 8.9–10.3)
Chloride: 108 mmol/L (ref 98–111)
Chloride: 110 mmol/L (ref 98–111)
Creatinine, Ser: 0.83 mg/dL (ref 0.44–1.00)
Creatinine, Ser: 0.49 mg/dL (ref 0.44–1.00)
GFR calc Af Amer: >60 mL/min (ref >60)
GFR calc Af Amer: >60 mL/min (ref >60)
GFR calc non Af Amer: >60 mL/min (ref >60)
GFR calc non Af Amer: >60 mL/min (ref >60)
Glucose, Bld: 133 mg/dL — ABNORMAL HIGH (ref 70–99)
Glucose, Bld: 99 mg/dL (ref 70–99)
Potassium: 3.9 mmol/L (ref 3.5–5.1)
Potassium: 3.4 mmol/L — ABNORMAL LOW (ref 3.5–5.1)
Sodium: 138 mmol/L (ref 135–145)
Sodium: 143 mmol/L (ref 135–145)

## 2019-11-11 LAB — POCT I-STAT 7, (LYTES, BLD GAS, ICA,H+H)
Acid-base deficit: 2 mmol/L (ref 0.0–2.0)
Bicarbonate: 24.8 mmol/L (ref 20.0–28.0)
Calcium, Ion: 1.23 mmol/L (ref 1.15–1.40)
HCT: 34 % — ABNORMAL LOW (ref 36.0–46.0)
Hemoglobin: 11.6 g/dL — ABNORMAL LOW (ref 12.0–15.0)
O2 Saturation: 95 %
Patient temperature: 98.6
Potassium: 3.8 mmol/L (ref 3.5–5.1)
Sodium: 140 mmol/L (ref 135–145)
TCO2: 26 mmol/L (ref 22–32)
pCO2 arterial: 51.3 mmHg — ABNORMAL HIGH (ref 32.0–48.0)
pH, Arterial: 7.292 — ABNORMAL LOW (ref 7.350–7.450)
pO2, Arterial: 85 mmHg (ref 83.0–108.0)

## 2019-11-11 LAB — URINE CULTURE: Culture: NO GROWTH

## 2019-11-11 LAB — HEPATIC FUNCTION PANEL
ALT: 294 U/L — ABNORMAL HIGH (ref 0–44)
AST: 341 U/L — ABNORMAL HIGH (ref 15–41)
Albumin: 3.1 g/dL — ABNORMAL LOW (ref 3.5–5.0)
Alkaline Phosphatase: 105 U/L (ref 38–126)
Bilirubin, Direct: 0.3 mg/dL — ABNORMAL HIGH (ref 0.0–0.2)
Indirect Bilirubin: 0.4 mg/dL (ref 0.3–0.9)
Total Bilirubin: 0.7 mg/dL (ref 0.3–1.2)
Total Protein: 6 g/dL — ABNORMAL LOW (ref 6.5–8.1)

## 2019-11-11 LAB — MAGNESIUM: Magnesium: 2 mg/dL (ref 1.7–2.4)

## 2019-11-11 LAB — PHOSPHORUS: Phosphorus: 3.1 mg/dL (ref 2.5–4.6)

## 2019-11-11 MED ORDER — ADULT MULTIVITAMIN W/MINERALS CH
1.0000 | ORAL_TABLET | Freq: Every day | ORAL | Status: DC
  Administered 2019-11-13 – 2019-11-15 (×3): 1 via ORAL
  Filled 2019-11-11 (×3): qty 1

## 2019-11-11 MED ORDER — FOLIC ACID 1 MG PO TABS
1.0000 mg | ORAL_TABLET | Freq: Every day | ORAL | Status: DC
  Administered 2019-11-13 – 2019-11-15 (×3): 1 mg via ORAL
  Filled 2019-11-11 (×3): qty 1

## 2019-11-11 MED ORDER — THIAMINE HCL 100 MG PO TABS: 100.0000 mg | ORAL_TABLET | Freq: Every day | ORAL | Status: DC

## 2019-11-11 MED ORDER — KETOROLAC TROMETHAMINE 30 MG/ML IJ SOLN
30.0000 mg | Freq: Three times a day (TID) | INTRAMUSCULAR | Status: DC | PRN
  Administered 2019-11-11 – 2019-11-13 (×5): 30 mg via INTRAVENOUS
  Filled 2019-11-11 (×5): qty 1

## 2019-11-11 MED ORDER — PANTOPRAZOLE SODIUM 40 MG PO TBEC
40.0000 mg | DELAYED_RELEASE_TABLET | Freq: Every day | ORAL | Status: DC
  Administered 2019-11-12 – 2019-11-14 (×3): 40 mg via ORAL
  Filled 2019-11-11 (×3): qty 1

## 2019-11-11 MED ORDER — LACTULOSE 10 GM/15ML PO SOLN
30.0000 g | Freq: Three times a day (TID) | ORAL | Status: DC
  Administered 2019-11-12 – 2019-11-14 (×6): 30 g via ORAL
  Filled 2019-11-11 (×6): qty 45

## 2019-11-11 MED ORDER — MORPHINE SULFATE (PF) 2 MG/ML IV SOLN
1.0000 mg | INTRAVENOUS | Status: DC | PRN
  Administered 2019-11-11 (×2): 1 mg via INTRAVENOUS
  Administered 2019-11-12: 2 mg via INTRAVENOUS
  Filled 2019-11-11 (×3): qty 1

## 2019-11-11 MED ORDER — CHLORHEXIDINE GLUCONATE CLOTH 2 % EX PADS
6.0000 | MEDICATED_PAD | Freq: Every day | CUTANEOUS | Status: DC
  Administered 2019-11-11 – 2019-11-14 (×3): 6 via TOPICAL

## 2019-11-11 MED ORDER — FENTANYL 2500MCG IN NS 250ML (10MCG/ML) PREMIX INFUSION
0.0000 ug/h | INTRAVENOUS | Status: DC
  Administered 2019-11-11: 25 ug/h via INTRAVENOUS
  Filled 2019-11-11: qty 250

## 2019-11-11 MED ORDER — SODIUM CHLORIDE 0.9 % IV SOLN
3.0000 g | Freq: Four times a day (QID) | INTRAVENOUS | Status: DC
  Administered 2019-11-11 – 2019-11-14 (×13): 3 g via INTRAVENOUS
  Filled 2019-11-11 (×3): qty 8
  Filled 2019-11-11: qty 3
  Filled 2019-11-11 (×2): qty 8
  Filled 2019-11-11: qty 3
  Filled 2019-11-11 (×6): qty 8
  Filled 2019-11-11: qty 3

## 2019-11-11 NOTE — Progress Notes (Signed)
eLink Physician-Brief Progress Note Patient Name: Amy Villa DOB: Aug 03, 1965 MRN: [id number]   Date of Service  11/11/2019  HPI/Events of Note  Sub-optimal  Analgesia and sedation  eICU Interventions  Fentanyl infusion ordered.        [name] 11/11/2019, 1:46 AM

## 2019-11-11 NOTE — Progress Notes (Signed)
Pharmacy Antibiotic Note  Amy Villa is a 54 y.o. female admitted on 11/10/2019 with aspiration pneumonia.  Pharmacy has been consulted for unasyn dosing. SCr trended down to 0.83.   Plan: Increase Unasyn to 3g IV Q 6 hours  Monitor CBC, renal fx, cultures and clinical progress  F/u LOT  Height: 5\' 5"  (165.1 cm) Weight: 59.7 kg (131 lb 9.8 oz) IBW/kg (Calculated) : 57  Temp (24hrs), Avg:94.5 F (34.7 C), Min:90 F (32.2 C), Max:99.7 F (37.6 C)  Recent Labs  Lab 11/10/19 1015 11/10/19 1321 11/10/19 1510 11/10/19 1920 11/11/19 0541  WBC 36.4* 27.8*  --   --  14.4*  CREATININE 1.67*  --  1.26* 1.06* 0.83  LATICACIDVEN 9.3* 3.8*  --   --   --     Estimated Creatinine Clearance: 70.5 mL/min (by C-G formula based on SCr of 0.83 mg/dL).    No Known Allergies   Arturo Morton, PharmD, BCPS Please check AMION for all [place] contact numbers Clinical Pharmacist 11/11/2019 7:37 AM

## 2019-11-11 NOTE — Progress Notes (Signed)
eLink Physician-Brief Progress Note Patient Name: Amy Villa DOB: [birth year] MRN: [id number]   Date of Service  11/11/2019  HPI/Events of Note  Pt observed for an extended period on the ventilator and noted to be well sedated  eICU Interventions  No intervention at this time        Frederik Pear 11/11/2019, 1:35 AM

## 2019-11-11 NOTE — Progress Notes (Signed)
[facility] hospitalist to assume care on 11/12/19 at [place], MD  [facility] PGY3  Pager: [phone]

## 2019-11-11 NOTE — Progress Notes (Signed)
Patient suddenly became responsive and attempted to pull out ET tube and was immediatly stopped. She was reoriented to place and situation. Patient follows commands but agitated at times and coughing over the ventilator. PRN Fentanyl given. E-link notified and a Fentanyl infusion requested.

## 2019-11-11 NOTE — Procedures (Signed)
Extubation Procedure Note  Patient Details:   Name: Amy Villa DOB: February 27, 1966 MRN: [id number]   Airway Documentation:    Vent end date: 11/11/19 Vent end time: 0845   Evaluation  O2 sats: stable throughout Complications: No apparent complications Patient did tolerate procedure well. Bilateral Breath Sounds: Clear, Diminished   Yes  Patient extubated to 3L nasal cannula per MD order.  Positive cuff leak noted.  No evidence of stridor.  Patient able to speak post extubation.  Attempted incentive spirometry however patient having difficulty understanding directions on use.  No complications noted at this time.   Judith Part 11/11/2019, 8:48 AM

## 2019-11-11 NOTE — Evaluation (Signed)
Clinical/Bedside Swallow Evaluation Patient Details  Name: Amy Villa MRN: [id number] Date of Birth: July 21, 1965  Today's Date: 11/11/2019 Time: SLP Start Time (ACUTE ONLY): [id number] SLP Stop Time (ACUTE ONLY): 1654 SLP Time Calculation (min) (ACUTE ONLY): 13 min  Past Medical History:  Past Medical History:  Diagnosis Date  . Alcoholic hepatitis   . Aortic atherosclerosis ([place])   . Arthritis    neck  . Carpal tunnel syndrome on both sides 02/2012  . Cigarette nicotine dependence   . Contact lens/glasses fitting    wears contacts or glasses  . Dental crowns present    x 2 upper front  . Elevated LFTs   . Fatty liver   . Fluid retention   . History of MRSA infection   . Hypertension   . Insomnia   . Migraines   . Osteoarthritis   . Parasomnia   . Wears partial dentures    lower   Past Surgical History:  Past Surgical History:  Procedure Laterality Date  . ANKLE ARTHROSCOPY WITH RECONSTRUCTION  07/2017  . BIOPSY  02/24/2018   Procedure: BIOPSY;  Surgeon: Jackquline Denmark, MD;  [facility];  Service: Endoscopy;;  . CARPAL TUNNEL RELEASE  03/17/2012   Procedure: CARPAL TUNNEL RELEASE;  Surgeon: Roseanne Kaufman, MD;  Location: [place];  Service: Orthopedics;  Laterality: Bilateral;  left limited open carpal tunnel release. right carpal tunnel injection with 1cc of celestone and 2cc of marcaine.25%  . CARPAL TUNNEL RELEASE  06/30/2012   Procedure: CARPAL TUNNEL RELEASE;  Surgeon: Roseanne Kaufman, MD;  Location: [place];  Service: Orthopedics;  Laterality: Right;  RIGHT LIMITED OPEN CARPAL TUNNEL RELEASE  . CARPOMETACARPAL (CMC) FUSION OF THUMB Left 06/23/2013   Procedure: LEFT CARPOMETACARPEL ([place]) FUSION OF THUMB WITH AUTOGRAFT FROM RADIUS AND REPAIR AS NECESSARY;  Surgeon: Roseanne Kaufman, MD;  Location: [place];  Service: Orthopedics;  Laterality: Left;  . COLONOSCOPY  2014  . ESOPHAGOGASTRODUODENOSCOPY  05/2017  .  ESOPHAGOGASTRODUODENOSCOPY (EGD) WITH PROPOFOL N/A 02/24/2018   Procedure: ESOPHAGOGASTRODUODENOSCOPY (EGD) WITH PROPOFOL;  Surgeon: Jackquline Denmark, MD;  [facility];  Service: Endoscopy;  Laterality: N/A;  . HARDWARE REMOVAL Left 07/10/2013   Procedure: HARDWARE REMOVAL;  Surgeon: Roseanne Kaufman, MD;  [facility];  Service: Orthopedics;  Laterality: Left;  . INCISION AND DRAINAGE OF WOUND Left 07/10/2013   Procedure: IRRIGATION AND DEBRIDEMENT WOUND left wrist  ;  Surgeon: Roseanne Kaufman, MD;  [facility];  Service: Orthopedics;  Laterality: Left;  . LAPAROSCOPIC VAGINAL HYSTERECTOMY  02/24/2010  . LAPAROSCOPY  06/2010   for adhesions  . LEEP     x 2  . LUMBAR DISC SURGERY  03/23/2003   left L5-S1 discectomy with microdissection  . LUMBAR DISC SURGERY  05/23/2003   redo discectomy L5-S1 with microdissection   HPI:  54 year old woman with a history of alcohol use and cirrhosis, depression, hypertension.  She was found altered and poorly responsive by her husband around 0530, then completely unresponsive and pulseless at 0730.  CPR was started, patient was in asystole when EMS arrived.  The estimated time to ROSC was over 30 minutes.  Brought to the ED for further support and care ventilated and hemodynamically stabilized.  GCS on arrival 3. Pt was intubated from 4/23-4/24.     Assessment / Plan / Recommendation Clinical Impression  Pt was seen for a bedside swallow evaluation in the setting of recent extubation earlier this morning.  Pt was encountered awake/alert in  bed.   She was pleasant and mildly confused throughout this evaluation.  She reported that she generally consumes regular solids and thin liquids at baseline without difficulty.  Pt was noted to have mild dysphonia and breathy/hoarse vocal quality.  Oral mechanism exam was unremarkable.  Pt consumed trials of thin liquid, nectar-thick liquid, honey-thick liquid, and puree.  Suspect delayed swallow initiation with all  trials.  Pt exhibited an immediate or delayed cough following all PO trials despite size of the bolus.  Cough was noted to be strong but congested.  Recommend continuation of strict NPO with frequent oral care and consideration of short-term alternative means of nutrition.  Pt will benefit from an instrumental swallow study if s/sx of aspiration persist.  SLP will f/u closely to determine readiness for clinical diet initiation vs instrumental swallow study.   SLP Visit Diagnosis: Dysphagia, unspecified (R13.10)    Aspiration Risk  Moderate aspiration risk    Diet Recommendation NPO;Alternative means - temporary   Medication Administration: Via alternative means    Other  Recommendations Oral Care Recommendations: Oral care QID;Staff/trained caregiver to provide oral care   Follow up Recommendations Other (comment)(TBD)      Frequency and Duration min 2x/week  2 weeks       Prognosis Prognosis for Safe Diet Advancement: Good Barriers to Reach Goals: Cognitive deficits      Swallow Study   General HPI: 54 year old woman with a history of alcohol use and cirrhosis, depression, hypertension.  She was found altered and poorly responsive by her husband around 0530, then completely unresponsive and pulseless at 0730.  CPR was started, patient was in asystole when EMS arrived.  The estimated time to ROSC was over 30 minutes.  Brought to the ED for further support and care ventilated and hemodynamically stabilized.  GCS on arrival 3. Pt was intubated from 4/23-4/24.   Type of Study: Bedside Swallow Evaluation Previous Swallow Assessment: None  Diet Prior to this Study: NPO Temperature Spikes Noted: Yes Respiratory Status: Nasal cannula History of Recent Intubation: Yes Length of Intubations (days): 1 days Date extubated: 11/11/19 Behavior/Cognition: Alert;Cooperative;Pleasant mood;Confused Oral Cavity Assessment: Within Functional Limits Oral Care Completed by SLP: No Oral Cavity -  Dentition: Adequate natural dentition Vision: Functional for self-feeding Self-Feeding Abilities: Needs assist Patient Positioning: Upright in bed Baseline Vocal Quality: Breathy;Hoarse;Low vocal intensity Volitional Cough: Strong;Congested Volitional Swallow: Able to elicit    Oral/Motor/Sensory Function Overall Oral Motor/Sensory Function: Within functional limits   Ice Chips Ice chips: Impaired Presentation: Spoon Pharyngeal Phase Impairments: Suspected delayed Swallow;Cough - Delayed   Thin Liquid Thin Liquid: Impaired Presentation: Spoon;Straw Pharyngeal  Phase Impairments: Suspected delayed Swallow;Cough - Immediate    Nectar Thick Nectar Thick Liquid: Impaired Presentation: Spoon;Straw Pharyngeal Phase Impairments: Suspected delayed Swallow;Cough - Immediate   Honey Thick Honey Thick Liquid: Impaired Presentation: Spoon;Straw Pharyngeal Phase Impairments: Suspected delayed Swallow;Cough - Immediate   Puree Puree: Impaired Presentation: Spoon Pharyngeal Phase Impairments: Suspected delayed Swallow;Cough - Immediate   Solid     Solid: Not tested     Colin Mulders M.S., CCC-SLP Acute Rehabilitation Services Office: [phone]  Elvia Collum Roshan Roback 11/11/2019,5:06 PM

## 2019-11-11 NOTE — Progress Notes (Signed)
NAME:  Amy Villa, MRN:  [id number], DOB:  03-12-1966, LOS: 1 ADMISSION DATE:  11/10/2019, CONSULTATION DATE:  11/10/19 REFERRING MD:  EDP, CHIEF COMPLAINT:  Cardiac arrest   Brief History   53 yof w/ hx as mentioned below. Was in [place] until am 4/23 when she was found initially altered about 0530 then about 2 hrs later found in full cardiac arrest. Time to ROSC minimally 30 minutes. GCS 3 on arrival. PCCM asked to admit   History of present illness   54 year old female with history as mentioned below, followed at [facility] family medicine.  Most recently seen in office for medication management, requiring Seroquel refill while searching for new psychiatrist. Was in normal state of health on 4/22. Husband sleeps in another room. Said he saw her light on about 2am (a common occurrence when she can't sleep), he checked on her about 530 am, at that time she was altered (slow to respond, sleepy but would sit up in bed), he said she had done this before (eluding to self medicating with her meds) and left the room. He again went to check on her he estimated 2 hrs later , Husband was not able to wake her up this morning, found her pulseless and not breathing.  He initiated CPR, EMS called, asystole on arrival, time to return of spontaneous circulation estimated at over 30 minutes.   Past Medical History  Former tobacco abuse, , Anxiety/depression, insomnia alcoholic related cirrhosis of the liver with esophageal varices, COPD, gastroesophageal reflux disease without esophagitis, hypertension, iron deficiency anemia  Significant Hospital Events   4/23 admitted  Consults:  None  Procedures:  4/23: ETT  Significant Diagnostic Tests:  4/23: CT Head: Negative  4/23: EEG: Diffuse encephalopathy without epileptic discharge  Micro Data:  4/23: SARs COVID negative  4/23: Respiratory cultures pending   Antimicrobials:  4/23 Unsayn >> Current   Interim history/subjective:  Woke up around  1AM. Following commands.   Objective   Blood pressure 130/76, pulse 99, temperature 99.7 F (37.6 C), resp. rate 15, height 5\' 5"  (1.651 m), SpO2 96 %.    Vent Mode: PRVC FiO2 (%):  [40 %-100 %] 40 % Set Rate:  [16 bmp-24 bmp] 16 bmp Vt Set:  [450 mL] 450 mL PEEP:  [5 cmH20-8 cmH20] 8 cmH20 Plateau Pressure:  [17 cmH20-25 cmH20] 17 cmH20   Intake/Output Summary (Last 24 hours) at 11/11/2019 0558 Last data filed at 11/11/2019 0434 Gross per 24 hour  Intake 4266.15 ml  Output 2200 ml  Net 2066.15 ml   There were no vitals filed for this visit.  Examination: General: Well nourished female, ETT in place HENT: Normocephalic, atraumatic, ETT in place Pulm: Good air movement with minimal wheezing CV: RRR, no murmurs, no rubs  Abdomen: Active bowel sounds, soft, non-distended, no tenderness to palpation  Extremities: Pulses palpable in all extremities, no LE edema  Skin: Warm and dry  Neuro: Alert, following commands, moving all extremities equally   Resolved Hospital Problem list   HAGMA Hypothermia AKI  Assessment & Plan:  Cardiopulmonary arrest, asystole initial rhythm.  Etiology unclear - Repeat EKG this AM  - Obtain TTE   Acute hypoxic respiratory failure following cardiopulmonary arrest, complicated by Aspiration pneumonia and probable right lower rib fracture as a consequence of CPR - Currently on minimal vent settings will plan for SBT today  - Follow-up sputum cultures - VAP bundle - Continue Unasyn day 2 - RASS goal of 0   Acute  metabolic encephalopathy - Alert and following commands this AM - CT head negative. EEG with diffuse encephalopathy  - Continue lactulose  - Continue holding buspar, flexeril, prozac, and neurontin for now   Leukocytosis in setting of aspiration and reactive component as well - Continue Unasyn as outlined above  - Follow cultures.  - Follow fever curve and trend WBC  History of alcohol related liver cirrhosis without ascites but  with history of esophageal varices Mild coagulopathy  - Repeat INR  - Continue lactulose   Hx anxiety and depression - Continue holding buspar, flexeril, prozac, and neurontin for now   History of gastroesophageal reflux disease - PPI  History of iron deficiency anemia - Trend cbc  Best practice:  Diet: NPO Pain/Anxiety/Delirium protocol (if indicated): 4/23 VAP protocol (if indicated): 4/23 DVT prophylaxis: [place] heparin GI prophylaxis: PPI Glucose control: na Mobility: br Code Status: dnr if arrests Family Communication: Will call family  Disposition: ICU  Labs   CBC: Recent Labs  Lab 11/10/19 1015 11/10/19 1213 11/10/19 1321 11/10/19 1611  WBC 36.4*  --  27.8*  --   NEUTROABS 28.0*  --   --   --   HGB 12.9 14.3 13.7 14.3  HCT 46.9* 42.0 46.9* 42.0  MCV 98.5  --  91.8  --   PLT 191  --  154  --     Basic Metabolic Panel: Recent Labs  Lab 11/10/19 1015 11/10/19 1213 11/10/19 1510 11/10/19 1611 11/10/19 1920  NA 134* 133* 134* 133* 134*  K 5.4* 5.5* 6.5* 4.5 4.5  CL 103  --  102  --  105  CO2 12*  --  17*  --  17*  GLUCOSE 136*  --  112*  --  219*  BUN 18  --  17  --  18  CREATININE 1.67*  --  1.26*  --  1.06*  CALCIUM 8.2*  --  8.1*  --  7.5*   GFR: CrCl cannot be calculated (Unknown ideal weight.). Recent Labs  Lab 11/10/19 1015 11/10/19 1321  WBC 36.4* 27.8*  LATICACIDVEN 9.3* 3.8*    Liver Function Tests: Recent Labs  Lab 11/10/19 1015  AST 318*  ALT 252*  ALKPHOS 173*  BILITOT 0.7  PROT 6.8  ALBUMIN 3.7   No results for input(s): LIPASE, AMYLASE in the last 168 hours. Recent Labs  Lab 11/10/19 1015  AMMONIA 122*    ABG    Component Value Date/Time   PHART 7.230 (L) 11/10/2019 1611   PCO2ART 39.1 11/10/2019 1611   PO2ART 78 (L) 11/10/2019 1611   HCO3 17.0 (L) 11/10/2019 1611   TCO2 18 (L) 11/10/2019 1611   ACIDBASEDEF 11.0 (H) 11/10/2019 1611   O2SAT 95.0 11/10/2019 1611     Coagulation Profile: Recent Labs  Lab  11/10/19 1015  INR 1.7*    Cardiac Enzymes: No results for input(s): CKTOTAL, CKMB, CKMBINDEX, TROPONINI in the last 168 hours.  HbA1C: Hgb A1c MFr Bld  Date/Time Value Ref Range Status  11/30/2016 03:13 PM 6.0 4.6 - 6.5 % Final    Comment:    Glycemic Control Guidelines for People with Diabetes:Non Diabetic:  <6%Goal of Therapy: <7%Additional Action Suggested:  >8%   09/12/2008 01:25 AM  4.6 - 6.1 % Final   5.8 (NOTE)   The ADA recommends the following therapeutic goal for glycemic   control related to Hgb A1C measurement:   Goal of Therapy:   < 7.0% Hgb A1C   Reference: American Diabetes Association:  Clinical Practice   Recommendations 2008, Diabetes Care,  2008, 31:(Suppl 1).    CBG: Recent Labs  Lab 11/10/19 1336 11/10/19 1604 11/10/19 1951 11/10/19 2342 11/11/19 0327  GLUCAP 114* 111* 152* 92 112*    Review of Systems:   Unable to obtain due to critical illness.   Past Medical History  She,  has a past medical history of Alcoholic hepatitis, Aortic atherosclerosis ([place]), Arthritis, Carpal tunnel syndrome on both sides (02/2012), Cigarette nicotine dependence, Contact lens/glasses fitting, Dental crowns present, Elevated LFTs, Fatty liver, Fluid retention, History of MRSA infection, Hypertension, Insomnia, Migraines, Osteoarthritis, Parasomnia, and Wears partial dentures.   Surgical History    Past Surgical History:  Procedure Laterality Date  . ANKLE ARTHROSCOPY WITH RECONSTRUCTION  07/2017  . BIOPSY  02/24/2018   Procedure: BIOPSY;  Surgeon: Jackquline Denmark, MD;  [facility];  Service: Endoscopy;;  . CARPAL TUNNEL RELEASE  03/17/2012   Procedure: CARPAL TUNNEL RELEASE;  Surgeon: Roseanne Kaufman, MD;  Location: [place];  Service: Orthopedics;  Laterality: Bilateral;  left limited open carpal tunnel release. right carpal tunnel injection with 1cc of celestone and 2cc of marcaine.25%  . CARPAL TUNNEL RELEASE  06/30/2012   Procedure: CARPAL  TUNNEL RELEASE;  Surgeon: Roseanne Kaufman, MD;  Location: [place];  Service: Orthopedics;  Laterality: Right;  RIGHT LIMITED OPEN CARPAL TUNNEL RELEASE  . CARPOMETACARPAL (CMC) FUSION OF THUMB Left 06/23/2013   Procedure: LEFT CARPOMETACARPEL ([place]) FUSION OF THUMB WITH AUTOGRAFT FROM RADIUS AND REPAIR AS NECESSARY;  Surgeon: Roseanne Kaufman, MD;  Location: [place];  Service: Orthopedics;  Laterality: Left;  . COLONOSCOPY  2014  . ESOPHAGOGASTRODUODENOSCOPY  05/2017  . ESOPHAGOGASTRODUODENOSCOPY (EGD) WITH PROPOFOL N/A 02/24/2018   Procedure: ESOPHAGOGASTRODUODENOSCOPY (EGD) WITH PROPOFOL;  Surgeon: Jackquline Denmark, MD;  [facility];  Service: Endoscopy;  Laterality: N/A;  . HARDWARE REMOVAL Left 07/10/2013   Procedure: HARDWARE REMOVAL;  Surgeon: Roseanne Kaufman, MD;  [facility];  Service: Orthopedics;  Laterality: Left;  . INCISION AND DRAINAGE OF WOUND Left 07/10/2013   Procedure: IRRIGATION AND DEBRIDEMENT WOUND left wrist  ;  Surgeon: Roseanne Kaufman, MD;  [facility];  Service: Orthopedics;  Laterality: Left;  . LAPAROSCOPIC VAGINAL HYSTERECTOMY  02/24/2010  . LAPAROSCOPY  06/2010   for adhesions  . LEEP     x 2  . LUMBAR DISC SURGERY  03/23/2003   left L5-S1 discectomy with microdissection  . LUMBAR [place] SURGERY  05/23/2003   redo discectomy L5-S1 with microdissection     Social History   reports that she has quit smoking. She has never used smokeless tobacco. She reports current alcohol use. She reports that she does not use drugs.   Family History   Her family history includes Colon polyps in her sister; Diabetes in her mother and sister; Hypertension in her mother and sister; Lung cancer in her maternal grandmother. There is no history of Colon cancer, Esophageal cancer, Pancreatic cancer, Stomach cancer, or Liver disease.   Allergies No Known Allergies   Home Medications  Prior to Admission medications   Medication Sig Start Date End Date Taking?  Authorizing Provider  albuterol (VENTOLIN HFA) 108 (90 Base) MCG/ACT inhaler Inhale 1 puff into the lungs every 6 (six) hours as needed for wheezing or shortness of breath.    [provider]  alendronate (FOSAMAX) 70 MG tablet Take with a full glass of water on an empty stomach. STOP FOR NOW 02/23/2018 Patient not taking: Reported on  03/08/2018 02/23/18   [name], MD  aspirin EC 325 MG tablet Take 325 mg by mouth daily as needed (pain).    [provider]  busPIRone (BUSPAR) 15 MG tablet TAKE 1 TABLET(15 MG) BY MOUTH TWICE DAILY 01/17/18   [name], PA-C  busPIRone (BUSPAR) 15 MG tablet Take 15 mg by mouth 2 (two) times daily. 07/08/19   [provider]  clotrimazole-betamethasone (LOTRISONE) cream Apply 1 application topically 2 (two) times daily. 03/08/18   [name], PA-C  cyclobenzaprine (FLEXERIL) 10 MG tablet Take 1 tablet (10 mg total) by mouth 3 (three) times daily as needed for muscle spasms. 05/20/18   [name], PA-C  FLUoxetine (PROZAC) 40 MG capsule Take 1 capsule (40 mg total) by mouth daily. 04/12/18   [name], PA-C  FLUoxetine (PROZAC) 40 MG capsule Take 40 mg by mouth daily. 08/21/19   [provider]  fluticasone (FLONASE) 50 MCG/ACT nasal spray Place 2 sprays into both nostrils daily. 03/10/18   [name], PA-C  fluticasone [facility]) 50 MCG/ACT nasal spray Place 2 sprays into both nostrils daily as needed for allergies or rhinitis.  06/26/19   [provider]  folic acid (FOLVITE) 1 MG tablet TAKE 1 TABLET(1 MG) BY MOUTH DAILY 06/01/18   [name], PA-C  folic acid (FOLVITE) 1 MG tablet Take 1 tablet (1 mg total) by mouth daily. 09/20/19   Mitzi Hansen, MD  gabapentin (NEURONTIN) 100 MG capsule TAKE 1 CAPSULE EVERY MORNING, NOON AND AT NIGHT WITH THE 300 MG CAPSULE 04/26/18   [name], PA-C  gabapentin (NEURONTIN) 300 MG capsule TAKE 2 CAPSULES BY MOUTH EVERY MORNING AND NOON THEN  3 CAPSULES EVERY EVENING 06/29/18   [name], PA-C  hydrochlorothiazide (HYDRODIURIL) 25 MG tablet Take 25 mg by mouth daily.    [provider]  ibuprofen (ADVIL) 800 MG tablet Take 800 mg by mouth every 8 (eight) hours as needed (pain).    [provider]  lactulose ([place]) 10 GM/15ML solution TAKE 30ML BY MOUTH EVERY DAY 06/10/18   [name], MD  lactulose (CHRONULAC) 10 GM/15ML solution Take 20 g by mouth daily. 09/11/19   [provider]  Multiple Vitamin (MULTIVITAMIN WITH MINERALS) TABS tablet Take 1 tablet by mouth daily.    [provider]  nystatin (MYCOSTATIN) 100000 UNIT/ML suspension Take 5 mLs by mouth 4 (four) times daily as needed (thrush from inhalers (swish and spit)).    [provider]  ondansetron (ZOFRAN-ODT) 4 MG disintegrating tablet DISSOLVE 1 TABLET(4 MG) ON THE TONGUE EVERY 8 HOURS AS NEEDED FOR NAUSEA OR VOMITING 04/13/18   [name], MD  ondansetron (ZOFRAN-ODT) 4 MG disintegrating tablet Take 4 mg by mouth every 8 (eight) hours as needed for nausea or vomiting.  07/25/19   [provider]  pantoprazole (PROTONIX) 40 MG tablet TAKE 1 TABLET(40 MG) BY MOUTH DAILY BEFORE BREAKFAST 08/15/18   [name], MD  pantoprazole (PROTONIX) 40 MG tablet Take 40 mg by mouth daily. 09/11/19   [provider]  polyvinyl alcohol (ARTIFICIAL TEARS) 1.4 % ophthalmic solution Place 1 drop into both eyes daily.    [provider]  potassium chloride SA (K-DUR,KLOR-CON) 20 MEQ tablet Take 20 mEq by mouth 2 (two) times daily.    [provider]  propranolol (INDERAL) 20 MG tablet TAKE 1 TABLET(20 MG) BY MOUTH TWICE DAILY 06/01/18   [name], PA-C  propranolol (INDERAL) 20 MG tablet Take 20 mg by mouth 2 (two) times daily. 07/17/19   [provider]  thiamine 100 MG tablet Take 1 tablet (100 mg total) by mouth daily. 09/20/19   Mitzi Hansen, MD  VENTOLIN HFA 108 (90 Base)  MCG/ACT inhaler INHALE 2 PUFFS INTO THE LUNGS EVERY 6 HOURS AS NEEDED FOR WHEEZING OR SHORTNESS OF BREATH 05/19/18   [name], PA-C  Vitamin D, Ergocalciferol, (DRISDOL) 1.25 MG (50000 UNIT) CAPS capsule Take 50,000 Units by mouth every Monday. 05/01/19   [provider]       Ina Homes, MD  [facility] PGY3  Pager: [phone]

## 2019-11-12 ENCOUNTER — Inpatient Hospital Stay (HOSPITAL_COMMUNITY): Payer: BLUE CROSS/BLUE SHIELD

## 2019-11-12 DIAGNOSIS — J690 Pneumonitis due to inhalation of food and vomit: Secondary | ICD-10-CM

## 2019-11-12 LAB — CBC
HCT: 32.9 % — ABNORMAL LOW (ref 36.0–46.0)
Hemoglobin: 10.4 g/dL — ABNORMAL LOW (ref 12.0–15.0)
MCH: 26.9 pg (ref 26.0–34.0)
MCHC: 31.6 g/dL (ref 30.0–36.0)
MCV: 85 fL (ref 80.0–100.0)
Platelets: 102 10*3/uL — ABNORMAL LOW (ref 150–400)
RBC: 3.87 MIL/uL (ref 3.87–5.11)
RDW: 22.3 % — ABNORMAL HIGH (ref 11.5–15.5)
WBC: 9.6 10*3/uL (ref 4.0–10.5)
nRBC: 0 % (ref 0.0–0.2)

## 2019-11-12 LAB — BASIC METABOLIC PANEL
Anion gap: 11 (ref 5–15)
Anion gap: 13 (ref 5–15)
BUN: <5 mg/dL — ABNORMAL LOW (ref 6–20)
BUN: <5 mg/dL — ABNORMAL LOW (ref 6–20)
CO2: 27 mmol/L (ref 22–32)
CO2: 24 mmol/L (ref 22–32)
Calcium: 8.7 mg/dL — ABNORMAL LOW (ref 8.9–10.3)
Calcium: 8.9 mg/dL (ref 8.9–10.3)
Chloride: 103 mmol/L (ref 98–111)
Chloride: 101 mmol/L (ref 98–111)
Creatinine, Ser: 0.57 mg/dL (ref 0.44–1.00)
Creatinine, Ser: 0.57 mg/dL (ref 0.44–1.00)
GFR calc Af Amer: >60 mL/min (ref >60)
GFR calc Af Amer: >60 mL/min (ref >60)
GFR calc non Af Amer: >60 mL/min (ref >60)
GFR calc non Af Amer: >60 mL/min (ref >60)
Glucose, Bld: 118 mg/dL — ABNORMAL HIGH (ref 70–99)
Glucose, Bld: 102 mg/dL — ABNORMAL HIGH (ref 70–99)
Potassium: 2.9 mmol/L — ABNORMAL LOW (ref 3.5–5.1)
Potassium: 3.6 mmol/L (ref 3.5–5.1)
Sodium: 141 mmol/L (ref 135–145)
Sodium: 138 mmol/L (ref 135–145)

## 2019-11-12 LAB — GLUCOSE, CAPILLARY
Glucose-Capillary: 109 mg/dL — ABNORMAL HIGH (ref 70–99)
Glucose-Capillary: 126 mg/dL — ABNORMAL HIGH (ref 70–99)
Glucose-Capillary: 117 mg/dL — ABNORMAL HIGH (ref 70–99)
Glucose-Capillary: 101 mg/dL — ABNORMAL HIGH (ref 70–99)
Glucose-Capillary: 100 mg/dL — ABNORMAL HIGH (ref 70–99)

## 2019-11-12 LAB — MAGNESIUM: Magnesium: 1.7 mg/dL (ref 1.7–2.4)

## 2019-11-12 MED ORDER — MAGNESIUM SULFATE 2 GM/50ML IV SOLN
2.0000 g | Freq: Once | INTRAVENOUS | Status: AC
  Administered 2019-11-12: 2 g via INTRAVENOUS
  Filled 2019-11-12: qty 50

## 2019-11-12 MED ORDER — POTASSIUM CHLORIDE CRYS ER 20 MEQ PO TBCR
30.0000 meq | EXTENDED_RELEASE_TABLET | Freq: Once | ORAL | Status: AC
  Administered 2019-11-12: 30 meq via ORAL
  Filled 2019-11-12: qty 1

## 2019-11-12 MED ORDER — POTASSIUM CHLORIDE CRYS ER 20 MEQ PO TBCR: 40.0000 meq | EXTENDED_RELEASE_TABLET | Freq: Once | ORAL | Status: DC

## 2019-11-12 MED ORDER — DEXTROSE-NACL 5-0.45 % IV SOLN: INTRAVENOUS | Status: DC

## 2019-11-12 MED ORDER — POTASSIUM CHLORIDE 10 MEQ/100ML IV SOLN
10.0000 meq | INTRAVENOUS | Status: AC
  Administered 2019-11-12 (×5): 10 meq via INTRAVENOUS
  Filled 2019-11-12: qty 100

## 2019-11-12 MED ORDER — SODIUM CHLORIDE 0.9 % IV SOLN
INTRAVENOUS | Status: DC | PRN
  Administered 2019-11-12 – 2019-11-14 (×2): 250 mL via INTRAVENOUS

## 2019-11-12 MED ORDER — THIAMINE HCL 100 MG/ML IJ SOLN
250.0000 mg | Freq: Three times a day (TID) | INTRAVENOUS | Status: DC
  Administered 2019-11-12 – 2019-11-15 (×10): 250 mg via INTRAVENOUS
  Filled 2019-11-12 (×13): qty 2.5

## 2019-11-12 MED ORDER — LACTATED RINGERS IV SOLN: INTRAVENOUS | Status: DC

## 2019-11-12 NOTE — Progress Notes (Signed)
Patient arrived to unit, continuously trying to get out of bed, pulling at IV lines, is alert and oriented to person, disoriented to place, time and situation. Patient is a high fall risk and requiring frequent verbal contacts, is agitated and consecutively states "I am looking for Bill". RN called Rush Landmark, patient's spouse, patient continues to get out of bed after phone call has ended. RN paged Trellis Moment, MD regarding safety sitter order- verbal orders received to put in order for safety sitter. Orders have been implemented.

## 2019-11-12 NOTE — Progress Notes (Signed)
PROGRESS NOTE                                                                                                                                                                                                             Patient Demographics:    Keirstin Leacock, is a 54 y.o. female, DOB - 1966-02-08, [id number]  Admit date - 11/10/2019   Admitting Physician Collene Gobble, MD  Outpatient Primary MD for the patient is Patient, No Pcp Per  LOS - 2   Chief Complaint  Patient presents with  . CPR       Brief Narrative    54 year old female with PMH of Former tobacco abuse,, Anxiety/depression, insomnia, alcoholic related cirrhosis of the liver with esophageal varices, COPD, gastroesophageal reflux disease without esophagitis, hypertension, iron deficiency anemia , with Recent PCP visit requiring Seroquel refill while searching for new psychiatrist. Was in normal state of healthon 4/22. Husband sleeps in another room. Said he saw her light on about 2am (a common occurrence when she can't sleep), he checked on her about 530 am, at that time she was altered (slow to respond, sleepy but would sit up in bed), he said she had done this before (eluding to self medicating with her meds) and left the room. He again went to check on her he estimated 2 hrs later, Husband was not able to wake her up this morning, found her pulseless and not breathing. He initiated CPR, EMS called, asystole on arrival, time to return of spontaneous circulation estimated at over 30 minutes, she was admitted to ICU, where she was encephalopathic required intubation, mentation has improved, where she was successfully extubated 4/24, and transferred [facility] on 4[facility].     Subjective:    Aylee Vasta today is awake, but confused, she herself denies any complaints, as discussed with staff no significant events overnight.  .   Assessment  & Plan :    Principal Problem:  Cardiac arrest [facility]) Active Problems:   Anemia iron def   Liver cirrhosis (HCC)   Anxiety and depression   Substance abuse (HCC)   Coagulopathy (HCC)   Acute metabolic encephalopathy   Acute respiratory failure with hypoxemia (HCC)   Aspiration pneumonia (HCC)   AKI (acute kidney injury) (HCC)   Lactic acidosis   Hyperkalemia   Hypothermia  Cardiopulmonary arrest, asystole initial rhythm. -  Etiology unclear, but some polypharmacy likely contributing, as she was recently started on Eliquis, on top of multiple psychiatric medication including BuSpar, Flexeril, Prozac, Neurontin. -Follow on 2D echo -Continue to monitor on telemetry.  Acute hypoxic respiratory failure following cardiopulmonary arrest,  complicated byAspiration pneumoniaand probable right lower rib fracture as a consequence of CPR -She was successfully extubated 4/24, currently on nasal cannula. -Continue with IV Unasyn.  Acute metabolic encephalopathy -Condition is improving, she is following commands, but certainly not back at baseline. - Alert and following commands this AM - CT head negative. EEG with diffuse encephalopathy  - Continue lactulose  - Continue holding buspar, flexeril, prozac, and neurontin for now - SLP to evaluate for dysphagia. -As well hyperammonemia on admission.  Leukocytosis in setting of aspiration and reactive component as well - Continue Unasyn as outlined above  - Follow cultures.  - Follow fever curve and trend WBC  History of alcohol related liver cirrhosis without ascites but with history of esophageal varices Mild coagulopathy  - Repeat INR  - Continue lactulose , ammonia of 122 on admission - elevated LFTs most likely related cardiac arrest, will continue to trend  Hx anxiety and depression - Continue holding buspar, flexeril, prozac, and neurontin for now  History of gastroesophageal reflux disease - PPI  History of iron deficiency anemia - Trend  cbc  Hypokalemia -Pleated, as well magnesium level of 1.7, will be repleted as well.   COVID-19 Labs  No results for input(s): DDIMER, FERRITIN, LDH, CRP in the last 72 hours.  Lab Results  Component Value Date   SARSCOV2NAA NEGATIVE 11/10/2019   [place] NEGATIVE 09/16/2019     Code Status : Full  Family Communication  : D/W husband a bedside  Disposition Plan  :  Status is: Inpatient  Remains inpatient appropriate because:Hemodynamically unstable and IV treatments appropriate due to intensity of illness or inability to take PO   Dispo: The patient is from: Home              Anticipated d/c is to: To be seen by PT               Anticipated d/c date is: > 3 days              Patient currently is not medically stable to d/c.   Consults  :   PCCM  Procedures  :  None  DVT Prophylaxis  :  [place] heparin  Lab Results  Component Value Date   PLT 102 (L) 11/12/2019    Antibiotics  :    Anti-infectives (From admission, onward)   Start     Dose/Rate Route Frequency Ordered Stop   11/11/19 1200  Ampicillin-Sulbactam (UNASYN) 3 g in sodium chloride 0.9 % 100 mL IVPB     3 g 200 mL/hr over 30 Minutes Intravenous Every 6 hours 11/11/19 0738     11/10/19 1800  ampicillin-sulbactam (UNASYN) 1.5 g in sodium chloride 0.9 % 100 mL IVPB  Status:  Discontinued     1.5 g 200 mL/hr over 30 Minutes Intravenous Every 6 hours 11/10/19 1209 11/11/19 0738   11/10/19 1100  vancomycin (VANCOREADY) IVPB 1250 mg/250 mL     1,250 mg 166.7 mL/hr over 90 Minutes Intravenous  Once 11/10/19 1019 11/10/19 1543   11/10/19 1015  ceFEPIme (MAXIPIME) 2 g in sodium chloride 0.9 % 100 mL IVPB     2 g 200 mL/hr over 30 Minutes Intravenous  Once 11/10/19 1000 11/10/19 1127   11/10/19 1015  metroNIDAZOLE (FLAGYL) IVPB 500 mg     500 mg 100 mL/hr over 60 Minutes Intravenous  Once 11/10/19 1000 11/10/19 1158   11/10/19 1015  vancomycin (VANCOCIN) IVPB 1000 mg/200 mL premix  Status:  Discontinued      1,000 mg 200 mL/hr over 60 Minutes Intravenous  Once 11/10/19 1000 11/10/19 1019        Objective:   Vitals:   11/12/19 0700 11/12/19 0800 11/12/19 0900 11/12/19 1100  BP: (!) 144/87 (!) 148/80 (!) 147/75 (!) 161/87  Pulse: 93 86 89 (!) 103  Resp: 15 (!) 32 (!) 28 17  Temp: (!) 100.4 F (38 C) 100 F (37.8 C) 99.7 F (37.6 C) 100.2 F (37.9 C)  TempSrc:      SpO2: 98% 93% 92% 93%  Weight:      Height:        Wt Readings from Last 3 Encounters:  11/12/19 56.1 kg  09/16/19 62.1 kg  04/12/18 64 kg     Intake/Output Summary (Last 24 hours) at 11/12/2019 1132 Last data filed at 11/12/2019 1100 Gross per 24 hour  Intake 1278.2 ml  Output 5255 ml  Net -3976.8 ml     Physical Exam  Awake Alert, Oriented X 1, he still appears to be significantly confused and altered. Symmetrical Chest wall movement, Good air movement bilaterally, CTAB RRR,No Gallops,Rubs or new Murmurs, No Parasternal Heave +ve B.Sounds, Abd Soft, No tenderness,  No rebound - guarding or rigidity. No Cyanosis, Clubbing or edema, No new Rash or bruise      Data Review:    CBC Recent Labs  Lab 11/10/19 1015 11/10/19 1213 11/10/19 1321 11/10/19 1611 11/11/19 0541 11/11/19 0607 11/12/19 0348  WBC 36.4*  --  27.8*  --  14.4*  --  9.6  HGB 12.9   < > 13.7 14.3 10.7* 11.6* 10.4*  HCT 46.9*   < > 46.9* 42.0 35.6* 34.0* 32.9*  PLT 191  --  154  --  126*  --  102*  MCV 98.5  --  91.8  --  89.0  --  85.0  MCH 27.1  --  26.8  --  26.8  --  26.9  MCHC 27.5*  --  29.2*  --  30.1  --  31.6  RDW 22.5*  --  22.5*  --  22.2*  --  22.3*  LYMPHSABS 3.2  --   --   --   --   --   --   MONOABS 1.9*  --   --   --   --   --   --   EOSABS 0.2  --   --   --   --   --   --   BASOSABS 0.4*  --   --   --   --   --   --    < > = values in this interval not displayed.    Chemistries  Recent Labs  Lab 11/10/19 1015 11/10/19 1213 11/10/19 1510 11/10/19 1611 11/10/19 1920 11/11/19 0541 11/11/19 0607  11/11/19 0716 11/11/19 1654 11/12/19 0535  NA 134*   < > 134*   < > 134* 138 140  --  143 141  K 5.4*   < > 6.5*   < > 4.5 3.9 3.8  --  3.4* 2.9*  CL 103   < > 102  --  105 108  --   --  110 103  CO2 12*   < > 17*  --  17* 22  --   --  24 27  GLUCOSE 136*   < > 112*  --  219* 133*  --   --  99 118*  BUN 18   < > 17  --  18 12  --   --  8 <5*  CREATININE 1.67*   < > 1.26*  --  1.06* 0.83  --   --  0.49 0.57  CALCIUM 8.2*   < > 8.1*  --  7.5* 8.3*  --   --  8.5* 8.7*  MG  --   --   --   --   --  2.0  --   --   --  1.7  AST 318*  --   --   --   --   --   --  341*  --   --   ALT 252*  --   --   --   --   --   --  294*  --   --   ALKPHOS 173*  --   --   --   --   --   --  105  --   --   BILITOT 0.7  --   --   --   --   --   --  0.7  --   --    < > = values in this interval not displayed.   ------------------------------------------------------------------------------------------------------------------ No results for input(s): CHOL, HDL, LDLCALC, TRIG, CHOLHDL, LDLDIRECT in the last 72 hours.  Lab Results  Component Value Date   HGBA1C 6.0 11/30/2016   ------------------------------------------------------------------------------------------------------------------ Recent Labs    11/10/19 1015  TSH 8.099*   ------------------------------------------------------------------------------------------------------------------ No results for input(s): VITAMINB12, FOLATE, FERRITIN, TIBC, IRON, RETICCTPCT in the last 72 hours.  Coagulation profile Recent Labs  Lab 11/10/19 1015 11/11/19 0716  INR 1.7* 1.3*    No results for input(s): DDIMER in the last 72 hours.  Cardiac Enzymes No results for input(s): CKMB, TROPONINI, MYOGLOBIN in the last 168 hours.  Invalid input(s): CK ------------------------------------------------------------------------------------------------------------------    Component Value Date/Time   BNP 235.7 (H) 11/10/2019 1015    Inpatient  Medications  Scheduled Meds: . Chlorhexidine Gluconate Cloth  6 each Topical Daily  . folic acid  1 mg Oral Daily  . heparin  5,000 Units Subcutaneous Q8H  . lactulose  30 g Oral TID  . multivitamin with minerals  1 tablet Oral Daily  . pantoprazole  40 mg Oral QHS   Continuous Infusions: . ampicillin-sulbactam (UNASYN) IV Stopped (11/12/19 0713)  . lactated ringers Stopped (11/12/19 1056)  . potassium chloride 10 mEq (11/12/19 1056)  . thiamine injection 100 mL/hr at 11/12/19 1100   PRN Meds:.docusate sodium, ketorolac, morphine injection, polyethylene glycol  Micro Results Recent Results (from the past 240 hour(s))  Respiratory Panel by RT PCR (Flu A&B, Covid) - Nasopharyngeal Swab     Status: None   Collection Time: 11/10/19 10:15 AM   Specimen: Nasopharyngeal Swab  Result Value Ref Range Status   SARS Coronavirus 2 by RT PCR NEGATIVE NEGATIVE Final    Comment: (NOTE) SARS-CoV-2 target nucleic acids are NOT DETECTED. The SARS-CoV-2 RNA is generally detectable in upper respiratoy specimens during the acute phase of infection. The lowest concentration of SARS-CoV-2 viral copies this assay can detect is 131 copies/mL. A negative result does not preclude SARS-Cov-2 infection and should not be used as the  sole basis for treatment or other patient management decisions. A negative result may occur with  improper specimen collection/handling, submission of specimen other than nasopharyngeal swab, presence of viral mutation(s) within the areas targeted by this assay, and inadequate number of viral copies (<131 copies/mL). A negative result must be combined with clinical observations, patient history, and epidemiological information. The expected result is Negative. Fact Sheet for Patients:  PinkCheek.be Fact Sheet for Healthcare Providers:  GravelBags.it This test is not yet ap proved or cleared by the [place] FDA and   has been authorized for detection and/or diagnosis of SARS-CoV-2 by FDA under an Emergency Use Authorization (EUA). This EUA will remain  in effect (meaning this test can be used) for the duration of the COVID-19 declaration under Section 564(b)(1) of the Act, 21 U.S.C. section 360bbb-3(b)(1), unless the authorization is terminated or revoked sooner.    Influenza A by PCR NEGATIVE NEGATIVE Final   Influenza B by PCR NEGATIVE NEGATIVE Final    Comment: (NOTE) The Xpert Xpress SARS-CoV-2/FLU/RSV assay is intended as an aid in  the diagnosis of influenza from Nasopharyngeal swab specimens and  should not be used as a sole basis for treatment. Nasal washings and  aspirates are unacceptable for Xpert Xpress SARS-CoV-2/FLU/RSV  testing. Fact Sheet for Patients: PinkCheek.be Fact Sheet for Healthcare Providers: GravelBags.it This test is not yet approved or cleared by the [place] FDA and  has been authorized for detection and/or diagnosis of SARS-CoV-2 by  FDA under an Emergency Use Authorization (EUA). This EUA will remain  in effect (meaning this test can be used) for the duration of the  Covid-19 declaration under Section 564(b)(1) of the Act, 21  U.S.C. section 360bbb-3(b)(1), unless the authorization is  terminated or revoked. Performed at [facility], [place] [address] [place], [place] [address]   Blood Culture (routine x 2)     Status: None (Preliminary result)   Collection Time: 11/10/19 10:15 AM   Specimen: BLOOD RIGHT HAND  Result Value Ref Range Status   Specimen Description BLOOD RIGHT HAND  Final   Special Requests   Final    BOTTLES DRAWN AEROBIC AND ANAEROBIC Blood Culture results may not be optimal due to an inadequate volume of blood received in culture bottles   Culture   Final    NO GROWTH 1 DAY Performed at [facility], [place] [address] [place], [place] [address]    Report Status PENDING   Incomplete  Blood Culture (routine x 2)     Status: None (Preliminary result)   Collection Time: 11/10/19 10:15 AM   Specimen: BLOOD  Result Value Ref Range Status   Specimen Description BLOOD LEFT ANTECUBITAL  Final   Special Requests   Final    BOTTLES DRAWN AEROBIC ONLY Blood Culture results may not be optimal due to an inadequate volume of blood received in culture bottles   Culture   Final    NO GROWTH 1 DAY Performed at [facility], [place] [address], [place], [place] [address]    Report Status PENDING  Incomplete  Urine culture     Status: None   Collection Time: 11/10/19 11:05 AM   Specimen: In/Out Cath Urine  Result Value Ref Range Status   Specimen Description IN/OUT CATH URINE  Final   Special Requests NONE  Final   Culture   Final    NO GROWTH Performed at [facility], [place] [address], [place],  [address]  Report Status 11/11/2019 FINAL  Final  MRSA PCR Screening     Status: None   Collection Time: 11/10/19  1:31 PM   Specimen: Nasal Mucosa; Nasopharyngeal  Result Value Ref Range Status   MRSA by PCR NEGATIVE NEGATIVE Final    Comment:        The GeneXpert MRSA Assay (FDA approved for NASAL specimens only), is one component of a comprehensive MRSA colonization surveillance program. It is not intended to diagnose MRSA infection nor to guide or monitor treatment for MRSA infections. Performed at [facility], [place] [address] [place], [place] [address]   Culture, respiratory (tracheal aspirate)     Status: None (Preliminary result)   Collection Time: 11/11/19  8:38 AM   Specimen: Tracheal Aspirate; Respiratory  Result Value Ref Range Status   Specimen Description TRACHEAL ASPIRATE  Final   Special Requests NONE  Final   Gram Stain   Final    FEW WBC PRESENT,BOTH PMN AND MONONUCLEAR FEW GRAM POSITIVE COCCI IN PAIRS IN CLUSTERS Performed at [facility], [address], [place], [place] [address]    Culture PENDING  Incomplete    Report Status PENDING  Incomplete    Radiology Reports CT Head Wo Contrast  Result Date: 11/10/2019 CLINICAL DATA:  Altered mental status. EXAM: CT HEAD WITHOUT CONTRAST TECHNIQUE: Contiguous axial images were obtained from the base of the skull through the vertex without intravenous contrast. COMPARISON:  09/16/2019 FINDINGS: Brain: No evidence of acute infarction, hemorrhage, hydrocephalus, extra-axial collection or mass lesion/mass effect. Vascular: No hyperdense vessel or unexpected calcification. Skull: Normal. Negative for fracture or focal lesion. Sinuses/Orbits: Globes and orbits are unremarkable. Dependent fluid/secretions in the maxillary sinuses. Mucosal thickening lines the ethmoid, sphenoid and right maxillary sinus. Clear mastoid air cells. Other: None. IMPRESSION: 1. No intracranial abnormality. 2. Sinus disease with mucosal thickening. Maxillary sinuses with dependent fluid/secretions. Electronically Signed   By: Lajean Manes M.D.   On: 11/10/2019 12:34   DG Chest Port 1 View  Result Date: 11/11/2019 CLINICAL DATA:  54 year old female with history of pneumonia. EXAM: PORTABLE CHEST 1 VIEW COMPARISON:  Chest x-ray 11/10/2019. FINDINGS: An endotracheal tube is in place with tip 2.6 cm above the carina. A nasogastric tube is seen extending into the stomach, however, the tip of the nasogastric tube extends below the lower margin of the image. Transcutaneous defibrillator pad projecting over the lower left hemithorax. Patchy multifocal ill-defined opacities and areas of interstitial prominence are asymmetrically distributed in the lungs bilaterally, most severe in the right mid to lower lung and at the left lung base. Overall, aeration has significantly improved compared to the prior examination, particularly in the right upper and left upper lobes. No pneumothorax. No evidence of pulmonary edema. Heart size appears borderline enlarged. Upper mediastinal contours are within normal limits.  IMPRESSION: 1. Support apparatus, as above. 2. Multilobar pneumonia redemonstrated with significant improved aeration compared to the prior examination, as above. Electronically Signed   By: Vinnie Langton M.D.   On: 11/11/2019 09:33   DG Chest Port 1 View  Result Date: 11/10/2019 CLINICAL DATA:  CPR  ETT placement EXAM: PORTABLE CHEST 1 VIEW COMPARISON:  Chest radiograph 09/16/2019 FINDINGS: Interval intubation with endotracheal tube tip between the thoracic inlet and carina. There are extensive bilateral right greater than left interstitial and airspace opacities. No pneumothorax or large pleural effusion. Probable lower right rib fracture. IMPRESSION: 1. Interval intubation with endotracheal tube tip in appropriate position. 2. Extensive bilateral right  greater than left interstitial and airspace opacities, could represent pulmonary edema or aspiration. 3. Probable lower right rib fracture. Electronically Signed   By: Audie Pinto M.D.   On: 11/10/2019 10:27   DG Abd Portable 1V  Result Date: 11/10/2019 CLINICAL DATA:  Encounter for orogastric tube placement. EXAM: PORTABLE ABDOMEN - 1 VIEW COMPARISON:  CT abdomen/pelvis 02/23/2018 FINDINGS: Overlying cardiac monitoring leads. An enteric tube passes below the level of the left hemidiaphragm with tip terminating in the region of the stomach. No dilated loops of bowel are demonstrated to suggest small bowel obstruction mild air distention of the colon. No acute bony abnormality. IMPRESSION: Problem oriented examination demonstrates an enteric tube which passes below the level left hemidiaphragm with tip projecting in the region of the stomach. Electronically Signed   By: Kellie Simmering DO   On: 11/10/2019 14:00   EEG adult  Result Date: 11/10/2019 Lora Havens, MD     11/10/2019  4:42 PM Patient Name: ARETHA DORNAK MRN: [id number] Epilepsy Attending: Lora Havens Referring Physician/Provider: Salvadore Dom, NP Date: 11/10/2019 Duration:  24.14 mins Patient history: 54 year old female status post cardiac arrest.  EEG evaluate for seizures. Level of alertness: Comatose AEDs during EEG study: None Technical aspects: This EEG study was done with scalp electrodes positioned according to the 10-20 International system of electrode placement. Electrical activity was acquired at a sampling rate of 500Hz  and reviewed with a high frequency filter of 70Hz  and a low frequency filter of 1Hz . EEG data were recorded continuously and digitally stored. Description: EEG showed continuous generalized  3 to 5 Hz theta slowing. Reactivity was not tested during this study. Photic driving was not seen during photic stimulation. Hyperventilation was not performed. Abnormality -Continuous slow, generalized IMPRESSION: This study is suggestive of profound diffuse encephalopathy, non specific to encephalopathy. No seizures or epileptiform discharges were seen throughout the recording. Priyanka Reynolds Bowl Hally Colella M.D on 11/12/2019 at 11:32 AM  Between 7am to 7pm - Pager - [phone]  After 7pm go to [email or web address] - password [facility]  [facility] -  Office  [phone]

## 2019-11-12 NOTE — Evaluation (Signed)
Physical Therapy Evaluation Patient Details Name: Amy Villa MRN: [id number] DOB: Aug 07, 1965 Today's Date: 11/12/2019   History of Present Illness  54 year old woman with a history of alcohol use and cirrhosis, depression, hypertension.  She was found altered and poorly responsive by her husband around 0530, then completely unresponsive and pulseless at 0730.  CPR was started, patient was in asystole when EMS arrived.  The estimated time to ROSC was over 30 minutes.  Brought to the ED for further support and care ventilated and hemodynamically stabilized.  GCS on arrival 3. Pt was intubated from 4/23-4/24.  Clinical Impression  Patient presents with decreased independence and safety with mobility due to decreased strength, decreased activity tolerance, decreased balance, and decreased cognition.  Currently needs min to mod A for OOB to chair and was having difficulty with orientation and home questions.  Spouse works and pt would need to be mod I for basic skills.  Feel she will benefit from CIR level rehab upon d/c.  PT to follow acutely.     Follow Up Recommendations CIR;Supervision/Assistance - 24 hour    Equipment Recommendations  Other (comment)(TBA)    Recommendations for Other Services Rehab consult     Precautions / Restrictions Precautions Precautions: Fall Restrictions Weight Bearing Restrictions: No      Mobility  Bed Mobility Overal bed mobility: Needs Assistance Bed Mobility: Supine to Sit     Supine to sit: Mod assist;HOB elevated     General bed mobility comments: assist to lift trunk and scoot to EOB  Transfers Overall transfer level: Needs assistance Equipment used: 1 person hand held assist;2 person hand held assist Transfers: Sit to/from [facility] Sit to Stand: Min assist;+2 safety/equipment Stand pivot transfers: Min assist;+2 safety/equipment       General transfer comment: assist up on her feet and to step to recliner, +2  for safety/lines  Ambulation/Gait             General Gait Details: deferred due to weakness/fatigue  Stairs            Wheelchair Mobility    Modified Rankin (Stroke Patients Only)       Balance Overall balance assessment: Needs assistance Sitting-balance support: Feet supported Sitting balance-Leahy Scale: Fair     Standing balance support: Single extremity supported Standing balance-Leahy Scale: Poor Standing balance comment: general weakness needing UE support for balance                             Pertinent Vitals/Pain Pain Assessment: Faces Faces Pain Scale: Hurts even more Pain Location: generalized aches with mobility Pain Descriptors / Indicators: Grimacing;Moaning;Discomfort Pain Intervention(s): Monitored during session;Repositioned;Limited activity within patient's tolerance    Home Living Family/patient expects to be discharged to:: Private residence Living Arrangements: Spouse/significant other Available Help at Discharge: Family;Available PRN/intermittently Type of Home: House Home Access: Stairs to enter Entrance Stairs-Rails: Right;Left;Can reach both Entrance Stairs-Number of Steps: 3 Home Layout: One level Home Equipment: Shower seat - built in;Grab bars - tub/shower;Crutches;Other (comment)(knee walker)      Prior Function Level of Independence: Independent         Comments: spouse works     Journalist, newspaper        Extremity/Trunk Assessment   Upper Extremity Assessment Upper Extremity Assessment: Generalized weakness    Lower Extremity Assessment Lower Extremity Assessment: Generalized weakness       Communication   Communication: Expressive difficulties(word finding trouble)  Cognition Arousal/Alertness: Awake/alert Behavior During Therapy: Flat affect Overall Cognitive Status: Impaired/Different from baseline Area of Impairment: Orientation;Attention                 Orientation Level:  Disoriented to;Place;Time;Situation Current Attention Level: Sustained           General Comments: looking to her spouse when questioned about her home situation and reason for admission      General Comments General comments (skin integrity, edema, etc.): spouse present and supportive, trying to get pt to come up with what he does/where he works, but she could not, she reported felt like could not think of the words    Exercises     Assessment/Plan    PT Assessment Patient needs continued PT services  PT Problem List Decreased strength;Decreased mobility;Decreased cognition;Decreased safety awareness;Cardiopulmonary status limiting activity;Decreased knowledge of use of DME;Decreased balance       PT Treatment Interventions DME instruction;Therapeutic activities;Balance training;Cognitive remediation;Stair training;Gait training;Functional mobility training;Therapeutic exercise;Patient/family education    PT Goals (Current goals can be found in the Care Plan section)  Acute Rehab PT Goals Patient Stated Goal: to return to independent PT Goal Formulation: With patient/family Time For Goal Achievement: 11/26/19 Potential to Achieve Goals: Good    Frequency Min 3X/week   Barriers to discharge        Co-evaluation               AM-PAC PT "6 Clicks" Mobility  Outcome Measure Help needed turning from your back to your side while in a flat bed without using bedrails?: A Little Help needed moving from lying on your back to sitting on the side of a flat bed without using bedrails?: A Little Help needed moving to and from a bed to a chair (including a wheelchair)?: A Little Help needed standing up from a chair using your arms (e.g., wheelchair or bedside chair)?: A Little Help needed to walk in hospital room?: A Lot Help needed climbing 3-5 steps with a railing? : Total 6 Click Score: 15    End of Session   Activity Tolerance: Patient tolerated treatment well Patient  left: in chair;with call bell/phone within reach Nurse Communication: Mobility status PT Visit Diagnosis: Other abnormalities of gait and mobility (R26.89);Muscle weakness (generalized) (M62.81);Other symptoms and signs involving the nervous system (R29.898)    Time: [id number] PT Time Calculation (min) (ACUTE ONLY): 24 min   Charges:   PT Evaluation $PT Eval Moderate Complexity: 1 Mod PT Treatments $Therapeutic Activity: 8-22 mins        Magda Kiel, [place] [facility] [phone] 11/12/2019   Reginia Naas 11/12/2019, 4:17 PM

## 2019-11-12 NOTE — Progress Notes (Signed)
Modified Barium Swallow Progress Note  Patient Details  Name: Amy Villa MRN: [id number] Date of Birth: 11-02-1965  Today's Date: 11/12/2019  Modified Barium Swallow completed.  Full report located under Chart Review in the Imaging Section.  Brief recommendations include the following:  Clinical Impression  Patient presents with a mild pharyngeal phase dysphagia characterized primarily by decreased laryngeal closure s/p brief intubation resulting in deep penetration to the vocal cords without sensation, of thin liquids. This occurred consistently with use of straw which aided in larger bolus sizes, eliminated with small single cup sips. Otherwise oral phase timely and patient with full pharyngeal and upper esophageal clearance of bolus. It is approprioate to initiate a po diet at this time with aspiration precautions to mitigate risks.     Swallow Evaluation Recommendations       SLP Diet Recommendations: Regular solids;Thin liquid   Liquid Administration via: Cup;No straw   Medication Administration: Whole meds with puree   Supervision: Patient able to self feed;Full supervision/cueing for compensatory strategies   Compensations: Slow rate;Small sips/bites   Postural Changes: Seated upright at 90 degrees   Oral Care Recommendations: Oral care BID       Azlan Hanway MA, CCC-SLP   Lissette Schenk Meryl 11/12/2019,12:49 PM

## 2019-11-12 NOTE — Progress Notes (Signed)
  Speech Language Pathology Treatment: Dysphagia  Patient Details Name: Amy Villa MRN: [id number] DOB: 1966/04/01 Today's Date: 11/12/2019 Time: 0910-0920 SLP Time Calculation (min) (ACUTE ONLY): 10 min  Assessment / Plan / Recommendation Clinical Impression  Diagnostic treatment complete with focus on readiness for pos and/or instrumental exam based on initial evaluation 4/24. Patient alert, cooperative. Vocal quality mildly hoarse but overall appears to be improving as compared to previous day. Relatively consistent congested cough noted at baseline making bedside diagnostics challenging. Patient able to consume pureed solid with no obvious s/s of aspiration however immediate cough response noted following thin liquids. Again, difficult to determine if baseline or related to decreased airway protection. In light of recent intubation, recommend proceeding with instrumental exam to evaluate swallowing physiology in hopes of returning patient to a po diet.   Per RN, patient is in need of one po med ASAP. May provide necessary po med either crushed or dissolved in pureed solid prior to MBS, otherwise NPO until exam.    HPI HPI: 54 year old woman with a history of alcohol use and cirrhosis, depression, hypertension.  She was found altered and poorly responsive by her husband around 0530, then completely unresponsive and pulseless at 0730.  CPR was started, patient was in asystole when EMS arrived.  The estimated time to ROSC was over 30 minutes.  Brought to the ED for further support and care ventilated and hemodynamically stabilized.  GCS on arrival 3. Pt was intubated from 4/23-4/24.        SLP Plan  MBS       Recommendations  Diet recommendations: NPO Medication Administration: Via alternative means                Oral Care Recommendations: Oral care QID;Staff/trained caregiver to provide oral care Follow up Recommendations: (TBD) SLP Visit Diagnosis: Dysphagia, unspecified  (R13.10) Plan: MBS                   Amy Brothers MA, CCC-SLP     Amy Villa Amy Villa 11/12/2019, 9:25 AM

## 2019-11-12 NOTE — Progress Notes (Signed)
Rehab Admissions Coordinator Note:  Patient was screened by Cleatrice Burke for appropriateness for an Inpatient Acute Rehab Consult per therapy recs. .  At this time, we are recommending Inpatient Rehab consult. I will place order per protocol.  Cleatrice Burke RN MSN 11/12/2019, 7:13 PM  I can be reached at [phone].

## 2019-11-13 ENCOUNTER — Inpatient Hospital Stay (HOSPITAL_COMMUNITY): Payer: BLUE CROSS/BLUE SHIELD

## 2019-11-13 DIAGNOSIS — I469 Cardiac arrest, cause unspecified: Secondary | ICD-10-CM

## 2019-11-13 LAB — GLUCOSE, CAPILLARY
Glucose-Capillary: 112 mg/dL — ABNORMAL HIGH (ref 70–99)
Glucose-Capillary: 113 mg/dL — ABNORMAL HIGH (ref 70–99)
Glucose-Capillary: 121 mg/dL — ABNORMAL HIGH (ref 70–99)
Glucose-Capillary: 151 mg/dL — ABNORMAL HIGH (ref 70–99)
Glucose-Capillary: 163 mg/dL — ABNORMAL HIGH (ref 70–99)
Glucose-Capillary: 181 mg/dL — ABNORMAL HIGH (ref 70–99)

## 2019-11-13 LAB — BASIC METABOLIC PANEL
Anion gap: 13 (ref 5–15)
BUN: 5 mg/dL — ABNORMAL LOW (ref 6–20)
CO2: 22 mmol/L (ref 22–32)
Calcium: 8.7 mg/dL — ABNORMAL LOW (ref 8.9–10.3)
Chloride: 106 mmol/L (ref 98–111)
Creatinine, Ser: 0.59 mg/dL (ref 0.44–1.00)
GFR calc Af Amer: >60 mL/min (ref >60)
GFR calc non Af Amer: >60 mL/min (ref >60)
Glucose, Bld: 124 mg/dL — ABNORMAL HIGH (ref 70–99)
Potassium: 3.4 mmol/L — ABNORMAL LOW (ref 3.5–5.1)
Sodium: 141 mmol/L (ref 135–145)

## 2019-11-13 LAB — AMMONIA: Ammonia: 38 umol/L — ABNORMAL HIGH (ref 9–35)

## 2019-11-13 LAB — ECHOCARDIOGRAM COMPLETE
Height: 63 in
Weight: 2204.8 oz

## 2019-11-13 LAB — HEPATIC FUNCTION PANEL
ALT: 154 U/L — ABNORMAL HIGH (ref 0–44)
AST: 89 U/L — ABNORMAL HIGH (ref 15–41)
Albumin: 3.4 g/dL — ABNORMAL LOW (ref 3.5–5.0)
Alkaline Phosphatase: 153 U/L — ABNORMAL HIGH (ref 38–126)
Bilirubin, Direct: 0.3 mg/dL — ABNORMAL HIGH (ref 0.0–0.2)
Indirect Bilirubin: 1.4 mg/dL — ABNORMAL HIGH (ref 0.3–0.9)
Total Bilirubin: 1.7 mg/dL — ABNORMAL HIGH (ref 0.3–1.2)
Total Protein: 6.7 g/dL (ref 6.5–8.1)

## 2019-11-13 LAB — CBC
HCT: 34.8 % — ABNORMAL LOW (ref 36.0–46.0)
Hemoglobin: 10.8 g/dL — ABNORMAL LOW (ref 12.0–15.0)
MCH: 26.9 pg (ref 26.0–34.0)
MCHC: 31 g/dL (ref 30.0–36.0)
MCV: 86.6 fL (ref 80.0–100.0)
Platelets: 103 10*3/uL — ABNORMAL LOW (ref 150–400)
RBC: 4.02 MIL/uL (ref 3.87–5.11)
RDW: 22 % — ABNORMAL HIGH (ref 11.5–15.5)
WBC: 9.4 10*3/uL (ref 4.0–10.5)
nRBC: 0 % (ref 0.0–0.2)

## 2019-11-13 MED ORDER — ENSURE ENLIVE PO LIQD
237.0000 mL | Freq: Three times a day (TID) | ORAL | Status: DC
  Administered 2019-11-13 – 2019-11-15 (×6): 237 mL via ORAL

## 2019-11-13 MED ORDER — PROPRANOLOL HCL 20 MG PO TABS
20.0000 mg | ORAL_TABLET | Freq: Two times a day (BID) | ORAL | Status: DC
  Administered 2019-11-13 – 2019-11-15 (×5): 20 mg via ORAL
  Filled 2019-11-13 (×6): qty 1

## 2019-11-13 MED ORDER — POTASSIUM CHLORIDE CRYS ER 20 MEQ PO TBCR
40.0000 meq | EXTENDED_RELEASE_TABLET | Freq: Four times a day (QID) | ORAL | Status: AC
  Administered 2019-11-13 (×2): 40 meq via ORAL
  Filled 2019-11-13 (×2): qty 2

## 2019-11-13 MED ORDER — METHYLPREDNISOLONE SODIUM SUCC 40 MG IJ SOLR
40.0000 mg | Freq: Two times a day (BID) | INTRAMUSCULAR | Status: DC
  Administered 2019-11-13 – 2019-11-14 (×2): 40 mg via INTRAVENOUS
  Filled 2019-11-13 (×2): qty 1

## 2019-11-13 MED ORDER — GUAIFENESIN ER 600 MG PO TB12
1200.0000 mg | ORAL_TABLET | Freq: Two times a day (BID) | ORAL | Status: DC
  Administered 2019-11-13 – 2019-11-15 (×5): 1200 mg via ORAL
  Filled 2019-11-13 (×5): qty 2

## 2019-11-13 MED ORDER — PERFLUTREN LIPID MICROSPHERE
1.0000 mL | INTRAVENOUS | Status: AC | PRN
  Administered 2019-11-13: 08:00:00 2 mL via INTRAVENOUS
  Filled 2019-11-13: qty 10

## 2019-11-13 MED ORDER — HYDRALAZINE HCL 20 MG/ML IJ SOLN
5.0000 mg | INTRAMUSCULAR | Status: DC | PRN
  Administered 2019-11-13: 5 mg via INTRAVENOUS
  Filled 2019-11-13: qty 1

## 2019-11-13 MED ORDER — PROPRANOLOL HCL 20 MG PO TABS
20.0000 mg | ORAL_TABLET | Freq: Two times a day (BID) | ORAL | Status: DC
  Filled 2019-11-13: qty 1

## 2019-11-13 NOTE — Progress Notes (Signed)
Occupational Therapy Evaluation Patient Details Name: Amy Villa MRN: [id number] DOB: 05-31-1966 Today's Date: 11/13/2019    History of Present Illness 54 year old woman with a history of alcohol use and cirrhosis, depression, hypertension.  She was found altered and poorly responsive by her husband around 0530, then completely unresponsive and pulseless at 0730.  CPR was started, patient was in asystole when EMS arrived.  The estimated time to ROSC was over 30 minutes.  Brought to the ED for further support and care ventilated and hemodynamically stabilized.  GCS on arrival 3. Pt was intubated from 4/23-4/24.   Clinical Impression   PTA pt lived with her husband, independent in all ADL, IADL, and mobility tasks. Pt does not ambulate with an assistive device and reports 1 fall in the last 6 months. Pt currently requires setup to min assist for self-care and functional transfer tasks. Educated pt on safety strategies and activity pacing techniques to increase balance and prevent future falls with fair understanding and follow through. Pt able to ambulate to/from bathroom, complete toileting task, and tolerate standing ~5 min at the sink to complete grooming/hygiene tasks. Pt demonstrates decreased strength, endurance, balance, standing tolerance, activity tolerance, and safety awareness impacting ability to complete self-care and functional transfer tasks. Recommend skilled OT services to address above deficits in order to promote function and prevent further decline. Recommend CIR placement for additional rehab prior to discharge home.     Follow Up Recommendations  CIR;Supervision/Assistance - 24 hour for short stay. May progress to [facility] OT and 24/7 supervision.    Equipment Recommendations  Other (comment)(TBD at next venue of care)    Recommendations for Other Services       Precautions / Restrictions Precautions Precautions: Fall Restrictions Weight Bearing Restrictions: No       Mobility Bed Mobility               General bed mobility comments: Pt seated EOB upon OT arrival.   Transfers Overall transfer level: Needs assistance Equipment used: None Transfers: Sit to/from Stand Sit to Stand: Min guard;Min assist         General transfer comment: Assist to stedy self upon standing. Cues for safety and pacing.     Balance Overall balance assessment: Needs assistance Sitting-balance support: Feet supported Sitting balance-Leahy Scale: Good       Standing balance-Leahy Scale: Fair                             ADL either performed or assessed with clinical judgement   ADL Overall ADL's : Needs assistance/impaired Eating/Feeding: Set up;Sitting   Grooming: Wash/dry hands;Wash/dry face;Oral care;Brushing hair;Min guard Grooming Details (indicate cue type and reason): While standing at the sink to ensure balance and safety.  Upper Body Bathing: Minimal assistance;Sitting   Lower Body Bathing: Minimal assistance;Sit to/from stand   Upper Body Dressing : Minimal assistance;Sitting   Lower Body Dressing: Minimal assistance;Sit to/from stand Lower Body Dressing Details (indicate cue type and reason): for balance and safety Toilet Transfer: Min guard;Regular Toilet;Grab bars;Ambulation   Toileting- Clothing Manipulation and Hygiene: Min guard;Sit to/from stand       Functional mobility during ADLs: Min guard;Minimal assistance General ADL Comments: Pt able to ambulate to/from bathroom without an AD requiring variable min guard to min assist for balance. Pt tolerated standing ~5 min at the sink to complete grooming/hygiene tasks.      Vision Baseline Vision/History: (Wears contacts) Wears Glasses: At  all times       Perception     Praxis      Pertinent Vitals/Pain Pain Assessment: No/denies pain     Hand Dominance Right   Extremity/Trunk Assessment Upper Extremity Assessment Upper Extremity Assessment: Generalized  weakness   Lower Extremity Assessment Lower Extremity Assessment: Defer to PT evaluation       Communication Communication Communication: Other (comment);Expressive difficulties(word finding difficulties)   Cognition Arousal/Alertness: Awake/alert Behavior During Therapy: WFL for tasks assessed/performed Overall Cognitive Status: Impaired/Different from baseline Area of Impairment: Orientation;Memory                 Orientation Level: Disoriented to;Place   Memory: Decreased short-term memory         General Comments: Pt able to follow commands. Difficulty recalling from past, looking to spouse for answers.    General Comments  No signs/symptoms of distress. Pt's husband and son present for session.     Exercises     Shoulder Instructions      Home Living Family/patient expects to be discharged to:: Private residence Living Arrangements: Spouse/significant other Available Help at Discharge: Family;Available PRN/intermittently Type of Home: House Home Access: Stairs to enter [employer] of Steps: 3 Entrance Stairs-Rails: Right;Left;Can reach both Home Layout: One level     Bathroom Shower/Tub: Occupational psychologist: Standard     Home Equipment: Shower seat - built in;Grab bars - tub/shower;Crutches;Other (comment)(knee walker)   Additional Comments: Pt's husband and son present for session to assist with answering PLOF.       Prior Functioning/Environment Level of Independence: Independent        Comments: Pt independent in all ADL, IADLs, and mobility. Pt does not ambulate with an assistive device. Pt has had 1 fall in the last 6 months.         OT Problem List: Decreased strength;Decreased activity tolerance;Impaired balance (sitting and/or standing);Decreased cognition;Decreased safety awareness      OT Treatment/Interventions: Self-care/ADL training;Therapeutic exercise;Neuromuscular education;Energy conservation;DME  and/or AE instruction;Therapeutic activities;Cognitive remediation/compensation;Patient/family education;Balance training    OT Goals(Current goals can be found in the care plan section) Acute Rehab OT Goals Patient Stated Goal: to go home Time For Goal Achievement: 11/27/19 Potential to Achieve Goals: Good ADL Goals Pt Will Perform Tub/Shower Transfer: Shower transfer;with modified independence;grab bars Additional ADL Goal #1: Pt to recall and verbalize 3 fall prevention strategies with 0 verbal cues. Additional ADL Goal #2: Pt to complete all ADLs independently with 0 instances of LOB. Additional ADL Goal #3: Pt to complete higher level IADL task with supervision (i.e. item retrieval, bed making). Additional ADL Goal #4: Pt to tolerate standing up to 10 min independently, in preparation for ADLs.  OT Frequency: Min 2X/week   Barriers to D/C:            Co-evaluation              AM-PAC OT "6 Clicks" Daily Activity     Outcome Measure Help from another person eating meals?: A Little Help from another person taking care of personal grooming?: A Little Help from another person toileting, which includes using toliet, bedpan, or urinal?: A Little Help from another person bathing (including washing, rinsing, drying)?: A Little Help from another person to put on and taking off regular upper body clothing?: A Little Help from another person to put on and taking off regular lower body clothing?: A Little 6 Click Score: 18   End of Session Equipment Utilized During  Treatment: Gait belt Nurse Communication: Mobility status  Activity Tolerance: Patient tolerated treatment well Patient left: in bed;with call bell/phone within reach;with nursing/sitter in room  OT Visit Diagnosis: Unsteadiness on feet (R26.81);Muscle weakness (generalized) (M62.81)                Time: [id number] OT Time Calculation (min): 30 min Charges:  OT General Charges $OT Visit: 1 Visit OT Evaluation $OT  Eval Low Complexity: 1 Low OT Treatments $Self Care/Home Management : 8-22 mins  Mauri Brooklyn OTR/L [phone]  Mauri Brooklyn 11/13/2019, 12:26 PM

## 2019-11-13 NOTE — Progress Notes (Signed)
PROGRESS NOTE                                                                                                                                                                                                             Patient Demographics:    Amy Villa, is a 54 y.o. female, DOB - [birth year], [id number]  Admit date - 11/10/2019   Admitting Physician Collene Gobble, MD  Outpatient Primary MD for the patient is Patient, No Pcp Per  LOS - 3   Chief Complaint  Patient presents with  . CPR       Brief Narrative    54 year old female with PMH of Former tobacco abuse,, Anxiety/depression, insomnia, alcoholic related cirrhosis of the liver with esophageal varices, COPD, gastroesophageal reflux disease without esophagitis, hypertension, iron deficiency anemia , with Recent PCP visit requiring Seroquel refill while searching for new psychiatrist. Was in normal state of healthon 4/22. Husband sleeps in another room. Said he saw her light on about 2am (a common occurrence when she can't sleep), he checked on her about 530 am, at that time she was altered (slow to respond, sleepy but would sit up in bed), he said she had done this before (eluding to self medicating with her meds) and left the room. He again went to check on her he estimated 2 hrs later, Husband was not able to wake her up this morning, found her pulseless and not breathing. He initiated CPR, EMS called, asystole on arrival, time to return of spontaneous circulation estimated at over 30 minutes, she was admitted to ICU, where she was encephalopathic required intubation, mentation has improved, where she was successfully extubated 4/24, and transferred to triad care on 4/25.     Subjective:    [name] today is awake, confused, she was trying to get out of bed yesterday shows she has a sitter at bedside .    Assessment  & Plan :    Principal Problem:   Cardiac arrest  [facility]) Active Problems:   Anemia iron def   Liver cirrhosis (HCC)   Anxiety and depression   Substance abuse (HCC)   Coagulopathy (HCC)   Acute metabolic encephalopathy   Acute respiratory failure with hypoxemia (HCC)   Aspiration pneumonia (HCC)   AKI (acute kidney injury) (HCC)   Lactic acidosis   Hyperkalemia  Hypothermia   Cardiopulmonary arrest, asystole initial rhythm. -  Etiology unclear, but some polypharmacy likely contributing, as she was recently started on Seroquel, on top of multiple psychiatric medication including BuSpar, Flexeril, Prozac, Neurontin. -Echo showing preserved EF 50 to 55% -Continue to monitor on telemetry.  No significant events on telemetry  Acute hypoxic respiratory failure following cardiopulmonary arrest,  complicated byAspiration pneumoniaand probable right lower rib fracture as a consequence of CPR -She was successfully extubated 4/24, currently on nasal cannula. -Continue with IV Unasyn.  Acute metabolic encephalopathy -Condition is improving, she is following commands, but certainly not back at baseline. - Alert and following commands this AM - CT head negative. EEG with diffuse encephalopathy  - Continue lactulose, ammonia level at 38, it was elevated on admission - Continue holding buspar, flexeril, prozac, and neurontin for now - SLP to evaluate for dysphagia.  Started on regular diet -Unclear so far if she has sustained any anoxic brain injury from her initial arrest, but so far mentation appears to be improving. -She would certainly benefit from CIR, they were consulted.  aspiration pneumonia - Continue Unasyn as outlined above  - Follow cultures.  - Follow fever curve and trend WBC  History of alcohol related liver cirrhosis without ascites but with history of esophageal varices Mild coagulopathy  -NR level mildly elevated at 1.3 - Continue lactulose , ammonia of 122 on admission, improved to 38 today - elevated LFTs most  likely related cardiac arrest, will continue to trend -Patient had endoscopy, and liver ultrasound done in the past showing some evidence of cirrhosis and low-grade varices. -Resume back on propranolol, especially with high blood pressure and tachycardia. -Continue with lactulose.  Hx anxiety and depression - Continue holding buspar, flexeril, prozac, and neurontin for now  History of gastroesophageal reflux disease - PPI  History of iron deficiency anemia - Trend cbc  Hypokalemia -Repletred   COVID-19 Labs  No results for input(s): DDIMER, FERRITIN, LDH, CRP in the last 72 hours.  Lab Results  Component Value Date   SARSCOV2NAA NEGATIVE 11/10/2019   [place] NEGATIVE 09/16/2019     Code Status : Full  Family Communication  : D/W husband via phone 4/25, left voicemail 4/26  Disposition Plan  :  Status is: Inpatient  Remains inpatient appropriate because:Hemodynamically unstable and IV treatments appropriate due to intensity of illness or inability to take PO   Dispo: The patient is from: CIR              Anticipated d/c is to: To be seen by PT               Anticipated d/c date is: > 3 days              Patient currently is not medically stable to d/c.   Consults  :   PCCM  Procedures  :  None  DVT Prophylaxis  :  [place] heparin  Lab Results  Component Value Date   PLT 103 (L) 11/13/2019    Antibiotics  :    Anti-infectives (From admission, onward)   Start     Dose/Rate Route Frequency Ordered Stop   11/11/19 1200  Ampicillin-Sulbactam (UNASYN) 3 g in sodium chloride 0.9 % 100 mL IVPB     3 g 200 mL/hr over 30 Minutes Intravenous Every 6 hours 11/11/19 0738     11/10/19 1800  ampicillin-sulbactam (UNASYN) 1.5 g in sodium chloride 0.9 % 100 mL IVPB  Status:  Discontinued  1.5 g 200 mL/hr over 30 Minutes Intravenous Every 6 hours 11/10/19 1209 11/11/19 0738   11/10/19 1100  vancomycin (VANCOREADY) IVPB 1250 mg/250 mL     1,250 mg 166.7 mL/hr  over 90 Minutes Intravenous  Once 11/10/19 1019 11/10/19 1543   11/10/19 1015  ceFEPIme (MAXIPIME) 2 g in sodium chloride 0.9 % 100 mL IVPB     2 g 200 mL/hr over 30 Minutes Intravenous  Once 11/10/19 1000 11/10/19 1127   11/10/19 1015  metroNIDAZOLE (FLAGYL) IVPB 500 mg     500 mg 100 mL/hr over 60 Minutes Intravenous  Once 11/10/19 1000 11/10/19 1158   11/10/19 1015  vancomycin (VANCOCIN) IVPB 1000 mg/200 mL premix  Status:  Discontinued     1,000 mg 200 mL/hr over 60 Minutes Intravenous  Once 11/10/19 1000 11/10/19 1019        Objective:   Vitals:   11/13/19 0300 11/13/19 0445 11/13/19 0500 11/13/19 0715  BP:  (!) 190/99  (!) 171/108  Pulse:  93    Resp: (!) 21 19 17 17   Temp:  98.8 F (37.1 C)    TempSrc:  Oral    SpO2:  97%    Weight:      Height:        Wt Readings from Last 3 Encounters:  11/13/19 62.5 kg  09/16/19 62.1 kg  04/12/18 64 kg     Intake/Output Summary (Last 24 hours) at 11/13/2019 1434 Last data filed at 11/13/2019 0930 Gross per 24 hour  Intake 1756.46 ml  Output 300 ml  Net 1456.46 ml     Physical Exam  Awake Alert, vented x1, answering some questions appropriately, but certainly she remains significantly confused . symmetrical Chest wall movement, Good air movement bilaterally, CTAB RRR,No Gallops,Rubs or new Murmurs, No Parasternal Heave +ve B.Sounds, Abd Soft, No tenderness, No rebound - guarding or rigidity. No Cyanosis, Clubbing or edema, No new Rash or bruise       Data Review:    CBC Recent Labs  Lab 11/10/19 1015 11/10/19 1213 11/10/19 1321 11/10/19 1321 11/10/19 1611 11/11/19 0541 11/11/19 0607 11/12/19 0348 11/13/19 0524  WBC 36.4*  --  27.8*  --   --  14.4*  --  9.6 9.4  HGB 12.9   < > 13.7   < > 14.3 10.7* 11.6* 10.4* 10.8*  HCT 46.9*   < > 46.9*   < > 42.0 35.6* 34.0* 32.9* 34.8*  PLT 191  --  154  --   --  126*  --  102* 103*  MCV 98.5  --  91.8  --   --  89.0  --  85.0 86.6  MCH 27.1  --  26.8  --   --  26.8   --  26.9 26.9  MCHC 27.5*  --  29.2*  --   --  30.1  --  31.6 31.0  RDW 22.5*  --  22.5*  --   --  22.2*  --  22.3* 22.0*  LYMPHSABS 3.2  --   --   --   --   --   --   --   --   MONOABS 1.9*  --   --   --   --   --   --   --   --   EOSABS 0.2  --   --   --   --   --   --   --   --   BASOSABS 0.4*  --   --   --   --   --   --   --   --    < > =  values in this interval not displayed.    Chemistries  Recent Labs  Lab 11/10/19 1015 11/10/19 1213 11/11/19 0541 11/11/19 0541 11/11/19 0607 11/11/19 0716 11/11/19 1654 11/12/19 0535 11/12/19 1910 11/13/19 0524  NA 134*   < > 138   < > 140  --  143 141 138 141  K 5.4*   < > 3.9   < > 3.8  --  3.4* 2.9* 3.6 3.4*  CL 103   < > 108  --   --   --  110 103 101 106  CO2 12*   < > 22  --   --   --  24 27 24 22   GLUCOSE 136*   < > 133*  --   --   --  99 118* 102* 124*  BUN 18   < > 12  --   --   --  8 <5* <5* 5*  CREATININE 1.67*   < > 0.83  --   --   --  0.49 0.57 0.57 0.59  CALCIUM 8.2*   < > 8.3*  --   --   --  8.5* 8.7* 8.9 8.7*  MG  --   --  2.0  --   --   --   --  1.7  --   --   AST 318*  --   --   --   --  341*  --   --   --  89*  ALT 252*  --   --   --   --  294*  --   --   --  154*  ALKPHOS 173*  --   --   --   --  105  --   --   --  153*  BILITOT 0.7  --   --   --   --  0.7  --   --   --  1.7*   < > = values in this interval not displayed.   ------------------------------------------------------------------------------------------------------------------ No results for input(s): CHOL, HDL, LDLCALC, TRIG, CHOLHDL, LDLDIRECT in the last 72 hours.  Lab Results  Component Value Date   HGBA1C 6.0 11/30/2016   ------------------------------------------------------------------------------------------------------------------ No results for input(s): TSH, T4TOTAL, T3FREE, THYROIDAB in the last 72 hours.  Invalid input(s):  FREET3 ------------------------------------------------------------------------------------------------------------------ No results for input(s): VITAMINB12, FOLATE, FERRITIN, TIBC, IRON, RETICCTPCT in the last 72 hours.  Coagulation profile Recent Labs  Lab 11/10/19 1015 11/11/19 0716  INR 1.7* 1.3*    No results for input(s): DDIMER in the last 72 hours.  Cardiac Enzymes No results for input(s): CKMB, TROPONINI, MYOGLOBIN in the last 168 hours.  Invalid input(s): CK ------------------------------------------------------------------------------------------------------------------    Component Value Date/Time   BNP 235.7 (H) 11/10/2019 1015    Inpatient Medications  Scheduled Meds: . Chlorhexidine Gluconate Cloth  6 each Topical Daily  . feeding supplement (ENSURE ENLIVE)  237 mL Oral TID BM  . folic acid  1 mg Oral Daily  . guaiFENesin  1,200 mg Oral BID  . heparin  5,000 Units Subcutaneous Q8H  . lactulose  30 g Oral TID  . methylPREDNISolone (SOLU-MEDROL) injection  40 mg Intravenous Q12H  . multivitamin with minerals  1 tablet Oral Daily  . pantoprazole  40 mg Oral QHS  . propranolol  20 mg Oral BID   Continuous Infusions: . sodium chloride 250 mL (11/12/19 2143)  . ampicillin-sulbactam (UNASYN) IV 3 g (11/13/19 1128)  . lactated ringers 75 mL/hr at 11/13/19 1126  .  thiamine injection 250 mg (11/13/19 1015)   PRN Meds:.sodium chloride, docusate sodium, hydrALAZINE, ketorolac, morphine injection, polyethylene glycol  Micro Results Recent Results (from the past 240 hour(s))  Respiratory Panel by RT PCR (Flu A&B, Covid) - Nasopharyngeal Swab     Status: None   Collection Time: 11/10/19 10:15 AM   Specimen: Nasopharyngeal Swab  Result Value Ref Range Status   SARS Coronavirus 2 by RT PCR NEGATIVE NEGATIVE Final    Comment: (NOTE) SARS-CoV-2 target nucleic acids are NOT DETECTED. The SARS-CoV-2 RNA is generally detectable in upper respiratoy specimens during  the acute phase of infection. The lowest concentration of SARS-CoV-2 viral copies this assay can detect is 131 copies/mL. A negative result does not preclude SARS-Cov-2 infection and should not be used as the sole basis for treatment or other patient management decisions. A negative result may occur with  improper specimen collection/handling, submission of specimen other than nasopharyngeal swab, presence of viral mutation(s) within the areas targeted by this assay, and inadequate number of viral copies (<131 copies/mL). A negative result must be combined with clinical observations, patient history, and epidemiological information. The expected result is Negative. Fact Sheet for Patients:  PinkCheek.be Fact Sheet for Healthcare Providers:  GravelBags.it This test is not yet ap proved or cleared by the [place] FDA and  has been authorized for detection and/or diagnosis of SARS-CoV-2 by FDA under an Emergency Use Authorization (EUA). This EUA will remain  in effect (meaning this test can be used) for the duration of the COVID-19 declaration under Section 564(b)(1) of the Act, 21 U.S.C. section 360bbb-3(b)(1), unless the authorization is terminated or revoked sooner.    Influenza A by PCR NEGATIVE NEGATIVE Final   Influenza B by PCR NEGATIVE NEGATIVE Final    Comment: (NOTE) The Xpert Xpress SARS-CoV-2/FLU/RSV assay is intended as an aid in  the diagnosis of influenza from Nasopharyngeal swab specimens and  should not be used as a sole basis for treatment. Nasal washings and  aspirates are unacceptable for Xpert Xpress SARS-CoV-2/FLU/RSV  testing. Fact Sheet for Patients: PinkCheek.be Fact Sheet for Healthcare Providers: GravelBags.it This test is not yet approved or cleared by the [place] FDA and  has been authorized for detection and/or diagnosis of  SARS-CoV-2 by  FDA under an Emergency Use Authorization (EUA). This EUA will remain  in effect (meaning this test can be used) for the duration of the  Covid-19 declaration under Section 564(b)(1) of the Act, 21  U.S.C. section 360bbb-3(b)(1), unless the authorization is  terminated or revoked. Performed at [facility], [place] [address], [place], [place] [address]   Blood Culture (routine x 2)     Status: None (Preliminary result)   Collection Time: 11/10/19 10:15 AM   Specimen: BLOOD RIGHT HAND  Result Value Ref Range Status   Specimen Description BLOOD RIGHT HAND  Final   Special Requests   Final    BOTTLES DRAWN AEROBIC AND ANAEROBIC Blood Culture results may not be optimal due to an inadequate volume of blood received in culture bottles   Culture   Final    NO GROWTH 3 DAYS Performed at [facility], [place] [address], [place], [place] [address]    Report Status PENDING  Incomplete  Blood Culture (routine x 2)     Status: None (Preliminary result)   Collection Time: 11/10/19 10:15 AM   Specimen: BLOOD  Result Value Ref Range Status   Specimen Description BLOOD LEFT ANTECUBITAL  Final  Special Requests   Final    BOTTLES DRAWN AEROBIC ONLY Blood Culture results may not be optimal due to an inadequate volume of blood received in culture bottles   Culture   Final    NO GROWTH 3 DAYS Performed at [facility], [place] [address] [place], [place] [address]    Report Status PENDING  Incomplete  Urine culture     Status: None   Collection Time: 11/10/19 11:05 AM   Specimen: In/Out Cath Urine  Result Value Ref Range Status   Specimen Description IN/OUT CATH URINE  Final   Special Requests NONE  Final   Culture   Final    NO GROWTH Performed at [facility], [place] [address] [place], [place] [address]    Report Status 11/11/2019 FINAL  Final  MRSA PCR Screening     Status: None   Collection Time: 11/10/19  1:31 PM   Specimen: Nasal Mucosa; Nasopharyngeal   Result Value Ref Range Status   MRSA by PCR NEGATIVE NEGATIVE Final    Comment:        The GeneXpert MRSA Assay (FDA approved for NASAL specimens only), is one component of a comprehensive MRSA colonization surveillance program. It is not intended to diagnose MRSA infection nor to guide or monitor treatment for MRSA infections. Performed at [facility], [place] [address], [place], [place] [address]   Culture, respiratory (tracheal aspirate)     Status: None (Preliminary result)   Collection Time: 11/11/19  8:38 AM   Specimen: Tracheal Aspirate; Respiratory  Result Value Ref Range Status   Specimen Description TRACHEAL ASPIRATE  Final   Special Requests NONE  Final   Gram Stain   Final    FEW WBC PRESENT,BOTH PMN AND MONONUCLEAR FEW GRAM POSITIVE COCCI IN PAIRS IN CLUSTERS    Culture   Final    FEW YEAST CULTURE REINCUBATED FOR BETTER GROWTH Performed at [facility], [place] [address] [place], [place] [address]    Report Status PENDING  Incomplete    Radiology Reports CT Head Wo Contrast  Result Date: 11/10/2019 CLINICAL DATA:  Altered mental status. EXAM: CT HEAD WITHOUT CONTRAST TECHNIQUE: Contiguous axial images were obtained from the base of the skull through the vertex without intravenous contrast. COMPARISON:  09/16/2019 FINDINGS: Brain: No evidence of acute infarction, hemorrhage, hydrocephalus, extra-axial collection or mass lesion/mass effect. Vascular: No hyperdense vessel or unexpected calcification. Skull: Normal. Negative for fracture or focal lesion. Sinuses/Orbits: Globes and orbits are unremarkable. Dependent fluid/secretions in the maxillary sinuses. Mucosal thickening lines the ethmoid, sphenoid and right maxillary sinus. Clear mastoid air cells. Other: None. IMPRESSION: 1. No intracranial abnormality. 2. Sinus disease with mucosal thickening. Maxillary sinuses with dependent fluid/secretions. Electronically Signed   By: Lajean Manes M.D.   On:  11/10/2019 12:34   DG Chest Port 1 View  Result Date: 11/11/2019 CLINICAL DATA:  54 year old female with history of pneumonia. EXAM: PORTABLE CHEST 1 VIEW COMPARISON:  Chest x-ray 11/10/2019. FINDINGS: An endotracheal tube is in place with tip 2.6 cm above the carina. A nasogastric tube is seen extending into the stomach, however, the tip of the nasogastric tube extends below the lower margin of the image. Transcutaneous defibrillator pad projecting over the lower left hemithorax. Patchy multifocal ill-defined opacities and areas of interstitial prominence are asymmetrically distributed in the lungs bilaterally, most severe in the right mid to lower lung and at the left lung base. Overall, aeration has significantly improved compared  to the prior examination, particularly in the right upper and left upper lobes. No pneumothorax. No evidence of pulmonary edema. Heart size appears borderline enlarged. Upper mediastinal contours are within normal limits. IMPRESSION: 1. Support apparatus, as above. 2. Multilobar pneumonia redemonstrated with significant improved aeration compared to the prior examination, as above. Electronically Signed   By: Vinnie Langton M.D.   On: 11/11/2019 09:33   DG Chest Port 1 View  Result Date: 11/10/2019 CLINICAL DATA:  CPR  ETT placement EXAM: PORTABLE CHEST 1 VIEW COMPARISON:  Chest radiograph 09/16/2019 FINDINGS: Interval intubation with endotracheal tube tip between the thoracic inlet and carina. There are extensive bilateral right greater than left interstitial and airspace opacities. No pneumothorax or large pleural effusion. Probable lower right rib fracture. IMPRESSION: 1. Interval intubation with endotracheal tube tip in appropriate position. 2. Extensive bilateral right greater than left interstitial and airspace opacities, could represent pulmonary edema or aspiration. 3. Probable lower right rib fracture. Electronically Signed   By: Audie Pinto M.D.   On:  11/10/2019 10:27   DG Abd Portable 1V  Result Date: 11/10/2019 CLINICAL DATA:  Encounter for orogastric tube placement. EXAM: PORTABLE ABDOMEN - 1 VIEW COMPARISON:  CT abdomen/pelvis 02/23/2018 FINDINGS: Overlying cardiac monitoring leads. An enteric tube passes below the level of the left hemidiaphragm with tip terminating in the region of the stomach. No dilated loops of bowel are demonstrated to suggest small bowel obstruction mild air distention of the colon. No acute bony abnormality. IMPRESSION: Problem oriented examination demonstrates an enteric tube which passes below the level left hemidiaphragm with tip projecting in the region of the stomach. Electronically Signed   By: Kellie Simmering DO   On: 11/10/2019 14:00   DG Swallowing Func-Speech Pathology  Result Date: 11/12/2019 Objective Swallowing Evaluation: Type of Study: MBS-Modified Barium Swallow Study  Patient Details Name: CHALANDA VIVOLO MRN: [id number] Date of Birth: [birth year] Today's Date: 11/12/2019 Time: SLP Start Time (ACUTE ONLY): 1209 -SLP Stop Time (ACUTE ONLY): 1230 SLP Time Calculation (min) (ACUTE ONLY): 21 min Past Medical History: Past Medical History: Diagnosis Date . Alcoholic hepatitis  . Aortic atherosclerosis ([place])  . Arthritis   neck . Carpal tunnel syndrome on both sides 02/2012 . Cigarette nicotine dependence  . Contact lens/glasses fitting   wears contacts or glasses . Dental crowns present   x 2 upper front . Elevated LFTs  . Fatty liver  . Fluid retention  . History of MRSA infection  . Hypertension  . Insomnia  . Migraines  . Osteoarthritis  . Parasomnia  . Wears partial dentures   lower Past Surgical History: Past Surgical History: Procedure Laterality Date . ANKLE ARTHROSCOPY WITH RECONSTRUCTION  07/2017 . BIOPSY  02/24/2018  Procedure: BIOPSY;  Surgeon: Jackquline Denmark, MD;  Location: [facility];  Service: Endoscopy;; . CARPAL TUNNEL RELEASE  03/17/2012  Procedure: CARPAL TUNNEL RELEASE;  Surgeon: Roseanne Kaufman, MD;   Location: [facility];  Service: Orthopedics;  Laterality: Bilateral;  left limited open carpal tunnel release. right carpal tunnel injection with 1cc of celestone and 2cc of marcaine.25% . CARPAL TUNNEL RELEASE  06/30/2012  Procedure: CARPAL TUNNEL RELEASE;  Surgeon: Roseanne Kaufman, MD;  Location: [facility];  Service: Orthopedics;  Laterality: Right;  RIGHT LIMITED OPEN CARPAL TUNNEL RELEASE . CARPOMETACARPAL ([facility]) FUSION OF THUMB Left 06/23/2013  Procedure: LEFT CARPOMETACARPEL ([place]) FUSION OF THUMB WITH AUTOGRAFT FROM RADIUS AND REPAIR AS NECESSARY;  Surgeon: Roseanne Kaufman, MD;  Location: [facility];  Service: Orthopedics;  Laterality: Left; . COLONOSCOPY  2014 . ESOPHAGOGASTRODUODENOSCOPY  05/2017 . ESOPHAGOGASTRODUODENOSCOPY (EGD) WITH PROPOFOL N/A 02/24/2018  Procedure: ESOPHAGOGASTRODUODENOSCOPY (EGD) WITH PROPOFOL;  Surgeon: Jackquline Denmark, MD;  Location: [facility];  Service: Endoscopy;  Laterality: N/A; . HARDWARE REMOVAL Left 07/10/2013  Procedure: HARDWARE REMOVAL;  Surgeon: Roseanne Kaufman, MD;  Location: [facility];  Service: Orthopedics;  Laterality: Left; . INCISION AND DRAINAGE OF WOUND Left 07/10/2013  Procedure: IRRIGATION AND DEBRIDEMENT WOUND left wrist  ;  Surgeon: Roseanne Kaufman, MD;  Location: [facility];  Service: Orthopedics;  Laterality: Left; . LAPAROSCOPIC VAGINAL HYSTERECTOMY  02/24/2010 . LAPAROSCOPY  06/2010  for adhesions . LEEP    x 2 . LUMBAR DISC SURGERY  03/23/2003  left L5-S1 discectomy with microdissection . LUMBAR DISC SURGERY  05/23/2003  redo discectomy L5-S1 with microdissection HPI: 54 year old woman with a history of alcohol use and cirrhosis, depression, hypertension.  She was found altered and poorly responsive by her husband around 0530, then completely unresponsive and pulseless at 0730.  CPR was started, patient was in asystole when EMS arrived.  The estimated time to ROSC was over 30 minutes.  Brought to the ED for further support and care ventilated and  hemodynamically stabilized.  GCS on arrival 3. Pt was intubated from 4/23-4/24.   Subjective: Pt was alert and pleasant but she appeared confused Assessment / Plan / Recommendation CHL IP CLINICAL IMPRESSIONS 11/12/2019 Clinical Impression Patient presents with a mild pharyngeal phase dysphagia characterized primarily by decreased laryngeal closure s/p brief intubation resulting in deep penetration to the vocal cords without sensation, of thin liquids. This occurred consistently with use of straw which aided in larger bolus sizes, eliminated with small single cup sips. Otherwise oral phase timely and patient with full pharyngeal and upper esophageal clearance of bolus. It is approprioate to initiate a po diet at this time with aspiration precautions to mitigate risks.   SLP Visit Diagnosis Dysphagia, pharyngeal phase (R13.13) Attention and concentration deficit following -- Frontal lobe and executive function deficit following -- Impact on safety and function Mild aspiration risk   CHL IP TREATMENT RECOMMENDATION 11/12/2019 Treatment Recommendations Therapy as outlined in treatment plan below   Prognosis 11/12/2019 Prognosis for Safe Diet Advancement Good Barriers to Reach Goals Cognitive deficits Barriers/Prognosis Comment -- [facility] IP DIET RECOMMENDATION 11/12/2019 SLP Diet Recommendations Regular solids;Thin liquid Liquid Administration via Cup;No straw Medication Administration Whole meds with puree Compensations Slow rate;Small sips/bites Postural Changes Seated upright at 90 degrees   CHL IP OTHER RECOMMENDATIONS 11/12/2019 Recommended Consults -- Oral Care Recommendations Oral care BID Other Recommendations --   [facility] IP FOLLOW UP RECOMMENDATIONS 11/12/2019 Follow up Recommendations None   [facility] IP FREQUENCY AND DURATION 11/12/2019 Speech Therapy Frequency (ACUTE ONLY) min 2x/week Treatment Duration 2 weeks      [facility]L IP ORAL PHASE 11/12/2019 Oral Phase WFL Oral - Pudding Teaspoon -- Oral - Pudding Cup -- Oral - Honey  Teaspoon -- Oral - Honey Cup -- Oral - Nectar Teaspoon -- Oral - Nectar Cup -- Oral - Nectar Straw -- Oral - Thin Teaspoon -- Oral - Thin Cup -- Oral - Thin Straw -- Oral - Puree -- Oral - Mech Soft -- Oral - Regular -- Oral - Multi-Consistency -- Oral - Pill -- Oral Phase - Comment --  [facility] IP PHARYNGEAL PHASE 11/12/2019 Pharyngeal Phase Impaired Pharyngeal- Pudding Teaspoon -- Pharyngeal -- Pharyngeal- Pudding Cup -- Pharyngeal -- Pharyngeal- Honey Teaspoon -- Pharyngeal -- Pharyngeal- Honey Cup -- Pharyngeal -- Pharyngeal- Nectar Teaspoon -- Pharyngeal --  Pharyngeal- Nectar Cup -- Pharyngeal -- Pharyngeal- Nectar Straw -- Pharyngeal -- Pharyngeal- Thin Teaspoon -- Pharyngeal -- Pharyngeal- Thin Cup WFL Pharyngeal -- Pharyngeal- Thin Straw Reduced airway/laryngeal closure;Penetration/Aspiration during swallow Pharyngeal Material enters airway, CONTACTS cords and not ejected out Pharyngeal- Puree WFL Pharyngeal -- Pharyngeal- Mechanical Soft -- Pharyngeal -- Pharyngeal- Regular WFL Pharyngeal -- Pharyngeal- Multi-consistency -- Pharyngeal -- Pharyngeal- Pill -- Pharyngeal -- Pharyngeal Comment --  [facility] IP CERVICAL ESOPHAGEAL PHASE 11/12/2019 Cervical Esophageal Phase WFL Pudding Teaspoon -- Pudding Cup -- Honey Teaspoon -- Honey Cup -- Nectar Teaspoon -- Nectar Cup -- Nectar Straw -- Thin Teaspoon -- Thin Cup -- Thin Straw -- Puree -- Mechanical Soft -- Regular -- Multi-consistency -- Pill -- Cervical Esophageal Comment -- Gabriel Rainwater MA, CCC-SLP [place] Leah Meryl 11/12/2019, 12:50 PM              EEG adult  Result Date: 11/10/2019 Lora Havens, MD     11/10/2019  4:42 PM Patient Name: INIYAH PINERA MRN: [id number] Epilepsy Attending: Lora Havens Referring Physician/Provider: Salvadore Dom, NP Date: 11/10/2019 Duration: 24.14 mins Patient history: 54 year old female status post cardiac arrest.  EEG evaluate for seizures. Level of alertness: Comatose AEDs during EEG study: None Technical aspects: This EEG  study was done with scalp electrodes positioned according to the 10-20 International system of electrode placement. Electrical activity was acquired at a sampling rate of 500Hz  and reviewed with a high frequency filter of 70Hz  and a low frequency filter of 1Hz . EEG data were recorded continuously and digitally stored. Description: EEG showed continuous generalized  3 to 5 Hz theta slowing. Reactivity was not tested during this study. Photic driving was not seen during photic stimulation. Hyperventilation was not performed. Abnormality -Continuous slow, generalized IMPRESSION: This study is suggestive of profound diffuse encephalopathy, non specific to encephalopathy. No seizures or epileptiform discharges were seen throughout the recording. Lora Havens   ECHOCARDIOGRAM COMPLETE  Result Date: 11/13/2019    ECHOCARDIOGRAM REPORT   Patient Name:   JACIE VANWERT Date of Exam: 11/13/2019 Medical Rec #:  [id number]          Height:       63.0 in Accession #:    [id number]         Weight:       137.8 lb Date of Birth:  [birth year]          BSA:          1.651 m Patient Age:    6 years           BP:           171/108 mmHg Patient Gender: F                  HR:           93 bpm. Exam Location:  Inpatient Procedure: 2D Echo and Intracardiac Opacification Agent Indications:    Cardiac arrest I46.9  History:        Patient has no prior history of Echocardiogram examinations.                 COPD; Risk Factors:Former Smoker. Alcoholic related cirrhosis of                 the liver, Acute metabolic encephalopathy, Acute respiratory                 failure with hypoxemia.  Sonographer:    Darlina Sicilian RDCS Referring Phys:  [place] Comments: Technically challenging study due to limited acoustic windows and suboptimal apical window. IMPRESSIONS  1. Left ventricular ejection fraction, by estimation, is 50 to 55%. The left ventricle has low normal function. The left ventricle has no regional wall  motion abnormalities. Left ventricular diastolic parameters are indeterminate.  2. Right ventricular systolic function is normal. The right ventricular size is normal. Tricuspid regurgitation signal is inadequate for assessing PA pressure.  3. The mitral valve is normal in structure. No evidence of mitral valve regurgitation. No evidence of mitral stenosis.  4. The aortic valve is tricuspid. Aortic valve regurgitation is not visualized. Unable to assess for aortic valve stenosis.  5. The inferior vena cava is normal in size with greater than 50% respiratory variability, suggesting right atrial pressure of 3 mmHg. FINDINGS  Left Ventricle: Left ventricular ejection fraction, by estimation, is 50 to 55%. The left ventricle has low normal function. The left ventricle has no regional wall motion abnormalities. Definity contrast agent was given IV to delineate the left ventricular endocardial borders. The left ventricular internal cavity size was normal in size. There is no left ventricular hypertrophy. Left ventricular diastolic parameters are indeterminate. Right Ventricle: The right ventricular size is normal. No increase in right ventricular wall thickness. Right ventricular systolic function is normal. Tricuspid regurgitation signal is inadequate for assessing PA pressure. Left Atrium: Left atrial size was not well visualized. Right Atrium: Right atrial size was not well visualized. Pericardium: There is no evidence of pericardial effusion. Mitral Valve: The mitral valve is normal in structure. Normal mobility of the mitral valve leaflets. No evidence of mitral valve regurgitation. No evidence of mitral valve stenosis. Tricuspid Valve: The tricuspid valve is normal in structure. Tricuspid valve regurgitation is trivial. No evidence of tricuspid stenosis. Aortic Valve: The aortic valve is tricuspid. Aortic valve regurgitation is not visualized. Unable to assess for aortic valve stenosis. Pulmonic Valve: The pulmonic  valve was normal in structure. Pulmonic valve regurgitation is not visualized. No evidence of pulmonic stenosis. Aorta: The aortic root is normal in size and structure. Venous: The inferior vena cava is normal in size with greater than 50% respiratory variability, suggesting right atrial pressure of 3 mmHg. IAS/Shunts: No atrial level shunt detected by color flow Doppler.  LEFT VENTRICLE PLAX 2D LVIDd:         4.50 cm LVIDs:         3.55 cm LV PW:         0.80 cm LV IVS:        0.97 cm LVOT diam:     1.80 cm LVOT Area:     2.54 cm  LV Volumes (MOD) LV vol d, MOD A2C: 134.0 ml LV vol d, MOD A4C: 133.0 ml LV vol s, MOD A2C: 49.5 ml LV vol s, MOD A4C: 52.6 ml LV SV MOD A2C:     84.5 ml LV SV MOD A4C:     133.0 ml LV SV MOD BP:      83.7 ml RIGHT VENTRICLE RV S prime:     14.40 cm/s TAPSE (M-mode): 2.3 cm LEFT ATRIUM             Index LA diam:        2.80 cm 1.70 cm/m LA Vol (A2C):   32.4 ml 19.63 ml/m LA Vol (A4C):   20.4 ml 12.36 ml/m LA Biplane Vol: 27.1 ml 16.42 ml/m   AORTA Ao Root diam: 3.10 cm MITRAL VALVE MV  Area (PHT): 8.92 cm    SHUNTS MV Decel Time: 85 msec     Systemic Diam: 1.80 cm MV E velocity: 70.70 cm/s MV A velocity: 94.70 cm/s MV E/A ratio:  0.75 Cherlynn Kaiser MD Electronically signed by Cherlynn Kaiser MD Signature Date/Time: 11/13/2019/10:19:31 AM    Final       Phillips Climes M.D on 11/13/2019 at 2:34 PM  Between 7am to 7pm - Pager - [phone]  After 7pm go to [email or web address] - password [facility]  [facility] -  Office  [phone]

## 2019-11-13 NOTE — Progress Notes (Signed)
  Speech Language Pathology Treatment: Dysphagia  Patient Details Name: Amy Villa MRN: [id number] DOB: Jan 27, 1966 Today's Date: 11/13/2019 Time: [id number] SLP Time Calculation (min) (ACUTE ONLY): 8 min  Assessment / Plan / Recommendation Clinical Impression  Pt tolerating small amounts of meals, appetite is poor. Pt continues to have intermittent soft coughing throughout observations with intake. Discussed that coughing was not indicative of choking on MBS. Pt has baseline congestion and coughing due to baseline congestion. Recommend pt continue current diet with no SLP f/u needed for swallowing. Pt does demonstrate cognitive changes and would benefit from f/u for cognition at the next level of care. Will sign off at this time.   HPI HPI: 54 year old woman with a history of alcohol use and cirrhosis, depression, hypertension.  She was found altered and poorly responsive by her husband around 0530, then completely unresponsive and pulseless at 0730.  CPR was started, patient was in asystole when EMS arrived.  The estimated time to ROSC was over 30 minutes.  Brought to the ED for further support and care ventilated and hemodynamically stabilized.  GCS on arrival 3. Pt was intubated from 4/23-4/24.        SLP Plan  Continue with current plan of care       Recommendations  Diet recommendations: Thin liquid;Regular Liquids provided via: Cup;Straw Medication Administration: Via alternative means Supervision: Patient able to self feed Compensations: Slow rate;Small sips/bites                Oral Care Recommendations: Oral care QID;Staff/trained caregiver to provide oral care Follow up Recommendations: None SLP Visit Diagnosis: Dysphagia, pharyngeal phase (R13.13) Plan: Continue with current plan of care       GO               Herbie [place], MA [place] Pager [phone] Office [phone]  Lynann Beaver 11/13/2019, 2:09  PM

## 2019-11-13 NOTE — Progress Notes (Signed)
  Echocardiogram 2D Echocardiogram with definity has been performed.  Darlina Sicilian M 11/13/2019, 8:19 AM

## 2019-11-13 NOTE — Progress Notes (Addendum)
Nutrition Follow-up  DOCUMENTATION CODES:   Not applicable  INTERVENTION:  Provide Ensure Enlive po TID, each supplement provides 350 kcal and 20 grams of protein  Encourage adequate PO intake.   NUTRITION DIAGNOSIS:   Inadequate oral intake related to inability to eat as evidenced by NPO status; diet advanced; progressing  GOAL:   Patient will meet greater than or equal to 90% of their needs; progressing  MONITOR:   PO intake, Supplement acceptance, Skin, Weight trends, Labs, I & O's  REASON FOR ASSESSMENT:   Ventilator    ASSESSMENT:   Pt is a 54 year old female with PMH of former tobacco abuse, Anxiety/depression, insomnia, alcoholic related cirrhosis of the liver with esophageal varices, COPD, gastroesophageal reflux disease without esophagitis, hypertension, iron deficiency anemia. Admitted s/p cardiac arrest. Intubated 4/23. Extubated 4/24.  RD working remotely.   Pt is currently on a regular diet with thin liquids. Meal completion 5% this morning at breakfast. Acute metabolic encephalopathy improving per MD. RD to order nutritional supplements to aid in caloric and protein needs.   Labs and medications reviewed.   Diet Order:   Diet Order            Diet regular Room service appropriate? Yes; Fluid consistency: Thin  Diet effective now              EDUCATION NEEDS:   Not appropriate for education at this time  Skin:  Skin Assessment: Reviewed RN Assessment  Last BM:  4/25  Height:   Ht Readings from Last 1 Encounters:  11/12/19 5\' 3"  (1.6 m)    Weight:   Wt Readings from Last 1 Encounters:  11/13/19 62.5 kg    BMI:  Body mass index is 24.41 kg/m.  Estimated Nutritional Needs:   Kcal:  1800-2000  Protein:  85-95 grams  Fluid:  >/= 1.8 L/day   Corrin Parker, MS, RD, LDN RD pager number/after hours weekend pager number on Amion.

## 2019-11-13 NOTE — Progress Notes (Signed)
Inpatient Rehab Admissions:  Inpatient Rehab Consult received.  I met with pt at the bedside for rehabilitation assessment and to discuss goals and expectations of an inpatient rehab admission.  Pt appears to be an appropriate candidate for CIR. Explained CIR goals and expectations. Pt acknowledged understanding and indicated that she is interested.  Also called pt's husband.  He also acknowledged understanding and seemed interested in CIR for his wife. Will begin insurance authorization for potential admission to CIR.  Signed: Gayland Curry, [place], [place] Admissions Coordinator [phone]

## 2019-11-14 LAB — CBC
HCT: 37.4 % (ref 36.0–46.0)
Hemoglobin: 11.6 g/dL — ABNORMAL LOW (ref 12.0–15.0)
MCH: 26.4 pg (ref 26.0–34.0)
MCHC: 31 g/dL (ref 30.0–36.0)
MCV: 85.2 fL (ref 80.0–100.0)
Platelets: 126 10*3/uL — ABNORMAL LOW (ref 150–400)
RBC: 4.39 MIL/uL (ref 3.87–5.11)
RDW: 22 % — ABNORMAL HIGH (ref 11.5–15.5)
WBC: 6.7 10*3/uL (ref 4.0–10.5)
nRBC: 0 % (ref 0.0–0.2)

## 2019-11-14 LAB — COMPREHENSIVE METABOLIC PANEL
ALT: 108 U/L — ABNORMAL HIGH (ref 0–44)
AST: 45 U/L — ABNORMAL HIGH (ref 15–41)
Albumin: 3.4 g/dL — ABNORMAL LOW (ref 3.5–5.0)
Alkaline Phosphatase: 149 U/L — ABNORMAL HIGH (ref 38–126)
Anion gap: 12 (ref 5–15)
BUN: 10 mg/dL (ref 6–20)
CO2: 19 mmol/L — ABNORMAL LOW (ref 22–32)
Calcium: 9.3 mg/dL (ref 8.9–10.3)
Chloride: 109 mmol/L (ref 98–111)
Creatinine, Ser: 0.49 mg/dL (ref 0.44–1.00)
GFR calc Af Amer: >60 mL/min (ref >60)
GFR calc non Af Amer: >60 mL/min (ref >60)
Glucose, Bld: 163 mg/dL — ABNORMAL HIGH (ref 70–99)
Potassium: 4.6 mmol/L (ref 3.5–5.1)
Sodium: 140 mmol/L (ref 135–145)
Total Bilirubin: 0.8 mg/dL (ref 0.3–1.2)
Total Protein: 7 g/dL (ref 6.5–8.1)

## 2019-11-14 LAB — CULTURE, RESPIRATORY W GRAM STAIN

## 2019-11-14 LAB — GLUCOSE, CAPILLARY
Glucose-Capillary: 109 mg/dL — ABNORMAL HIGH (ref 70–99)
Glucose-Capillary: 125 mg/dL — ABNORMAL HIGH (ref 70–99)
Glucose-Capillary: 155 mg/dL — ABNORMAL HIGH (ref 70–99)
Glucose-Capillary: 183 mg/dL — ABNORMAL HIGH (ref 70–99)
Glucose-Capillary: 195 mg/dL — ABNORMAL HIGH (ref 70–99)
Glucose-Capillary: 213 mg/dL — ABNORMAL HIGH (ref 70–99)

## 2019-11-14 LAB — PROTIME-INR
INR: 1.1 (ref 0.8–1.2)
Prothrombin Time: 14.1 seconds (ref 11.4–15.2)

## 2019-11-14 LAB — MAGNESIUM: Magnesium: 1.8 mg/dL (ref 1.7–2.4)

## 2019-11-14 MED ORDER — HYDROCOD POLST-CPM POLST ER 10-8 MG/5ML PO SUER
5.0000 mL | Freq: Two times a day (BID) | ORAL | Status: DC | PRN
  Administered 2019-11-14: 5 mL via ORAL
  Filled 2019-11-14: qty 5

## 2019-11-14 MED ORDER — ACETAMINOPHEN 500 MG PO TABS
500.0000 mg | ORAL_TABLET | Freq: Four times a day (QID) | ORAL | Status: DC | PRN
  Administered 2019-11-14: 500 mg via ORAL
  Filled 2019-11-14: qty 1

## 2019-11-14 MED ORDER — LACTULOSE 10 GM/15ML PO SOLN
30.0000 g | Freq: Every day | ORAL | Status: DC
  Administered 2019-11-15: 10:00:00 30 g via ORAL
  Filled 2019-11-14: qty 45

## 2019-11-14 MED ORDER — FLUCONAZOLE 200 MG PO TABS
200.0000 mg | ORAL_TABLET | Freq: Every day | ORAL | Status: DC
  Filled 2019-11-14: qty 1

## 2019-11-14 NOTE — TOC Progression Note (Signed)
Transition of Care [facility]) - Progression Note    Patient Details  Name: Amy Villa MRN: [id number] Date of Birth: 04-Nov-1965  [facility] CM/SW Contact  Zenon Mayo, RN Phone Number: 11/14/2019, 9:11 AM  Clinical Narrative:    NCM spoke with patient over the phone, we discussed if the insurance denied CIR then will need a back up plan such as SNF or Home with [facility] services, she states she will discuss with her spouse when he gets to the hospital, so they can be thinking about it.        Expected Discharge Plan and Services                                                 Social Determinants of Health (SDOH) Interventions    Readmission Risk Interventions No flowsheet data found.

## 2019-11-14 NOTE — Progress Notes (Signed)
Inpatient Rehab Admissions Coordinator:    Saw pt and husband. Informed them that CIR is out-of-network for pt's insurance benefits.  They acknowledged understanding and husband stated he forgot to mention that to me yesterday during our phone conversation.  Provided them with the list of facilities that are in-network per [place] message left on voicemail today; also informed pt and husband about alternate rehab options such as [facility] rehab or outpatient rehab.  Also informed TOC that CIR is out of network for pt.   Will sign off on this pt.  Gayland Curry, [place], [place] Admissions Coordinator [phone]

## 2019-11-14 NOTE — Progress Notes (Signed)
Physical Therapy Treatment Patient Details Name: Amy Villa MRN: [id number] DOB: 06-12-1966 Today's Date: 11/14/2019    History of Present Illness 54 year old woman with a history of alcohol use and cirrhosis, depression, hypertension.  She was found altered and poorly responsive by her husband around 0530, then completely unresponsive and pulseless at 0730.  CPR was started, patient was in asystole when EMS arrived.  The estimated time to ROSC was over 30 minutes.  Brought to the ED for further support and care ventilated and hemodynamically stabilized.  GCS on arrival 3. Pt was intubated from 4/23-4/24.    PT Comments    Pt admitted with above diagnosis. Pt was able to ambulate on unit with min guard assist without device and feel that pt may need RW for home and encouarged this at home. Husband states pt wont use and she states she will.  Pt with memory issues and discussed with pt and husband the need for 24 hour care. Pt did not recognize that she was in hospital or why she was in hospital and states the items at the sink are not hers.  Have to reorient pt frequently.  Pt needs continued therapy.    Pt currently with functional limitations due to balance and endurance deficits. Pt will benefit from skilled PT to increase their independence and safety with mobility to allow discharge to the venue listed below.     Follow Up Recommendations  CIR;Supervision/Assistance - 24 hour     Equipment Recommendations  Rolling walker with 5" wheels    Recommendations for Other Services Rehab consult     Precautions / Restrictions Precautions Precautions: Fall Restrictions Weight Bearing Restrictions: No    Mobility  Bed Mobility Overal bed mobility: Needs Assistance Bed Mobility: Supine to Sit     Supine to sit: Min guard     General bed mobility comments: used rail a little  Transfers Overall transfer level: Needs assistance Equipment used: None Transfers: Sit to/from  Stand Sit to Stand: Min guard Stand pivot transfers: Min guard       General transfer comment: Assist to stedy self upon standing. Cues for safety and pacing.   Ambulation/Gait Ambulation/Gait assistance: Min guard;Min assist Gait Distance (Feet): 200 Feet Assistive device: None Gait Pattern/deviations: Step-through pattern;Decreased stride length;Drifts right/left   Gait velocity interpretation: 1.31 - 2.62 ft/sec, indicative of limited community ambulator General Gait Details: Pt able to ambulate in hallway without device with occasional drift to right and needing min guard assist to steady.  Discussed use of RW at home which husband states pt wont use and pt states she will.  Pt does have some memory problems. Disccused need for 24 hour care and husband says that it will be close to 24 hours provided.  Discussed that pt not use stove as she may forget to get items off stove and pt husband states they use insta pot.  Reminded him she could forget that as well.     Stairs             Wheelchair Mobility    Modified Rankin (Stroke Patients Only)       Balance Overall balance assessment: Needs assistance Sitting-balance support: Feet supported Sitting balance-Leahy Scale: Good     Standing balance support: Single extremity supported Standing balance-Leahy Scale: Fair Standing balance comment: general weakness needing UE support for balance  Cognition Arousal/Alertness: Awake/alert Behavior During Therapy: WFL for tasks assessed/performed Overall Cognitive Status: Impaired/Different from baseline Area of Impairment: Orientation;Memory                 Orientation Level: Disoriented to;Place Current Attention Level: Sustained Memory: Decreased short-term memory         General Comments: Pt able to follow commands. Difficulty recalling from past, looking to spouse for answers.       Exercises      General Comments  General comments (skin integrity, edema, etc.): Pt went to sink to brush teeth after putting her socks on on her own. Pt stated that the items at sink werent hers and this PT reminded her she was in hospital because of cardiac arrest. Pt did not remember this.  Poor short term memorty.       Pertinent Vitals/Pain Pain Assessment: Faces Faces Pain Scale: Hurts even more Pain Location: generalized aches with mobility Pain Descriptors / Indicators: Grimacing;Moaning;Discomfort Pain Intervention(s): Limited activity within patient's tolerance;Monitored during session;Repositioned    Home Living                      Prior Function            PT Goals (current goals can now be found in the care plan section) Acute Rehab PT Goals Patient Stated Goal: to go home Progress towards PT goals: Progressing toward goals    Frequency    Min 3X/week      PT Plan Current plan remains appropriate    Co-evaluation              AM-PAC PT "6 Clicks" Mobility   Outcome Measure  Help needed turning from your back to your side while in a flat bed without using bedrails?: A Little Help needed moving from lying on your back to sitting on the side of a flat bed without using bedrails?: A Little Help needed moving to and from a bed to a chair (including a wheelchair)?: A Little Help needed standing up from a chair using your arms (e.g., wheelchair or bedside chair)?: A Little Help needed to walk in hospital room?: A Little Help needed climbing 3-5 steps with a railing? : Total 6 Click Score: 16    End of Session Equipment Utilized During Treatment: Gait belt Activity Tolerance: Patient tolerated treatment well Patient left: in chair;with call bell/phone within reach;with family/visitor present Nurse Communication: Mobility status PT Visit Diagnosis: Other abnormalities of gait and mobility (R26.89);Muscle weakness (generalized) (M62.81);Other symptoms and signs involving the  nervous system (R29.898)     Time: [id number] PT Time Calculation (min) (ACUTE ONLY): 19 min  Charges:  $Gait Training: 8-22 mins                     Karys Meckley W,PT [place] Pager:  [phone]  Office:  [place] 11/14/2019, 11:57 AM

## 2019-11-14 NOTE — Progress Notes (Signed)
PROGRESS NOTE                                                                                                                                                                                                             Patient Demographics:    Amy Villa, is a 54 y.o. female, DOB - 23-Jan-1966, [id number]  Admit date - 11/10/2019   Admitting Physician Collene Gobble, MD  Outpatient Primary MD for the patient is Patient, No Pcp Per  LOS - 4   Chief Complaint  Patient presents with  . CPR       Brief Narrative    53 tobacco abuse, Anxiety/depression, insomnia, alcoholic related cirrhosis of the liver with esophageal varices, COPD, gastroesophageal reflux disease without esophagitis, hypertension, iron deficiency anemia , with Recent PCP visit requiring Seroquel refill while searching for new psychiatrist.    normal state of healthon 4/22. Husband sleeps in another room.  Found her altered and was not able to wake her up-found pulseless and not breathing EMS called -asystole on arrival, time to return of spontaneous circulation estimated at over 30 minutes, she was admitted to ICU, where she was encephalopathic required intubation, mentation has improved, where she was successfully extubated 4/24, and transferred to triad care on 4/25.    Subjective:   Awake coherent about to have breakfast thinks that she is at [facility] but gets all of the other orientation questions such as date time place year season correct Nursing reports copious diarrhea overnight but is on lactulose twice daily They also report that she is much improved from prior without any other issues    Assessment  & Plan :    Principal Problem:   Cardiac arrest [facility]) Active Problems:   Anemia iron def   Liver cirrhosis (HCC)   Anxiety and depression   Substance abuse ([place])   Coagulopathy ([place])   Acute metabolic encephalopathy   Acute respiratory failure with  hypoxemia (HCC)   Aspiration pneumonia (HCC)   AKI (acute kidney injury) ([place])   Lactic acidosis   Hyperkalemia   Hypothermia   Cardiopulmonary arrest, asystole initial rhythm. Etiology unclear, but some polypharmacy likely contributing Recent start at Seroquel with therapeutic duplication of other meds such as BuSpar, Flexeril, Prozac, Neurontin. -Echo showing preserved EF 50 to 55% -Continue to monitor on telemetry.  No  significant events on telemetry -Stop stress dose Solu-Medrol right now as no evidences of any wheeze or hypotension  Acute hypoxic respiratory failure following cardiopulmonary arrest,  complicated byAspiration pneumoniaand probable right lower rib fracture as a consequence of CPR -She was successfully extubated 4/24, currently on nasal cannula. -Transitioning Unasyn to Augmentin stop date = 5/2  Acute metabolic encephalopathy -Condition is improving, she is following commands, but certainly not back at baseline. - Alert and following commands this AM-much improved - CT head negative. EEG with diffuse encephalopathy  - Continue lactulose but change to once daily, ammonia level at 38, it was elevated on admission - Continue holding buspar, flexeril, prozac, and neurontin for now---we will reinitiate only Prozac on discharge and BuSpar-would not resume Flexeril or Neurontin given metabolic encephalopathy - SLP started her on regular diet -She would certainly benefit from CIR-  aspiration pneumonia - Continue Unasyn as outlined above  - Follow cultures.  - Follow fever curve and trend WBC  History of alcohol related liver cirrhosis without ascites but with history of esophageal varices Mild coagulopathy  -NR level mildly elevated at 1.3---repeat INR tomorrow morning to ensure resolution Continue lactulose , ammonia of 122 on admission,-cut back lactulose given copious diarrhea - elevated LFTs most likely related cardiac arrest, will continue to  trend -Patient had endoscopy, and liver ultrasound done in the past showing some evidence of cirrhosis and low-grade varices. -Resume back on propranolol, especially with high blood pressure and tachycardia.  Hx anxiety and depression - Continue holding buspar, flexeril, prozac, and neurontin for now-see above discussion  History of gastroesophageal reflux disease - PPI  History of iron deficiency anemia - Trend cbc  Hypokalemia -Repletred   COVID-19 Labs  No results for input(s): DDIMER, FERRITIN, LDH, CRP in the last 72 hours.  Lab Results  Component Value Date   SARSCOV2NAA NEGATIVE 11/10/2019   [place] NEGATIVE 09/16/2019     Code Status : Full Family Communication  : D/W husband via phone 4/25, left voicemail 4/26-discussed on phone with husband Abe People he has some concerns as below Disposition Plan  : I have [place] with CIR family-it appears that the patient is out of network for CIR at home and they will have to determine safest placement option. Status is: Inpatient but likely can discharge in a.m. 4/28 if hemodynamically stable  Remains inpatient appropriate because:Hemodynamically unstable and IV treatments appropriate due to intensity of illness or inability to take PO  Dispo: The patient is from: CIR              Anticipated d/c is to: To be seen by PT               Anticipated d/c date is: 1 day              Patient currently is not medically stable to d/c.  Consults  :   PCCM  Procedures  :  None  DVT Prophylaxis  :  [place] heparin  Lab Results  Component Value Date   PLT 126 (L) 11/14/2019    Antibiotics  :    Anti-infectives (From admission, onward)   Start     Dose/Rate Route Frequency Ordered Stop   11/11/19 1200  Ampicillin-Sulbactam (UNASYN) 3 g in sodium chloride 0.9 % 100 mL IVPB     3 g 200 mL/hr over 30 Minutes Intravenous Every 6 hours 11/11/19 0738     11/10/19 1800  ampicillin-sulbactam (UNASYN) 1.5 g in sodium  chloride 0.9 %  100 mL IVPB  Status:  Discontinued     1.5 g 200 mL/hr over 30 Minutes Intravenous Every 6 hours 11/10/19 1209 11/11/19 0738   11/10/19 1100  vancomycin (VANCOREADY) IVPB 1250 mg/250 mL     1,250 mg 166.7 mL/hr over 90 Minutes Intravenous  Once 11/10/19 1019 11/10/19 1543   11/10/19 1015  ceFEPIme (MAXIPIME) 2 g in sodium chloride 0.9 % 100 mL IVPB     2 g 200 mL/hr over 30 Minutes Intravenous  Once 11/10/19 1000 11/10/19 1127   11/10/19 1015  metroNIDAZOLE (FLAGYL) IVPB 500 mg     500 mg 100 mL/hr over 60 Minutes Intravenous  Once 11/10/19 1000 11/10/19 1158   11/10/19 1015  vancomycin (VANCOCIN) IVPB 1000 mg/200 mL premix  Status:  Discontinued     1,000 mg 200 mL/hr over 60 Minutes Intravenous  Once 11/10/19 1000 11/10/19 1019        Objective:   Vitals:   11/14/19 0100 11/14/19 0402 11/14/19 0725 11/14/19 0813  BP:  (!) 175/87 (!) 164/98 (!) 160/80  Pulse:  66 80 69  Resp:  19  18  Temp:  98.1 F (36.7 C) 98.2 F (36.8 C) 98.3 F (36.8 C)  TempSrc:  Oral Oral Oral  SpO2:  98% 96% 100%  Weight: 62.1 kg     Height:        Wt Readings from Last 3 Encounters:  11/14/19 62.1 kg  09/16/19 62.1 kg  04/12/18 64 kg     Intake/Output Summary (Last 24 hours) at 11/14/2019 0815 Last data filed at 11/13/2019 1433 Gross per 24 hour  Intake 360 ml  Output 600 ml  Net -240 ml     Physical Exam  Very flat affect-EOMI NCAT no focal deficit S1-S2 no murmur rub or gallop Abdomen is soft no rebound no hepatosplenomegaly Chest is clear with no added sound no rales no rhonchi No lower extremity edema no rash ROM intact and motor is 5/5 No flap no tremor    Data Review:    CBC Recent Labs  Lab 11/10/19 1015 11/10/19 1213 11/10/19 1321 11/10/19 1611 11/11/19 0541 11/11/19 0607 11/12/19 0348 11/13/19 0524 11/14/19 0510  WBC 36.4*   < > 27.8*  --  14.4*  --  9.6 9.4 6.7  HGB 12.9   < > 13.7   < > 10.7* 11.6* 10.4* 10.8* 11.6*  HCT 46.9*   < > 46.9*    < > 35.6* 34.0* 32.9* 34.8* 37.4  PLT 191   < > 154  --  126*  --  102* 103* 126*  MCV 98.5   < > 91.8  --  89.0  --  85.0 86.6 85.2  MCH 27.1   < > 26.8  --  26.8  --  26.9 26.9 26.4  MCHC 27.5*   < > 29.2*  --  30.1  --  31.6 31.0 31.0  RDW 22.5*   < > 22.5*  --  22.2*  --  22.3* 22.0* 22.0*  LYMPHSABS 3.2  --   --   --   --   --   --   --   --   MONOABS 1.9*  --   --   --   --   --   --   --   --   EOSABS 0.2  --   --   --   --   --   --   --   --   BASOSABS  0.4*  --   --   --   --   --   --   --   --    < > = values in this interval not displayed.    Chemistries  Recent Labs  Lab 11/10/19 1015 11/10/19 1213 11/11/19 0541 11/11/19 0541 11/11/19 0607 11/11/19 0716 11/11/19 1654 11/12/19 0535 11/12/19 1910 11/13/19 0524 11/14/19 0510  NA 134*   < > 138   < >   < >  --  143 141 138 141 140  K 5.4*   < > 3.9   < >   < >  --  3.4* 2.9* 3.6 3.4* 4.6  CL 103   < > 108   < >  --   --  110 103 101 106 109  CO2 12*   < > 22   < >  --   --  24 27 24 22  19*  GLUCOSE 136*   < > 133*   < >  --   --  99 118* 102* 124* 163*  BUN 18   < > 12   < >  --   --  8 <5* <5* 5* 10  CREATININE 1.67*   < > 0.83   < >  --   --  0.49 0.57 0.57 0.59 0.49  CALCIUM 8.2*   < > 8.3*   < >  --   --  8.5* 8.7* 8.9 8.7* 9.3  MG  --   --  2.0  --   --   --   --  1.7  --   --  1.8  AST 318*  --   --   --   --  341*  --   --   --  89* 45*  ALT 252*  --   --   --   --  294*  --   --   --  154* 108*  ALKPHOS 173*  --   --   --   --  105  --   --   --  153* 149*  BILITOT 0.7  --   --   --   --  0.7  --   --   --  1.7* 0.8   < > = values in this interval not displayed.   ------------------------------------------------------------------------------------------------------------------ No results for input(s): CHOL, HDL, LDLCALC, TRIG, CHOLHDL, LDLDIRECT in the last 72 hours.  Lab Results  Component Value Date   HGBA1C 6.0 11/30/2016    ------------------------------------------------------------------------------------------------------------------ No results for input(s): TSH, T4TOTAL, T3FREE, THYROIDAB in the last 72 hours.  Invalid input(s): FREET3 ------------------------------------------------------------------------------------------------------------------ No results for input(s): VITAMINB12, FOLATE, FERRITIN, TIBC, IRON, RETICCTPCT in the last 72 hours.  Coagulation profile Recent Labs  Lab 11/10/19 1015 11/11/19 0716  INR 1.7* 1.3*    No results for input(s): DDIMER in the last 72 hours.  Cardiac Enzymes No results for input(s): CKMB, TROPONINI, MYOGLOBIN in the last 168 hours.  Invalid input(s): CK ------------------------------------------------------------------------------------------------------------------    Component Value Date/Time   BNP 235.7 (H) 11/10/2019 1015    Inpatient Medications  Scheduled Meds: . Chlorhexidine Gluconate Cloth  6 each Topical Daily  . feeding supplement (ENSURE ENLIVE)  237 mL Oral TID BM  . folic acid  1 mg Oral Daily  . guaiFENesin  1,200 mg Oral BID  . heparin  5,000 Units Subcutaneous Q8H  . lactulose  30 g Oral TID  . methylPREDNISolone (SOLU-MEDROL) injection  40 mg Intravenous  Q12H  . multivitamin with minerals  1 tablet Oral Daily  . pantoprazole  40 mg Oral QHS  . propranolol  20 mg Oral BID   Continuous Infusions: . sodium chloride 250 mL (11/12/19 2143)  . ampicillin-sulbactam (UNASYN) IV 3 g (11/14/19 [id number])  . lactated ringers 75 mL/hr at 11/14/19 0226  . thiamine injection 250 mg (11/14/19 0802)   PRN Meds:.sodium chloride, docusate sodium, hydrALAZINE, ketorolac, polyethylene glycol  Micro Results Recent Results (from the past 240 hour(s))  Respiratory Panel by RT PCR (Flu A&B, Covid) - Nasopharyngeal Swab     Status: None   Collection Time: 11/10/19 10:15 AM   Specimen: Nasopharyngeal Swab  Result Value Ref Range Status   SARS  Coronavirus 2 by RT PCR NEGATIVE NEGATIVE Final    Comment: (NOTE) SARS-CoV-2 target nucleic acids are NOT DETECTED. The SARS-CoV-2 RNA is generally detectable in upper respiratoy specimens during the acute phase of infection. The lowest concentration of SARS-CoV-2 viral copies this assay can detect is 131 copies/mL. A negative result does not preclude SARS-Cov-2 infection and should not be used as the sole basis for treatment or other patient management decisions. A negative result may occur with  improper specimen collection/handling, submission of specimen other than nasopharyngeal swab, presence of viral mutation(s) within the areas targeted by this assay, and inadequate number of viral copies (<131 copies/mL). A negative result must be combined with clinical observations, patient history, and epidemiological information. The expected result is Negative. Fact Sheet for Patients:  PinkCheek.be Fact Sheet for Healthcare Providers:  GravelBags.it This test is not yet ap proved or cleared by the [place] FDA and  has been authorized for detection and/or diagnosis of SARS-CoV-2 by FDA under an Emergency Use Authorization (EUA). This EUA will remain  in effect (meaning this test can be used) for the duration of the COVID-19 declaration under Section 564(b)(1) of the Act, 21 U.S.C. section 360bbb-3(b)(1), unless the authorization is terminated or revoked sooner.    Influenza A by PCR NEGATIVE NEGATIVE Final   Influenza B by PCR NEGATIVE NEGATIVE Final    Comment: (NOTE) The Xpert Xpress SARS-CoV-2/FLU/RSV assay is intended as an aid in  the diagnosis of influenza from Nasopharyngeal swab specimens and  should not be used as a sole basis for treatment. Nasal washings and  aspirates are unacceptable for Xpert Xpress SARS-CoV-2/FLU/RSV  testing. Fact Sheet for Patients: PinkCheek.be Fact Sheet  for Healthcare Providers: GravelBags.it This test is not yet approved or cleared by the [place] FDA and  has been authorized for detection and/or diagnosis of SARS-CoV-2 by  FDA under an Emergency Use Authorization (EUA). This EUA will remain  in effect (meaning this test can be used) for the duration of the  Covid-19 declaration under Section 564(b)(1) of the Act, 21  U.S.C. section 360bbb-3(b)(1), unless the authorization is  terminated or revoked. Performed at [facility], [place] [address], [place], [place] [address]   Blood Culture (routine x 2)     Status: None (Preliminary result)   Collection Time: 11/10/19 10:15 AM   Specimen: BLOOD RIGHT HAND  Result Value Ref Range Status   Specimen Description BLOOD RIGHT HAND  Final   Special Requests   Final    BOTTLES DRAWN AEROBIC AND ANAEROBIC Blood Culture results may not be optimal due to an inadequate volume of blood received in culture bottles   Culture   Final    NO GROWTH 4 DAYS Performed at [facility],  [address], [place], [place] [address]    Report Status PENDING  Incomplete  Blood Culture (routine x 2)     Status: None (Preliminary result)   Collection Time: 11/10/19 10:15 AM   Specimen: BLOOD  Result Value Ref Range Status   Specimen Description BLOOD LEFT ANTECUBITAL  Final   Special Requests   Final    BOTTLES DRAWN AEROBIC ONLY Blood Culture results may not be optimal due to an inadequate volume of blood received in culture bottles   Culture   Final    NO GROWTH 4 DAYS Performed at [facility], [place] [address] [place], [place] [address]    Report Status PENDING  Incomplete  Urine culture     Status: None   Collection Time: 11/10/19 11:05 AM   Specimen: In/Out Cath Urine  Result Value Ref Range Status   Specimen Description IN/OUT CATH URINE  Final   Special Requests NONE  Final   Culture   Final    NO GROWTH Performed at [facility], [place]  [address], [place], [place] [address]    Report Status 11/11/2019 FINAL  Final  MRSA PCR Screening     Status: None   Collection Time: 11/10/19  1:31 PM   Specimen: Nasal Mucosa; Nasopharyngeal  Result Value Ref Range Status   MRSA by PCR NEGATIVE NEGATIVE Final    Comment:        The GeneXpert MRSA Assay (FDA approved for NASAL specimens only), is one component of a comprehensive MRSA colonization surveillance program. It is not intended to diagnose MRSA infection nor to guide or monitor treatment for MRSA infections. Performed at [facility], [place] [address], [place], [place] [address]   Culture, respiratory (tracheal aspirate)     Status: None (Preliminary result)   Collection Time: 11/11/19  8:38 AM   Specimen: Tracheal Aspirate; Respiratory  Result Value Ref Range Status   Specimen Description TRACHEAL ASPIRATE  Final   Special Requests NONE  Final   Gram Stain   Final    FEW WBC PRESENT,BOTH PMN AND MONONUCLEAR FEW GRAM POSITIVE COCCI IN PAIRS IN CLUSTERS    Culture   Final    FEW YEAST CULTURE REINCUBATED FOR BETTER GROWTH Performed at [facility], [place] [address], [place], [place] [address]    Report Status PENDING  Incomplete    Radiology Reports CT Head Wo Contrast  Result Date: 11/10/2019 CLINICAL DATA:  Altered mental status. EXAM: CT HEAD WITHOUT CONTRAST TECHNIQUE: Contiguous axial images were obtained from the base of the skull through the vertex without intravenous contrast. COMPARISON:  09/16/2019 FINDINGS: Brain: No evidence of acute infarction, hemorrhage, hydrocephalus, extra-axial collection or mass lesion/mass effect. Vascular: No hyperdense vessel or unexpected calcification. Skull: Normal. Negative for fracture or focal lesion. Sinuses/Orbits: Globes and orbits are unremarkable. Dependent fluid/secretions in the maxillary sinuses. Mucosal thickening lines the ethmoid, sphenoid and right maxillary sinus. Clear mastoid air cells. Other: None.  IMPRESSION: 1. No intracranial abnormality. 2. Sinus disease with mucosal thickening. Maxillary sinuses with dependent fluid/secretions. Electronically Signed   By: Lajean Manes M.D.   On: 11/10/2019 12:34   DG Chest Port 1 View  Result Date: 11/11/2019 CLINICAL DATA:  54 year old female with history of pneumonia. EXAM: PORTABLE CHEST 1 VIEW COMPARISON:  Chest x-ray 11/10/2019. FINDINGS: An endotracheal tube is in place with tip 2.6 cm above the carina. A nasogastric tube is seen extending into the stomach, however, the tip of  the nasogastric tube extends below the lower margin of the image. Transcutaneous defibrillator pad projecting over the lower left hemithorax. Patchy multifocal ill-defined opacities and areas of interstitial prominence are asymmetrically distributed in the lungs bilaterally, most severe in the right mid to lower lung and at the left lung base. Overall, aeration has significantly improved compared to the prior examination, particularly in the right upper and left upper lobes. No pneumothorax. No evidence of pulmonary edema. Heart size appears borderline enlarged. Upper mediastinal contours are within normal limits. IMPRESSION: 1. Support apparatus, as above. 2. Multilobar pneumonia redemonstrated with significant improved aeration compared to the prior examination, as above. Electronically Signed   By: Vinnie Langton M.D.   On: 11/11/2019 09:33   DG Chest Port 1 View  Result Date: 11/10/2019 CLINICAL DATA:  CPR  ETT placement EXAM: PORTABLE CHEST 1 VIEW COMPARISON:  Chest radiograph 09/16/2019 FINDINGS: Interval intubation with endotracheal tube tip between the thoracic inlet and carina. There are extensive bilateral right greater than left interstitial and airspace opacities. No pneumothorax or large pleural effusion. Probable lower right rib fracture. IMPRESSION: 1. Interval intubation with endotracheal tube tip in appropriate position. 2. Extensive bilateral right greater than  left interstitial and airspace opacities, could represent pulmonary edema or aspiration. 3. Probable lower right rib fracture. Electronically Signed   By: Audie Pinto M.D.   On: 11/10/2019 10:27   DG Abd Portable 1V  Result Date: 11/10/2019 CLINICAL DATA:  Encounter for orogastric tube placement. EXAM: PORTABLE ABDOMEN - 1 VIEW COMPARISON:  CT abdomen/pelvis 02/23/2018 FINDINGS: Overlying cardiac monitoring leads. An enteric tube passes below the level of the left hemidiaphragm with tip terminating in the region of the stomach. No dilated loops of bowel are demonstrated to suggest small bowel obstruction mild air distention of the colon. No acute bony abnormality. IMPRESSION: Problem oriented examination demonstrates an enteric tube which passes below the level left hemidiaphragm with tip projecting in the region of the stomach. Electronically Signed   By: Kellie Simmering DO   On: 11/10/2019 14:00   DG Swallowing Func-Speech Pathology  Result Date: 11/12/2019 Objective Swallowing Evaluation: Type of Study: MBS-Modified Barium Swallow Study  Patient Details Name: Amy Villa MRN: [id number] Date of Birth: September 15, 1965 Today's Date: 11/12/2019 Time: SLP Start Time (ACUTE ONLY): 1209 -SLP Stop Time (ACUTE ONLY): 1230 SLP Time Calculation (min) (ACUTE ONLY): 21 min Past Medical History: Past Medical History: Diagnosis Date . Alcoholic hepatitis  . Aortic atherosclerosis ([place])  . Arthritis   neck . Carpal tunnel syndrome on both sides 02/2012 . Cigarette nicotine dependence  . Contact lens/glasses fitting   wears contacts or glasses . Dental crowns present   x 2 upper front . Elevated LFTs  . Fatty liver  . Fluid retention  . History of MRSA infection  . Hypertension  . Insomnia  . Migraines  . Osteoarthritis  . Parasomnia  . Wears partial dentures   lower Past Surgical History: Past Surgical History: Procedure Laterality Date . ANKLE ARTHROSCOPY WITH RECONSTRUCTION  07/2017 . BIOPSY  02/24/2018  Procedure:  BIOPSY;  Surgeon: Jackquline Denmark, MD;  Location: [facility];  Service: Endoscopy;; . CARPAL TUNNEL RELEASE  03/17/2012  Procedure: CARPAL TUNNEL RELEASE;  Surgeon: Roseanne Kaufman, MD;  Location: [place];  Service: Orthopedics;  Laterality: Bilateral;  left limited open carpal tunnel release. right carpal tunnel injection with 1cc of celestone and 2cc of marcaine.25% . CARPAL TUNNEL RELEASE  06/30/2012  Procedure: CARPAL TUNNEL RELEASE;  Surgeon: Gwyndolyn Saxon  Amedeo Plenty, MD;  Location: [place];  Service: Orthopedics;  Laterality: Right;  RIGHT LIMITED OPEN CARPAL TUNNEL RELEASE . CARPOMETACARPAL (CMC) FUSION OF THUMB Left 06/23/2013  Procedure: LEFT CARPOMETACARPEL ([place]) FUSION OF THUMB WITH AUTOGRAFT FROM RADIUS AND REPAIR AS NECESSARY;  Surgeon: Roseanne Kaufman, MD;  Location: [place];  Service: Orthopedics;  Laterality: Left; . COLONOSCOPY  2014 . ESOPHAGOGASTRODUODENOSCOPY  05/2017 . ESOPHAGOGASTRODUODENOSCOPY (EGD) WITH PROPOFOL N/A 02/24/2018  Procedure: ESOPHAGOGASTRODUODENOSCOPY (EGD) WITH PROPOFOL;  Surgeon: Jackquline Denmark, MD;  Location: [facility];  Service: Endoscopy;  Laterality: N/A; . HARDWARE REMOVAL Left 07/10/2013  Procedure: HARDWARE REMOVAL;  Surgeon: Roseanne Kaufman, MD;  Location: [facility];  Service: Orthopedics;  Laterality: Left; . INCISION AND DRAINAGE OF WOUND Left 07/10/2013  Procedure: IRRIGATION AND DEBRIDEMENT WOUND left wrist  ;  Surgeon: Roseanne Kaufman, MD;  Location: [facility];  Service: Orthopedics;  Laterality: Left; . LAPAROSCOPIC VAGINAL HYSTERECTOMY  02/24/2010 . LAPAROSCOPY  06/2010  for adhesions . LEEP    x 2 . LUMBAR DISC SURGERY  03/23/2003  left L5-S1 discectomy with microdissection . LUMBAR DISC SURGERY  05/23/2003  redo discectomy L5-S1 with microdissection HPI: 54 year old woman with a history of alcohol use and cirrhosis, depression, hypertension.  She was found altered and poorly responsive by her husband around 0530, then completely unresponsive and  pulseless at 0730.  CPR was started, patient was in asystole when EMS arrived.  The estimated time to ROSC was over 30 minutes.  Brought to the ED for further support and care ventilated and hemodynamically stabilized.  GCS on arrival 3. Pt was intubated from 4/23-4/24.   Subjective: Pt was alert and pleasant but she appeared confused Assessment / Plan / Recommendation CHL IP CLINICAL IMPRESSIONS 11/12/2019 Clinical Impression Patient presents with a mild pharyngeal phase dysphagia characterized primarily by decreased laryngeal closure s/p brief intubation resulting in deep penetration to the vocal cords without sensation, of thin liquids. This occurred consistently with use of straw which aided in larger bolus sizes, eliminated with small single cup sips. Otherwise oral phase timely and patient with full pharyngeal and upper esophageal clearance of bolus. It is approprioate to initiate a po diet at this time with aspiration precautions to mitigate risks.   SLP Visit Diagnosis Dysphagia, pharyngeal phase (R13.13) Attention and concentration deficit following -- Frontal lobe and executive function deficit following -- Impact on safety and function Mild aspiration risk   [facility] IP TREATMENT RECOMMENDATION 11/12/2019 Treatment Recommendations Therapy as outlined in treatment plan below   Prognosis 11/12/2019 Prognosis for Safe Diet Advancement Good Barriers to Reach Goals Cognitive deficits Barriers/Prognosis Comment -- [facility] IP DIET RECOMMENDATION 11/12/2019 SLP Diet Recommendations Regular solids;Thin liquid Liquid Administration via Cup;No straw Medication Administration Whole meds with puree Compensations Slow rate;Small sips/bites Postural Changes Seated upright at 90 degrees   CHL IP OTHER RECOMMENDATIONS 11/12/2019 Recommended Consults -- Oral Care Recommendations Oral care BID Other Recommendations --   [facility] IP FOLLOW UP RECOMMENDATIONS 11/12/2019 Follow up Recommendations None   CH[facility] IP FREQUENCY AND DURATION 11/12/2019  Speech Therapy Frequency (ACUTE ONLY) min 2x/week Treatment Duration 2 weeks      CHL IP ORAL PHASE 11/12/2019 Oral Phase WFL Oral - Pudding Teaspoon -- Oral - Pudding Cup -- Oral - Honey Teaspoon -- Oral - Honey Cup -- Oral - Nectar Teaspoon -- Oral - Nectar Cup -- Oral - Nectar Straw -- Oral - Thin Teaspoon -- Oral - Thin Cup -- Oral - Thin Straw -- Oral - Puree -- Oral -  Mech Soft -- Oral - Regular -- Oral - Multi-Consistency -- Oral - Pill -- Oral Phase - Comment --  [facility] IP PHARYNGEAL PHASE 11/12/2019 Pharyngeal Phase Impaired Pharyngeal- Pudding Teaspoon -- Pharyngeal -- Pharyngeal- Pudding Cup -- Pharyngeal -- Pharyngeal- Honey Teaspoon -- Pharyngeal -- Pharyngeal- Honey Cup -- Pharyngeal -- Pharyngeal- Nectar Teaspoon -- Pharyngeal -- Pharyngeal- Nectar Cup -- Pharyngeal -- Pharyngeal- Nectar Straw -- Pharyngeal -- Pharyngeal- Thin Teaspoon -- Pharyngeal -- Pharyngeal- Thin Cup WFL Pharyngeal -- Pharyngeal- Thin Straw Reduced airway/laryngeal closure;Penetration/Aspiration during swallow Pharyngeal Material enters airway, CONTACTS cords and not ejected out Pharyngeal- Puree WFL Pharyngeal -- Pharyngeal- Mechanical Soft -- Pharyngeal -- Pharyngeal- Regular WFL Pharyngeal -- Pharyngeal- Multi-consistency -- Pharyngeal -- Pharyngeal- Pill -- Pharyngeal -- Pharyngeal Comment --  CHL IP CERVICAL ESOPHAGEAL PHASE 11/12/2019 Cervical Esophageal Phase WFL Pudding Teaspoon -- Pudding Cup -- Honey Teaspoon -- Honey Cup -- Nectar Teaspoon -- Nectar Cup -- Nectar Straw -- Thin Teaspoon -- Thin Cup -- Thin Straw -- Puree -- Mechanical Soft -- Regular -- Multi-consistency -- Pill -- Cervical Esophageal Comment -- Gabriel Rainwater MA, CCC-SLP [place] Leah Meryl 11/12/2019, 12:50 PM              EEG adult  Result Date: 11/10/2019 Lora Havens, MD     11/10/2019  4:42 PM Patient Name: Amy Villa MRN: [id number] Epilepsy Attending: Lora Havens Referring Physician/Provider: Salvadore Dom, NP Date: 11/10/2019 Duration:  24.14 mins Patient history: 54 year old female status post cardiac arrest.  EEG evaluate for seizures. Level of alertness: Comatose AEDs during EEG study: None Technical aspects: This EEG study was done with scalp electrodes positioned according to the 10-20 International system of electrode placement. Electrical activity was acquired at a sampling rate of 500Hz  and reviewed with a high frequency filter of 70Hz  and a low frequency filter of 1Hz . EEG data were recorded continuously and digitally stored. Description: EEG showed continuous generalized  3 to 5 Hz theta slowing. Reactivity was not tested during this study. Photic driving was not seen during photic stimulation. Hyperventilation was not performed. Abnormality -Continuous slow, generalized IMPRESSION: This study is suggestive of profound diffuse encephalopathy, non specific to encephalopathy. No seizures or epileptiform discharges were seen throughout the recording. Lora Havens   ECHOCARDIOGRAM COMPLETE  Result Date: 11/13/2019    ECHOCARDIOGRAM REPORT   Patient Name:   Amy Villa Date of Exam: 11/13/2019 Medical Rec #:  [id number]          Height:       63.0 in Accession #:    [id number]         Weight:       137.8 lb Date of Birth:  Aug 03, 1965          BSA:          1.651 m Patient Age:    59 years           BP:           171/108 mmHg Patient Gender: F                  HR:           93 bpm. Exam Location:  Inpatient Procedure: 2D Echo and Intracardiac Opacification Agent Indications:    Cardiac arrest I46.9  History:        Patient has no prior history of Echocardiogram examinations.                 COPD;  Risk Factors:Former Smoker. Alcoholic related cirrhosis of                 the liver, Acute metabolic encephalopathy, Acute respiratory                 failure with hypoxemia.  Sonographer:    Darlina Sicilian RDCS Referring Phys: [place]  Sonographer Comments: Technically challenging study due to limited acoustic windows and  suboptimal apical window. IMPRESSIONS  1. Left ventricular ejection fraction, by estimation, is 50 to 55%. The left ventricle has low normal function. The left ventricle has no regional wall motion abnormalities. Left ventricular diastolic parameters are indeterminate.  2. Right ventricular systolic function is normal. The right ventricular size is normal. Tricuspid regurgitation signal is inadequate for assessing PA pressure.  3. The mitral valve is normal in structure. No evidence of mitral valve regurgitation. No evidence of mitral stenosis.  4. The aortic valve is tricuspid. Aortic valve regurgitation is not visualized. Unable to assess for aortic valve stenosis.  5. The inferior vena cava is normal in size with greater than 50% respiratory variability, suggesting right atrial pressure of 3 mmHg. FINDINGS  Left Ventricle: Left ventricular ejection fraction, by estimation, is 50 to 55%. The left ventricle has low normal function. The left ventricle has no regional wall motion abnormalities. Definity contrast agent was given IV to delineate the left ventricular endocardial borders. The left ventricular internal cavity size was normal in size. There is no left ventricular hypertrophy. Left ventricular diastolic parameters are indeterminate. Right Ventricle: The right ventricular size is normal. No increase in right ventricular wall thickness. Right ventricular systolic function is normal. Tricuspid regurgitation signal is inadequate for assessing PA pressure. Left Atrium: Left atrial size was not well visualized. Right Atrium: Right atrial size was not well visualized. Pericardium: There is no evidence of pericardial effusion. Mitral Valve: The mitral valve is normal in structure. Normal mobility of the mitral valve leaflets. No evidence of mitral valve regurgitation. No evidence of mitral valve stenosis. Tricuspid Valve: The tricuspid valve is normal in structure. Tricuspid valve regurgitation is trivial. No  evidence of tricuspid stenosis. Aortic Valve: The aortic valve is tricuspid. Aortic valve regurgitation is not visualized. Unable to assess for aortic valve stenosis. Pulmonic Valve: The pulmonic valve was normal in structure. Pulmonic valve regurgitation is not visualized. No evidence of pulmonic stenosis. Aorta: The aortic root is normal in size and structure. Venous: The inferior vena cava is normal in size with greater than 50% respiratory variability, suggesting right atrial pressure of 3 mmHg. IAS/Shunts: No atrial level shunt detected by color flow Doppler.  LEFT VENTRICLE PLAX 2D LVIDd:         4.50 cm LVIDs:         3.55 cm LV PW:         0.80 cm LV IVS:        0.97 cm LVOT diam:     1.80 cm LVOT Area:     2.54 cm  LV Volumes (MOD) LV vol d, MOD A2C: 134.0 ml LV vol d, MOD A4C: 133.0 ml LV vol s, MOD A2C: 49.5 ml LV vol s, MOD A4C: 52.6 ml LV SV MOD A2C:     84.5 ml LV SV MOD A4C:     133.0 ml LV SV MOD BP:      83.7 ml RIGHT VENTRICLE RV S prime:     14.40 cm/s TAPSE (M-mode): 2.3 cm LEFT ATRIUM  Index LA diam:        2.80 cm 1.70 cm/m LA Vol (A2C):   32.4 ml 19.63 ml/m LA Vol (A4C):   20.4 ml 12.36 ml/m LA Biplane Vol: 27.1 ml 16.42 ml/m   AORTA Ao Root diam: 3.10 cm MITRAL VALVE MV Area (PHT): 8.92 cm    SHUNTS MV Decel Time: 85 msec     Systemic Diam: 1.80 cm MV E velocity: 70.70 cm/s MV A velocity: 94.70 cm/s MV E/A ratio:  0.75 Cherlynn Kaiser MD Electronically signed by Cherlynn Kaiser MD Signature Date/Time: 11/13/2019/10:19:31 AM    Final       Nita Sells M.D on 11/14/2019 at 8:15 AM

## 2019-11-14 NOTE — Progress Notes (Signed)
Patient mildly confused reported in [facility] reported summer time not sure the day but got everything else correct is cooperative has no need for further sitter confusion is better. MD in see patient she is sitting up eating breakfast.

## 2019-11-14 NOTE — TOC Initial Note (Addendum)
Transition of Care [facility]) - Initial/Assessment Note    Patient Details  Name: Amy Villa MRN: [id number] Date of Birth: [birth year]  [facility] CM/SW Contact:    Zenon Mayo, RN Phone Number: 11/14/2019, 11:41 AM  Clinical Narrative:                 NCM spoke with patient over the phone, we discussed if the insurance denied CIR then will need a back up plan such as SNF or Home with [facility] services, she states she will discuss with her spouse when he gets to the hospital, so they can be thinking about it.  11:41 NCM received called from [place] with CIR stating that patient insurance states [facility] is not in network with her insurance but the [facility] with [facility] is and the [facility] is in network.  NCM contacted patient and her husband and they state she is walking really well and does not thinks she will need any inpatient rehab.  NCM asked is they will need any DME like a walker or any HH serviceds  , spouse states no they will not need any HH services or any DME.  Plan is for dc tomorrow.   Expected Discharge Plan: IP Rehab Facility Barriers to Discharge: Continued Medical Work up   Patient Goals and CMS Choice Patient states their goals for this hospitalization and ongoing recovery are:: go home      Expected Discharge Plan and Services Expected Discharge Plan: [place]   Discharge Planning Services: CM Consult   Living arrangements for the past 2 months: Single Family Home                                      Prior Living Arrangements/Services Living arrangements for the past 2 months: Single Family Home Lives with:: Spouse Patient language and need for interpreter reviewed:: Yes Do you feel safe going back to the place where you live?: Yes      Need for Family Participation in Patient Care: Yes (Comment) Care giver support system in place?: Yes (comment)   Criminal Activity/Legal Involvement  Pertinent to Current Situation/Hospitalization: No - Comment as needed  Activities of Daily Living      Permission Sought/Granted                  Emotional Assessment Appearance:: Appears stated age Attitude/Demeanor/Rapport: Engaged Affect (typically observed): Appropriate Orientation: : Oriented to Self, Oriented to Place, Oriented to  Time, Oriented to Situation Alcohol / Substance Use: Not Applicable Psych Involvement: No (comment)  Admission diagnosis:  Cardiac arrest ([place]) [I46.9] Altered mental status, unspecified altered mental status type [R41.82] Patient Active Problem List   Diagnosis Date Noted  . Cardiac arrest ([place]) 11/10/2019  . Coagulopathy ([place]) 11/10/2019  . Acute metabolic encephalopathy [id number]  . Acute respiratory failure with hypoxemia ([place]) 11/10/2019  . Aspiration pneumonia ([place]) 11/10/2019  . AKI (acute kidney injury) ([place]) 11/10/2019  . Lactic acidosis 11/10/2019  . Hyperkalemia 11/10/2019  . Hypothermia 11/10/2019  . AMS (altered mental status) 09/16/2019  . GI bleed 02/23/2018  . Substance abuse ([place]) 03/13/2017  . Aortic atherosclerosis ([place]) 12/01/2016  . Hepatic steatosis 12/01/2016  . Insomnia 12/01/2016  . Anxiety and depression 12/01/2016  . Liver cirrhosis ([place]) 11/16/2016  . Nicotine dependence 07/11/2013  . DJD (degenerative joint disease) 07/11/2013  . DDD (degenerative disc disease)  07/11/2013  . Anemia iron def 07/11/2013  . Narcotic abuse ([place]) 07/11/2013  . CMC arthritis, thumb, degenerative 06/23/2013  . Alcohol abuse 09/02/2010  . Benign essential hypertension 06/09/2005   PCP:  Patient, No Pcp Per Pharmacy:   [facility] [place], [place] - [address] [place] [address] [place] [address] [place] [address] Phone: [phone] Fax: [phone]     Social Determinants of Health (SDOH) Interventions    Readmission Risk Interventions Readmission Risk Prevention  Plan 11/14/2019  Transportation Screening Complete  PCP or Specialist Appt within 3-5 Days Complete  HRI or [place] Complete  Social Work Consult for [place] Planning/Counseling Complete  Palliative Care Screening Not Applicable  Medication Review Press photographer) Complete  Some recent data might be hidden

## 2019-11-14 NOTE — Progress Notes (Signed)
Pharmacy Antibiotic Note  Amy Villa is a 54 y.o. female admitted on 11/10/2019 with aspiration pneumonia.  Pharmacy has been consulted for unasyn dosing.   She has been on D4 of Unasyn for PNA. Her TA grew out some candida. D/w Dr. Verlon Au, we will use 5d rather than 14d of therapy. She does have some mild elevation of LFTs.    Plan: Fluconazole 200mg  PO qday x5  Height: 5\' 3"  (160 cm) Weight: 62.1 kg (136 lb 12.8 oz) IBW/kg (Calculated) : 52.4  Temp (24hrs), Avg:98.4 F (36.9 C), Min:98.1 F (36.7 C), Max:98.8 F (37.1 C)  Recent Labs  Lab 11/10/19 1015 11/10/19 1015 11/10/19 1321 11/10/19 1510 11/11/19 0541 11/11/19 0541 11/11/19 1654 11/12/19 0348 11/12/19 0535 11/12/19 1910 11/13/19 0524 11/14/19 0510  WBC 36.4*   < > 27.8*  --  14.4*  --   --  9.6  --   --  9.4 6.7  CREATININE 1.67*  --   --    < > 0.83   < > 0.49  --  0.57 0.57 0.59 0.49  LATICACIDVEN 9.3*  --  3.8*  --   --   --   --   --   --   --   --   --    < > = values in this interval not displayed.    Estimated Creatinine Clearance: 67.3 mL/min (by C-G formula based on SCr of 0.49 mg/dL).    No Known Allergies   Onnie Boer, PharmD, BCIDP, AAHIVP, CPP Infectious Disease Pharmacist 11/14/2019 6:05 PM

## 2019-11-15 LAB — CULTURE, BLOOD (ROUTINE X 2)
Culture: NO GROWTH
Culture: NO GROWTH

## 2019-11-15 LAB — COMPREHENSIVE METABOLIC PANEL
ALT: 75 U/L — ABNORMAL HIGH (ref 0–44)
AST: 31 U/L (ref 15–41)
Albumin: 3.2 g/dL — ABNORMAL LOW (ref 3.5–5.0)
Alkaline Phosphatase: 112 U/L (ref 38–126)
Anion gap: 11 (ref 5–15)
BUN: 15 mg/dL (ref 6–20)
CO2: 22 mmol/L (ref 22–32)
Calcium: 8.9 mg/dL (ref 8.9–10.3)
Chloride: 107 mmol/L (ref 98–111)
Creatinine, Ser: 0.61 mg/dL (ref 0.44–1.00)
GFR calc Af Amer: >60 mL/min (ref >60)
GFR calc non Af Amer: >60 mL/min (ref >60)
Glucose, Bld: 120 mg/dL — ABNORMAL HIGH (ref 70–99)
Potassium: 3.9 mmol/L (ref 3.5–5.1)
Sodium: 140 mmol/L (ref 135–145)
Total Bilirubin: 0.5 mg/dL (ref 0.3–1.2)
Total Protein: 6.3 g/dL — ABNORMAL LOW (ref 6.5–8.1)

## 2019-11-15 LAB — GLUCOSE, CAPILLARY
Glucose-Capillary: 101 mg/dL — ABNORMAL HIGH (ref 70–99)
Glucose-Capillary: 113 mg/dL — ABNORMAL HIGH (ref 70–99)
Glucose-Capillary: 124 mg/dL — ABNORMAL HIGH (ref 70–99)
Glucose-Capillary: 130 mg/dL — ABNORMAL HIGH (ref 70–99)

## 2019-11-15 LAB — CBC
HCT: 35.9 % — ABNORMAL LOW (ref 36.0–46.0)
Hemoglobin: 11 g/dL — ABNORMAL LOW (ref 12.0–15.0)
MCH: 26.6 pg (ref 26.0–34.0)
MCHC: 30.6 g/dL (ref 30.0–36.0)
MCV: 86.9 fL (ref 80.0–100.0)
Platelets: 144 10*3/uL — ABNORMAL LOW (ref 150–400)
RBC: 4.13 MIL/uL (ref 3.87–5.11)
RDW: 22.5 % — ABNORMAL HIGH (ref 11.5–15.5)
WBC: 11.7 10*3/uL — ABNORMAL HIGH (ref 4.0–10.5)
nRBC: 0 % (ref 0.0–0.2)

## 2019-11-15 LAB — MAGNESIUM: Magnesium: 1.6 mg/dL — ABNORMAL LOW (ref 1.7–2.4)

## 2019-11-15 MED ORDER — GUAIFENESIN ER 600 MG PO TB12: 600.0000 mg | ORAL_TABLET | Freq: Two times a day (BID) | ORAL | 0 refills | Status: AC

## 2019-11-15 MED ORDER — AMOXICILLIN-POT CLAVULANATE 875-125 MG PO TABS
1.0000 | ORAL_TABLET | Freq: Two times a day (BID) | ORAL | 0 refills | Status: AC
Start: 2019-11-15 — End: 2019-11-19

## 2019-11-15 MED ORDER — FLUCONAZOLE 200 MG PO TABS: 200.0000 mg | ORAL_TABLET | Freq: Every day | ORAL | 0 refills | Status: DC

## 2019-11-15 MED ORDER — THIAMINE HCL 100 MG PO TABS: 100.0000 mg | ORAL_TABLET | Freq: Every day | ORAL | Status: DC

## 2019-11-15 MED FILL — AMOX-CLAV 875-125 MG TABLET: 875-125 | 4 days supply | Qty: 8 | Fill #0

## 2019-11-15 MED FILL — FLUCONAZOLE 200 MG TABLET: 200 | 4 days supply | Qty: 4 | Fill #0

## 2019-11-15 NOTE — Discharge Summary (Signed)
Physician Discharge Summary  Amy Villa [id number] DOB: [birth year] DOA: 11/10/2019  PCP: Patient, No Pcp Per  Admit date: 11/10/2019 Discharge date: 11/15/2019  Admitted From: Home  Disposition:  Home   Recommendations for Outpatient Follow-up:  1. Follow up with PCP in 1-2 weeks 2. Please obtain BMP/CBC in one week your next doctors visit.  3. Outpatient Psych follow up 4. Take oral Augmentin until 5/2 5. Fluconazole for 3 more days 6. Mucinex prescribed twice daily 7. Continue daily use of incentive spirometer and flutter valve as advised 8. Discontinued Flexeril, Neurontin, thiamine.  Discontinued Seroquel 200 mg at bedtime.  Continue Seroquel 25 mg twice daily 9. Discuss outpatient resumption of Fosamax with PCP  Home Health: Equipment/Devices: Discharge Condition: Stable CODE STATUS:  Diet recommendation:   Brief/Interim Summary:Brief Narrative:  54 year old with history of tobacco use, anxiety/depression, insomnia, alcohol cirrhosis with esophageal varices, COPD, GERD, HTN, iron deficiency anemia was in routine health until 4/22.  Following day her husband checked up on her and apparently patient might have overmedicated herself and was found to be pulseless and apneic.  CPR was initiated, EMS arrived and noted she was in asystole for about 30 minutes there after achieving ROSC.  She was initially admitted to the ICU, slowly mentation improved and she also self extubated on 4/24.  She was seen by psychiatry who recommended outpatient follow-up.   Assessment & Plan:   Principal Problem:   Cardiac arrest [facility]) Active Problems:   Anemia iron def   Liver cirrhosis (HCC)   Anxiety and depression   Substance abuse (HCC)   Coagulopathy (HCC)   Acute metabolic encephalopathy   Acute respiratory failure with hypoxemia (HCC)   Aspiration pneumonia (HCC)   AKI (acute kidney injury) (HCC)   Lactic acidosis   Hyperkalemia   Hypothermia   Cardiopulmonary arrest,  asystole initial rhythm.  Suspect secondary to drug overdose -Polypharmacy-recently started on Seroquel.  Also she is on Neurontin, Prozac, BuSpar, Flexeril -Echo shows EF 50-55% -Continue to monitor on telemetry.No significant events on telemetry  Acute hypoxic respiratory failure following cardiopulmonary arrest Aspiration pneumonia likely during encephalopathy Right lower rib fracture from CPR -She was successfully extubated 4/24, currently on nasal cannula. -Oral Augmentin until 5/2 -Aggressive use of incentive spirometer and flutter valve -Aspiration precaution -Speech evaluation-regular diet  Acute metabolic encephalopathy -Resolved  History of alcohol related liver cirrhosis without ascites but with history of esophageal varices Mild coagulopathy  -NR level mildly elevated at 1.1 -Continue lactulose titrate about 3 bowel movements daily - elevated LFTs most likely related cardiac arrest, will continue to trend -Patient had endoscopy, and liver ultrasound done in the past showing some evidence of cirrhosis and low-grade varices. -Propranolol resumed -Continue with lactulose.  Hxanxiety and depression - Continue BuSpar and Prozac, hold Flexeril and Neurontin.  Seen by psychiatry, recommending outpatient follow-up Seroquel dose reduced to 25 mg twice daily.  Discontinued 200 mg bedtime dose  History of gastroesophageal reflux disease -PPI  History of iron deficiency anemia -Iron supplements  Hypokalemia -Repleted     Discharge Diagnoses:  Principal Problem:   Cardiac arrest [facility]) Active Problems:   Anemia iron def   Liver cirrhosis (HCC)   Anxiety and depression   Substance abuse (HCC)   Coagulopathy (HCC)   Acute metabolic encephalopathy   Acute respiratory failure with hypoxemia (HCC)   Aspiration pneumonia (HCC)   AKI (acute kidney injury) ([place])   Lactic acidosis   Hyperkalemia    Hypothermia    Consultations:  Psychiatry  Subjective: Feels great no complaints  Discharge Exam: Vitals:   11/15/19 0937 11/15/19 1152  BP: (!) 158/83 (!) 160/90  Pulse: 70 (!) 59  Resp: 16 20  Temp: 99.2 F (37.3 C) 98.5 F (36.9 C)  SpO2: 100% 100%   Vitals:   11/15/19 0426 11/15/19 0724 11/15/19 0937 11/15/19 1152  BP: (!) 170/93 (!) 166/89 (!) 158/83 (!) 160/90  Pulse: 66 67 70 (!) 59  Resp: 19 20 16 20   Temp: 98.5 F (36.9 C) 98.4 F (36.9 C) 99.2 F (37.3 C) 98.5 F (36.9 C)  TempSrc: Oral Oral Oral Oral  SpO2: 95% 98% 100% 100%  Weight: 63.7 kg     Height:        General: Pt is alert, awake, not in acute distress Cardiovascular: RRR, S1/S2 +, no rubs, no gallops Respiratory: CTA bilaterally, no wheezing, no rhonchi Abdominal: Soft, NT, ND, bowel sounds + Extremities: no edema, no cyanosis  Discharge Instructions   Allergies as of 11/15/2019   No Known Allergies     Medication List    STOP taking these medications   alendronate 70 MG tablet Commonly known as: FOSAMAX   cyclobenzaprine 10 MG tablet Commonly known as: FLEXERIL   fluticasone 50 MCG/ACT nasal spray Commonly known as: FLONASE   gabapentin 100 MG capsule Commonly known as: NEURONTIN   gabapentin 300 MG capsule Commonly known as: NEURONTIN   ondansetron 4 MG disintegrating tablet Commonly known as: ZOFRAN-ODT   thiamine 100 MG tablet     TAKE these medications   Artificial Tears 1.4 % ophthalmic solution Generic drug: polyvinyl alcohol Place 1 drop into both eyes daily as needed for dry eyes.   budesonide-formoterol 160-4.5 MCG/ACT inhaler Commonly known as: SYMBICORT Inhale 2 puffs into the lungs 2 (two) times daily.   busPIRone 15 MG tablet Commonly known as: BUSPAR TAKE 1 TABLET(15 MG) BY MOUTH TWICE DAILY What changed: See the new instructions.   clotrimazole-betamethasone cream Commonly known as: LOTRISONE Apply 1 application topically 2 (two) times  daily. What changed:   when to take this  reasons to take this   FeroSul 325 (65 FE) MG tablet Generic drug: ferrous sulfate Take 325 mg by mouth daily.   fluconazole 200 MG tablet Commonly known as: DIFLUCAN Take 1 tablet (200 mg total) by mouth daily at 6 PM.   FLUoxetine 40 MG capsule Commonly known as: PROZAC Take 1 capsule (40 mg total) by mouth daily.   folic acid 1 MG tablet Commonly known as: FOLVITE Take 1 tablet (1 mg total) by mouth daily.   guaiFENesin 600 MG 12 hr tablet Commonly known as: MUCINEX Take 1 tablet (600 mg total) by mouth 2 (two) times daily.   lactulose 10 GM/15ML solution Commonly known as: CHRONULAC TAKE 30ML BY MOUTH EVERY DAY What changed: See the new instructions.   pantoprazole 40 MG tablet Commonly known as: PROTONIX TAKE 1 TABLET(40 MG) BY MOUTH DAILY BEFORE BREAKFAST What changed: See the new instructions.   propranolol 20 MG tablet Commonly known as: INDERAL TAKE 1 TABLET(20 MG) BY MOUTH TWICE DAILY What changed: See the new instructions.   QUEtiapine 25 MG tablet Commonly known as: SEROQUEL Take 25 mg by mouth 2 (two) times daily as needed (anxiety). What changed: Another medication with the same name was removed. Continue taking this medication, and follow the directions you see here.   Ventolin HFA 108 (90 Base) MCG/ACT inhaler Generic drug: albuterol INHALE 2 PUFFS INTO THE LUNGS EVERY  6 HOURS AS NEEDED FOR WHEEZING OR SHORTNESS OF BREATH What changed: See the new instructions.   Vitamin D (Ergocalciferol) 1.25 MG (50000 UNIT) Caps capsule Commonly known as: DRISDOL Take 50,000 Units by mouth every Monday.      Follow-up Information    Heywood Bene, PA-C Follow up.   Specialty: Physician Assistant Why: please make follow up apt Contact information: [address] [place] [address] [phone]        Hampton Abbot, MD. Schedule an appointment as soon as possible for a visit in 1 week(s).    Specialty: Psychiatry Contact information: [place] [address] [phone]          No Known Allergies  You were cared for by a hospitalist during your hospital stay. If you have any questions about your discharge medications or the care you received while you were in the hospital after you are discharged, you can call the unit and asked to speak with the hospitalist on call if the hospitalist that took care of you is not available. Once you are discharged, your primary care physician will handle any further medical issues. Please note that no refills for any discharge medications will be authorized once you are discharged, as it is imperative that you return to your primary care physician (or establish a relationship with a primary care physician if you do not have one) for your aftercare needs so that they can reassess your need for medications and monitor your lab values.   Procedures/Studies: CT Head Wo Contrast  Result Date: 11/10/2019 CLINICAL DATA:  Altered mental status. EXAM: CT HEAD WITHOUT CONTRAST TECHNIQUE: Contiguous axial images were obtained from the base of the skull through the vertex without intravenous contrast. COMPARISON:  09/16/2019 FINDINGS: Brain: No evidence of acute infarction, hemorrhage, hydrocephalus, extra-axial collection or mass lesion/mass effect. Vascular: No hyperdense vessel or unexpected calcification. Skull: Normal. Negative for fracture or focal lesion. Sinuses/Orbits: Globes and orbits are unremarkable. Dependent fluid/secretions in the maxillary sinuses. Mucosal thickening lines the ethmoid, sphenoid and right maxillary sinus. Clear mastoid air cells. Other: None. IMPRESSION: 1. No intracranial abnormality. 2. Sinus disease with mucosal thickening. Maxillary sinuses with dependent fluid/secretions. Electronically Signed   By: Lajean Manes M.D.   On: 11/10/2019 12:34   DG Chest Port 1 View  Result Date: 11/11/2019 CLINICAL DATA:   54 year old female with history of pneumonia. EXAM: PORTABLE CHEST 1 VIEW COMPARISON:  Chest x-ray 11/10/2019. FINDINGS: An endotracheal tube is in place with tip 2.6 cm above the carina. A nasogastric tube is seen extending into the stomach, however, the tip of the nasogastric tube extends below the lower margin of the image. Transcutaneous defibrillator pad projecting over the lower left hemithorax. Patchy multifocal ill-defined opacities and areas of interstitial prominence are asymmetrically distributed in the lungs bilaterally, most severe in the right mid to lower lung and at the left lung base. Overall, aeration has significantly improved compared to the prior examination, particularly in the right upper and left upper lobes. No pneumothorax. No evidence of pulmonary edema. Heart size appears borderline enlarged. Upper mediastinal contours are within normal limits. IMPRESSION: 1. Support apparatus, as above. 2. Multilobar pneumonia redemonstrated with significant improved aeration compared to the prior examination, as above. Electronically Signed   By: Vinnie Langton M.D.   On: 11/11/2019 09:33   DG Chest Port 1 View  Result Date: 11/10/2019 CLINICAL DATA:  CPR  ETT placement EXAM: PORTABLE CHEST 1 VIEW COMPARISON:  Chest radiograph 09/16/2019 FINDINGS: Interval intubation with endotracheal tube tip between the thoracic inlet and carina. There are extensive bilateral right greater than left interstitial and airspace opacities. No pneumothorax or large pleural effusion. Probable lower right rib fracture. IMPRESSION: 1. Interval intubation with endotracheal tube tip in appropriate position. 2. Extensive bilateral right greater than left interstitial and airspace opacities, could represent pulmonary edema or aspiration. 3. Probable lower right rib fracture. Electronically Signed   By: Audie Pinto M.D.   On: 11/10/2019 10:27   DG Abd Portable 1V  Result Date: 11/10/2019 CLINICAL DATA:  Encounter  for orogastric tube placement. EXAM: PORTABLE ABDOMEN - 1 VIEW COMPARISON:  CT abdomen/pelvis 02/23/2018 FINDINGS: Overlying cardiac monitoring leads. An enteric tube passes below the level of the left hemidiaphragm with tip terminating in the region of the stomach. No dilated loops of bowel are demonstrated to suggest small bowel obstruction mild air distention of the colon. No acute bony abnormality. IMPRESSION: Problem oriented examination demonstrates an enteric tube which passes below the level left hemidiaphragm with tip projecting in the region of the stomach. Electronically Signed   By: Kellie Simmering DO   On: 11/10/2019 14:00   DG Swallowing Func-Speech Pathology  Result Date: 11/12/2019 Objective Swallowing Evaluation: Type of Study: MBS-Modified Barium Swallow Study  Patient Details Name: CANTRECE LINHARES MRN: [id number] Date of Birth: Oct 31, 1965 Today's Date: 11/12/2019 Time: SLP Start Time (ACUTE ONLY): 1209 -SLP Stop Time (ACUTE ONLY): 1230 SLP Time Calculation (min) (ACUTE ONLY): 21 min Past Medical History: Past Medical History: Diagnosis Date . Alcoholic hepatitis  . Aortic atherosclerosis ([place])  . Arthritis   neck . Carpal tunnel syndrome on both sides 02/2012 . Cigarette nicotine dependence  . Contact lens/glasses fitting   wears contacts or glasses . Dental crowns present   x 2 upper front . Elevated LFTs  . Fatty liver  . Fluid retention  . History of MRSA infection  . Hypertension  . Insomnia  . Migraines  . Osteoarthritis  . Parasomnia  . Wears partial dentures   lower Past Surgical History: Past Surgical History: Procedure Laterality Date . ANKLE ARTHROSCOPY WITH RECONSTRUCTION  07/2017 . BIOPSY  02/24/2018  Procedure: BIOPSY;  Surgeon: Jackquline Denmark, MD;  Location: [facility];  Service: Endoscopy;; . CARPAL TUNNEL RELEASE  03/17/2012  Procedure: CARPAL TUNNEL RELEASE;  Surgeon: Roseanne Kaufman, MD;  Location: [place];  Service: Orthopedics;  Laterality: Bilateral;  left  limited open carpal tunnel release. right carpal tunnel injection with 1cc of celestone and 2cc of marcaine.25% . CARPAL TUNNEL RELEASE  06/30/2012  Procedure: CARPAL TUNNEL RELEASE;  Surgeon: Roseanne Kaufman, MD;  Location: [place];  Service: Orthopedics;  Laterality: Right;  RIGHT LIMITED OPEN CARPAL TUNNEL RELEASE . CARPOMETACARPAL ([facility]) FUSION OF THUMB Left 06/23/2013  Procedure: LEFT CARPOMETACARPEL ([place]) FUSION OF THUMB WITH AUTOGRAFT FROM RADIUS AND REPAIR AS NECESSARY;  Surgeon: Roseanne Kaufman, MD;  Location: [place];  Service: Orthopedics;  Laterality: Left; . COLONOSCOPY  2014 . ESOPHAGOGASTRODUODENOSCOPY  05/2017 . ESOPHAGOGASTRODUODENOSCOPY (EGD) WITH PROPOFOL N/A 02/24/2018  Procedure: ESOPHAGOGASTRODUODENOSCOPY (EGD) WITH PROPOFOL;  Surgeon: Jackquline Denmark, MD;  Location: [facility];  Service: Endoscopy;  Laterality: N/A; . HARDWARE REMOVAL Left 07/10/2013  Procedure: HARDWARE REMOVAL;  Surgeon: Roseanne Kaufman, MD;  Location: [facility];  Service: Orthopedics;  Laterality: Left; . INCISION AND DRAINAGE OF WOUND Left 07/10/2013  Procedure: IRRIGATION AND DEBRIDEMENT WOUND left wrist  ;  Surgeon: Roseanne Kaufman, MD;  Location: [facility];  Service:  Orthopedics;  Laterality: Left; . LAPAROSCOPIC VAGINAL HYSTERECTOMY  02/24/2010 . LAPAROSCOPY  06/2010  for adhesions . LEEP    x 2 . LUMBAR DISC SURGERY  03/23/2003  left L5-S1 discectomy with microdissection . LUMBAR DISC SURGERY  05/23/2003  redo discectomy L5-S1 with microdissection HPI: 54 year old woman with a history of alcohol use and cirrhosis, depression, hypertension.  She was found altered and poorly responsive by her husband around 0530, then completely unresponsive and pulseless at 0730.  CPR was started, patient was in asystole when EMS arrived.  The estimated time to ROSC was over 30 minutes.  Brought to the ED for further support and care ventilated and hemodynamically stabilized.  GCS on arrival 3. Pt was intubated from 4/23-4/24.    Subjective: Pt was alert and pleasant but she appeared confused Assessment / Plan / Recommendation CHL IP CLINICAL IMPRESSIONS 11/12/2019 Clinical Impression Patient presents with a mild pharyngeal phase dysphagia characterized primarily by decreased laryngeal closure s/p brief intubation resulting in deep penetration to the vocal cords without sensation, of thin liquids. This occurred consistently with use of straw which aided in larger bolus sizes, eliminated with small single cup sips. Otherwise oral phase timely and patient with full pharyngeal and upper esophageal clearance of bolus. It is approprioate to initiate a po diet at this time with aspiration precautions to mitigate risks.   SLP Visit Diagnosis Dysphagia, pharyngeal phase (R13.13) Attention and concentration deficit following -- Frontal lobe and executive function deficit following -- Impact on safety and function Mild aspiration risk   CHL IP TREATMENT RECOMMENDATION 11/12/2019 Treatment Recommendations Therapy as outlined in treatment plan below   Prognosis 11/12/2019 Prognosis for Safe Diet Advancement Good Barriers to Reach Goals Cognitive deficits Barriers/Prognosis Comment -- CHL IP DIET RECOMMENDATION 11/12/2019 SLP Diet Recommendations Regular solids;Thin liquid Liquid Administration via Cup;No straw Medication Administration Whole meds with puree Compensations Slow rate;Small sips/bites Postural Changes Seated upright at 90 degrees   CHL IP OTHER RECOMMENDATIONS 11/12/2019 Recommended Consults -- Oral Care Recommendations Oral care BID Other Recommendations --   CHL IP FOLLOW UP RECOMMENDATIONS 11/12/2019 Follow up Recommendations None   CHL IP FREQUENCY AND DURATION 11/12/2019 Speech Therapy Frequency (ACUTE ONLY) min 2x/week Treatment Duration 2 weeks      CHL IP ORAL PHASE 11/12/2019 Oral Phase WFL Oral - Pudding Teaspoon -- Oral - Pudding Cup -- Oral - Honey Teaspoon -- Oral - Honey Cup -- Oral - Nectar Teaspoon -- Oral - Nectar Cup -- Oral -  Nectar Straw -- Oral - Thin Teaspoon -- Oral - Thin Cup -- Oral - Thin Straw -- Oral - Puree -- Oral - Mech Soft -- Oral - Regular -- Oral - Multi-Consistency -- Oral - Pill -- Oral Phase - Comment --  CHL IP PHARYNGEAL PHASE 11/12/2019 Pharyngeal Phase Impaired Pharyngeal- Pudding Teaspoon -- Pharyngeal -- Pharyngeal- Pudding Cup -- Pharyngeal -- Pharyngeal- Honey Teaspoon -- Pharyngeal -- Pharyngeal- Honey Cup -- Pharyngeal -- Pharyngeal- Nectar Teaspoon -- Pharyngeal -- Pharyngeal- Nectar Cup -- Pharyngeal -- Pharyngeal- Nectar Straw -- Pharyngeal -- Pharyngeal- Thin Teaspoon -- Pharyngeal -- Pharyngeal- Thin Cup WFL Pharyngeal -- Pharyngeal- Thin Straw Reduced airway/laryngeal closure;Penetration/Aspiration during swallow Pharyngeal Material enters airway, CONTACTS cords and not ejected out Pharyngeal- Puree WFL Pharyngeal -- Pharyngeal- Mechanical Soft -- Pharyngeal -- Pharyngeal- Regular WFL Pharyngeal -- Pharyngeal- Multi-consistency -- Pharyngeal -- Pharyngeal- Pill -- Pharyngeal -- Pharyngeal Comment --  CHL IP CERVICAL ESOPHAGEAL PHASE 11/12/2019 Cervical Esophageal Phase WFL Pudding Teaspoon -- Pudding Cup -- Honey Teaspoon --  Honey Cup -- Nectar Teaspoon -- Nectar Cup -- Nectar Straw -- Thin Teaspoon -- Thin Cup -- Thin Straw -- Puree -- Mechanical Soft -- Regular -- Multi-consistency -- Pill -- Cervical Esophageal Comment -- Gabriel Rainwater MA, CCC-SLP [place] Leah Meryl 11/12/2019, 12:50 PM              EEG adult  Result Date: 11/10/2019 Lora Havens, MD     11/10/2019  4:42 PM Patient Name: OLEAN BASHER MRN: [id number] Epilepsy Attending: Lora Havens Referring Physician/Provider: Salvadore Dom, NP Date: 11/10/2019 Duration: 24.14 mins Patient history: 54 year old female status post cardiac arrest.  EEG evaluate for seizures. Level of alertness: Comatose AEDs during EEG study: None Technical aspects: This EEG study was done with scalp electrodes positioned according to the 10-20 International  system of electrode placement. Electrical activity was acquired at a sampling rate of 500Hz  and reviewed with a high frequency filter of 70Hz  and a low frequency filter of 1Hz . EEG data were recorded continuously and digitally stored. Description: EEG showed continuous generalized  3 to 5 Hz theta slowing. Reactivity was not tested during this study. Photic driving was not seen during photic stimulation. Hyperventilation was not performed. Abnormality -Continuous slow, generalized IMPRESSION: This study is suggestive of profound diffuse encephalopathy, non specific to encephalopathy. No seizures or epileptiform discharges were seen throughout the recording. Lora Havens   ECHOCARDIOGRAM COMPLETE  Result Date: 11/13/2019    ECHOCARDIOGRAM REPORT   Patient Name:   SURENITY SHAYA Date of Exam: 11/13/2019 Medical Rec #:  [id number]          Height:       63.0 in Accession #:    [id number]         Weight:       137.8 lb Date of Birth:  11-22-1965          BSA:          1.651 m Patient Age:    57 years           BP:           171/108 mmHg Patient Gender: F                  HR:           93 bpm. Exam Location:  Inpatient Procedure: 2D Echo and Intracardiac Opacification Agent Indications:    Cardiac arrest I46.9  History:        Patient has no prior history of Echocardiogram examinations.                 COPD; Risk Factors:Former Smoker. Alcoholic related cirrhosis of                 the liver, Acute metabolic encephalopathy, Acute respiratory                 failure with hypoxemia.  Sonographer:    Darlina Sicilian RDCS Referring Phys: [place]  Sonographer Comments: Technically challenging study due to limited acoustic windows and suboptimal apical window. IMPRESSIONS  1. Left ventricular ejection fraction, by estimation, is 50 to 55%. The left ventricle has low normal function. The left ventricle has no regional wall motion abnormalities. Left ventricular diastolic parameters are indeterminate.  2.  Right ventricular systolic function is normal. The right ventricular size is normal. Tricuspid regurgitation signal is inadequate for assessing PA pressure.  3. The mitral valve is normal in structure. No evidence of mitral  valve regurgitation. No evidence of mitral stenosis.  4. The aortic valve is tricuspid. Aortic valve regurgitation is not visualized. Unable to assess for aortic valve stenosis.  5. The inferior vena cava is normal in size with greater than 50% respiratory variability, suggesting right atrial pressure of 3 mmHg. FINDINGS  Left Ventricle: Left ventricular ejection fraction, by estimation, is 50 to 55%. The left ventricle has low normal function. The left ventricle has no regional wall motion abnormalities. Definity contrast agent was given IV to delineate the left ventricular endocardial borders. The left ventricular internal cavity size was normal in size. There is no left ventricular hypertrophy. Left ventricular diastolic parameters are indeterminate. Right Ventricle: The right ventricular size is normal. No increase in right ventricular wall thickness. Right ventricular systolic function is normal. Tricuspid regurgitation signal is inadequate for assessing PA pressure. Left Atrium: Left atrial size was not well visualized. Right Atrium: Right atrial size was not well visualized. Pericardium: There is no evidence of pericardial effusion. Mitral Valve: The mitral valve is normal in structure. Normal mobility of the mitral valve leaflets. No evidence of mitral valve regurgitation. No evidence of mitral valve stenosis. Tricuspid Valve: The tricuspid valve is normal in structure. Tricuspid valve regurgitation is trivial. No evidence of tricuspid stenosis. Aortic Valve: The aortic valve is tricuspid. Aortic valve regurgitation is not visualized. Unable to assess for aortic valve stenosis. Pulmonic Valve: The pulmonic valve was normal in structure. Pulmonic valve regurgitation is not visualized. No  evidence of pulmonic stenosis. Aorta: The aortic root is normal in size and structure. Venous: The inferior vena cava is normal in size with greater than 50% respiratory variability, suggesting right atrial pressure of 3 mmHg. IAS/Shunts: No atrial level shunt detected by color flow Doppler.  LEFT VENTRICLE PLAX 2D LVIDd:         4.50 cm LVIDs:         3.55 cm LV PW:         0.80 cm LV IVS:        0.97 cm LVOT diam:     1.80 cm LVOT Area:     2.54 cm  LV Volumes (MOD) LV vol d, MOD A2C: 134.0 ml LV vol d, MOD A4C: 133.0 ml LV vol s, MOD A2C: 49.5 ml LV vol s, MOD A4C: 52.6 ml LV SV MOD A2C:     84.5 ml LV SV MOD A4C:     133.0 ml LV SV MOD BP:      83.7 ml RIGHT VENTRICLE RV S prime:     14.40 cm/s TAPSE (M-mode): 2.3 cm LEFT ATRIUM             Index LA diam:        2.80 cm 1.70 cm/m LA Vol (A2C):   32.4 ml 19.63 ml/m LA Vol (A4C):   20.4 ml 12.36 ml/m LA Biplane Vol: 27.1 ml 16.42 ml/m   AORTA Ao Root diam: 3.10 cm MITRAL VALVE MV Area (PHT): 8.92 cm    SHUNTS MV Decel Time: 85 msec     Systemic Diam: 1.80 cm MV E velocity: 70.70 cm/s MV A velocity: 94.70 cm/s MV E/A ratio:  0.75 Cherlynn Kaiser MD Electronically signed by Cherlynn Kaiser MD Signature Date/Time: 11/13/2019/10:19:31 AM    Final       The results of significant diagnostics from this hospitalization (including imaging, microbiology, ancillary and laboratory) are listed below for reference.     Microbiology: Recent Results (from the past 240 hour(s))  Respiratory Panel by RT PCR (Flu A&B, Covid) - Nasopharyngeal Swab     Status: None   Collection Time: 11/10/19 10:15 AM   Specimen: Nasopharyngeal Swab  Result Value Ref Range Status   SARS Coronavirus 2 by RT PCR NEGATIVE NEGATIVE Final    Comment: (NOTE) SARS-CoV-2 target nucleic acids are NOT DETECTED. The SARS-CoV-2 RNA is generally detectable in upper respiratoy specimens during the acute phase of infection. The lowest concentration of SARS-CoV-2 viral copies this assay can  detect is 131 copies/mL. A negative result does not preclude SARS-Cov-2 infection and should not be used as the sole basis for treatment or other patient management decisions. A negative result may occur with  improper specimen collection/handling, submission of specimen other than nasopharyngeal swab, presence of viral mutation(s) within the areas targeted by this assay, and inadequate number of viral copies (<131 copies/mL). A negative result must be combined with clinical observations, patient history, and epidemiological information. The expected result is Negative. Fact Sheet for Patients:  PinkCheek.be Fact Sheet for Healthcare Providers:  GravelBags.it This test is not yet ap proved or cleared by the [place] FDA and  has been authorized for detection and/or diagnosis of SARS-CoV-2 by FDA under an Emergency Use Authorization (EUA). This EUA will remain  in effect (meaning this test can be used) for the duration of the COVID-19 declaration under Section 564(b)(1) of the Act, 21 U.S.C. section 360bbb-3(b)(1), unless the authorization is terminated or revoked sooner.    Influenza A by PCR NEGATIVE NEGATIVE Final   Influenza B by PCR NEGATIVE NEGATIVE Final    Comment: (NOTE) The Xpert Xpress SARS-CoV-2/FLU/RSV assay is intended as an aid in  the diagnosis of influenza from Nasopharyngeal swab specimens and  should not be used as a sole basis for treatment. Nasal washings and  aspirates are unacceptable for Xpert Xpress SARS-CoV-2/FLU/RSV  testing. Fact Sheet for Patients: PinkCheek.be Fact Sheet for Healthcare Providers: GravelBags.it This test is not yet approved or cleared by the [place] FDA and  has been authorized for detection and/or diagnosis of SARS-CoV-2 by  FDA under an Emergency Use Authorization (EUA). This EUA will remain  in effect (meaning  this test can be used) for the duration of the  Covid-19 declaration under Section 564(b)(1) of the Act, 21  U.S.C. section 360bbb-3(b)(1), unless the authorization is  terminated or revoked. Performed at [facility], [place] [address] [place], [place] [address]   Blood Culture (routine x 2)     Status: None   Collection Time: 11/10/19 10:15 AM   Specimen: BLOOD RIGHT HAND  Result Value Ref Range Status   Specimen Description BLOOD RIGHT HAND  Final   Special Requests   Final    BOTTLES DRAWN AEROBIC AND ANAEROBIC Blood Culture results may not be optimal due to an inadequate volume of blood received in culture bottles   Culture   Final    NO GROWTH 5 DAYS Performed at [facility], [place] [address] [place], [place] [address]    Report Status 11/15/2019 FINAL  Final  Blood Culture (routine x 2)     Status: None   Collection Time: 11/10/19 10:15 AM   Specimen: BLOOD  Result Value Ref Range Status   Specimen Description BLOOD LEFT ANTECUBITAL  Final   Special Requests   Final    BOTTLES DRAWN AEROBIC ONLY Blood Culture results may not be optimal due to an inadequate volume of blood received in culture bottles  Culture   Final    NO GROWTH 5 DAYS Performed at [facility], [place] [address], [place], [place] [address]    Report Status 11/15/2019 FINAL  Final  Urine culture     Status: None   Collection Time: 11/10/19 11:05 AM   Specimen: In/Out Cath Urine  Result Value Ref Range Status   Specimen Description IN/OUT CATH URINE  Final   Special Requests NONE  Final   Culture   Final    NO GROWTH Performed at [facility], [place] [address] [place], [place] [address]    Report Status 11/11/2019 FINAL  Final  MRSA PCR Screening     Status: None   Collection Time: 11/10/19  1:31 PM   Specimen: Nasal Mucosa; Nasopharyngeal  Result Value Ref Range Status   MRSA by PCR NEGATIVE NEGATIVE Final    Comment:        The GeneXpert MRSA Assay (FDA approved for NASAL  specimens only), is one component of a comprehensive MRSA colonization surveillance program. It is not intended to diagnose MRSA infection nor to guide or monitor treatment for MRSA infections. Performed at [facility], [place] [address], [place], [place] [address]   Culture, respiratory (tracheal aspirate)     Status: None   Collection Time: 11/11/19  8:38 AM   Specimen: Tracheal Aspirate; Respiratory  Result Value Ref Range Status   Specimen Description TRACHEAL ASPIRATE  Final   Special Requests NONE  Final   Gram Stain   Final    FEW WBC PRESENT,BOTH PMN AND MONONUCLEAR FEW GRAM POSITIVE COCCI IN PAIRS IN CLUSTERS Performed at [facility], [address] [place],  [address]    Culture FEW CANDIDA ALBICANS  Final   Report Status 11/14/2019 FINAL  Final     Labs: BNP (last 3 results) Recent Labs    11/10/19 1015  BNP 0000000*   Basic Metabolic Panel: Recent Labs  Lab 11/11/19 0541 11/11/19 0607 11/12/19 0535 11/12/19 1910 11/13/19 0524 11/14/19 0510 11/15/19 0704  NA 138   < > 141 138 141 140 140  K 3.9   < > 2.9* 3.6 3.4* 4.6 3.9  CL 108   < > 103 101 106 109 107  CO2 22   < > 27 24 22  19* 22  GLUCOSE 133*   < > 118* 102* 124* 163* 120*  BUN 12   < > <5* <5* 5* 10 15  CREATININE 0.83   < > 0.57 0.57 0.59 0.49 0.61  CALCIUM 8.3*   < > 8.7* 8.9 8.7* 9.3 8.9  MG 2.0  --  1.7  --   --  1.8 1.6*  PHOS 3.1  --   --   --   --   --   --    < > = values in this interval not displayed.   Liver Function Tests: Recent Labs  Lab 11/10/19 1015 11/11/19 0716 11/13/19 0524 11/14/19 0510 11/15/19 0704  AST 318* 341* 89* 45* 31  ALT 252* 294* 154* 108* 75*  ALKPHOS 173* 105 153* 149* 112  BILITOT 0.7 0.7 1.7* 0.8 0.5  PROT 6.8 6.0* 6.7 7.0 6.3*  ALBUMIN 3.7 3.1* 3.4* 3.4* 3.2*   No results for input(s): LIPASE, AMYLASE in the last 168 hours. Recent Labs  Lab 11/10/19 1015 11/13/19 0856  AMMONIA 122* 38*   CBC: Recent Labs  Lab  11/10/19 1015 11/10/19 1213 11/11/19 0541 11/11/19 0541 11/11/19 [id number]  11/12/19 0348 11/13/19 0524 11/14/19 0510 11/15/19 0704  WBC 36.4*   < > 14.4*  --   --  9.6 9.4 6.7 11.7*  NEUTROABS 28.0*  --   --   --   --   --   --   --   --   HGB 12.9   < > 10.7*   < > 11.6* 10.4* 10.8* 11.6* 11.0*  HCT 46.9*   < > 35.6*   < > 34.0* 32.9* 34.8* 37.4 35.9*  MCV 98.5   < > 89.0  --   --  85.0 86.6 85.2 86.9  PLT 191   < > 126*  --   --  102* 103* 126* 144*   < > = values in this interval not displayed.   Cardiac Enzymes: No results for input(s): CKTOTAL, CKMB, CKMBINDEX, TROPONINI in the last 168 hours. BNP: Invalid input(s): POCBNP CBG: Recent Labs  Lab 11/14/19 2002 11/15/19 0045 11/15/19 0425 11/15/19 0734 11/15/19 1154  GLUCAP 125* 130* 124* 113* 101*   D-Dimer No results for input(s): DDIMER in the last 72 hours. Hgb A1c No results for input(s): HGBA1C in the last 72 hours. Lipid Profile No results for input(s): CHOL, HDL, LDLCALC, TRIG, CHOLHDL, LDLDIRECT in the last 72 hours. Thyroid function studies No results for input(s): TSH, T4TOTAL, T3FREE, THYROIDAB in the last 72 hours.  Invalid input(s): FREET3 Anemia work up No results for input(s): VITAMINB12, FOLATE, FERRITIN, TIBC, IRON, RETICCTPCT in the last 72 hours. Urinalysis    Component Value Date/Time   COLORURINE YELLOW 11/10/2019 1105   APPEARANCEUR CLEAR 11/10/2019 1105   LABSPEC 1.008 11/10/2019 1105   PHURINE 6.0 11/10/2019 1105   GLUCOSEU NEGATIVE 11/10/2019 1105   GLUCOSEU NEGATIVE 07/22/2016 1204   HGBUR MODERATE (A) 11/10/2019 1105   BILIRUBINUR NEGATIVE 11/10/2019 1105   KETONESUR NEGATIVE 11/10/2019 1105   PROTEINUR 100 (A) 11/10/2019 1105   UROBILINOGEN 0.2 07/22/2016 1204   NITRITE NEGATIVE 11/10/2019 1105   LEUKOCYTESUR NEGATIVE 11/10/2019 1105   Sepsis Labs Invalid input(s): PROCALCITONIN,  WBC,  LACTICIDVEN Microbiology Recent Results (from the past 240 hour(s))  Respiratory Panel by  RT PCR (Flu A&B, Covid) - Nasopharyngeal Swab     Status: None   Collection Time: 11/10/19 10:15 AM   Specimen: Nasopharyngeal Swab  Result Value Ref Range Status   SARS Coronavirus 2 by RT PCR NEGATIVE NEGATIVE Final    Comment: (NOTE) SARS-CoV-2 target nucleic acids are NOT DETECTED. The SARS-CoV-2 RNA is generally detectable in upper respiratoy specimens during the acute phase of infection. The lowest concentration of SARS-CoV-2 viral copies this assay can detect is 131 copies/mL. A negative result does not preclude SARS-Cov-2 infection and should not be used as the sole basis for treatment or other patient management decisions. A negative result may occur with  improper specimen collection/handling, submission of specimen other than nasopharyngeal swab, presence of viral mutation(s) within the areas targeted by this assay, and inadequate number of viral copies (<131 copies/mL). A negative result must be combined with clinical observations, patient history, and epidemiological information. The expected result is Negative. Fact Sheet for Patients:  PinkCheek.be Fact Sheet for Healthcare Providers:  GravelBags.it This test is not yet ap proved or cleared by the [place] FDA and  has been authorized for detection and/or diagnosis of SARS-CoV-2 by FDA under an Emergency Use Authorization (EUA). This EUA will remain  in effect (meaning this test can be used) for the duration of the COVID-19 declaration under Section  564(b)(1) of the Act, 21 U.S.C. section 360bbb-3(b)(1), unless the authorization is terminated or revoked sooner.    Influenza A by PCR NEGATIVE NEGATIVE Final   Influenza B by PCR NEGATIVE NEGATIVE Final    Comment: (NOTE) The Xpert Xpress SARS-CoV-2/FLU/RSV assay is intended as an aid in  the diagnosis of influenza from Nasopharyngeal swab specimens and  should not be used as a sole basis for treatment.  Nasal washings and  aspirates are unacceptable for Xpert Xpress SARS-CoV-2/FLU/RSV  testing. Fact Sheet for Patients: PinkCheek.be Fact Sheet for Healthcare Providers: GravelBags.it This test is not yet approved or cleared by the [place] FDA and  has been authorized for detection and/or diagnosis of SARS-CoV-2 by  FDA under an Emergency Use Authorization (EUA). This EUA will remain  in effect (meaning this test can be used) for the duration of the  Covid-19 declaration under Section 564(b)(1) of the Act, 21  U.S.C. section 360bbb-3(b)(1), unless the authorization is  terminated or revoked. Performed at [facility], [place] [address] [place], [place] [address]   Blood Culture (routine x 2)     Status: None   Collection Time: 11/10/19 10:15 AM   Specimen: BLOOD RIGHT HAND  Result Value Ref Range Status   Specimen Description BLOOD RIGHT HAND  Final   Special Requests   Final    BOTTLES DRAWN AEROBIC AND ANAEROBIC Blood Culture results may not be optimal due to an inadequate volume of blood received in culture bottles   Culture   Final    NO GROWTH 5 DAYS Performed at [facility], [place] [address], [place], [place] [address]    Report Status 11/15/2019 FINAL  Final  Blood Culture (routine x 2)     Status: None   Collection Time: 11/10/19 10:15 AM   Specimen: BLOOD  Result Value Ref Range Status   Specimen Description BLOOD LEFT ANTECUBITAL  Final   Special Requests   Final    BOTTLES DRAWN AEROBIC ONLY Blood Culture results may not be optimal due to an inadequate volume of blood received in culture bottles   Culture   Final    NO GROWTH 5 DAYS Performed at [facility], [place] [address] [place], [place] [address]    Report Status 11/15/2019 FINAL  Final  Urine culture     Status: None   Collection Time: 11/10/19 11:05 AM   Specimen: In/Out Cath Urine  Result Value Ref Range Status   Specimen  Description IN/OUT CATH URINE  Final   Special Requests NONE  Final   Culture   Final    NO GROWTH Performed at [facility], [place] [address] [place], [place] [address]    Report Status 11/11/2019 FINAL  Final  MRSA PCR Screening     Status: None   Collection Time: 11/10/19  1:31 PM   Specimen: Nasal Mucosa; Nasopharyngeal  Result Value Ref Range Status   MRSA by PCR NEGATIVE NEGATIVE Final    Comment:        The GeneXpert MRSA Assay (FDA approved for NASAL specimens only), is one component of a comprehensive MRSA colonization surveillance program. It is not intended to diagnose MRSA infection nor to guide or monitor treatment for MRSA infections. Performed at [facility], [place] [address], [place], [place] [address]   Culture, respiratory (tracheal aspirate)     Status: None   Collection Time: 11/11/19  8:38 AM   Specimen: Tracheal Aspirate; Respiratory  Result Value Ref Range Status   Specimen Description TRACHEAL ASPIRATE  Final   Special Requests NONE  Final   Gram Stain   Final    FEW WBC PRESENT,BOTH PMN AND MONONUCLEAR FEW GRAM POSITIVE COCCI IN PAIRS IN CLUSTERS Performed at [facility], [address], [place], [place] [address]    Culture FEW CANDIDA ALBICANS  Final   Report Status 11/14/2019 FINAL  Final     Time coordinating discharge:  I have spent 35 minutes face to face with the patient and on the ward discussing the patients care, assessment, plan and disposition with other care givers. >50% of the time was devoted counseling the patient about the risks and benefits of treatment/Discharge disposition and coordinating care.   SIGNED:   Damita Lack, MD  [facility] 11/15/2019, 1:07 PM   If 7PM-7AM, please contact night-coverage

## 2019-11-15 NOTE — Consult Note (Signed)
[facility] Face-to-Face Psychiatry Consult   Reason for Consult:  "Polypharmacy, med adjustment as necessary" Referring Physician:  Dr Reesa Chew Patient Identification: Amy Villa MRN:  [id number] Principal Diagnosis: Cardiac arrest [facility]) Diagnosis:  Principal Problem:   Cardiac arrest [facility]) Active Problems:   Anemia iron def   Liver cirrhosis (HCC)   Anxiety and depression   Substance abuse ([place])   Coagulopathy ([place])   Acute metabolic encephalopathy   Acute respiratory failure with hypoxemia (HCC)   Aspiration pneumonia (HCC)   AKI (acute kidney injury) ([place])   Lactic acidosis   Hyperkalemia   Hypothermia   Total Time spent with patient: 30 minutes  Subjective:   Amy Villa is a 54 y.o. female patient.  Patient assessed by nurse practitioner.  Patient alert and oriented, answers appropriately. Patient states "I have trouble getting to sleep at a good time, I want safe sleep, I think that may be what happened on Thursday night, I was trying to get some sleep." Patient denies suicidal ideations.  Patient reports 1 prior suicide attempt approximately 30 years ago.  Patient denies self-harm behaviors.  Patient denies homicidal ideations.  Patient denies auditory visual hallucinations.  Patient denies symptoms of paranoia. Patient reports she is currently treated by primary care for anxiety and depression.  Patient denies current outpatient psychiatrist.  Patient reports desire to follow-up with outpatient psychiatry and outpatient talk therapy. Patient reports she lives in [place] with her husband and son.  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  Patient denies alcohol and substance use, patient reports history of substance use.  Patient denies access to weapons. Patient gives verbal consent to speak with her husband, Amy Villa.  Phone number [phone] Spoke with patient's husband, Keileen Bride: Patient's husband denies concern for patient  safety.  Patient's husband states "I do not have concerns for her safety but I do have concerns about the medication she takes, have concerns that the doctor put her on Seroquel and gabapentin."  Patient's husband reports plan to assist patient to follow-up with outpatient psychiatry to address medication concerns.   HPI: Patient admitted after found with altered mental status by husband.  Past Psychiatric History: Anxiety, Depression, Substance Use Disorder  Risk to Self:  Denies Risk to Others:  Denies Prior Inpatient Therapy:  [facility]-2014 Prior Outpatient Therapy:  [facility]  Past Medical History:  Past Medical History:  Diagnosis Date  . Alcoholic hepatitis   . Aortic atherosclerosis ([place])   . Arthritis    neck  . Carpal tunnel syndrome on both sides 02/2012  . Cigarette nicotine dependence   . Contact lens/glasses fitting    wears contacts or glasses  . Dental crowns present    x 2 upper front  . Elevated LFTs   . Fatty liver   . Fluid retention   . History of MRSA infection   . Hypertension   . Insomnia   . Migraines   . Osteoarthritis   . Parasomnia   . Wears partial dentures    lower    Past Surgical History:  Procedure Laterality Date  . ANKLE ARTHROSCOPY WITH RECONSTRUCTION  07/2017  . BIOPSY  02/24/2018   Procedure: BIOPSY;  Surgeon: Jackquline Denmark, MD;  Location: [facility];  Service: Endoscopy;;  . CARPAL TUNNEL RELEASE  03/17/2012   Procedure: CARPAL TUNNEL RELEASE;  Surgeon: Roseanne Kaufman, MD;  Location: [place];  Service: Orthopedics;  Laterality: Bilateral;  left limited open  carpal tunnel release. right carpal tunnel injection with 1cc of celestone and 2cc of marcaine.25%  . CARPAL TUNNEL RELEASE  06/30/2012   Procedure: CARPAL TUNNEL RELEASE;  Surgeon: Roseanne Kaufman, MD;  Location: [place];  Service: Orthopedics;  Laterality: Right;  RIGHT LIMITED OPEN CARPAL TUNNEL RELEASE  . CARPOMETACARPAL (CMC) FUSION OF  THUMB Left 06/23/2013   Procedure: LEFT CARPOMETACARPEL ([place]) FUSION OF THUMB WITH AUTOGRAFT FROM RADIUS AND REPAIR AS NECESSARY;  Surgeon: Roseanne Kaufman, MD;  Location: [place];  Service: Orthopedics;  Laterality: Left;  . COLONOSCOPY  2014  . ESOPHAGOGASTRODUODENOSCOPY  05/2017  . ESOPHAGOGASTRODUODENOSCOPY (EGD) WITH PROPOFOL N/A 02/24/2018   Procedure: ESOPHAGOGASTRODUODENOSCOPY (EGD) WITH PROPOFOL;  Surgeon: Jackquline Denmark, MD;  [facility];  Service: Endoscopy;  Laterality: N/A;  . HARDWARE REMOVAL Left 07/10/2013   Procedure: HARDWARE REMOVAL;  Surgeon: Roseanne Kaufman, MD;  [facility];  Service: Orthopedics;  Laterality: Left;  . INCISION AND DRAINAGE OF WOUND Left 07/10/2013   Procedure: IRRIGATION AND DEBRIDEMENT WOUND left wrist  ;  Surgeon: Roseanne Kaufman, MD;  [facility];  Service: Orthopedics;  Laterality: Left;  . LAPAROSCOPIC VAGINAL HYSTERECTOMY  02/24/2010  . LAPAROSCOPY  06/2010   for adhesions  . LEEP     x 2  . LUMBAR DISC SURGERY  03/23/2003   left L5-S1 discectomy with microdissection  . LUMBAR DISC SURGERY  05/23/2003   redo discectomy L5-S1 with microdissection   Family History:  Family History  Problem Relation Age of Onset  . Diabetes Mother   . Hypertension Mother   . Colon polyps Sister   . Hypertension Sister   . Diabetes Sister   . Lung cancer Maternal Grandmother   . Colon cancer Neg Hx   . Esophageal cancer Neg Hx   . Pancreatic cancer Neg Hx   . Stomach cancer Neg Hx   . Liver disease Neg Hx    Family Psychiatric  History: Mother-major depressive disorder, suicide completion, sister-my depressive disorder Social History:  Social History   Substance and Sexual Activity  Alcohol Use Yes   Comment: 2 Mikes hard drinks; now has only occasional as of 11/18/16      Social History   Substance and Sexual Activity  Drug Use No    Social History   Socioeconomic History  . Marital status: Married    Spouse name:  Not on file  . Number of children: Not on file  . Years of education: Not on file  . Highest education level: Not on file  Occupational History  . Occupation: cleaning  Tobacco Use  . Smoking status: Former Research scientist (life sciences)  . Smokeless tobacco: Never Used  Substance and Sexual Activity  . Alcohol use: Yes    Comment: 2 Mikes hard drinks; now has only occasional as of 11/18/16   . Drug use: No  . Sexual activity: Not on file  Other Topics Concern  . Not on file  Social History Narrative   ** Merged History Encounter **       Patient is married does not have children She  works in her Research officer, trade union business Drinks alcohol regularly and smokes No current drug use reported   Social Determinants of Radio broadcast assistant Strain:   . Difficulty of Paying Living Expenses:   Food Insecurity:   . Worried About Charity fundraiser in the Last Year:   . Arboriculturist in the Last Year:   Transportation Needs:   .  Lack of Transportation (Medical):   [name] Lack of Transportation (Non-Medical):   Physical Activity:   . Days of Exercise per Week:   . Minutes of Exercise per Session:   Stress:   . Feeling of Stress :   Social Connections:   . Frequency of Communication with Friends and Family:   . Frequency of Social Gatherings with Friends and Family:   . Attends Religious Services:   . Active Member of Clubs or Organizations:   . Attends Archivist Meetings:   [name] Marital Status:    Additional Social History:    Allergies:  No Known Allergies  Labs:  Results for orders placed or performed during the hospital encounter of 11/10/19 (from the past 48 hour(s))  Glucose, capillary     Status: Abnormal   Collection Time: 11/13/19 11:17 AM  Result Value Ref Range   Glucose-Capillary 151 (H) 70 - 99 mg/dL    Comment: Glucose reference range applies only to samples taken after fasting for at least 8 hours.  Glucose, capillary     Status: Abnormal   Collection Time:  11/13/19  4:15 PM  Result Value Ref Range   Glucose-Capillary 163 (H) 70 - 99 mg/dL    Comment: Glucose reference range applies only to samples taken after fasting for at least 8 hours.  Glucose, capillary     Status: Abnormal   Collection Time: 11/13/19  7:57 PM  Result Value Ref Range   Glucose-Capillary 181 (H) 70 - 99 mg/dL    Comment: Glucose reference range applies only to samples taken after fasting for at least 8 hours.  Glucose, capillary     Status: Abnormal   Collection Time: 11/14/19 12:00 AM  Result Value Ref Range   Glucose-Capillary 183 (H) 70 - 99 mg/dL    Comment: Glucose reference range applies only to samples taken after fasting for at least 8 hours.  Glucose, capillary     Status: Abnormal   Collection Time: 11/14/19  4:00 AM  Result Value Ref Range   Glucose-Capillary 155 (H) 70 - 99 mg/dL    Comment: Glucose reference range applies only to samples taken after fasting for at least 8 hours.  CBC     Status: Abnormal   Collection Time: 11/14/19  5:10 AM  Result Value Ref Range   WBC 6.7 4.0 - 10.5 K/uL   RBC 4.39 3.87 - 5.11 MIL/uL   Hemoglobin 11.6 (L) 12.0 - 15.0 g/dL   HCT 37.4 36.0 - 46.0 %   MCV 85.2 80.0 - 100.0 fL   MCH 26.4 26.0 - 34.0 pg   MCHC 31.0 30.0 - 36.0 g/dL   RDW 22.0 (H) 11.5 - 15.5 %   Platelets 126 (L) 150 - 400 K/uL    Comment: CONSISTENT WITH PREVIOUS RESULT Immature Platelet Fraction may be clinically indicated, consider ordering this additional test [id number] REPEATED TO VERIFY    nRBC 0.0 0.0 - 0.2 %    Comment: Performed at [facility], [place] [address], [place], [place] [address]  Magnesium     Status: None   Collection Time: 11/14/19  5:10 AM  Result Value Ref Range   Magnesium 1.8 1.7 - 2.4 mg/dL    Comment: Performed at [facility] [address], [place], [place] [address]  Comprehensive metabolic panel     Status: Abnormal   Collection Time: 11/14/19  5:10 AM  Result Value Ref Range   Sodium 140 135 - 145  mmol/L   Potassium 4.6 3.5 - 5.1 mmol/L    Comment: NO VISIBLE HEMOLYSIS   Chloride 109 98 - 111 mmol/L   CO2 19 (L) 22 - 32 mmol/L   Glucose, Bld 163 (H) 70 - 99 mg/dL    Comment: Glucose reference range applies only to samples taken after fasting for at least 8 hours.   BUN 10 6 - 20 mg/dL   Creatinine, Ser 0.49 0.44 - 1.00 mg/dL   Calcium 9.3 8.9 - 10.3 mg/dL   Total Protein 7.0 6.5 - 8.1 g/dL   Albumin 3.4 (L) 3.5 - 5.0 g/dL   AST 45 (H) 15 - 41 U/L   ALT 108 (H) 0 - 44 U/L   Alkaline Phosphatase 149 (H) 38 - 126 U/L   Total Bilirubin 0.8 0.3 - 1.2 mg/dL   GFR calc non Af Amer >60 >60 mL/min   GFR calc Af Amer >60 >60 mL/min   Anion gap 12 5 - 15    Comment: Performed at [facility], [place] [address], [place], [place] [address]  Glucose, capillary     Status: Abnormal   Collection Time: 11/14/19  7:23 AM  Result Value Ref Range   Glucose-Capillary 213 (H) 70 - 99 mg/dL    Comment: Glucose reference range applies only to samples taken after fasting for at least 8 hours.  Protime-INR     Status: None   Collection Time: 11/14/19  9:23 AM  Result Value Ref Range   Prothrombin Time 14.1 11.4 - 15.2 seconds   INR 1.1 0.8 - 1.2    Comment: (NOTE) INR goal varies based on device and disease states. Performed at [facility], [place] [address], [place], [place] [address]   Glucose, capillary     Status: Abnormal   Collection Time: 11/14/19 11:04 AM  Result Value Ref Range   Glucose-Capillary 195 (H) 70 - 99 mg/dL    Comment: Glucose reference range applies only to samples taken after fasting for at least 8 hours.  Glucose, capillary     Status: Abnormal   Collection Time: 11/14/19  5:04 PM  Result Value Ref Range   Glucose-Capillary 109 (H) 70 - 99 mg/dL    Comment: Glucose reference range applies only to samples taken after fasting for at least 8 hours.  Glucose, capillary     Status: Abnormal   Collection Time: 11/14/19  8:02 PM  Result Value Ref Range    Glucose-Capillary 125 (H) 70 - 99 mg/dL    Comment: Glucose reference range applies only to samples taken after fasting for at least 8 hours.  Glucose, capillary     Status: Abnormal   Collection Time: 11/15/19 12:45 AM  Result Value Ref Range   Glucose-Capillary 130 (H) 70 - 99 mg/dL    Comment: Glucose reference range applies only to samples taken after fasting for at least 8 hours.  Glucose, capillary     Status: Abnormal   Collection Time: 11/15/19  4:25 AM  Result Value Ref Range   Glucose-Capillary 124 (H) 70 - 99 mg/dL    Comment: Glucose reference range applies only to samples taken after fasting for at least 8 hours.  CBC     Status: Abnormal   Collection Time: 11/15/19  7:04 AM  Result Value Ref Range   WBC 11.7 (H) 4.0 - 10.5 K/uL   RBC 4.13 3.87 - 5.11 MIL/uL   Hemoglobin 11.0 (L) 12.0 - 15.0 g/dL   HCT  35.9 (L) 36.0 - 46.0 %   MCV 86.9 80.0 - 100.0 fL   MCH 26.6 26.0 - 34.0 pg   MCHC 30.6 30.0 - 36.0 g/dL   RDW 22.5 (H) 11.5 - 15.5 %   Platelets 144 (L) 150 - 400 K/uL   nRBC 0.0 0.0 - 0.2 %    Comment: Performed at [facility] [address], [place], [place] [address]  Magnesium     Status: Abnormal   Collection Time: 11/15/19  7:04 AM  Result Value Ref Range   Magnesium 1.6 (L) 1.7 - 2.4 mg/dL    Comment: Performed at [facility] [address], [place], [place] [address]  Comprehensive metabolic panel     Status: Abnormal   Collection Time: 11/15/19  7:04 AM  Result Value Ref Range   Sodium 140 135 - 145 mmol/L   Potassium 3.9 3.5 - 5.1 mmol/L   Chloride 107 98 - 111 mmol/L   CO2 22 22 - 32 mmol/L   Glucose, Bld 120 (H) 70 - 99 mg/dL    Comment: Glucose reference range applies only to samples taken after fasting for at least 8 hours.   BUN 15 6 - 20 mg/dL   Creatinine, Ser 0.61 0.44 - 1.00 mg/dL   Calcium 8.9 8.9 - 10.3 mg/dL   Total Protein 6.3 (L) 6.5 - 8.1 g/dL   Albumin 3.2 (L) 3.5 - 5.0 g/dL   AST 31 15 - 41 U/L   ALT 75 (H) 0 - 44  U/L   Alkaline Phosphatase 112 38 - 126 U/L   Total Bilirubin 0.5 0.3 - 1.2 mg/dL   GFR calc non Af Amer >60 >60 mL/min   GFR calc Af Amer >60 >60 mL/min   Anion gap 11 5 - 15    Comment: Performed at [facility] [address] [place], [place] [address]  Glucose, capillary     Status: Abnormal   Collection Time: 11/15/19  7:34 AM  Result Value Ref Range   Glucose-Capillary 113 (H) 70 - 99 mg/dL    Comment: Glucose reference range applies only to samples taken after fasting for at least 8 hours.   Comment 1 Notify RN    Comment 2 Document in Chart     Current Facility-Administered Medications  Medication Dose Route Frequency Provider Last Rate Last Admin  . 0.9 %  sodium chloride infusion   Intravenous PRN Elgergawy, Silver Huguenin, MD 10 mL/hr at 11/14/19 0816 250 mL at 11/14/19 0816  . acetaminophen (TYLENOL) tablet 500 mg  500 mg Oral Q6H PRN Nita Sells, MD   500 mg at 11/14/19 1701  . Chlorhexidine Gluconate Cloth 2 % PADS 6 each  6 each Topical Daily Collene Gobble, MD   6 each at 11/14/19 [phone]  . chlorpheniramine-HYDROcodone (TUSSIONEX) 10-8 MG/5ML suspension 5 mL  5 mL Oral Q12H PRN Nita Sells, MD   5 mL at 11/14/19 1701  . feeding supplement (ENSURE ENLIVE) (ENSURE ENLIVE) liquid 237 mL  237 mL Oral TID BM Elgergawy, Silver Huguenin, MD   237 mL at 11/15/19 0952  . fluconazole (DIFLUCAN) tablet 200 mg  200 mg Oral q1800 Nita Sells, MD      . folic acid (FOLVITE) tablet 1 mg  1 mg Oral Daily Ina Homes, MD   1 mg at 11/15/19 0939  . guaiFENesin (MUCINEX) 12 hr tablet 1,200 mg  1,200 mg Oral BID Elgergawy, Silver Huguenin, MD   1,200  mg at 11/15/19 0939  . heparin injection 5,000 Units  5,000 Units Subcutaneous Q8H Erick Colace, NP   5,000 Units at 11/15/19 0550  . hydrALAZINE (APRESOLINE) injection 5 mg  5 mg Intravenous Q4H PRN Elgergawy, Silver Huguenin, MD   5 mg at 11/13/19 0745  . lactated ringers infusion   Intravenous Continuous Nita Sells, MD  50 mL/hr at 11/15/19 0400 Rate Verify at 11/15/19 0400  . lactulose (CHRONULAC) 10 GM/15ML solution 30 g  30 g Oral Daily Nita Sells, MD   30 g at 11/15/19 0939  . multivitamin with minerals tablet 1 tablet  1 tablet Oral Daily Ina Homes, MD   1 tablet at 11/15/19 0939  . pantoprazole (PROTONIX) EC tablet 40 mg  40 mg Oral QHS Collene Gobble, MD   40 mg at 11/14/19 2027  . propranolol (INDERAL) tablet 20 mg  20 mg Oral BID Elgergawy, Silver Huguenin, MD   20 mg at 11/15/19 0940  . [START ON 11/16/2019] thiamine tablet 100 mg  100 mg Oral Daily Amin, Jeanella Flattery, MD        Musculoskeletal: Strength & Muscle Tone: within normal limits Gait & Station: normal Patient leans: N/A  Psychiatric Specialty Exam: Physical Exam  Nursing note and vitals reviewed. Constitutional: She is oriented to person, place, and time. She appears well-developed.  HENT:  Head: Normocephalic.  Cardiovascular: Normal rate.  Respiratory: Effort normal.  Musculoskeletal:        General: Normal range of motion.     Cervical back: Normal range of motion.  Neurological: She is alert and oriented to person, place, and time.  Psychiatric: She has a normal mood and affect. Her speech is normal and behavior is normal. Judgment and thought content normal. Cognition and memory are normal.    Review of Systems  Constitutional: Negative.   HENT: Negative.   Eyes: Negative.   Respiratory: Negative.   Cardiovascular: Negative.   Gastrointestinal: Negative.   Genitourinary: Negative.   Musculoskeletal: Negative.   Skin: Negative.   Neurological: Negative.   Psychiatric/Behavioral: Positive for sleep disturbance.    Blood pressure (!) 158/83, pulse 70, temperature 99.2 F (37.3 C), temperature source Oral, resp. rate 16, height 5\' 3"  (1.6 m), weight 63.7 kg, SpO2 100 %.Body mass index is 24.87 kg/m.  General Appearance: Casual and Fairly Groomed  Eye Contact:  Good  Speech:  Clear and Coherent and Normal  Rate  Volume:  Normal  Mood:  Anxious  Affect:  Appropriate and Congruent  Thought Process:  Coherent, Goal Directed and Descriptions of Associations: Intact  Orientation:  Full (Time, Place, and Person)  Thought Content:  WDL and Logical  Suicidal Thoughts:  No  Homicidal Thoughts:  No  Memory:  Immediate;   Good Recent;   Good Remote;   Good  Judgement:  Good  Insight:  Good  Psychomotor Activity:  Normal  Concentration:  Concentration: Good and Attention Span: Good  Recall:  Good  Fund of Knowledge:  Good  Language:  Good  Akathisia:  No  Handed:  Right  AIMS (if indicated):     Assets:  Communication Skills Desire for Improvement Financial Resources/Insurance Housing Intimacy Leisure Time [place] Talents/Skills Transportation  ADL's:  Intact  Cognition:  WNL  Sleep:        Treatment Plan Summary: Patient discussed with Dr. Dwyane Dee.  Recommend patient follow-up with outpatient primary care or psychiatry to address medication updates. Patient does not appear to meet  inpatient crisis criteria at this time.  Disposition: No evidence of imminent risk to self or others at present.   Patient does not meet criteria for psychiatric inpatient admission. Supportive therapy provided about ongoing stressors.  Emmaline Kluver, FNP 11/15/2019 10:36 AM

## 2019-11-15 NOTE — Progress Notes (Signed)
PROGRESS NOTE    Amy Villa  [id number] DOB: 09-Nov-1965 DOA: 11/10/2019 PCP: Patient, No Pcp Per   Brief Narrative:  54 year old with history of tobacco use, anxiety/depression, insomnia, alcohol cirrhosis with esophageal varices, COPD, GERD, HTN, iron deficiency anemia was in routine health until 4/22.  Following day her husband checked up on her and apparently patient might have overmedicated herself and was found to be pulseless and apneic.  CPR was initiated, EMS arrived and noted she was in asystole for about 30 minutes there after achieving ROSC.  She was initially admitted to the ICU, slowly mentation improved and she also self extubated on 4/24.   Assessment & Plan:   Principal Problem:   Cardiac arrest [facility]) Active Problems:   Anemia iron def   Liver cirrhosis (HCC)   Anxiety and depression   Substance abuse (HCC)   Coagulopathy (HCC)   Acute metabolic encephalopathy   Acute respiratory failure with hypoxemia (HCC)   Aspiration pneumonia (HCC)   AKI (acute kidney injury) (HCC)   Lactic acidosis   Hyperkalemia   Hypothermia   Cardiopulmonary arrest, asystole initial rhythm.  Suspect secondary to drug overdose -Polypharmacy-recently started on Seroquel.  Also she is on Neurontin, Prozac, BuSpar, Flexeril -Echo shows EF 50-55% -Continue to monitor on telemetry.  No significant events on telemetry  Acute hypoxic respiratory failure following cardiopulmonary arrest Aspiration pneumonia likely during encephalopathy Right lower rib fracture from CPR -She was successfully extubated 4/24, currently on nasal cannula. -Continue with IV Unasyn.  Now on Augmentin-last day 5/2 -Aspiration precaution -Speech evaluation-regular diet  Acute metabolic encephalopathy -Improved mentating well this morning.  History of alcohol related liver cirrhosis without ascites but with history of esophageal varices Mild coagulopathy  -NR level mildly elevated at  1.1 -Continue lactulose titrate about 3 bowel movements daily - elevated LFTs most likely related cardiac arrest, will continue to trend -Patient had endoscopy, and liver ultrasound done in the past showing some evidence of cirrhosis and low-grade varices. -Propranolol resumed -Continue with lactulose.  Hxanxiety and depression - Continue BuSpar and Prozac, hold Flexeril and Neurontin. Will consult psych to ensure proper med adjustment and outpatient follow up.   History of gastroesophageal reflux disease -PPI  History of iron deficiency anemia -Trend cbc  Hypokalemia -Repletred     DVT prophylaxis: Subcutaneous heparin Code Status: DNR Family Communication: None Disposition Plan:   Patient From= home  Patient Anticipated D/C place= CIR  Barriers= still having URI symptoms with slight exertional dyspnea.  Coughing up quite a bit of mucus and sputum.  Also awaiting psychiatry to see the patient.  Hopefully discharge tomorrow    Subjective: Having some exertional dyspnea and productive coughing.  Reports of some chest discomfort with coughing  Review of Systems Otherwise negative except as per HPI, including: General: Denies fever, chills, night sweats or unintended weight loss. Resp: Denies cough, wheezing, shortness of breath. Cardiac: Denies chest pain, palpitations, orthopnea, paroxysmal nocturnal dyspnea. GI: Denies abdominal pain, nausea, vomiting, diarrhea or constipation GU: Denies dysuria, frequency, hesitancy or incontinence MS: Denies muscle aches, joint pain or swelling Neuro: Denies headache, neurologic deficits (focal weakness, numbness, tingling), abnormal gait Psych: Denies anxiety, depression, SI/HI/AVH Skin: Denies new rashes or lesions ID: Denies sick contacts, exotic exposures, travel  Examination:  General exam: Appears calm and comfortable  Respiratory system: Bilateral rhonchi especially at the bases Cardiovascular system: S1 & S2  heard, RRR. No JVD, murmurs, rubs, gallops or clicks. No pedal edema. Gastrointestinal system: Abdomen is nondistended,  soft and nontender. No organomegaly or masses felt. Normal bowel sounds heard. Central nervous system: Alert and oriented. No focal neurological deficits. Extremities: Symmetric 5 x 5 power. Skin: No rashes, lesions or ulcers Psychiatry: Judgement and insight appear normal. Mood & affect appropriate.     Objective: Vitals:   11/14/19 2021 11/15/19 0046 11/15/19 0426 11/15/19 0724  BP: (!) 143/94 (!) 165/87 (!) 170/93 (!) 166/89  Pulse: 67 68 66 67  Resp: 19 19 19 20   Temp: 98.6 F (37 C) 97.8 F (36.6 C) 98.5 F (36.9 C) 98.4 F (36.9 C)  TempSrc: Oral Oral Oral Oral  SpO2: 97% 98% 95% 98%  Weight:   63.7 kg   Height:        Intake/Output Summary (Last 24 hours) at 11/15/2019 0745 Last data filed at 11/15/2019 0433 Gross per 24 hour  Intake 2162.55 ml  Output 600 ml  Net 1562.55 ml   Filed Weights   11/13/19 0002 11/14/19 0100 11/15/19 0426  Weight: 62.5 kg 62.1 kg 63.7 kg     Data Reviewed:   CBC: Recent Labs  Lab 11/10/19 1015 11/10/19 1213 11/10/19 1321 11/10/19 1611 11/11/19 0541 11/11/19 0607 11/12/19 0348 11/13/19 0524 11/14/19 0510  WBC 36.4*   < > 27.8*  --  14.4*  --  9.6 9.4 6.7  NEUTROABS 28.0*  --   --   --   --   --   --   --   --   HGB 12.9   < > 13.7   < > 10.7* 11.6* 10.4* 10.8* 11.6*  HCT 46.9*   < > 46.9*   < > 35.6* 34.0* 32.9* 34.8* 37.4  MCV 98.5   < > 91.8  --  89.0  --  85.0 86.6 85.2  PLT 191   < > 154  --  126*  --  102* 103* 126*   < > = values in this interval not displayed.   Basic Metabolic Panel: Recent Labs  Lab 11/11/19 0541 11/11/19 0607 11/11/19 1654 11/12/19 0535 11/12/19 1910 11/13/19 0524 11/14/19 0510  NA 138   < > 143 141 138 141 140  K 3.9   < > 3.4* 2.9* 3.6 3.4* 4.6  CL 108   < > 110 103 101 106 109  CO2 22   < > 24 27 24 22  19*  GLUCOSE 133*   < > 99 118* 102* 124* 163*  BUN 12   < >  8 <5* <5* 5* 10  CREATININE 0.83   < > 0.49 0.57 0.57 0.59 0.49  CALCIUM 8.3*   < > 8.5* 8.7* 8.9 8.7* 9.3  MG 2.0  --   --  1.7  --   --  1.8  PHOS 3.1  --   --   --   --   --   --    < > = values in this interval not displayed.   GFR: Estimated Creatinine Clearance: 73.1 mL/min (by C-G formula based on SCr of 0.49 mg/dL). Liver Function Tests: Recent Labs  Lab 11/10/19 1015 11/11/19 0716 11/13/19 0524 11/14/19 0510  AST 318* 341* 89* 45*  ALT 252* 294* 154* 108*  ALKPHOS 173* 105 153* 149*  BILITOT 0.7 0.7 1.7* 0.8  PROT 6.8 6.0* 6.7 7.0  ALBUMIN 3.7 3.1* 3.4* 3.4*   No results for input(s): LIPASE, AMYLASE in the last 168 hours. Recent Labs  Lab 11/10/19 1015 11/13/19 0856  AMMONIA 122* 38*   Coagulation  Profile: Recent Labs  Lab 11/10/19 1015 11/11/19 0716 11/14/19 0923  INR 1.7* 1.3* 1.1   Cardiac Enzymes: No results for input(s): CKTOTAL, CKMB, CKMBINDEX, TROPONINI in the last 168 hours. BNP (last 3 results) No results for input(s): PROBNP in the last 8760 hours. HbA1C: No results for input(s): HGBA1C in the last 72 hours. CBG: Recent Labs  Lab 11/14/19 1704 11/14/19 2002 11/15/19 0045 11/15/19 0425 11/15/19 0734  GLUCAP 109* 125* 130* 124* 113*   Lipid Profile: No results for input(s): CHOL, HDL, LDLCALC, TRIG, CHOLHDL, LDLDIRECT in the last 72 hours. Thyroid Function Tests: No results for input(s): TSH, T4TOTAL, FREET4, T3FREE, THYROIDAB in the last 72 hours. Anemia Panel: No results for input(s): VITAMINB12, FOLATE, FERRITIN, TIBC, IRON, RETICCTPCT in the last 72 hours. Sepsis Labs: Recent Labs  Lab 11/10/19 1015 11/10/19 1321  LATICACIDVEN 9.3* 3.8*    Recent Results (from the past 240 hour(s))  Respiratory Panel by RT PCR (Flu A&B, Covid) - Nasopharyngeal Swab     Status: None   Collection Time: 11/10/19 10:15 AM   Specimen: Nasopharyngeal Swab  Result Value Ref Range Status   SARS Coronavirus 2 by RT PCR NEGATIVE NEGATIVE Final     Comment: (NOTE) SARS-CoV-2 target nucleic acids are NOT DETECTED. The SARS-CoV-2 RNA is generally detectable in upper respiratoy specimens during the acute phase of infection. The lowest concentration of SARS-CoV-2 viral copies this assay can detect is 131 copies/mL. A negative result does not preclude SARS-Cov-2 infection and should not be used as the sole basis for treatment or other patient management decisions. A negative result may occur with  improper specimen collection/handling, submission of specimen other than nasopharyngeal swab, presence of viral mutation(s) within the areas targeted by this assay, and inadequate number of viral copies (<131 copies/mL). A negative result must be combined with clinical observations, patient history, and epidemiological information. The expected result is Negative. Fact Sheet for Patients:  PinkCheek.be Fact Sheet for Healthcare Providers:  GravelBags.it This test is not yet ap proved or cleared by the [place] FDA and  has been authorized for detection and/or diagnosis of SARS-CoV-2 by FDA under an Emergency Use Authorization (EUA). This EUA will remain  in effect (meaning this test can be used) for the duration of the COVID-19 declaration under Section 564(b)(1) of the Act, 21 U.S.C. section 360bbb-3(b)(1), unless the authorization is terminated or revoked sooner.    Influenza A by PCR NEGATIVE NEGATIVE Final   Influenza B by PCR NEGATIVE NEGATIVE Final    Comment: (NOTE) The Xpert Xpress SARS-CoV-2/FLU/RSV assay is intended as an aid in  the diagnosis of influenza from Nasopharyngeal swab specimens and  should not be used as a sole basis for treatment. Nasal washings and  aspirates are unacceptable for Xpert Xpress SARS-CoV-2/FLU/RSV  testing. Fact Sheet for Patients: PinkCheek.be Fact Sheet for Healthcare  Providers: GravelBags.it This test is not yet approved or cleared by the [place] FDA and  has been authorized for detection and/or diagnosis of SARS-CoV-2 by  FDA under an Emergency Use Authorization (EUA). This EUA will remain  in effect (meaning this test can be used) for the duration of the  Covid-19 declaration under Section 564(b)(1) of the Act, 21  U.S.C. section 360bbb-3(b)(1), unless the authorization is  terminated or revoked. Performed at [facility], [place] [address] [place], [place] [address]   Blood Culture (routine x 2)     Status: None (Preliminary result)   Collection Time: 11/10/19 10:15 AM  Specimen: BLOOD RIGHT HAND  Result Value Ref Range Status   Specimen Description BLOOD RIGHT HAND  Final   Special Requests   Final    BOTTLES DRAWN AEROBIC AND ANAEROBIC Blood Culture results may not be optimal due to an inadequate volume of blood received in culture bottles   Culture   Final    NO GROWTH 4 DAYS Performed at [facility], [place] [address], [place], [place] [address]    Report Status PENDING  Incomplete  Blood Culture (routine x 2)     Status: None (Preliminary result)   Collection Time: 11/10/19 10:15 AM   Specimen: BLOOD  Result Value Ref Range Status   Specimen Description BLOOD LEFT ANTECUBITAL  Final   Special Requests   Final    BOTTLES DRAWN AEROBIC ONLY Blood Culture results may not be optimal due to an inadequate volume of blood received in culture bottles   Culture   Final    NO GROWTH 4 DAYS Performed at [facility], [place] [address], [place], [place] [address]    Report Status PENDING  Incomplete  Urine culture     Status: None   Collection Time: 11/10/19 11:05 AM   Specimen: In/Out Cath Urine  Result Value Ref Range Status   Specimen Description IN/OUT CATH URINE  Final   Special Requests NONE  Final   Culture   Final    NO GROWTH Performed at [facility], [place] [address]  [place], [place] [address]    Report Status 11/11/2019 FINAL  Final  MRSA PCR Screening     Status: None   Collection Time: 11/10/19  1:31 PM   Specimen: Nasal Mucosa; Nasopharyngeal  Result Value Ref Range Status   MRSA by PCR NEGATIVE NEGATIVE Final    Comment:        The GeneXpert MRSA Assay (FDA approved for NASAL specimens only), is one component of a comprehensive MRSA colonization surveillance program. It is not intended to diagnose MRSA infection nor to guide or monitor treatment for MRSA infections. Performed at [facility], [place] [address], [place], [place] [address]   Culture, respiratory (tracheal aspirate)     Status: None   Collection Time: 11/11/19  8:38 AM   Specimen: Tracheal Aspirate; Respiratory  Result Value Ref Range Status   Specimen Description TRACHEAL ASPIRATE  Final   Special Requests NONE  Final   Gram Stain   Final    FEW WBC PRESENT,BOTH PMN AND MONONUCLEAR FEW GRAM POSITIVE COCCI IN PAIRS IN CLUSTERS Performed at [facility], [address], [place],  [address]    Culture FEW CANDIDA ALBICANS  Final   Report Status 11/14/2019 FINAL  Final         Radiology Studies: ECHOCARDIOGRAM COMPLETE  Result Date: 11/13/2019    ECHOCARDIOGRAM REPORT   Patient Name:   Amy Villa Date of Exam: 11/13/2019 Medical Rec #:  [id number]          Height:       63.0 in Accession #:    [id number]         Weight:       137.8 lb Date of Birth:  Nov 19, 1965          BSA:          1.651 m Patient Age:    40 years           BP:  171/108 mmHg Patient Gender: F                  HR:           93 bpm. Exam Location:  Inpatient Procedure: 2D Echo and Intracardiac Opacification Agent Indications:    Cardiac arrest I46.9  History:        Patient has no prior history of Echocardiogram examinations.                 COPD; Risk Factors:Former Smoker. Alcoholic related cirrhosis of                 the liver, Acute metabolic encephalopathy, Acute respiratory                  failure with hypoxemia.  Sonographer:    Darlina Sicilian RDCS Referring Phys: [place]  Sonographer Comments: Technically challenging study due to limited acoustic windows and suboptimal apical window. IMPRESSIONS  1. Left ventricular ejection fraction, by estimation, is 50 to 55%. The left ventricle has low normal function. The left ventricle has no regional wall motion abnormalities. Left ventricular diastolic parameters are indeterminate.  2. Right ventricular systolic function is normal. The right ventricular size is normal. Tricuspid regurgitation signal is inadequate for assessing PA pressure.  3. The mitral valve is normal in structure. No evidence of mitral valve regurgitation. No evidence of mitral stenosis.  4. The aortic valve is tricuspid. Aortic valve regurgitation is not visualized. Unable to assess for aortic valve stenosis.  5. The inferior vena cava is normal in size with greater than 50% respiratory variability, suggesting right atrial pressure of 3 mmHg. FINDINGS  Left Ventricle: Left ventricular ejection fraction, by estimation, is 50 to 55%. The left ventricle has low normal function. The left ventricle has no regional wall motion abnormalities. Definity contrast agent was given IV to delineate the left ventricular endocardial borders. The left ventricular internal cavity size was normal in size. There is no left ventricular hypertrophy. Left ventricular diastolic parameters are indeterminate. Right Ventricle: The right ventricular size is normal. No increase in right ventricular wall thickness. Right ventricular systolic function is normal. Tricuspid regurgitation signal is inadequate for assessing PA pressure. Left Atrium: Left atrial size was not well visualized. Right Atrium: Right atrial size was not well visualized. Pericardium: There is no evidence of pericardial effusion. Mitral Valve: The mitral valve is normal in structure. Normal mobility of the mitral valve  leaflets. No evidence of mitral valve regurgitation. No evidence of mitral valve stenosis. Tricuspid Valve: The tricuspid valve is normal in structure. Tricuspid valve regurgitation is trivial. No evidence of tricuspid stenosis. Aortic Valve: The aortic valve is tricuspid. Aortic valve regurgitation is not visualized. Unable to assess for aortic valve stenosis. Pulmonic Valve: The pulmonic valve was normal in structure. Pulmonic valve regurgitation is not visualized. No evidence of pulmonic stenosis. Aorta: The aortic root is normal in size and structure. Venous: The inferior vena cava is normal in size with greater than 50% respiratory variability, suggesting right atrial pressure of 3 mmHg. IAS/Shunts: No atrial level shunt detected by color flow Doppler.  LEFT VENTRICLE PLAX 2D LVIDd:         4.50 cm LVIDs:         3.55 cm LV PW:         0.80 cm LV IVS:        0.97 cm LVOT diam:     1.80 cm LVOT Area:  2.54 cm  LV Volumes (MOD) LV vol d, MOD A2C: 134.0 ml LV vol d, MOD A4C: 133.0 ml LV vol s, MOD A2C: 49.5 ml LV vol s, MOD A4C: 52.6 ml LV SV MOD A2C:     84.5 ml LV SV MOD A4C:     133.0 ml LV SV MOD BP:      83.7 ml RIGHT VENTRICLE RV S prime:     14.40 cm/s TAPSE (M-mode): 2.3 cm LEFT ATRIUM             Index LA diam:        2.80 cm 1.70 cm/m LA Vol (A2C):   32.4 ml 19.63 ml/m LA Vol (A4C):   20.4 ml 12.36 ml/m LA Biplane Vol: 27.1 ml 16.42 ml/m   AORTA Ao Root diam: 3.10 cm MITRAL VALVE MV Area (PHT): 8.92 cm    SHUNTS MV Decel Time: 85 msec     Systemic Diam: 1.80 cm MV E velocity: 70.70 cm/s MV A velocity: 94.70 cm/s MV E/A ratio:  0.75 Cherlynn Kaiser MD Electronically signed by Cherlynn Kaiser MD Signature Date/Time: 11/13/2019/10:19:31 AM    Final         Scheduled Meds: . Chlorhexidine Gluconate Cloth  6 each Topical Daily  . feeding supplement (ENSURE ENLIVE)  237 mL Oral TID BM  . fluconazole  200 mg Oral q1800  . folic acid  1 mg Oral Daily  . guaiFENesin  1,200 mg Oral BID  .  heparin  5,000 Units Subcutaneous Q8H  . lactulose  30 g Oral Daily  . multivitamin with minerals  1 tablet Oral Daily  . pantoprazole  40 mg Oral QHS  . propranolol  20 mg Oral BID   Continuous Infusions: . sodium chloride 250 mL (11/14/19 0816)  . lactated ringers 50 mL/hr at 11/15/19 0400  . thiamine injection Stopped (11/14/19 2104)     LOS: 5 days   Time spent= 25 mins    Gaylia Kassel Arsenio Loader, MD [facility]  If 7PM-7AM, please contact night-coverage  11/15/2019, 7:45 AM

## 2019-11-15 NOTE — Progress Notes (Signed)
Went over discharge instructions with the patient. Patient verbalizes understanding that she would need to call to make follow up appointments with her PCP and with psychiatry. Patient has spoken to [place] and med has been given. IV's have been removed.

## 2021-04-15 ENCOUNTER — Other Ambulatory Visit: Payer: Self-pay | Admitting: Physician Assistant

## 2021-04-15 DIAGNOSIS — Z1231 Encounter for screening mammogram for malignant neoplasm of breast: Secondary | ICD-10-CM

## 2021-05-01 ENCOUNTER — Encounter (HOSPITAL_BASED_OUTPATIENT_CLINIC_OR_DEPARTMENT_OTHER): Payer: Self-pay

## 2021-05-01 ENCOUNTER — Emergency Department (HOSPITAL_BASED_OUTPATIENT_CLINIC_OR_DEPARTMENT_OTHER): Payer: BLUE CROSS/BLUE SHIELD | Admitting: Radiology

## 2021-05-01 ENCOUNTER — Emergency Department (HOSPITAL_BASED_OUTPATIENT_CLINIC_OR_DEPARTMENT_OTHER)
Admission: EM | Admit: 2021-05-01 | Discharge: 2021-05-01 | Disposition: A | Discharge status: Home / Self Care | Payer: BLUE CROSS/BLUE SHIELD | Attending: Emergency Medicine | Admitting: Emergency Medicine

## 2021-05-01 ENCOUNTER — Other Ambulatory Visit: Payer: Self-pay

## 2021-05-01 DIAGNOSIS — D72829 Elevated white blood cell count, unspecified: Secondary | ICD-10-CM | POA: Insufficient documentation

## 2021-05-01 DIAGNOSIS — I10 Essential (primary) hypertension: Secondary | ICD-10-CM | POA: Diagnosis not present

## 2021-05-01 DIAGNOSIS — Z79899 Other long term (current) drug therapy: Secondary | ICD-10-CM | POA: Insufficient documentation

## 2021-05-01 DIAGNOSIS — Z87891 Personal history of nicotine dependence: Secondary | ICD-10-CM | POA: Diagnosis not present

## 2021-05-01 DIAGNOSIS — R0602 Shortness of breath: Secondary | ICD-10-CM | POA: Insufficient documentation

## 2021-05-01 LAB — CBC WITH DIFFERENTIAL/PLATELET
Abs Immature Granulocytes: 0.06 10*3/uL (ref 0.00–0.07)
Basophils Absolute: 0 10*3/uL (ref 0.0–0.1)
Basophils Relative: 0 %
Eosinophils Absolute: 0 10*3/uL (ref 0.0–0.5)
Eosinophils Relative: 0 %
HCT: 42.6 % (ref 36.0–46.0)
Hemoglobin: 14.1 g/dL (ref 12.0–15.0)
Immature Granulocytes: 0 %
Lymphocytes Relative: 7 %
Lymphs Abs: 1.2 10*3/uL (ref 0.7–4.0)
MCH: 30.1 pg (ref 26.0–34.0)
MCHC: 33.1 g/dL (ref 30.0–36.0)
MCV: 91 fL (ref 80.0–100.0)
Monocytes Absolute: 0.7 10*3/uL (ref 0.1–1.0)
Monocytes Relative: 4 %
Neutro Abs: 15.4 10*3/uL — ABNORMAL HIGH (ref 1.7–7.7)
Neutrophils Relative %: 89 %
Platelets: 134 10*3/uL — ABNORMAL LOW (ref 150–400)
RBC: 4.68 MIL/uL (ref 3.87–5.11)
RDW: 15.3 % (ref 11.5–15.5)
WBC: 17.5 10*3/uL — ABNORMAL HIGH (ref 4.0–10.5)
nRBC: 0 % (ref 0.0–0.2)

## 2021-05-01 LAB — BASIC METABOLIC PANEL
Anion gap: 14 (ref 5–15)
BUN: 5 mg/dL — ABNORMAL LOW (ref 6–20)
CO2: 26 mmol/L (ref 22–32)
Calcium: 8.8 mg/dL — ABNORMAL LOW (ref 8.9–10.3)
Chloride: 98 mmol/L (ref 98–111)
Creatinine, Ser: 0.62 mg/dL (ref 0.44–1.00)
GFR, Estimated: >60 mL/min (ref >60)
Glucose, Bld: 122 mg/dL — ABNORMAL HIGH (ref 70–99)
Potassium: 3.7 mmol/L (ref 3.5–5.1)
Sodium: 138 mmol/L (ref 135–145)

## 2021-05-01 LAB — TROPONIN I (HIGH SENSITIVITY): Troponin I (High Sensitivity): 6 ng/L (ref <18)

## 2021-05-01 MED ORDER — AEROCHAMBER PLUS FLO-VU LARGE MISC
1.0000 | Freq: Once | Status: AC
  Administered 2021-05-01: 1
  Filled 2021-05-01: qty 1

## 2021-05-01 MED ORDER — ALBUTEROL SULFATE HFA 108 (90 BASE) MCG/ACT IN AERS
2.0000 | INHALATION_SPRAY | Freq: Once | RESPIRATORY_TRACT | Status: AC
  Administered 2021-05-01: 2 via RESPIRATORY_TRACT
  Filled 2021-05-01: qty 6.7

## 2021-05-01 NOTE — ED Provider Notes (Signed)
[facility] EMERGENCY DEPT Provider Note   CSN: [id number] Arrival date & time: 05/01/21  1421     History Chief Complaint  Patient presents with   Anxiety   Shortness of Breath    Amy Villa is a 55 y.o. female.  Presents to ER with concern for episode of shortness of breath.  Patient states that this morning when she woke up she felt extremely anxious and short of breath.  Felt like her anxiety caused her breathing to get worse.  Eventually she was able to calm herself down and feels like her breathing improved.  Reports that she has been told she has COPD previously.  Still feels like she is having some slight wheezing slightly short of breath but symptoms markedly improved from earlier.  No new cough.  No fever or chills.  HPI     Past Medical History:  Diagnosis Date   Alcoholic hepatitis    Aortic atherosclerosis ([place])    Arthritis    neck   Carpal tunnel syndrome on both sides 02/2012   Cigarette nicotine dependence    Contact lens/glasses fitting    wears contacts or glasses   Dental crowns present    x 2 upper front   Elevated LFTs    Fatty liver    Fluid retention    History of MRSA infection    Hypertension    Insomnia    Migraines    Osteoarthritis    Parasomnia    Wears partial dentures    lower    Patient Active Problem List   Diagnosis Date Noted   Cardiac arrest ([place]) 11/10/2019   Coagulopathy ([place]) 56/31/4970   Acute metabolic encephalopathy 26/37/8588   Acute respiratory failure with hypoxemia ([place]) 11/10/2019   Aspiration pneumonia ([place]) 11/10/2019   AKI (acute kidney injury) ([place]) 11/10/2019   Lactic acidosis 11/10/2019   Hyperkalemia 11/10/2019   Hypothermia 11/10/2019   AMS (altered mental status) 09/16/2019   GI bleed 02/23/2018   Substance abuse ([place]) 03/13/2017   Aortic atherosclerosis ([place]) 12/01/2016   Hepatic steatosis 12/01/2016   Insomnia 12/01/2016   Anxiety and depression 12/01/2016   Liver cirrhosis  ([place]) 11/16/2016   Nicotine dependence 07/11/2013   DJD (degenerative joint disease) 07/11/2013   DDD (degenerative disc disease) 07/11/2013   Anemia iron def 07/11/2013   Narcotic abuse ([place]) 07/11/2013   CMC arthritis, thumb, degenerative 06/23/2013   Alcohol abuse 09/02/2010   Benign essential hypertension 06/09/2005    Past Surgical History:  Procedure Laterality Date   ANKLE ARTHROSCOPY WITH RECONSTRUCTION  07/2017   BIOPSY  02/24/2018   Procedure: BIOPSY;  Surgeon: Jackquline Denmark, MD;  Location: [facility];  Service: Endoscopy;;   CARPAL TUNNEL RELEASE  03/17/2012   Procedure: CARPAL TUNNEL RELEASE;  Surgeon: Roseanne Kaufman, MD;  Location: [facility];  Service: Orthopedics;  Laterality: Bilateral;  left limited open carpal tunnel release. right carpal tunnel injection with 1cc of celestone and 2cc of marcaine.25%   CARPAL TUNNEL RELEASE  06/30/2012   Procedure: CARPAL TUNNEL RELEASE;  Surgeon: Roseanne Kaufman, MD;  Location: [facility];  Service: Orthopedics;  Laterality: Right;  RIGHT LIMITED OPEN CARPAL TUNNEL RELEASE   CARPOMETACARPAL ([facility]) FUSION OF THUMB Left 06/23/2013   Procedure: LEFT CARPOMETACARPEL ([place]) FUSION OF THUMB WITH AUTOGRAFT FROM RADIUS AND REPAIR AS NECESSARY;  Surgeon: Roseanne Kaufman, MD;  Location: [facility];  Service: Orthopedics;  Laterality: Left;   COLONOSCOPY  2014   ESOPHAGOGASTRODUODENOSCOPY  05/2017  ESOPHAGOGASTRODUODENOSCOPY (EGD) WITH PROPOFOL N/A 02/24/2018   Procedure: ESOPHAGOGASTRODUODENOSCOPY (EGD) WITH PROPOFOL;  Surgeon: Jackquline Denmark, MD;  [facility]: [facility];  Service: Endoscopy;  Laterality: N/A;   HARDWARE REMOVAL Left 07/10/2013   Procedure: HARDWARE REMOVAL;  Surgeon: Roseanne Kaufman, MD;  Location: [facility];  Service: Orthopedics;  Laterality: Left;   INCISION AND DRAINAGE OF WOUND Left 07/10/2013   Procedure: IRRIGATION AND DEBRIDEMENT WOUND left wrist  ;  Surgeon: Roseanne Kaufman, MD;  Location: [facility];   Service: Orthopedics;  Laterality: Left;   LAPAROSCOPIC VAGINAL HYSTERECTOMY  02/24/2010   LAPAROSCOPY  06/2010   for adhesions   LEEP     x 2   LUMBAR DISC SURGERY  03/23/2003   left L5-S1 discectomy with microdissection   LUMBAR DISC SURGERY  05/23/2003   redo discectomy L5-S1 with microdissection     OB History   No obstetric history on file.     Family History  Problem Relation Age of Onset   Diabetes Mother    Hypertension Mother    Colon polyps Sister    Hypertension Sister    Diabetes Sister    Lung cancer Maternal Grandmother    Colon cancer Neg Hx    Esophageal cancer Neg Hx    Pancreatic cancer Neg Hx    Stomach cancer Neg Hx    Liver disease Neg Hx     Social History   Tobacco Use   Smoking status: Former   Smokeless tobacco: Never  Vaping Use   Vaping Use: Never used  Substance Use Topics   Alcohol use: Yes    Comment: 2 [employer] hard drinks; now has only occasional as of 11/18/16    Drug use: No    Home Medications Prior to Admission medications   Medication Sig Start Date End Date Taking? Authorizing Provider  budesonide-formoterol (SYMBICORT) 160-4.5 MCG/ACT inhaler Inhale 2 puffs into the lungs 2 (two) times daily.    [provider]  busPIRone (BUSPAR) 15 MG tablet TAKE 1 TABLET(15 MG) BY MOUTH TWICE DAILY Patient taking differently: Take 15 mg by mouth 2 (two) times daily.  01/17/18   [name], PA-C  clotrimazole-betamethasone (LOTRISONE) cream Apply 1 application topically 2 (two) times daily. Patient taking differently: Apply 1 application topically 2 (two) times daily as needed (rash/irritation).  03/08/18   [name], PA-C  FEROSUL 325 (65 Fe) MG tablet Take 325 mg by mouth daily. 09/27/19   [provider]  fluconazole (DIFLUCAN) 200 MG tablet Take 1 tablet (200 mg total) by mouth daily at 6 PM. 11/15/19   Amin, Jeanella Flattery, MD  FLUoxetine (PROZAC) 40 MG capsule Take 1 capsule (40 mg total) by mouth daily. 04/12/18    [name], PA-C  folic acid (FOLVITE) 1 MG tablet Take 1 tablet (1 mg total) by mouth daily. 09/20/19   Mitzi Hansen, MD  lactulose ([place]) 10 GM/15ML solution TAKE 30ML BY MOUTH EVERY DAY Patient taking differently: Take 20 g by mouth daily as needed (constipation).  06/10/18   [name], MD  pantoprazole (PROTONIX) 40 MG tablet TAKE 1 TABLET(40 MG) BY MOUTH DAILY BEFORE BREAKFAST Patient taking differently: Take 40 mg by mouth daily before breakfast.  08/15/18   [name], MD  polyvinyl alcohol (ARTIFICIAL TEARS) 1.4 % ophthalmic solution Place 1 drop into both eyes daily as needed for dry eyes.     [provider]  propranolol (INDERAL) 20 MG tablet TAKE 1 TABLET(20 MG) BY MOUTH TWICE  DAILY Patient taking differently: Take 20 mg by mouth 2 (two) times daily.  06/01/18   [name], PA-C  QUEtiapine (SEROQUEL) 25 MG tablet Take 25 mg by mouth 2 (two) times daily as needed (anxiety).  11/01/19   [provider]  VENTOLIN HFA 108 (90 Base) MCG/ACT inhaler INHALE 2 PUFFS INTO THE LUNGS EVERY 6 HOURS AS NEEDED FOR WHEEZING OR SHORTNESS OF BREATH Patient taking differently: Inhale 2 puffs into the lungs every 6 (six) hours as needed for wheezing or shortness of breath.  05/19/18   [name], PA-C  Vitamin D, Ergocalciferol, (DRISDOL) 1.25 MG (50000 UNIT) CAPS capsule Take 50,000 Units by mouth every Monday. 05/01/19   [provider]    Allergies    Patient has no known allergies.  Review of Systems   Review of Systems  Constitutional:  Negative for chills and fever.  HENT:  Negative for ear pain and sore throat.   Eyes:  Negative for pain and visual disturbance.  Respiratory:  Positive for shortness of breath. Negative for cough.   Cardiovascular:  Negative for chest pain and palpitations.  Gastrointestinal:  Negative for abdominal pain and vomiting.  Genitourinary:  Negative for dysuria and hematuria.  Musculoskeletal:   Negative for arthralgias and back pain.  Skin:  Negative for color change and rash.  Neurological:  Negative for seizures and syncope.  Psychiatric/Behavioral:  The patient is nervous/anxious.   All other systems reviewed and are negative.  Physical Exam Updated Vital Signs BP (!) 160/88   Pulse 89   Temp 98.2 F (36.8 C)   Resp (!) 22   SpO2 100%   Physical Exam Vitals and nursing note reviewed.  Constitutional:      General: She is not in acute distress.    Appearance: She is well-developed.  HENT:     Head: Normocephalic and atraumatic.  Eyes:     Conjunctiva/sclera: Conjunctivae normal.  Cardiovascular:     Rate and Rhythm: Normal rate and regular rhythm.     Heart sounds: No murmur heard. Pulmonary:     Effort: Pulmonary effort is normal. No respiratory distress.     Breath sounds: Normal breath sounds.  Abdominal:     Palpations: Abdomen is soft.     Tenderness: There is no abdominal tenderness.  Musculoskeletal:     Cervical back: Neck supple.  Skin:    General: Skin is warm and dry.  Neurological:     General: No focal deficit present.     Mental Status: She is alert.  Psychiatric:        Mood and Affect: Mood normal.        Behavior: Behavior normal.    ED Results / Procedures / Treatments   Labs (all labs ordered are listed, but only abnormal results are displayed) Labs Reviewed  CBC WITH DIFFERENTIAL/PLATELET - Abnormal; Notable for the following components:      Result Value   WBC 17.5 (*)    Platelets 134 (*)    Neutro Abs 15.4 (*)    All other components within normal limits  BASIC METABOLIC PANEL - Abnormal; Notable for the following components:   Glucose, Bld 122 (*)    BUN 5 (*)    Calcium 8.8 (*)    All other components within normal limits  TROPONIN I (HIGH SENSITIVITY)    EKG EKG Interpretation  Date/Time:  Thursday May 01 2021 14:32:45 EDT Ventricular Rate:  90 PR Interval:  126 QRS Duration:  70 QT Interval:  360 QTC  Calculation: 440 R Axis:   18 Text Interpretation: Normal sinus rhythm Nonspecific T wave abnormality Abnormal ECG No acute changes QT has shortened Confirmed by Madalyn Rob [phone]) on 05/01/2021 8:31:49 PM  Radiology DG Chest 2 View  Result Date: 05/01/2021 CLINICAL DATA:  Wheezing, dyspnea EXAM: CHEST - 2 VIEW COMPARISON:  11/11/2019 chest radiograph. FINDINGS: Stable cardiomediastinal silhouette with normal heart size. No pneumothorax. No pleural effusion. Lungs appear clear, with no acute consolidative airspace disease and no pulmonary edema. IMPRESSION: No active cardiopulmonary disease. Electronically Signed   By: Ilona Sorrel M.D.   On: 05/01/2021 20:09    Procedures Procedures   Medications Ordered in ED Medications  albuterol (VENTOLIN HFA) 108 (90 Base) MCG/ACT inhaler 2 puff (2 puffs Inhalation Given 05/01/21 2011)  AeroChamber Plus Flo-Vu Large MISC 1 each (1 each Other Given 05/01/21 2011)    ED Course  I have reviewed the triage vital signs and the nursing notes.  Pertinent labs & imaging results that were available during my care of the patient were reviewed by me and considered in my medical decision making (see chart for details).    MDM Rules/Calculators/A&P                           55 year old lady presents to ER after having episode of shortness of breath.  On exam here she appears well in no distress.  Her lungs actually sound fairly clear.  No significant tachypnea or increased work of breathing.  Chest x-ray negative for pneumonia.  Basic labs noted for leukocytosis but otherwise normal.  No fever or chills or other infectious symptoms at present.  Suspect her episode may have been related to her underlying COPD versus anxiety.  Given her current well appearance and lack of ongoing symptoms, believe she is stable for discharge and outpatient management.  Recommend she follow-up with primary doctor, have WBCs rechecked.  Instructed on inhaler use with spacer  and discharged home.  After the discussed management above, the patient was determined to be safe for discharge.  The patient was in agreement with this plan and all questions regarding their care were answered.  ED return precautions were discussed and the patient will return to the ED with any significant worsening of condition.  Final Clinical Impression(s) / ED Diagnoses Final diagnoses:  Shortness of breath  Leukocytosis, unspecified type    Rx / DC Orders ED Discharge Orders     None        Lucrezia Starch, MD 05/02/21 2036

## 2021-05-01 NOTE — ED Triage Notes (Addendum)
Pt BIB EMS from home. Pt reports waking up this morning anxious and having a panic attack episode with SHOB. Pt denies SHOB at this time. Denies chest pain. Pt has hx of COPD.

## 2021-05-01 NOTE — ED Notes (Signed)
Pt given MDI w/spacer and educated on proper use of medication w/spacer. Pt also educated on smoking cessation and the importance of quitting to decrease damage to the lungs. RT also discussed COPD and educated pt on the importance of a PFT to evaluate her COPD and its severity/stage for better/more effective treatment. Pt asked how to get referral for this and RT advised pt to go through her PCP for a referral. RT will continue to monitor.

## 2021-05-01 NOTE — Discharge Instructions (Addendum)
Please follow-up with your primary care doctor.  Discussed her symptoms from today and her white blood cell count.  You should have your blood work rechecked sometime next week to monitor this.  Additionally discuss long-term management of your suspected COPD.  If you develop worsening difficulty breathing, chest pain or other new concerning symptom, return to ER for reassessment.

## 2021-05-12 ENCOUNTER — Ambulatory Visit: Payer: BLUE CROSS/BLUE SHIELD

## 2021-05-17 ENCOUNTER — Ambulatory Visit: Payer: BLUE CROSS/BLUE SHIELD

## 2022-01-08 ENCOUNTER — Emergency Department (HOSPITAL_COMMUNITY): Payer: BLUE CROSS/BLUE SHIELD

## 2022-01-08 ENCOUNTER — Inpatient Hospital Stay (HOSPITAL_COMMUNITY)
Admission: EM | Admit: 2022-01-08 | Discharge: 2022-01-13 | DRG: 917 | Disposition: A | Discharge status: Other Acute Inpatient Hospital | Payer: BLUE CROSS/BLUE SHIELD | Attending: Internal Medicine | Admitting: Internal Medicine

## 2022-01-08 DIAGNOSIS — R3589 Other polyuria: Secondary | ICD-10-CM | POA: Diagnosis present

## 2022-01-08 DIAGNOSIS — F329 Major depressive disorder, single episode, unspecified: Secondary | ICD-10-CM | POA: Diagnosis present

## 2022-01-08 DIAGNOSIS — Z833 Family history of diabetes mellitus: Secondary | ICD-10-CM

## 2022-01-08 DIAGNOSIS — I10 Essential (primary) hypertension: Secondary | ICD-10-CM | POA: Diagnosis present

## 2022-01-08 DIAGNOSIS — Z90710 Acquired absence of both cervix and uterus: Secondary | ICD-10-CM

## 2022-01-08 DIAGNOSIS — E8729 Other acidosis: Secondary | ICD-10-CM | POA: Diagnosis present

## 2022-01-08 DIAGNOSIS — G9341 Metabolic encephalopathy: Principal | ICD-10-CM

## 2022-01-08 DIAGNOSIS — F1014 Alcohol abuse with alcohol-induced mood disorder: Secondary | ICD-10-CM

## 2022-01-08 DIAGNOSIS — I700 Atherosclerosis of aorta: Secondary | ICD-10-CM | POA: Diagnosis present

## 2022-01-08 DIAGNOSIS — K7030 Alcoholic cirrhosis of liver without ascites: Secondary | ICD-10-CM | POA: Diagnosis present

## 2022-01-08 DIAGNOSIS — F10229 Alcohol dependence with intoxication, unspecified: Secondary | ICD-10-CM | POA: Diagnosis present

## 2022-01-08 DIAGNOSIS — J449 Chronic obstructive pulmonary disease, unspecified: Secondary | ICD-10-CM | POA: Diagnosis present

## 2022-01-08 DIAGNOSIS — K760 Fatty (change of) liver, not elsewhere classified: Secondary | ICD-10-CM | POA: Diagnosis present

## 2022-01-08 DIAGNOSIS — T450X1A Poisoning by antiallergic and antiemetic drugs, accidental (unintentional), initial encounter: Secondary | ICD-10-CM | POA: Diagnosis present

## 2022-01-08 DIAGNOSIS — F1024 Alcohol dependence with alcohol-induced mood disorder: Secondary | ICD-10-CM | POA: Diagnosis present

## 2022-01-08 DIAGNOSIS — F419 Anxiety disorder, unspecified: Secondary | ICD-10-CM | POA: Diagnosis present

## 2022-01-08 DIAGNOSIS — Y908 Blood alcohol level of 240 mg/100 ml or more: Secondary | ICD-10-CM | POA: Diagnosis present

## 2022-01-08 DIAGNOSIS — Z7951 Long term (current) use of inhaled steroids: Secondary | ICD-10-CM

## 2022-01-08 DIAGNOSIS — Z20822 Contact with and (suspected) exposure to covid-19: Secondary | ICD-10-CM | POA: Diagnosis present

## 2022-01-08 DIAGNOSIS — G9340 Encephalopathy, unspecified: Secondary | ICD-10-CM

## 2022-01-08 DIAGNOSIS — T43591A Poisoning by other antipsychotics and neuroleptics, accidental (unintentional), initial encounter: Secondary | ICD-10-CM | POA: Diagnosis present

## 2022-01-08 DIAGNOSIS — Z8614 Personal history of Methicillin resistant Staphylococcus aureus infection: Secondary | ICD-10-CM

## 2022-01-08 DIAGNOSIS — Z801 Family history of malignant neoplasm of trachea, bronchus and lung: Secondary | ICD-10-CM

## 2022-01-08 DIAGNOSIS — R45851 Suicidal ideations: Secondary | ICD-10-CM | POA: Diagnosis present

## 2022-01-08 DIAGNOSIS — J9602 Acute respiratory failure with hypercapnia: Secondary | ICD-10-CM | POA: Diagnosis not present

## 2022-01-08 DIAGNOSIS — G928 Other toxic encephalopathy: Secondary | ICD-10-CM | POA: Diagnosis present

## 2022-01-08 DIAGNOSIS — E876 Hypokalemia: Secondary | ICD-10-CM | POA: Diagnosis present

## 2022-01-08 DIAGNOSIS — T510X1A Toxic effect of ethanol, accidental (unintentional), initial encounter: Secondary | ICD-10-CM | POA: Diagnosis not present

## 2022-01-08 DIAGNOSIS — Z8674 Personal history of sudden cardiac arrest: Secondary | ICD-10-CM

## 2022-01-08 DIAGNOSIS — R1013 Epigastric pain: Secondary | ICD-10-CM | POA: Diagnosis present

## 2022-01-08 DIAGNOSIS — R092 Respiratory arrest: Secondary | ICD-10-CM | POA: Diagnosis present

## 2022-01-08 DIAGNOSIS — R680 Hypothermia, not associated with low environmental temperature: Secondary | ICD-10-CM | POA: Diagnosis present

## 2022-01-08 DIAGNOSIS — Z8371 Family history of colonic polyps: Secondary | ICD-10-CM

## 2022-01-08 DIAGNOSIS — Z79899 Other long term (current) drug therapy: Secondary | ICD-10-CM

## 2022-01-08 DIAGNOSIS — Z8249 Family history of ischemic heart disease and other diseases of the circulatory system: Secondary | ICD-10-CM

## 2022-01-08 DIAGNOSIS — J9601 Acute respiratory failure with hypoxia: Secondary | ICD-10-CM | POA: Diagnosis present

## 2022-01-08 DIAGNOSIS — Y92009 Unspecified place in unspecified non-institutional (private) residence as the place of occurrence of the external cause: Secondary | ICD-10-CM

## 2022-01-08 DIAGNOSIS — G4700 Insomnia, unspecified: Secondary | ICD-10-CM | POA: Diagnosis present

## 2022-01-08 LAB — CBC WITH DIFFERENTIAL/PLATELET
Abs Immature Granulocytes: 0.1 10*3/uL — ABNORMAL HIGH (ref 0.00–0.07)
Basophils Absolute: 0 10*3/uL (ref 0.0–0.1)
Basophils Relative: 0 %
Eosinophils Absolute: 0 10*3/uL (ref 0.0–0.5)
Eosinophils Relative: 0 %
HCT: 40.5 % (ref 36.0–46.0)
Hemoglobin: 14.5 g/dL (ref 12.0–15.0)
Immature Granulocytes: 1 %
Lymphocytes Relative: 14 %
Lymphs Abs: 1.2 10*3/uL (ref 0.7–4.0)
MCH: 33.9 pg (ref 26.0–34.0)
MCHC: 35.8 g/dL (ref 30.0–36.0)
MCV: 94.6 fL (ref 80.0–100.0)
Monocytes Absolute: 0.4 10*3/uL (ref 0.1–1.0)
Monocytes Relative: 4 %
Neutro Abs: 6.5 10*3/uL (ref 1.7–7.7)
Neutrophils Relative %: 81 %
Platelets: 158 10*3/uL (ref 150–400)
RBC: 4.28 MIL/uL (ref 3.87–5.11)
RDW: 12.5 % (ref 11.5–15.5)
WBC: 8.2 10*3/uL (ref 4.0–10.5)
nRBC: 0 % (ref 0.0–0.2)

## 2022-01-08 LAB — I-STAT ARTERIAL BLOOD GAS, ED
Acid-Base Excess: 0 mmol/L (ref 0.0–2.0)
Bicarbonate: 27.3 mmol/L (ref 20.0–28.0)
Calcium, Ion: 1.08 mmol/L — ABNORMAL LOW (ref 1.15–1.40)
HCT: 43 % (ref 36.0–46.0)
Hemoglobin: 14.6 g/dL (ref 12.0–15.0)
O2 Saturation: 100 %
Patient temperature: 98.6
Potassium: 2.8 mmol/L — ABNORMAL LOW (ref 3.5–5.1)
Sodium: 140 mmol/L (ref 135–145)
TCO2: 29 mmol/L (ref 22–32)
pCO2 arterial: 53.4 mmHg — ABNORMAL HIGH (ref 32–48)
pH, Arterial: 7.317 — ABNORMAL LOW (ref 7.35–7.45)
pO2, Arterial: 591 mmHg — ABNORMAL HIGH (ref 83–108)

## 2022-01-08 LAB — CK: Total CK: 195 U/L (ref 38–234)

## 2022-01-08 LAB — URINALYSIS, ROUTINE W REFLEX MICROSCOPIC
Bilirubin Urine: NEGATIVE
Glucose, UA: NEGATIVE mg/dL
Hgb urine dipstick: NEGATIVE
Ketones, ur: NEGATIVE mg/dL
Leukocytes,Ua: NEGATIVE
Nitrite: NEGATIVE
Protein, ur: 30 mg/dL — AB
Specific Gravity, Urine: 1.004 — ABNORMAL LOW (ref 1.005–1.030)
pH: 8 (ref 5.0–8.0)

## 2022-01-08 LAB — COMPREHENSIVE METABOLIC PANEL
ALT: 42 U/L (ref 0–44)
AST: 60 U/L — ABNORMAL HIGH (ref 15–41)
Albumin: 4.3 g/dL (ref 3.5–5.0)
Alkaline Phosphatase: 140 U/L — ABNORMAL HIGH (ref 38–126)
Anion gap: 14 (ref 5–15)
BUN: 6 mg/dL (ref 6–20)
CO2: 25 mmol/L (ref 22–32)
Calcium: 8.7 mg/dL — ABNORMAL LOW (ref 8.9–10.3)
Chloride: 100 mmol/L (ref 98–111)
Creatinine, Ser: 0.82 mg/dL (ref 0.44–1.00)
GFR, Estimated: >60 mL/min (ref >60)
Glucose, Bld: 129 mg/dL — ABNORMAL HIGH (ref 70–99)
Potassium: 3.3 mmol/L — ABNORMAL LOW (ref 3.5–5.1)
Sodium: 139 mmol/L (ref 135–145)
Total Bilirubin: 0.5 mg/dL (ref 0.3–1.2)
Total Protein: 7.5 g/dL (ref 6.5–8.1)

## 2022-01-08 LAB — RAPID URINE DRUG SCREEN, HOSP PERFORMED
Amphetamines: NOT DETECTED
Barbiturates: NOT DETECTED
Benzodiazepines: NOT DETECTED
Cocaine: NOT DETECTED
Opiates: NOT DETECTED
Tetrahydrocannabinol: NOT DETECTED

## 2022-01-08 LAB — TROPONIN I (HIGH SENSITIVITY): Troponin I (High Sensitivity): 8 ng/L (ref <18)

## 2022-01-08 LAB — SALICYLATE LEVEL: Salicylate Lvl: <7 mg/dL — ABNORMAL LOW (ref 7.0–30.0)

## 2022-01-08 LAB — ACETAMINOPHEN LEVEL: Acetaminophen (Tylenol), Serum: <10 ug/mL — ABNORMAL LOW (ref 10–30)

## 2022-01-08 LAB — LIPASE, BLOOD: Lipase: 40 U/L (ref 11–51)

## 2022-01-08 LAB — AMMONIA: Ammonia: 50 umol/L — ABNORMAL HIGH (ref 9–35)

## 2022-01-08 LAB — ETHANOL: Alcohol, Ethyl (B): 291 mg/dL — ABNORMAL HIGH (ref <10)

## 2022-01-08 MED ORDER — THIAMINE HCL 100 MG/ML IJ SOLN
100.0000 mg | Freq: Once | INTRAMUSCULAR | Status: AC
  Administered 2022-01-09: 100 mg via INTRAVENOUS
  Filled 2022-01-08: qty 2

## 2022-01-08 MED ORDER — NALOXONE HCL 2 MG/2ML IJ SOSY
PREFILLED_SYRINGE | INTRAMUSCULAR | Status: DC | PRN
  Administered 2022-01-08: 2 mg via INTRAVENOUS

## 2022-01-08 MED ORDER — PROPOFOL 1000 MG/100ML IV EMUL
5.0000 ug/kg/min | INTRAVENOUS | Status: DC
  Administered 2022-01-08: 20 ug/kg/min via INTRAVENOUS
  Administered 2022-01-09: 30 ug/kg/min via INTRAVENOUS
  Filled 2022-01-08 (×2): qty 100

## 2022-01-08 MED ORDER — MIDAZOLAM HCL 2 MG/2ML IJ SOLN
INTRAMUSCULAR | Status: AC
  Filled 2022-01-08: qty 2

## 2022-01-08 MED ORDER — MIDAZOLAM HCL 2 MG/2ML IJ SOLN
2.0000 mg | INTRAMUSCULAR | Status: DC | PRN
Start: 2022-01-08 — End: 2022-01-09
  Administered 2022-01-09 (×2): 2 mg via INTRAVENOUS
  Filled 2022-01-08 (×2): qty 2

## 2022-01-08 MED ORDER — ETOMIDATE 2 MG/ML IV SOLN
INTRAVENOUS | Status: DC | PRN
  Administered 2022-01-08: 20 mg via INTRAVENOUS

## 2022-01-08 MED ORDER — ROCURONIUM BROMIDE 50 MG/5ML IV SOLN
INTRAVENOUS | Status: DC | PRN
  Administered 2022-01-08: 80 mg via INTRAVENOUS

## 2022-01-08 MED ORDER — MIDAZOLAM HCL 5 MG/5ML IJ SOLN
INTRAMUSCULAR | Status: DC | PRN
  Administered 2022-01-08: 2 mg via INTRAVENOUS

## 2022-01-08 MED ORDER — MIDAZOLAM HCL 2 MG/2ML IJ SOLN
2.0000 mg | INTRAMUSCULAR | Status: AC | PRN
  Administered 2022-01-08 – 2022-01-09 (×3): 2 mg via INTRAVENOUS
  Filled 2022-01-08 (×3): qty 2

## 2022-01-08 NOTE — Progress Notes (Signed)
Pt transported by RT and RNx2 from ED TRAA to CT01 and back w/o complications

## 2022-01-08 NOTE — ED Triage Notes (Signed)
Pt bib [facility] as respiratory arrest. Ems called by pt's husband that found patient down. EMS found pt to be unresponsive and apneic. Ventilations assisted on arrival. Pupils 4 and non reactive with ems.    BP 128/50, HR 90, CBG 138

## 2022-01-08 NOTE — ED Provider Notes (Incomplete)
[name] Provider Note   CSN: [id number] Arrival date & time:        History  Chief Complaint  Patient presents with   Respiratory Arrest    Amy Villa is a 56 y.o. female.  56 year old female with a past medical history of alcoholic cirrhosis with known esophageal varices, COPD, hypertension presents the ED after being found down by her husband approximately 1 hour ago.  Patient was found lying next to a pile of wet kitty litter, unknown downtime.  Upon EMS arrival, patient was unresponsive with a GCS of 3 and had agonal respirations requiring BVM.  Point-of-care glucose 138.  In route, she was otherwise hemodynamically stable.  No pinpoint pupils noted thus Narcan was not given.  The history is provided by the EMS personnel and medical records.       Home Medications Prior to Admission medications   Medication Sig Start Date End Date Taking? Authorizing Provider  budesonide-formoterol (SYMBICORT) 160-4.5 MCG/ACT inhaler Inhale 2 puffs into the lungs 2 (two) times daily.    [provider]  busPIRone (BUSPAR) 15 MG tablet TAKE 1 TABLET(15 MG) BY MOUTH TWICE DAILY Patient taking differently: Take 15 mg by mouth 2 (two) times daily.  01/17/18   [name], PA-C  clotrimazole-betamethasone (LOTRISONE) cream Apply 1 application topically 2 (two) times daily. Patient taking differently: Apply 1 application topically 2 (two) times daily as needed (rash/irritation).  03/08/18   [name], PA-C  FEROSUL 325 (65 Fe) MG tablet Take 325 mg by mouth daily. 09/27/19   [provider]  fluconazole (DIFLUCAN) 200 MG tablet Take 1 tablet (200 mg total) by mouth daily at 6 PM. 11/15/19   Amin, Jeanella Flattery, MD  FLUoxetine (PROZAC) 40 MG capsule Take 1 capsule (40 mg total) by mouth daily. 04/12/18   [name], PA-C  folic acid (FOLVITE) 1 MG tablet Take 1 tablet (1 mg total) by mouth daily. 09/20/19   Mitzi Hansen, MD  lactulose ([place]) 10 GM/15ML solution TAKE 30ML BY MOUTH EVERY DAY Patient taking differently: Take 20 g by mouth daily as needed (constipation).  06/10/18   [name], MD  pantoprazole (PROTONIX) 40 MG tablet TAKE 1 TABLET(40 MG) BY MOUTH DAILY BEFORE BREAKFAST Patient taking differently: Take 40 mg by mouth daily before breakfast.  08/15/18   [name], MD  polyvinyl alcohol (ARTIFICIAL TEARS) 1.4 % ophthalmic solution Place 1 drop into both eyes daily as needed for dry eyes.     [provider]  propranolol (INDERAL) 20 MG tablet TAKE 1 TABLET(20 MG) BY MOUTH TWICE DAILY Patient taking differently: Take 20 mg by mouth 2 (two) times daily.  06/01/18   [name], PA-C  QUEtiapine (SEROQUEL) 25 MG tablet Take 25 mg by mouth 2 (two) times daily as needed (anxiety).  11/01/19   [provider]  VENTOLIN HFA 108 (90 Base) MCG/ACT inhaler INHALE 2 PUFFS INTO THE LUNGS EVERY 6 HOURS AS NEEDED FOR WHEEZING OR SHORTNESS OF BREATH Patient taking differently: Inhale 2 puffs into the lungs every 6 (six) hours as needed for wheezing or shortness of breath.  05/19/18   [name], PA-C  Vitamin D, Ergocalciferol, (DRISDOL) 1.25 MG (50000 UNIT) CAPS capsule Take 50,000 Units by mouth every Monday. 05/01/19   [provider]      Allergies    Patient has no known allergies.    Review of Systems   Review  of Systems  Unable to perform ROS: Patient unresponsive    Physical Exam Updated Vital Signs BP (!) 158/107   Pulse (!) 122   Temp (!) 96.1 F (35.6 C) (Temporal)   Resp (!) 25   Ht '5\' 3"'$  (1.6 m)   Wt 65 kg   SpO2 100%   BMI 25.38 kg/m  Physical Exam Constitutional:      General: She is in acute distress.     Appearance: She is ill-appearing and toxic-appearing.     Interventions: Cervical collar in place.  HENT:     Head:     Comments: Hematoma present to the left frontal scalp    Mouth/Throat:     Mouth: Mucous membranes  are dry.  Eyes:     Comments: Pupils 41m, equal and sluggishly reactive  Cardiovascular:     Rate and Rhythm: Regular rhythm. Tachycardia present.  Pulmonary:     Breath sounds: Rhonchi present.     Comments: Coarse breath sounds noted throughout all lung fields Abdominal:     General: There is distension.     Palpations: Abdomen is soft.  Neurological:     GCS: GCS eye subscore is 1. GCS verbal subscore is 1. GCS motor subscore is 1.     ED Results / Procedures / Treatments   Labs (all labs ordered are listed, but only abnormal results are displayed) Labs Reviewed  RESP PANEL BY RT-PCR (FLU A&B, COVID) ARPGX2  COMPREHENSIVE METABOLIC PANEL  BRAIN NATRIURETIC PEPTIDE  CBC WITH DIFFERENTIAL/PLATELET  ETHANOL  LIPASE, BLOOD  SALICYLATE LEVEL  ACETAMINOPHEN LEVEL  URINALYSIS, ROUTINE W REFLEX MICROSCOPIC  RAPID URINE DRUG SCREEN, HOSP PERFORMED  TRIGLYCERIDES  CK  AMMONIA  I-STAT ARTERIAL BLOOD GAS, ED  TROPONIN I (HIGH SENSITIVITY)    EKG None  Radiology No results found.  Procedures Procedure Name: Intubation Date/Time: 01/08/2022 10:44 PM  Performed by: Devontre Siedschlag, JMartinique MDPre-anesthesia Checklist: Suction available, Patient identified, Patient being monitored and Emergency Drugs available Oxygen Delivery Method: Ambu bag Preoxygenation: Pre-oxygenation with 100% oxygen Induction Type: Rapid sequence Ventilation: Mask ventilation without difficulty Laryngoscope Size: Glidescope Grade View: Grade I Tube size: 7.0 mm Number of attempts: 2 Airway Equipment and Method: Rigid stylet Secured at: 23 (teeth) cm Tube secured with: ETT holder Difficulty Due To: Difficult Airway- due to cervical collar Comments: Initial attempt with 7.5 ETT with anterior and small glottis. Had desat into 60s and was successfully bagged back to 100%. Second pass attempt successful with 7.0 ETT.       Medications Ordered in ED Medications  naloxone ([facility] injection (2 mg  Intravenous Given 01/08/22 2225)  midazolam (VERSED) 5 MG/5ML injection (2 mg Intravenous Given 01/08/22 2231)  etomidate (AMIDATE) injection (20 mg Intravenous Given 01/08/22 2232)  rocuronium ([facility] injection (80 mg Intravenous Given 01/08/22 2232)  midazolam (VERSED) 2 MG/2ML injection (has no administration in time range)  midazolam (VERSED) injection 2 mg (has no administration in time range)  midazolam (VERSED) injection 2 mg (has no administration in time range)  propofol (DIPRIVAN) 1000 MG/100ML infusion (20 mcg/kg/min  65 kg Intravenous New Bag/Given 01/08/22 2248)    ED Course/ Medical Decision Making/ A&P                           Medical Decision Making Amount and/or Complexity of Data Reviewed Labs: ordered. Radiology: ordered.  Risk Prescription drug management.   ***  {Document critical care time when appropriate:1} {Document review of  labs and clinical decision tools ie heart score, Chads2Vasc2 etc:1}  {Document your independent review of radiology images, and any outside records:1} {Document your discussion with family members, caretakers, and with consultants:1} {Document social determinants of health affecting pt's care:1} {Document your decision making why or why not admission, treatments were needed:1} Final Clinical Impression(s) / ED Diagnoses Final diagnoses:  None    Rx / DC Orders ED Discharge Orders     None

## 2022-01-08 NOTE — H&P (Signed)
NAME:  Amy Villa, MRN:  [id number], DOB:  1966-03-30, LOS: 0 ADMISSION DATE:  01/08/2022, CONSULTATION DATE:  01/08/2022  REFERRING MD:  ED P, Long CHIEF COMPLAINT: Encephalopathy, intubated  History of Present Illness:  56 year old woman brought in by EMS after being found unresponsive by her husband, unknown downtime.  Being bagged, GCS of 3, CBG 138, pupils 4 mm, given Narcan in the ED without response, intubated on second attempt with 7.0 ET tube, sedated with propofol Head CT negative CT cervical spine -degenerative changes ABG 7.3 2/53/591. Labs showed hypokalemia, AST/ALT 60/42 , CK1 95, no leukocytosis UDS negative EtOH 291   Pertinent  Medical History  Alcoholic cirrhosis with esophageal varices COPD Hypertension Anxiety and depression -psychiatric hospitalization for suicide in 2014 Cardiac arrest 10/2019 due to polypharmacy  Significant Hospital Events: Including procedures, antibiotic start and stop dates in addition to other pertinent events   5/30 televisit with psychiatry reviewed -on Zyprexa increased to 7.5, started duloxetine 30 mg, prescribed Belsomra 20 mg last week but not taking, prescribed Rexulti but not taking, advised not to take with Zyprexa , medication trials Prozac, buspirone, Seroquel 25 twice daily as needed, sertraline BuSpar and mirtazapine, Abilify  Interim History / Subjective:  Intubated and unresponsive Attempted to call spouse Dorene Grebe at listed number but unable to reach to obtain more history Hypothermic, on warmer  Objective   Blood pressure (!) 142/104, pulse 98, temperature (!) 95.5 F (35.3 C), resp. rate (!) 22, height 5\' 3"  (1.6 m), weight 65 kg, SpO2 100 %.    Vent Mode: PRVC FiO2 (%):  [40 %-100 %] 40 % Set Rate:  [20 bmp-22 bmp] 22 bmp Vt Set:  [400 mL-420 mL] 420 mL PEEP:  [5 cmH20] 5 cmH20 Plateau Pressure:  [13 cmH20] 13 cmH20  No intake or output data in the 24 hours ending 01/08/22 2351 Filed Weights    01/08/22 2232  Weight: 65 kg    Examination: General: Middle-age woman, intubated, unresponsive, sedated on low-dose propofol drip HENT: Pupils 4 mm bilateral reactive to light, no pallor, icterus Lungs: Clear to auscultation, prolonged expiration on vent waveform, no accessory muscle use Cardiovascular: S1-S2 regular, no murmur Abdomen: Soft, nontender, no guarding no bowel splenomegaly Extremities: No deformities, no edema Neuro: Unresponsive, RASS -3, no meningeal signs GU: Clear urine  Chest x-ray independently reviewed, ET tube in position, no infiltrates  Resolved Hospital Problem list     Assessment & Plan:  Acute metabolic encephalopathy -Negative imaging , hypercarbia is mild and probably result rather than cause -Appears to be due to alcohol intoxication with possible polypharmacy -noted Belsomra, Seroquel, Zyprexa -Cannot rule out suicidal attempt, other drug not picked up on UDS -Check serum osm, mild AG + 14  Plan -supportive care -Propofol with intermittent fentanyl for sedation, goal RASS 0 to -1 -Check ammonia for completion -Will need more detailed history once extubated to rule out intentional overdose  Acute hypercarbic respiratory failure Possible underlying COPD -Vent settings reviewed and adjusted -RR to 22, prolonged expiration, mild autoPEEP -DuoNebs 4 times daily  Hypothermia -due to about, doubt sepsis Warmer  Hypokalemia will be repleted aggressively since polyuric  Alcoholic cirrhosis Alcohol intoxication  -Await ammonia level, if high will start lactulose -Watch for withdrawal  Best Practice (right click and "Reselect all SmartList Selections" daily)   Diet/type: NPO w/ meds via tube DVT prophylaxis: LMWH GI prophylaxis: PPI Lines: N/A Foley:  N/A Code Status:  full code Last date of multidisciplinary goals of  care discussion [NA] attempted to reach husband but unable  Labs   CBC: Recent Labs  Lab 01/08/22 2252 01/08/22 2305   WBC 8.2  --   NEUTROABS 6.5  --   HGB 14.5 14.6  HCT 40.5 43.0  MCV 94.6  --   PLT 158  --     Basic Metabolic Panel: Recent Labs  Lab 01/08/22 2252 01/08/22 2305  NA 139 140  K 3.3* 2.8*  CL 100  --   CO2 25  --   GLUCOSE 129*  --   BUN 6  --   CREATININE 0.82  --   CALCIUM 8.7*  --    GFR: Estimated Creatinine Clearance: 70.2 mL/min (by C-G formula based on SCr of 0.82 mg/dL). Recent Labs  Lab 01/08/22 2252  WBC 8.2    Liver Function Tests: Recent Labs  Lab 01/08/22 2252  AST 60*  ALT 42  ALKPHOS 140*  BILITOT 0.5  PROT 7.5  ALBUMIN 4.3   Recent Labs  Lab 01/08/22 2252  LIPASE 40   Recent Labs  Lab 01/08/22 2233  AMMONIA 50*    ABG    Component Value Date/Time   PHART 7.317 (L) 01/08/2022 2305   PCO2ART 53.4 (H) 01/08/2022 2305   PO2ART 591 (H) 01/08/2022 2305   HCO3 27.3 01/08/2022 2305   TCO2 29 01/08/2022 2305   ACIDBASEDEF 2.0 11/11/2019 0607   O2SAT 100 01/08/2022 2305     Coagulation Profile: No results for input(s): "INR", "PROTIME" in the last 168 hours.  Cardiac Enzymes: Recent Labs  Lab 01/08/22 2252  CKTOTAL 195    HbA1C: Hgb A1c MFr Bld  Date/Time Value Ref Range Status  11/30/2016 03:13 PM 6.0 4.6 - 6.5 % Final    Comment:    Glycemic Control Guidelines for People with Diabetes:Non Diabetic:  <6%Goal of Therapy: <7%Additional Action Suggested:  >8%   09/12/2008 01:25 AM  4.6 - 6.1 % Final   5.8 (NOTE)   The ADA recommends the following therapeutic goal for glycemic   control related to Hgb A1C measurement:   Goal of Therapy:   < 7.0% Hgb A1C   Reference: American Diabetes Association: Clinical Practice   Recommendations 2008, Diabetes Care,  2008, 31:(Suppl 1).    CBG: No results for input(s): "GLUCAP" in the last 168 hours.  Review of Systems:   Unable to obtain since intubated and unresponsive  Past Medical History:  She,  has a past medical history of Alcoholic hepatitis, Aortic atherosclerosis (HCC),  Arthritis, Carpal tunnel syndrome on both sides (02/2012), Cigarette nicotine dependence, Contact lens/glasses fitting, Dental crowns present, Elevated LFTs, Fatty liver, Fluid retention, History of MRSA infection, Hypertension, Insomnia, Migraines, Osteoarthritis, Parasomnia, and Wears partial dentures.   Surgical History:   Past Surgical History:  Procedure Laterality Date   ANKLE ARTHROSCOPY WITH RECONSTRUCTION  07/2017   BIOPSY  02/24/2018   Procedure: BIOPSY;  Surgeon: Lynann Bologna, MD;  Location: [facility];  Service: Endoscopy;;   CARPAL TUNNEL RELEASE  03/17/2012   Procedure: CARPAL TUNNEL RELEASE;  Surgeon: Dominica Severin, MD;  Location: [facility];  Service: Orthopedics;  Laterality: Bilateral;  left limited open carpal tunnel release. right carpal tunnel injection with 1cc of celestone and 2cc of marcaine.25%   CARPAL TUNNEL RELEASE  06/30/2012   Procedure: CARPAL TUNNEL RELEASE;  Surgeon: Dominica Severin, MD;  Location: [facility];  Service: Orthopedics;  Laterality: Right;  RIGHT LIMITED OPEN CARPAL TUNNEL RELEASE   CARPOMETACARPAL (CMC) FUSION OF  THUMB Left 06/23/2013   Procedure: LEFT CARPOMETACARPEL (CMC) FUSION OF THUMB WITH AUTOGRAFT FROM RADIUS AND REPAIR AS NECESSARY;  Surgeon: Dominica Severin, MD;  Location: [facility];  Service: Orthopedics;  Laterality: Left;   COLONOSCOPY  2014   ESOPHAGOGASTRODUODENOSCOPY  05/2017   ESOPHAGOGASTRODUODENOSCOPY (EGD) WITH PROPOFOL N/A 02/24/2018   Procedure: ESOPHAGOGASTRODUODENOSCOPY (EGD) WITH PROPOFOL;  Surgeon: Lynann Bologna, MD;  Location: [facility];  Service: Endoscopy;  Laterality: N/A;   HARDWARE REMOVAL Left 07/10/2013   Procedure: HARDWARE REMOVAL;  Surgeon: Dominica Severin, MD;  Location: [facility];  Service: Orthopedics;  Laterality: Left;   INCISION AND DRAINAGE OF WOUND Left 07/10/2013   Procedure: IRRIGATION AND DEBRIDEMENT WOUND left wrist  ;  Surgeon: Dominica Severin, MD;  Location: [facility];  Service:  Orthopedics;  Laterality: Left;   LAPAROSCOPIC VAGINAL HYSTERECTOMY  02/24/2010   LAPAROSCOPY  06/2010   for adhesions   LEEP     x 2   LUMBAR DISC SURGERY  03/23/2003   left L5-S1 discectomy with microdissection   LUMBAR DISC SURGERY  05/23/2003   redo discectomy L5-S1 with microdissection     Social History:   reports that she has quit smoking. She has never used smokeless tobacco. She reports current alcohol use. She reports that she does not use drugs.   Family History:  Her family history includes Colon polyps in her sister; Diabetes in her mother and sister; Hypertension in her mother and sister; Lung cancer in her maternal grandmother. There is no history of Colon cancer, Esophageal cancer, Pancreatic cancer, Stomach cancer, or Liver disease.   Allergies No Known Allergies   Home Medications  Prior to Admission medications   Medication Sig Start Date End Date Taking? Authorizing Provider  budesonide-formoterol (SYMBICORT) 160-4.5 MCG/ACT inhaler Inhale 2 puffs into the lungs 2 (two) times daily.    [provider]  busPIRone (BUSPAR) 15 MG tablet TAKE 1 TABLET(15 MG) BY MOUTH TWICE DAILY Patient taking differently: Take 15 mg by mouth 2 (two) times daily.  01/17/18   Waldon Merl, PA-C  clotrimazole-betamethasone (LOTRISONE) cream Apply 1 application topically 2 (two) times daily. Patient taking differently: Apply 1 application topically 2 (two) times daily as needed (rash/irritation).  03/08/18   Waldon Merl, PA-C  FEROSUL 325 (65 Fe) MG tablet Take 325 mg by mouth daily. 09/27/19   [provider]  fluconazole (DIFLUCAN) 200 MG tablet Take 1 tablet (200 mg total) by mouth daily at 6 PM. 11/15/19   Amin, Loura Halt, MD  FLUoxetine (PROZAC) 40 MG capsule Take 1 capsule (40 mg total) by mouth daily. 04/12/18   Waldon Merl, PA-C  folic acid (FOLVITE) 1 MG tablet Take 1 tablet (1 mg total) by mouth daily. 09/20/19   Elige Radon, MD  lactulose  (CHRONULAC) 10 GM/15ML solution TAKE BY MOUTH EVERY DAY Patient taking differently: Take 20 g by mouth daily as needed (constipation).  06/10/18   Iva Boop, MD  pantoprazole (PROTONIX) 40 MG tablet TAKE 1 TABLET(40 MG) BY MOUTH DAILY BEFORE BREAKFAST Patient taking differently: Take 40 mg by mouth daily before breakfast.  08/15/18   Iva Boop, MD  polyvinyl alcohol (ARTIFICIAL TEARS) 1.4 % ophthalmic solution Place 1 drop into both eyes daily as needed for dry eyes.     [provider]  propranolol (INDERAL) 20 MG tablet TAKE 1 TABLET(20 MG) BY MOUTH TWICE DAILY Patient taking differently: Take 20 mg by mouth 2 (two) times daily.  06/01/18  Waldon Merl, PA-C  QUEtiapine (SEROQUEL) 25 MG tablet Take 25 mg by mouth 2 (two) times daily as needed (anxiety).  11/01/19   [provider]  VENTOLIN HFA 108 (90 Base) MCG/ACT inhaler INHALE 2 PUFFS INTO THE LUNGS EVERY 6 HOURS AS NEEDED FOR WHEEZING OR SHORTNESS OF BREATH Patient taking differently: Inhale 2 puffs into the lungs every 6 (six) hours as needed for wheezing or shortness of breath.  05/19/18   Waldon Merl, PA-C  Vitamin D, Ergocalciferol, (DRISDOL) 1.25 MG (50000 UNIT) CAPS capsule Take 50,000 Units by mouth every Monday. 05/01/19   [provider]     Critical care time: 43 m       Cyril Mourning MD. FCCP. [facility] Pager : [phone]  If no response to pager , please call [phone] until 7 pm After 7:00 pm call Elink  [phone]   01/08/2022

## 2022-01-09 ENCOUNTER — Other Ambulatory Visit: Payer: Self-pay

## 2022-01-09 ENCOUNTER — Inpatient Hospital Stay (HOSPITAL_COMMUNITY): Payer: BLUE CROSS/BLUE SHIELD

## 2022-01-09 DIAGNOSIS — G9341 Metabolic encephalopathy: Secondary | ICD-10-CM | POA: Diagnosis not present

## 2022-01-09 DIAGNOSIS — J449 Chronic obstructive pulmonary disease, unspecified: Secondary | ICD-10-CM | POA: Diagnosis present

## 2022-01-09 DIAGNOSIS — E8729 Other acidosis: Secondary | ICD-10-CM | POA: Diagnosis present

## 2022-01-09 DIAGNOSIS — R092 Respiratory arrest: Secondary | ICD-10-CM | POA: Diagnosis present

## 2022-01-09 DIAGNOSIS — R45851 Suicidal ideations: Secondary | ICD-10-CM | POA: Diagnosis present

## 2022-01-09 DIAGNOSIS — Z79899 Other long term (current) drug therapy: Secondary | ICD-10-CM | POA: Diagnosis not present

## 2022-01-09 DIAGNOSIS — Y92009 Unspecified place in unspecified non-institutional (private) residence as the place of occurrence of the external cause: Secondary | ICD-10-CM | POA: Diagnosis not present

## 2022-01-09 DIAGNOSIS — J9601 Acute respiratory failure with hypoxia: Secondary | ICD-10-CM | POA: Diagnosis present

## 2022-01-09 DIAGNOSIS — Z8674 Personal history of sudden cardiac arrest: Secondary | ICD-10-CM | POA: Diagnosis not present

## 2022-01-09 DIAGNOSIS — Z7951 Long term (current) use of inhaled steroids: Secondary | ICD-10-CM | POA: Diagnosis not present

## 2022-01-09 DIAGNOSIS — R680 Hypothermia, not associated with low environmental temperature: Secondary | ICD-10-CM | POA: Diagnosis present

## 2022-01-09 DIAGNOSIS — T450X1A Poisoning by antiallergic and antiemetic drugs, accidental (unintentional), initial encounter: Secondary | ICD-10-CM | POA: Diagnosis present

## 2022-01-09 DIAGNOSIS — T43591A Poisoning by other antipsychotics and neuroleptics, accidental (unintentional), initial encounter: Secondary | ICD-10-CM | POA: Diagnosis present

## 2022-01-09 DIAGNOSIS — T510X1A Toxic effect of ethanol, accidental (unintentional), initial encounter: Secondary | ICD-10-CM | POA: Diagnosis present

## 2022-01-09 DIAGNOSIS — G4700 Insomnia, unspecified: Secondary | ICD-10-CM | POA: Diagnosis present

## 2022-01-09 DIAGNOSIS — J9602 Acute respiratory failure with hypercapnia: Secondary | ICD-10-CM | POA: Diagnosis present

## 2022-01-09 DIAGNOSIS — I700 Atherosclerosis of aorta: Secondary | ICD-10-CM | POA: Diagnosis present

## 2022-01-09 DIAGNOSIS — E876 Hypokalemia: Secondary | ICD-10-CM | POA: Diagnosis present

## 2022-01-09 DIAGNOSIS — G928 Other toxic encephalopathy: Secondary | ICD-10-CM | POA: Diagnosis present

## 2022-01-09 DIAGNOSIS — R1013 Epigastric pain: Secondary | ICD-10-CM | POA: Diagnosis present

## 2022-01-09 DIAGNOSIS — F10229 Alcohol dependence with intoxication, unspecified: Secondary | ICD-10-CM | POA: Diagnosis present

## 2022-01-09 DIAGNOSIS — I10 Essential (primary) hypertension: Secondary | ICD-10-CM | POA: Diagnosis present

## 2022-01-09 DIAGNOSIS — F329 Major depressive disorder, single episode, unspecified: Secondary | ICD-10-CM

## 2022-01-09 DIAGNOSIS — F1014 Alcohol abuse with alcohol-induced mood disorder: Secondary | ICD-10-CM | POA: Diagnosis not present

## 2022-01-09 DIAGNOSIS — K7030 Alcoholic cirrhosis of liver without ascites: Secondary | ICD-10-CM | POA: Diagnosis present

## 2022-01-09 DIAGNOSIS — F1024 Alcohol dependence with alcohol-induced mood disorder: Secondary | ICD-10-CM | POA: Diagnosis present

## 2022-01-09 DIAGNOSIS — Y908 Blood alcohol level of 240 mg/100 ml or more: Secondary | ICD-10-CM | POA: Diagnosis present

## 2022-01-09 DIAGNOSIS — Z20822 Contact with and (suspected) exposure to covid-19: Secondary | ICD-10-CM | POA: Diagnosis present

## 2022-01-09 DIAGNOSIS — F419 Anxiety disorder, unspecified: Secondary | ICD-10-CM | POA: Diagnosis present

## 2022-01-09 LAB — CBC
HCT: 39.3 % (ref 36.0–46.0)
Hemoglobin: 14.1 g/dL (ref 12.0–15.0)
MCH: 33.2 pg (ref 26.0–34.0)
MCHC: 35.9 g/dL (ref 30.0–36.0)
MCV: 92.5 fL (ref 80.0–100.0)
Platelets: 181 10*3/uL (ref 150–400)
RBC: 4.25 MIL/uL (ref 3.87–5.11)
RDW: 12.8 % (ref 11.5–15.5)
WBC: 13.1 10*3/uL — ABNORMAL HIGH (ref 4.0–10.5)
nRBC: 0 % (ref 0.0–0.2)

## 2022-01-09 LAB — MAGNESIUM: Magnesium: 1.8 mg/dL (ref 1.7–2.4)

## 2022-01-09 LAB — BRAIN NATRIURETIC PEPTIDE: B Natriuretic Peptide: 113.2 pg/mL — ABNORMAL HIGH (ref 0.0–100.0)

## 2022-01-09 LAB — HIV ANTIBODY (ROUTINE TESTING W REFLEX): HIV Screen 4th Generation wRfx: NONREACTIVE

## 2022-01-09 LAB — GLUCOSE, CAPILLARY
Glucose-Capillary: 131 mg/dL — ABNORMAL HIGH (ref 70–99)
Glucose-Capillary: 120 mg/dL — ABNORMAL HIGH (ref 70–99)
Glucose-Capillary: 89 mg/dL (ref 70–99)
Glucose-Capillary: 109 mg/dL — ABNORMAL HIGH (ref 70–99)
Glucose-Capillary: 108 mg/dL — ABNORMAL HIGH (ref 70–99)
Glucose-Capillary: 144 mg/dL — ABNORMAL HIGH (ref 70–99)

## 2022-01-09 LAB — BASIC METABOLIC PANEL
Anion gap: 16 — ABNORMAL HIGH (ref 5–15)
BUN: 7 mg/dL (ref 6–20)
CO2: 20 mmol/L — ABNORMAL LOW (ref 22–32)
Calcium: 8.4 mg/dL — ABNORMAL LOW (ref 8.9–10.3)
Chloride: 107 mmol/L (ref 98–111)
Creatinine, Ser: 0.7 mg/dL (ref 0.44–1.00)
GFR, Estimated: >60 mL/min (ref >60)
Glucose, Bld: 96 mg/dL (ref 70–99)
Potassium: 3.2 mmol/L — ABNORMAL LOW (ref 3.5–5.1)
Sodium: 143 mmol/L (ref 135–145)

## 2022-01-09 LAB — MRSA NEXT GEN BY PCR, NASAL: MRSA by PCR Next Gen: NOT DETECTED

## 2022-01-09 LAB — RESP PANEL BY RT-PCR (FLU A&B, COVID) ARPGX2
Influenza A by PCR: NEGATIVE
Influenza B by PCR: NEGATIVE
SARS Coronavirus 2 by RT PCR: NEGATIVE

## 2022-01-09 LAB — LACTIC ACID, PLASMA
Lactic Acid, Venous: 4.4 mmol/L (ref 0.5–1.9)
Lactic Acid, Venous: 4.6 mmol/L (ref 0.5–1.9)
Lactic Acid, Venous: 3.3 mmol/L (ref 0.5–1.9)

## 2022-01-09 LAB — TROPONIN I (HIGH SENSITIVITY)
Troponin I (High Sensitivity): 5 ng/L (ref <18)
Troponin I (High Sensitivity): 7 ng/L (ref <18)
Troponin I (High Sensitivity): 6 ng/L (ref <18)

## 2022-01-09 LAB — PHOSPHORUS: Phosphorus: 4.2 mg/dL (ref 2.5–4.6)

## 2022-01-09 LAB — TRIGLYCERIDES: Triglycerides: 229 mg/dL — ABNORMAL HIGH (ref <150)

## 2022-01-09 MED ORDER — KETOROLAC TROMETHAMINE 15 MG/ML IJ SOLN
15.0000 mg | Freq: Once | INTRAMUSCULAR | Status: AC
  Administered 2022-01-09: 15 mg via INTRAVENOUS
  Filled 2022-01-09: qty 1

## 2022-01-09 MED ORDER — POTASSIUM CHLORIDE 20 MEQ PO PACK
20.0000 meq | PACK | ORAL | Status: AC
  Administered 2022-01-09: 20 meq via ORAL
  Filled 2022-01-09: qty 1

## 2022-01-09 MED ORDER — FENTANYL CITRATE (PF) 100 MCG/2ML IJ SOLN: 50.0000 ug | INTRAMUSCULAR | Status: DC | PRN

## 2022-01-09 MED ORDER — POTASSIUM CHLORIDE 10 MEQ/100ML IV SOLN
10.0000 meq | INTRAVENOUS | Status: AC
  Administered 2022-01-09 (×4): 10 meq via INTRAVENOUS
  Filled 2022-01-09 (×3): qty 100

## 2022-01-09 MED ORDER — THIAMINE HCL 100 MG PO TABS
100.0000 mg | ORAL_TABLET | Freq: Every day | ORAL | Status: DC
  Administered 2022-01-09 – 2022-01-13 (×5): 100 mg via ORAL
  Filled 2022-01-09 (×5): qty 1

## 2022-01-09 MED ORDER — PROPRANOLOL HCL 20 MG PO TABS
20.0000 mg | ORAL_TABLET | Freq: Two times a day (BID) | ORAL | Status: DC
  Administered 2022-01-09 – 2022-01-13 (×8): 20 mg via ORAL
  Filled 2022-01-09 (×8): qty 1

## 2022-01-09 MED ORDER — LACTATED RINGERS IV SOLN: INTRAVENOUS | Status: DC

## 2022-01-09 MED ORDER — POTASSIUM CHLORIDE 20 MEQ PO PACK
60.0000 meq | PACK | Freq: Once | ORAL | Status: AC
  Administered 2022-01-09: 60 meq
  Filled 2022-01-09: qty 3

## 2022-01-09 MED ORDER — FOLIC ACID 1 MG PO TABS
1.0000 mg | ORAL_TABLET | Freq: Every day | ORAL | Status: DC
  Administered 2022-01-09 – 2022-01-13 (×5): 1 mg via ORAL
  Filled 2022-01-09 (×5): qty 1

## 2022-01-09 MED ORDER — PANTOPRAZOLE 2 MG/ML SUSPENSION: 40.0000 mg | Freq: Every day | ORAL | Status: DC

## 2022-01-09 MED ORDER — DULOXETINE HCL 30 MG PO CPEP
30.0000 mg | ORAL_CAPSULE | Freq: Every day | ORAL | Status: DC
  Administered 2022-01-09 – 2022-01-13 (×5): 30 mg via ORAL
  Filled 2022-01-09 (×5): qty 1

## 2022-01-09 MED ORDER — LACTULOSE 10 GM/15ML PO SOLN: 20.0000 g | Freq: Two times a day (BID) | ORAL | Status: DC

## 2022-01-09 MED ORDER — DOCUSATE SODIUM 100 MG PO CAPS: 100.0000 mg | ORAL_CAPSULE | Freq: Two times a day (BID) | ORAL | Status: DC | PRN

## 2022-01-09 MED ORDER — DOCUSATE SODIUM 50 MG/5ML PO LIQD
100.0000 mg | Freq: Two times a day (BID) | ORAL | Status: DC
  Administered 2022-01-09: 100 mg
  Filled 2022-01-09: qty 10

## 2022-01-09 MED ORDER — POTASSIUM CHLORIDE 20 MEQ PO PACK
20.0000 meq | PACK | ORAL | Status: DC
  Administered 2022-01-09: 20 meq
  Filled 2022-01-09: qty 1

## 2022-01-09 MED ORDER — POLYETHYLENE GLYCOL 3350 17 G PO PACK: 17.0000 g | PACK | Freq: Every day | ORAL | Status: DC | PRN

## 2022-01-09 MED ORDER — ORAL CARE MOUTH RINSE: 15.0000 mL | OROMUCOSAL | Status: DC | PRN

## 2022-01-09 MED ORDER — PANTOPRAZOLE SODIUM 40 MG IV SOLR
40.0000 mg | Freq: Every day | INTRAVENOUS | Status: DC
  Administered 2022-01-09: 40 mg via INTRAVENOUS
  Filled 2022-01-09: qty 10

## 2022-01-09 MED ORDER — POLYETHYLENE GLYCOL 3350 17 G PO PACK: 17.0000 g | PACK | Freq: Every day | ORAL | Status: DC

## 2022-01-09 MED ORDER — ADULT MULTIVITAMIN W/MINERALS CH
1.0000 | ORAL_TABLET | Freq: Every day | ORAL | Status: DC
Start: 2022-01-09 — End: 2022-01-09

## 2022-01-09 MED ORDER — IPRATROPIUM-ALBUTEROL 0.5-2.5 (3) MG/3ML IN SOLN
3.0000 mL | RESPIRATORY_TRACT | Status: DC | PRN
  Administered 2022-01-09: 3 mL via RESPIRATORY_TRACT

## 2022-01-09 MED ORDER — ORAL CARE MOUTH RINSE
15.0000 mL | OROMUCOSAL | Status: DC
Start: 2022-01-09 — End: 2022-01-09
  Administered 2022-01-09 (×3): 15 mL via OROMUCOSAL

## 2022-01-09 MED ORDER — IPRATROPIUM-ALBUTEROL 0.5-2.5 (3) MG/3ML IN SOLN
3.0000 mL | Freq: Four times a day (QID) | RESPIRATORY_TRACT | Status: DC
  Filled 2022-01-09: qty 3

## 2022-01-09 MED ORDER — FENTANYL CITRATE PF 50 MCG/ML IJ SOSY: 50.0000 ug | PREFILLED_SYRINGE | INTRAMUSCULAR | Status: DC | PRN

## 2022-01-09 MED ORDER — LACTULOSE 10 GM/15ML PO SOLN
20.0000 g | Freq: Every day | ORAL | Status: DC
Start: 2022-01-10 — End: 2022-01-13
  Administered 2022-01-10 – 2022-01-12 (×3): 20 g via ORAL
  Filled 2022-01-09 (×4): qty 30

## 2022-01-09 MED ORDER — FOLIC ACID 1 MG PO TABS: 1.0000 mg | ORAL_TABLET | Freq: Every day | ORAL | Status: DC

## 2022-01-09 MED ORDER — THIAMINE HCL 100 MG PO TABS
100.0000 mg | ORAL_TABLET | Freq: Every day | ORAL | Status: DC
Start: 2022-01-09 — End: 2022-01-09

## 2022-01-09 MED ORDER — HEPARIN SODIUM (PORCINE) 5000 UNIT/ML IJ SOLN
5000.0000 [IU] | Freq: Three times a day (TID) | INTRAMUSCULAR | Status: DC
  Administered 2022-01-09 – 2022-01-13 (×13): 5000 [IU] via SUBCUTANEOUS
  Filled 2022-01-09 (×13): qty 1

## 2022-01-09 MED ORDER — CHLORHEXIDINE GLUCONATE CLOTH 2 % EX PADS
6.0000 | MEDICATED_PAD | Freq: Every day | CUTANEOUS | Status: DC
  Administered 2022-01-09 – 2022-01-13 (×4): 6 via TOPICAL

## 2022-01-09 MED ORDER — ADULT MULTIVITAMIN W/MINERALS CH
1.0000 | ORAL_TABLET | Freq: Every day | ORAL | Status: DC
  Administered 2022-01-09 – 2022-01-13 (×5): 1 via ORAL
  Filled 2022-01-09 (×5): qty 1

## 2022-01-09 MED ORDER — PROCHLORPERAZINE EDISYLATE 10 MG/2ML IJ SOLN
5.0000 mg | Freq: Once | INTRAMUSCULAR | Status: DC | PRN
Start: 2022-01-09 — End: 2022-01-09

## 2022-01-09 MED ORDER — LORAZEPAM 2 MG/ML IJ SOLN
1.0000 mg | INTRAMUSCULAR | Status: AC | PRN
  Administered 2022-01-10: 1 mg via INTRAVENOUS
  Administered 2022-01-12: 3 mg via INTRAVENOUS
  Administered 2022-01-12: 2 mg via INTRAVENOUS
  Filled 2022-01-09: qty 2
  Filled 2022-01-09: qty 1
  Filled 2022-01-09: qty 2

## 2022-01-09 MED ORDER — LORAZEPAM 1 MG PO TABS
1.0000 mg | ORAL_TABLET | ORAL | Status: AC | PRN
  Administered 2022-01-09 – 2022-01-10 (×3): 1 mg via ORAL
  Administered 2022-01-10: 2 mg via ORAL
  Administered 2022-01-10: 1 mg via ORAL
  Administered 2022-01-11: 2 mg via ORAL
  Administered 2022-01-11: 3 mg via ORAL
  Administered 2022-01-11: 2 mg via ORAL
  Administered 2022-01-11: 1 mg via ORAL
  Filled 2022-01-09: qty 1
  Filled 2022-01-09 (×4): qty 2
  Filled 2022-01-09 (×3): qty 1
  Filled 2022-01-09: qty 4
  Filled 2022-01-09: qty 1

## 2022-01-09 MED ORDER — TRIMETHOBENZAMIDE HCL 100 MG/ML IM SOLN
200.0000 mg | Freq: Once | INTRAMUSCULAR | Status: DC
  Filled 2022-01-09: qty 2

## 2022-01-09 MED ORDER — MAGNESIUM SULFATE IN D5W 1-5 GM/100ML-% IV SOLN
1.0000 g | Freq: Once | INTRAVENOUS | Status: AC
  Administered 2022-01-09: 1 g via INTRAVENOUS
  Filled 2022-01-09: qty 100

## 2022-01-09 MED ORDER — ALBUMIN HUMAN 5 % IV SOLN
12.5000 g | Freq: Once | INTRAVENOUS | Status: AC
Start: 2022-01-09 — End: 2022-01-09
  Administered 2022-01-09: 12.5 g via INTRAVENOUS
  Filled 2022-01-09: qty 250

## 2022-01-09 NOTE — Progress Notes (Signed)
Pt transported by RT x2 and RN from ED [facility] to [phone] w/o complications

## 2022-01-09 NOTE — Progress Notes (Signed)
eLink Physician-Brief Progress Note Patient Name: Amy Villa DOB: 08-21-1965 MRN: [id number]   Date of Service  01/09/2022  HPI/Events of Note  C-Spine CT without evidence of fracture or dislocation.  eICU Interventions  C-Collar  order discontinued.        Mellissa Conley U Melora Menon 01/09/2022, 2:10 AM
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
## 2022-01-10 DIAGNOSIS — E8729 Other acidosis: Secondary | ICD-10-CM

## 2022-01-10 DIAGNOSIS — R092 Respiratory arrest: Secondary | ICD-10-CM | POA: Diagnosis not present

## 2022-01-10 DIAGNOSIS — G9341 Metabolic encephalopathy: Secondary | ICD-10-CM | POA: Diagnosis not present

## 2022-01-10 LAB — BASIC METABOLIC PANEL
Anion gap: 10 (ref 5–15)
BUN: 8 mg/dL (ref 6–20)
CO2: 23 mmol/L (ref 22–32)
Calcium: 9 mg/dL (ref 8.9–10.3)
Chloride: 103 mmol/L (ref 98–111)
Creatinine, Ser: 0.69 mg/dL (ref 0.44–1.00)
GFR, Estimated: >60 mL/min (ref >60)
Glucose, Bld: 96 mg/dL (ref 70–99)
Potassium: 3.8 mmol/L (ref 3.5–5.1)
Sodium: 136 mmol/L (ref 135–145)

## 2022-01-10 LAB — CBC
HCT: 35.7 % — ABNORMAL LOW (ref 36.0–46.0)
Hemoglobin: 12.6 g/dL (ref 12.0–15.0)
MCH: 33.1 pg (ref 26.0–34.0)
MCHC: 35.3 g/dL (ref 30.0–36.0)
MCV: 93.7 fL (ref 80.0–100.0)
Platelets: 131 10*3/uL — ABNORMAL LOW (ref 150–400)
RBC: 3.81 MIL/uL — ABNORMAL LOW (ref 3.87–5.11)
RDW: 12.7 % (ref 11.5–15.5)
WBC: 7.5 10*3/uL (ref 4.0–10.5)
nRBC: 0 % (ref 0.0–0.2)

## 2022-01-10 LAB — MAGNESIUM: Magnesium: 1.9 mg/dL (ref 1.7–2.4)

## 2022-01-10 MED ORDER — HYDRALAZINE HCL 20 MG/ML IJ SOLN
10.0000 mg | INTRAMUSCULAR | Status: AC | PRN
  Administered 2022-01-10: 10 mg via INTRAVENOUS
  Filled 2022-01-10: qty 1

## 2022-01-10 MED ORDER — PANTOPRAZOLE SODIUM 40 MG PO TBEC
40.0000 mg | DELAYED_RELEASE_TABLET | Freq: Every day | ORAL | Status: DC
  Administered 2022-01-10 – 2022-01-12 (×3): 40 mg via ORAL
  Filled 2022-01-10 (×3): qty 1

## 2022-01-10 MED ORDER — MELATONIN 5 MG PO TABS
5.0000 mg | ORAL_TABLET | Freq: Every evening | ORAL | Status: DC | PRN
Start: 2022-01-10 — End: 2022-01-13
  Administered 2022-01-10 – 2022-01-12 (×3): 5 mg via ORAL
  Filled 2022-01-10 (×3): qty 1

## 2022-01-10 MED ORDER — LIDOCAINE 5 % EX PTCH
1.0000 | MEDICATED_PATCH | CUTANEOUS | Status: DC
  Administered 2022-01-10 – 2022-01-12 (×3): 1 via TRANSDERMAL
  Filled 2022-01-10 (×4): qty 1

## 2022-01-10 MED ORDER — MAGNESIUM SULFATE 2 GM/50ML IV SOLN
2.0000 g | Freq: Once | INTRAVENOUS | Status: AC
  Administered 2022-01-10: 2 g via INTRAVENOUS
  Filled 2022-01-10: qty 50

## 2022-01-10 MED ORDER — ALUM & MAG HYDROXIDE-SIMETH 200-200-20 MG/5ML PO SUSP
30.0000 mL | Freq: Four times a day (QID) | ORAL | Status: DC | PRN
Start: 2022-01-10 — End: 2022-01-13

## 2022-01-10 NOTE — Progress Notes (Addendum)
Pt arrived to room 5N30 via wheelchair from 25M ICU. Received report from [place], Charity fundraiser. See reassessment. Sitter remains at bedside.

## 2022-01-11 DIAGNOSIS — G9341 Metabolic encephalopathy: Secondary | ICD-10-CM | POA: Diagnosis not present

## 2022-01-11 DIAGNOSIS — R092 Respiratory arrest: Secondary | ICD-10-CM | POA: Diagnosis not present

## 2022-01-11 DIAGNOSIS — F1014 Alcohol abuse with alcohol-induced mood disorder: Secondary | ICD-10-CM

## 2022-01-11 LAB — BASIC METABOLIC PANEL
Anion gap: 7 (ref 5–15)
BUN: 6 mg/dL (ref 6–20)
CO2: 23 mmol/L (ref 22–32)
Calcium: 9 mg/dL (ref 8.9–10.3)
Chloride: 104 mmol/L (ref 98–111)
Creatinine, Ser: 0.61 mg/dL (ref 0.44–1.00)
GFR, Estimated: >60 mL/min (ref >60)
Glucose, Bld: 108 mg/dL — ABNORMAL HIGH (ref 70–99)
Potassium: 3.9 mmol/L (ref 3.5–5.1)
Sodium: 134 mmol/L — ABNORMAL LOW (ref 135–145)

## 2022-01-11 LAB — MAGNESIUM: Magnesium: 2.2 mg/dL (ref 1.7–2.4)

## 2022-01-11 MED ORDER — ONDANSETRON HCL 4 MG/2ML IJ SOLN
4.0000 mg | Freq: Four times a day (QID) | INTRAMUSCULAR | Status: DC | PRN
  Administered 2022-01-12: 4 mg via INTRAVENOUS
  Filled 2022-01-11: qty 2

## 2022-01-11 MED ORDER — ONDANSETRON HCL 4 MG/2ML IJ SOLN
INTRAMUSCULAR | Status: AC
  Administered 2022-01-11: 4 mg via INTRAVENOUS
  Filled 2022-01-11: qty 2

## 2022-01-12 DIAGNOSIS — G9341 Metabolic encephalopathy: Secondary | ICD-10-CM | POA: Diagnosis not present

## 2022-01-12 DIAGNOSIS — R092 Respiratory arrest: Secondary | ICD-10-CM | POA: Diagnosis not present

## 2022-01-12 MED ORDER — CHLORDIAZEPOXIDE HCL 25 MG PO CAPS
25.0000 mg | ORAL_CAPSULE | Freq: Two times a day (BID) | ORAL | Status: DC
  Administered 2022-01-12 – 2022-01-13 (×2): 25 mg via ORAL
  Filled 2022-01-12 (×2): qty 1

## 2022-01-12 MED ORDER — CHLORDIAZEPOXIDE HCL 25 MG PO CAPS: 25.0000 mg | ORAL_CAPSULE | Freq: Once | ORAL | Status: DC

## 2022-01-12 MED ORDER — LAMOTRIGINE 25 MG PO TABS
25.0000 mg | ORAL_TABLET | Freq: Every day | ORAL | Status: DC
  Administered 2022-01-12 – 2022-01-13 (×2): 25 mg via ORAL
  Filled 2022-01-12 (×2): qty 1

## 2022-01-12 MED ORDER — QUETIAPINE FUMARATE 25 MG PO TABS
50.0000 mg | ORAL_TABLET | Freq: Every evening | ORAL | Status: DC | PRN
  Administered 2022-01-12: 50 mg via ORAL
  Filled 2022-01-12: qty 2

## 2022-01-12 NOTE — Social Work (Addendum)
4:00 Amy Villa has extended bed offer for tomorrow at 9 AM.  Admitting MD is Dr. Forrestine Him Pt will be in [facility] Building [address]. Call to report is [phone]. Pt does not need to be under IVC as long as she agrees to treatment. TOC will follow for DC needs.   CSW advised pt will need inpatient Psych placement at DC. Patient is currently agreeable and NOT under IVC. [facility] currently with no beds. Pt faxed out to several facilities, no placement found today. TOC will continue to follow for DC needs. [facility] [place]

## 2022-01-12 NOTE — Progress Notes (Signed)
PROGRESS NOTE        PATIENT DETAILS Name: Amy Villa Age: 56 y.o. Sex: female Date of Birth: [birth year] Admit Date: 01/08/2022 Admitting Physician Provider Default, MD BJY:[id number], No Pcp Per  Brief Summary: Patient is a 56 y.o.  female with history of alcoholic cirrhosis, HTN, COPD, anxiety/depression-who was found unresponsive (EtOH intoxication with superimposed polypharmacy) by her husband-intubated in the ED-admitted by PCCM to the ICU-upon stability-she was transferred to the hospitalist service.  See below for further details.  Significant events: 6/22>>Presented after being found down by husband with unknown downtime. Required bag mask assist per EMS.  Intubated in the emergency room.  Ethanol level 291! 6/23>> extubated 6/24>> transfer to [facility].  Significant studies: 6/22>> CXR: No acute abnormality 6/22>> CT head: No acute abnormality 6/22>> CT C-spine: No acute abnormality 6/22>> vitamin B1 level: Pending  Significant microbiology data: 6/22>> COVID/influenza PCR: Negative  Procedures: 6/22>> 6/23: ETT  Consults: PCCM, psychiatry  Subjective: Complains of insomnia-anxiety.  No other major issues overnight-medically stable for [facility] admission if bed available.  Objective: Vitals: Blood pressure (!) 136/109, pulse 61, temperature 97.9 F (36.6 C), resp. rate 16, height 5\' 3"  (1.6 m), weight 58.8 kg, SpO2 100 %.   Exam: Gen Exam:Alert awake-not in any distress HEENT:atraumatic, normocephalic Chest: B/L clear to auscultation anteriorly CVS:S1S2 regular Abdomen:soft non tender, non distended Extremities:no edema Neurology: Non focal Skin: no rash    Pertinent Labs/Radiology:    Latest Ref Rng & Units 01/10/2022   12:47 AM 01/09/2022    4:49 AM 01/08/2022   11:05 PM  CBC  WBC 4.0 - 10.5 K/uL 7.5  13.1    Hemoglobin 12.0 - 15.0 g/dL 13.0  86.5  78.4   Hematocrit 36.0 - 46.0 % 35.7  39.3  43.0   Platelets 150 - 400 K/uL 131   181      Lab Results  Component Value Date   NA 134 (L) 01/11/2022   K 3.9 01/11/2022   CL 104 01/11/2022   CO2 23 01/11/2022      Assessment/Plan: Acute hypoxic/hypercapnic respiratory failure: Due to toxic encephalopathy-extubated on 6/23-currently on room air.  Acute toxic metabolic encephalopathy: Thought to be due to alcohol intoxication with superimposed polypharmacy (Zyprexa/Seroquel/Belsomra).  This has resolved-she is completely awake and alert.  EtOH cirrhosis: Supportive care-seems to be well compensated-continue lactulose/propanolol  EtOH use: Counseled-no signs of withdrawal-on Ativan per CIWA protocol.  Major depressive disorder/substance abuse disorder: Evaluated by psychiatry on 6/23-recommendations are for inpatient psychiatric treatment.  Sitter in place.  Continue Cymbalta.  Per psychiatry-continue to hold other home psychotropic medications.  This morning-she complains of ongoing anxiety/insomnia-we will defer initiation of other agents to psychiatry team.   Epigastric pain: Better-continue PPI/Maalox/as needed antiemetics.   COPD: Stable-continue bronchodilators  BMI: Estimated body mass index is 22.96 kg/m as calculated from the following:   Height as of this encounter: 5\' 3"  (1.6 m).   Weight as of this encounter: 58.8 kg.   Code status:   Code Status: Full Code   DVT Prophylaxis: heparin injection 5,000 Units Start: 01/09/22 0600 SCDs Start: 01/09/22 0000   Family Communication: Spouse-Bill-[phone]-updated over the phone on 6/25  Disposition Plan: Status is: Inpatient Remains inpatient appropriate because: Awaiting inpatient psychiatry bed.  Medically stable for discharge.   Planned Discharge Destination:Inpatient Psych   Diet: Diet Order  Diet regular Room service appropriate? Yes; Fluid consistency: Thin  Diet effective now                     Antimicrobial agents: Anti-infectives (From admission, onward)     None        MEDICATIONS: Scheduled Meds:  Chlorhexidine Gluconate Cloth  6 each Topical Q0600   DULoxetine  30 mg Oral Daily   folic acid  1 mg Oral Daily   heparin  5,000 Units Subcutaneous Q8H   lactulose  20 g Oral Daily   lidocaine  1 patch Transdermal Q24H   multivitamin with minerals  1 tablet Oral Daily   pantoprazole  40 mg Oral Q1200   propranolol  20 mg Oral BID   thiamine  100 mg Oral Daily   Continuous Infusions:   PRN Meds:.alum & mag hydroxide-simeth, ipratropium-albuterol, LORazepam **OR** LORazepam, melatonin, ondansetron (ZOFRAN) IV, mouth rinse, polyethylene glycol   I have personally reviewed following labs and imaging studies  LABORATORY DATA: CBC: Recent Labs  Lab 01/08/22 2252 01/08/22 2305 01/09/22 0449 01/10/22 0047  WBC 8.2  --  13.1* 7.5  NEUTROABS 6.5  --   --   --   HGB 14.5 14.6 14.1 12.6  HCT 40.5 43.0 39.3 35.7*  MCV 94.6  --  92.5 93.7  PLT 158  --  181 131*     Basic Metabolic Panel: Recent Labs  Lab 01/08/22 2252 01/08/22 2305 01/09/22 0449 01/10/22 0047 01/11/22 0343  NA 139 140 143 136 134*  K 3.3* 2.8* 3.2* 3.8 3.9  CL 100  --  107 103 104  CO2 25  --  20* 23 23  GLUCOSE 129*  --  96 96 108*  BUN 6  --  7 8 6   CREATININE 0.82  --  0.70 0.69 0.61  CALCIUM 8.7*  --  8.4* 9.0 9.0  MG  --   --  1.8 1.9 2.2  PHOS  --   --  4.2  --   --      GFR: Estimated Creatinine Clearance: 65.7 mL/min (by C-G formula based on SCr of 0.61 mg/dL).  Liver Function Tests: Recent Labs  Lab 01/08/22 2252  AST 60*  ALT 42  ALKPHOS 140*  BILITOT 0.5  PROT 7.5  ALBUMIN 4.3    Recent Labs  Lab 01/08/22 2252  LIPASE 40    Recent Labs  Lab 01/08/22 2233  AMMONIA 50*     Coagulation Profile: No results for input(s): "INR", "PROTIME" in the last 168 hours.  Cardiac Enzymes: Recent Labs  Lab 01/08/22 2252  CKTOTAL 195     BNP (last 3 results) No results for input(s): "PROBNP" in the last 8760 hours.  Lipid  Profile: No results for input(s): "CHOL", "HDL", "LDLCALC", "TRIG", "CHOLHDL", "LDLDIRECT" in the last 72 hours.   Thyroid Function Tests: No results for input(s): "TSH", "T4TOTAL", "FREET4", "T3FREE", "THYROIDAB" in the last 72 hours.  Anemia Panel: No results for input(s): "VITAMINB12", "FOLATE", "FERRITIN", "TIBC", "IRON", "RETICCTPCT" in the last 72 hours.  Urine analysis:    Component Value Date/Time   COLORURINE YELLOW 01/08/2022 2240   APPEARANCEUR CLEAR 01/08/2022 2240   LABSPEC 1.004 (L) 01/08/2022 2240   PHURINE 8.0 01/08/2022 2240   GLUCOSEU NEGATIVE 01/08/2022 2240   GLUCOSEU NEGATIVE 07/22/2016 1204   HGBUR NEGATIVE 01/08/2022 2240   BILIRUBINUR NEGATIVE 01/08/2022 2240   KETONESUR NEGATIVE 01/08/2022 2240   PROTEINUR 30 (A) 01/08/2022 2240   UROBILINOGEN 0.2  07/22/2016 1204   NITRITE NEGATIVE 01/08/2022 2240   LEUKOCYTESUR NEGATIVE 01/08/2022 2240    Sepsis Labs: Lactic Acid, Venous    Component Value Date/Time   LATICACIDVEN 3.3 (HH) 01/09/2022 0747    MICROBIOLOGY: Recent Results (from the past 240 hour(s))  Resp Panel by RT-PCR (Flu A&B, Covid) Anterior Nasal Swab     Status: None   Collection Time: 01/08/22 11:03 PM   Specimen: Anterior Nasal Swab  Result Value Ref Range Status   SARS Coronavirus 2 by RT PCR NEGATIVE NEGATIVE Final    Comment: (NOTE) SARS-CoV-2 target nucleic acids are NOT DETECTED.  The SARS-CoV-2 RNA is generally detectable in upper respiratory specimens during the acute phase of infection. The lowest concentration of SARS-CoV-2 viral copies this assay can detect is 138 copies/mL. A negative result does not preclude SARS-Cov-2 infection and should not be used as the sole basis for treatment or other patient management decisions. A negative result may occur with  improper specimen collection/handling, submission of specimen other than nasopharyngeal swab, presence of viral mutation(s) within the areas targeted by this assay, and  inadequate number of viral copies(<138 copies/mL). A negative result must be combined with clinical observations, patient history, and epidemiological information. The expected result is Negative.  Fact Sheet for Patients:  BloggerCourse.com  Fact Sheet for Healthcare Providers:  SeriousBroker.it  This test is no t yet approved or cleared by the [place] FDA and  has been authorized for detection and/or diagnosis of SARS-CoV-2 by FDA under an Emergency Use Authorization (EUA). This EUA will remain  in effect (meaning this test can be used) for the duration of the COVID-19 declaration under Section 564(b)(1) of the Act, 21 U.S.C.section 360bbb-3(b)(1), unless the authorization is terminated  or revoked sooner.       Influenza A by PCR NEGATIVE NEGATIVE Final   Influenza B by PCR NEGATIVE NEGATIVE Final    Comment: (NOTE) The Xpert Xpress SARS-CoV-2/FLU/RSV plus assay is intended as an aid in the diagnosis of influenza from Nasopharyngeal swab specimens and should not be used as a sole basis for treatment. Nasal washings and aspirates are unacceptable for Xpert Xpress SARS-CoV-2/FLU/RSV testing.  Fact Sheet for Patients: BloggerCourse.com  Fact Sheet for Healthcare Providers: SeriousBroker.it  This test is not yet approved or cleared by the [place] FDA and has been authorized for detection and/or diagnosis of SARS-CoV-2 by FDA under an Emergency Use Authorization (EUA). This EUA will remain in effect (meaning this test can be used) for the duration of the COVID-19 declaration under Section 564(b)(1) of the Act, 21 U.S.C. section 360bbb-3(b)(1), unless the authorization is terminated or revoked.  Performed at [facility], [address], [place], [place] [address]   MRSA Next Gen by PCR, Nasal     Status: None   Collection Time: 01/09/22  1:19 AM    Specimen: Nasal Mucosa; Nasal Swab  Result Value Ref Range Status   MRSA by PCR Next Gen NOT DETECTED NOT DETECTED Final    Comment: (NOTE) The GeneXpert MRSA Assay (FDA approved for NASAL specimens only), is one component of a comprehensive MRSA colonization surveillance program. It is not intended to diagnose MRSA infection nor to guide or monitor treatment for MRSA infections. Test performance is not FDA approved in patients less than 79 years old. Performed at [facility], [address] [place], [place] [address]     RADIOLOGY STUDIES/RESULTS: No results found.   LOS: 3 days   [employer],  MD  [facility]    To contact the attending provider between 7A-7P or the covering provider during after hours 7P-7A, please log into the web site [email or web address] and access using universal [place] password for that web site. If you do not have the password, please call the hospital operator.  01/12/2022, 9:28 AM

## 2022-01-13 DIAGNOSIS — R092 Respiratory arrest: Secondary | ICD-10-CM | POA: Diagnosis not present

## 2022-01-13 DIAGNOSIS — F1014 Alcohol abuse with alcohol-induced mood disorder: Secondary | ICD-10-CM

## 2022-01-13 LAB — VITAMIN B1: Vitamin B1 (Thiamine): 154.8 nmol/L (ref 66.5–200.0)

## 2022-01-13 MED ORDER — CHLORDIAZEPOXIDE HCL 25 MG PO CAPS
ORAL_CAPSULE | ORAL | 0 refills | Status: DC
Start: 2022-01-13 — End: 2022-03-06

## 2022-01-13 MED ORDER — LAMOTRIGINE 25 MG PO TABS: 25.0000 mg | ORAL_TABLET | Freq: Every day | ORAL | Status: DC

## 2022-01-13 MED ORDER — FOLIC ACID 1 MG PO TABS: 1.0000 mg | ORAL_TABLET | Freq: Every day | ORAL | Status: DC

## 2022-01-13 MED ORDER — ADULT MULTIVITAMIN W/MINERALS CH: 1.0000 | ORAL_TABLET | Freq: Every day | ORAL | Status: DC

## 2022-01-13 MED ORDER — QUETIAPINE FUMARATE 50 MG PO TABS: 50.0000 mg | ORAL_TABLET | Freq: Every evening | ORAL | Status: DC | PRN

## 2022-01-13 NOTE — TOC Transition Note (Signed)
Transition of Care [facility]) - CM/SW Discharge Note   Patient Details  Name: Amy Villa MRN: [id number] Date of Birth: 1965-11-08  [facility] CM/SW Contact:  Carley Hammed, LCSWA Phone Number: 01/13/2022, 9:45 AM   Clinical Narrative:    Pt will be transported to [facility] via [facility]. Admitting MD is Dr. Forrestine Him Pt will be in [facility] Building [address]. Call to report is [phone] Pt declined CSW notifying family.  Final next level of care: Psychiatric Hospital Barriers to Discharge: Barriers Resolved   Patient Goals and CMS Choice        Discharge Placement              Patient chooses bed at:  [facility]) Patient to be transferred to facility by: Safe Transport Name of family member notified: Pt declined Patient and family notified of of transfer: 01/13/22  Discharge Plan and Services                                     Social Determinants of Health (SDOH) Interventions     Readmission Risk Interventions    11/14/2019   11:36 AM  Readmission Risk Prevention Plan  Transportation Screening Complete  PCP or Specialist Appt within 3-5 Days Complete  HRI or Home Care Consult Complete  Social Work Consult for Recovery Care Planning/Counseling Complete  Palliative Care Screening Not Applicable  Medication Review [employer]) Complete

## 2022-02-17 ENCOUNTER — Telehealth (HOSPITAL_COMMUNITY): Payer: Self-pay | Admitting: Psychiatry

## 2022-02-17 ENCOUNTER — Ambulatory Visit (HOSPITAL_COMMUNITY)
Admission: EM | Admit: 2022-02-17 | Discharge: 2022-02-17 | Disposition: A | Discharge status: Home / Self Care | Payer: BLUE CROSS/BLUE SHIELD

## 2022-02-17 DIAGNOSIS — F419 Anxiety disorder, unspecified: Secondary | ICD-10-CM | POA: Diagnosis not present

## 2022-02-17 NOTE — BH Assessment (Signed)
LCSW Progress Note   Per Aura Fey, NP, this pt does not require psychiatric hospitalization at this time.  Pt is psychiatrically cleared.  Discharge instructions include several resources for medication management and therapy.  EDP Aura Fey, NP, has been notified.  Omelia Blackwater, MSW, [place] [phone] or [phone]

## 2022-02-17 NOTE — BH Assessment (Addendum)
Comprehensive Clinical Assessment (CCA) Screening, Triage and Referral Note  02/17/2022 Amy Villa [id number]  Disposition: Screening/Triage completed. Patient is noted as Routine. Disposition pending the [facility] providers assessment.   Chief Complaint: Auditory Hallucinations.   Visit Diagnosis: Bipolar Disorder with psychotic features; Anxiety Disorder; PTSD; Rule out Substance Use Disorder  Patient Reported Information How did you hear about [place]? Other (Comment) (EMS)  What Is the Reason for Your Visit/Call Today?  Amy Villa is a 56 y/o female accompanied by EMS. Self reported hx of Bipolar Disorder, PTSD, and Anxiety. Patient says that she called 911 because she was experiencing auditory hallucinations. She has a chronic hx of auditory hallucinations which started during her early childhood years. She describes the hallucinations as unintelligible, "Multiple different voices, men, and women".  They are not command type hallucinations. Today, the voices seemed louder and lasted for a longer span of time as they normal "come and go". Denies visual hallucinations. Since her arrived to the [facility] the auditory hallucinations have subsided. No signs of patient responding to internal stimuli and she doesn't appear to be in any distress. Her auditory hallucinations are often triggered by anxiety. No SI and HI. Denies substance use. However, per chart review she has a history of alcoholic cirrhosis, narcotic abuse, and DT's from significant withdrawal symptoms. Hx of inpatient psychiatric treatment, most recent hospitalization was @ [facility] 01/13/22-01/22/22. She has an outpatient provider to manage her mental needs and says she is med compliant. Unable to recall the name of the provider but has a upcoming appt. Calm/Cooperative. Lives with spouse.   How Long Has This Been Causing You Problems? <Week  What Do You Feel Would Help You the Most Today? Treatment for Depression  or other mood problem; Stress Management; Medication(s)   Have You Recently Had Any Thoughts About Hurting Yourself? No  Are You Planning to Commit Suicide/Harm Yourself At This time? No data recorded  Have you Recently Had Thoughts About [place]? No  Are You Planning to Harm Someone at This Time? No  Explanation: No data recorded  Have You Used Any Alcohol or Drugs in the Past 24 Hours? No (Denies substance use h. However, per chart review she has a history of alcoholic cirrhosis, narcotic abuse, and DT's from significant withdrawal symptoms.)  How Long Ago Did You Use Drugs or Alcohol? No data recorded What Did You Use and How Much? No data recorded  Do You Currently Have a Therapist/Psychiatrist? No data recorded Name of Therapist/Psychiatrist: No data recorded  Have You Been Recently Discharged From Any Office Practice or Programs? No data recorded Explanation of Discharge From Practice/Program: No data recorded    Does Patient Present under Involuntary Commitment? No IVC Papers Initial File Date: No  [place] of Residence: [place]  Patient Currently Receiving the Following Services: Yes  Determination of Need: Routine (7 days)   Options For Referral: Medication Management; Outpatient Therapy   Discharge Disposition: Disposition pending the [facility] providers MSE/assessment. Disposition to be determined.     Amy Villa, Counselor

## 2022-02-17 NOTE — ED Provider Notes (Signed)
Behavioral Health Urgent Care Medical Screening Exam  Patient Name: Amy Villa MRN: [id number] Date of Evaluation: 02/17/22 Chief Complaint:   Diagnosis:  Final diagnoses:  Anxiety   History of Present illness: Amy Villa is a 56 y.o. female. Pt presents voluntarily to [facility] behavioral health for walk-in assessment with EMS escort. Pt is assessed face-to-face by nurse practitioner.   Pt w/ self reported hx of Bipolar Disorder, PTSD, and Anxiety.   Pt reports EMS were called by her husband, Festus Aloe, today due to experiencing auditory hallucinations. Pt states she was experiencing auditory hallucinations of "sounds". Pt states she was hearing "booming, merry go rounds, very little voices". Pt denies command auditory hallucinations. She states auditory hallucinations are chronic. Pt endorses experiencing auditory hallucinations starting in her childhood. States they became louder and longer in duration 5 years ago. Pt states today her auditory hallucinations were loud and lasted for about 45 minutes. Pt has experienced auditory hallucinations like this about once/week. Pt denies current auditory hallucinations. Pt denies experiencing visual hallucinations. Pt is in no acute distress, non-toxic appearing, does not appear to be responding to internal stimuli.  Pt reports poor appetite and sleep due to anxiety. Pt reports feeling anxious currently.   Pt adamantly denies suicidal, homicidal, or violent ideations. Pt reports history of non-suicidal self-injurious behavior, cutting, states last cutting episode occurred 2 months ago. Pt reports "it helps get the rage out". Healed superficial lacerations noted on pt's forearm. Pt reports history of 3 suicide attempts, states last suicide attempt was "over 10 years ago".   Pt reports family psychiatric history is positive. Pt believes her mother carries diagnosis of depression and her sister carries diagnosis of bipolar disorder.    Pt reports she is not currently receiving medication management or counseling services. She is taking medication rx'd from her last psychiatric hospitalization at [facility] from 01/13/22-01/22/22. She is scheduled to see a psychiatrist later this month. Per chart review, pt is scheduled to see Dr. Nehemiah Massed on 03/09/22.   Pt is living w/ her husband. Pt reports relationship is strained. When asked what she means, pt states after her husband called EMS today, "he didn't care, he didn't even say goodbye". Pt feels that husband does not show he cares about her.   Pt denies access to a firearm.   Pt states she is unemployed.   Pt's highest level of education was completion of the 9th grade.   Pt denies use of crack/cocaine, heroin, marijuana, nicotine products. Pt states she has hx of alcohol abuse. States alcohol use has reduced since most recent discharge from [facility] from 01/13/22-01/22/22. States last use of alcohol was on Sunday, had 2 drinks. States since discharge, she has been drinking once/week, about 5 drinks/occasion. Discussed w/ pt concerns regarding continued alcohol use and recommended she refrain from drinking alcohol. Pt verbalized understanding.   Discussed w/ pt follow up with outpatient medication management and counseling. Pt agrees w/ plan. Discussed referral to PHP/IOP program. Pt is interested in referral and gives best contact information to reach her [phone]; email: [email or web address]. Pt gave verbal consent to reach out to her husband Festus Aloe, [phone].  Collateral from [facility], [phone]. Rush Landmark states pt was doing well after discharge from [facility]. States pt's son was recently put in jail, and while this was expected, was stressor for pt. States since learning about her son going to jail, pt has been labile, argumentative, not maintaining ADLs. Rush Landmark states that this is pt's  baseline pattern of behaviors and he feels frustrated about living with  her. Rush Landmark states pt will  "project anger" towards him. Discussed recommendation for outpatient medication management and counseling, referral to PHP/IOP program. Bill agrees w/ plan. Rush Landmark confirms best information for pt is [phone], [email or web address]. Bill agrees to pick up pt from this facility today. Safety planning completed w/ Bill, including: Frequent conversations regarding unsafe thoughts. Locking/monitoring the use of all significant sharps. If there is a firearm in the home, keeping the firearm unloaded, locking the firearm, locking the ammunition separately from the firearm, preventing access to the firearm and the ammunition. Locking/monitoring the use of medications, including over-the-counter medications and supplements. Having a responsible person dispense medications until patient has strengthened coping skills. Room checks for sharps or other harmful objects. Secure all chemical substances that can be ingested or inhaled. Calling 911/EMS or going to the nearest emergency room for any worsening of condition.    Psychiatric Specialty Exam  Presentation  General Appearance:Casual  Eye Contact:Fair  Speech:Clear and Coherent; Normal Rate  Speech Volume:Normal  Handedness:No data recorded  Mood and Affect  Mood:Anxious  Affect:Congruent   Thought Process  Thought Processes:Coherent; Goal Directed; Linear  Descriptions of Associations:Intact  Orientation:Full (Time, Place and Person)  Thought Content:Logical    Hallucinations:None  Ideas of Reference:None  Suicidal Thoughts:No  Homicidal Thoughts:No   Sensorium  Memory:Immediate Fair  Judgment:Intact  Insight:Present   Executive Functions  Concentration:Fair  Attention Span:Fair  [place]   Psychomotor Activity  Psychomotor Activity:Normal   Assets  Assets:Social Support; Catering manager; Housing   Sleep   Sleep:Poor  Number of hours: No data recorded  No data recorded  Physical Exam: Physical Exam Cardiovascular:     Rate and Rhythm: Normal rate.  Pulmonary:     Effort: Pulmonary effort is normal.  Neurological:     Mental Status: She is alert and oriented to person, place, and time.  Psychiatric:        Attention and Perception: Attention and perception normal.        Mood and Affect: Mood is anxious. Affect is blunt.        Speech: Speech normal.        Behavior: Behavior normal. Behavior is cooperative.        Thought Content: Thought content normal.    Review of Systems  Constitutional:  Negative for chills and fever.  Respiratory:  Negative for shortness of breath.   Cardiovascular:  Negative for chest pain and palpitations.  Gastrointestinal:  Negative for abdominal pain.  Neurological:  Negative for headaches.  Psychiatric/Behavioral:  Negative for depression, hallucinations and suicidal ideas. The patient is nervous/anxious.    Blood pressure 131/89, pulse 86, temperature 98.3 F (36.8 C), temperature source Oral, resp. rate 18, SpO2 97 %. There is no height or weight on file to calculate BMI.  Musculoskeletal: Strength & Muscle Tone: within normal limits Gait & Station: normal Patient leans: N/A  [place] MSE Discharge Disposition for Follow up and Recommendations: Based on my evaluation the patient does not appear to have an emergency medical condition and can be discharged with resources and follow up care in outpatient services for Medication Management, Partial Hospitalization Program, and Individual Therapy, Intensive Outpatient Therapy  Tharon Aquas, NP 02/17/2022, 4:35 PM

## 2022-02-17 NOTE — Discharge Instructions (Addendum)
Good afternoon, Amy Villa,  It is imperative that you follow through with treatment recommendations within 5-7 days from the day of discharge to mitigate further risk to your safety and overall mental well-being.  A list of outpatient therapy and psychiatric providers for medication management has been provided below to get you started in finding the right provider for you.  In case of an urgent emergency, you have the option of contacting the Mobile Crisis Unit with [facility] at [phone].  Something to consider is Partial Hospitalization (PHP)/Intensive Outpatient Program (IOP),  It is a higher level of care that is still outpatient but more intensive with groups meeting multiple days during the week for at least 3hrs a day.  [name] would be good options.  You can also inquire about doing virtual if you are not able to meet in person.  These programs focus solely on emotional regulation, distress tolerance, understanding and managing anxiety, and other skills to build self-compassion.  You have been referred to the PHP/IOP program. Please call [phone] if you have any questions. A staff member will be reaching out to you to schedule your intake.         [facility] [address] [place], [place], [address] [phone] phone (Medicare, Private insurance except Tricare, [place], and [facility])  [facility] [address] [place], [place], [address] [phone] phone (Call to confirm insurance coverage) Consultation & Support Services     o Drop-In Hours: 1:00 PM to 5:00 PM     o Days: Monday - Thursday  Crisis Services (24/7)   [place] [address], [place], [place], [address] [phone] phone [email or web address]  (to complete the intake form and  upload ID and insurance cards)  [place], [place], [address] [phone] phone ([place], [place], [place], [place], [place], [place], [place], [place], and certain Medicaid plans)  [place] [phone] [address] [place], [place], [address] [phone] phone [phone] fax (Medicaid, Medicare, Self-pay, call about other insurance coverage)  [facility] (age 29+) [address] [place], [place], [address] [phone] hone [phone] fax ([place], MedCost, [place], [place], [place], [place], [place], [place], [place], certain [employer], [facility], [place])  [place], [place], [address] [phone] phone (Medicare, Medicaid, [place], call about other insurance coverage)  [place] [address] [place], [place], [address] [phone] phone [phone] fax (Call [phone] to see what insurance is accepted) Norma Fredrickson, MD specializes in geropsych)  [facility]  (medication management only) [address], [place], [place], [address] [phone] phone [phone] fax ([address], Medicaid, [place], [place], [place], [place], [place], [place], [place])  [facility] (medication management only) [address], [address] [place], [place], [address] [phone] phone [phone] fax ([address] Medicare, [place], [place], [place])  [facility] [address] [place], [place], [address] [phone] phone [phone] fax ([address], [place], [place], [place], [place], [facility], [place])  [facility] [place], [place], [address] [phone] phone [phone] fax (Medicare, Medicaid, [facility])  [place], [place] [address] [phone] phone ([address], [place], [place], [place], [place], Medicare, [place], [facility]) Does genetic testing for medications; does transcranial magnetic stimulation along  with basic services)  [facility] [place], [place], [address] [phone] phone (Call about insurance coverage)  [facility] [address] [place], [place], [address] [phone] phone [phone] fax (Call about insurance coverage)  [facility] [place] [address] [place], [place], [address] [phone] phone [phone] fax (Call about insurance coverage)  [employer] [facility] [phone] [address] [place], [address] [phone] phone (Medicaid, [place], [place], [place], [place])  [facility] [address] [place], [place], [address] [phone] phone [phone] fax (Medicaid, Medicare, Tricare, call about other insurance coverage)  [facility] [place], [place], [address] [phone] phone ([address] [place], [place], [place], [place], [place] types - Alliance, Stage manager, Partners, [place], [place], [facility] [place], [place], [place], and Complete)  [facility] [address] [place], [place], [address] [phone] phone Completely online treatment platform Contact: [facility] [phone] phone [phone] fax ([address], [place], [place], [employer], Humana, [place], [place], [place], [place], [place])

## 2022-02-18 ENCOUNTER — Telehealth (HOSPITAL_COMMUNITY): Payer: Self-pay | Admitting: Psychiatry

## 2022-02-24 ENCOUNTER — Telehealth (HOSPITAL_COMMUNITY): Payer: Self-pay | Admitting: General Practice

## 2022-02-24 NOTE — BH Assessment (Signed)
Care Management - [place] Follow Up Discharges   Writer attempted to make contact with patient today and was unsuccessful.  Writer left a HIPPA compliant voice message.   Per chart review, patient is scheduled to see Dr. Nehemiah Massed on 03/09/22

## 2022-03-04 ENCOUNTER — Ambulatory Visit (HOSPITAL_COMMUNITY): Payer: BLUE CROSS/BLUE SHIELD | Admitting: Psychiatry

## 2022-03-05 ENCOUNTER — Other Ambulatory Visit: Payer: Self-pay

## 2022-03-05 ENCOUNTER — Emergency Department (HOSPITAL_COMMUNITY): Payer: BLUE CROSS/BLUE SHIELD

## 2022-03-05 ENCOUNTER — Inpatient Hospital Stay (HOSPITAL_COMMUNITY): Payer: BLUE CROSS/BLUE SHIELD

## 2022-03-05 ENCOUNTER — Encounter (HOSPITAL_COMMUNITY): Payer: Self-pay | Admitting: Emergency Medicine

## 2022-03-05 ENCOUNTER — Inpatient Hospital Stay (HOSPITAL_COMMUNITY)
Admission: EM | Admit: 2022-03-05 | Discharge: 2022-03-09 | DRG: 917 | Disposition: A | Discharge status: Home / Self Care | Payer: BLUE CROSS/BLUE SHIELD | Attending: Family Medicine | Admitting: Family Medicine

## 2022-03-05 DIAGNOSIS — G928 Other toxic encephalopathy: Secondary | ICD-10-CM | POA: Diagnosis present

## 2022-03-05 DIAGNOSIS — M899 Disorder of bone, unspecified: Secondary | ICD-10-CM | POA: Diagnosis present

## 2022-03-05 DIAGNOSIS — G9341 Metabolic encephalopathy: Secondary | ICD-10-CM

## 2022-03-05 DIAGNOSIS — Z888 Allergy status to other drugs, medicaments and biological substances status: Secondary | ICD-10-CM

## 2022-03-05 DIAGNOSIS — J690 Pneumonitis due to inhalation of food and vomit: Secondary | ICD-10-CM | POA: Diagnosis present

## 2022-03-05 DIAGNOSIS — E876 Hypokalemia: Secondary | ICD-10-CM | POA: Diagnosis not present

## 2022-03-05 DIAGNOSIS — F1910 Other psychoactive substance abuse, uncomplicated: Secondary | ICD-10-CM | POA: Diagnosis present

## 2022-03-05 DIAGNOSIS — F1010 Alcohol abuse, uncomplicated: Secondary | ICD-10-CM | POA: Diagnosis present

## 2022-03-05 DIAGNOSIS — M1990 Unspecified osteoarthritis, unspecified site: Secondary | ICD-10-CM | POA: Diagnosis present

## 2022-03-05 DIAGNOSIS — K7030 Alcoholic cirrhosis of liver without ascites: Secondary | ICD-10-CM | POA: Diagnosis present

## 2022-03-05 DIAGNOSIS — T401X1A Poisoning by heroin, accidental (unintentional), initial encounter: Principal | ICD-10-CM | POA: Diagnosis present

## 2022-03-05 DIAGNOSIS — G589 Mononeuropathy, unspecified: Secondary | ICD-10-CM

## 2022-03-05 DIAGNOSIS — F32A Depression, unspecified: Secondary | ICD-10-CM | POA: Diagnosis present

## 2022-03-05 DIAGNOSIS — Z7141 Alcohol abuse counseling and surveillance of alcoholic: Secondary | ICD-10-CM

## 2022-03-05 DIAGNOSIS — Z833 Family history of diabetes mellitus: Secondary | ICD-10-CM

## 2022-03-05 DIAGNOSIS — G4700 Insomnia, unspecified: Secondary | ICD-10-CM | POA: Diagnosis present

## 2022-03-05 DIAGNOSIS — Z90710 Acquired absence of both cervix and uterus: Secondary | ICD-10-CM

## 2022-03-05 DIAGNOSIS — Z20822 Contact with and (suspected) exposure to covid-19: Secondary | ICD-10-CM | POA: Diagnosis present

## 2022-03-05 DIAGNOSIS — G5632 Lesion of radial nerve, left upper limb: Secondary | ICD-10-CM | POA: Diagnosis present

## 2022-03-05 DIAGNOSIS — I700 Atherosclerosis of aorta: Secondary | ICD-10-CM | POA: Diagnosis present

## 2022-03-05 DIAGNOSIS — J9602 Acute respiratory failure with hypercapnia: Secondary | ICD-10-CM | POA: Diagnosis present

## 2022-03-05 DIAGNOSIS — Z8249 Family history of ischemic heart disease and other diseases of the circulatory system: Secondary | ICD-10-CM

## 2022-03-05 DIAGNOSIS — I10 Essential (primary) hypertension: Secondary | ICD-10-CM | POA: Diagnosis present

## 2022-03-05 DIAGNOSIS — J9601 Acute respiratory failure with hypoxia: Secondary | ICD-10-CM | POA: Diagnosis present

## 2022-03-05 DIAGNOSIS — K760 Fatty (change of) liver, not elsewhere classified: Secondary | ICD-10-CM | POA: Diagnosis present

## 2022-03-05 DIAGNOSIS — R739 Hyperglycemia, unspecified: Secondary | ICD-10-CM | POA: Diagnosis present

## 2022-03-05 DIAGNOSIS — J449 Chronic obstructive pulmonary disease, unspecified: Secondary | ICD-10-CM | POA: Diagnosis present

## 2022-03-05 DIAGNOSIS — M6282 Rhabdomyolysis: Secondary | ICD-10-CM

## 2022-03-05 DIAGNOSIS — F1014 Alcohol abuse with alcohol-induced mood disorder: Secondary | ICD-10-CM | POA: Diagnosis present

## 2022-03-05 DIAGNOSIS — Z79899 Other long term (current) drug therapy: Secondary | ICD-10-CM

## 2022-03-05 DIAGNOSIS — K7010 Alcoholic hepatitis without ascites: Secondary | ICD-10-CM | POA: Diagnosis present

## 2022-03-05 DIAGNOSIS — R6520 Severe sepsis without septic shock: Secondary | ICD-10-CM

## 2022-03-05 DIAGNOSIS — D649 Anemia, unspecified: Secondary | ICD-10-CM

## 2022-03-05 DIAGNOSIS — M21332 Wrist drop, left wrist: Secondary | ICD-10-CM | POA: Diagnosis present

## 2022-03-05 DIAGNOSIS — F1994 Other psychoactive substance use, unspecified with psychoactive substance-induced mood disorder: Secondary | ICD-10-CM | POA: Diagnosis present

## 2022-03-05 DIAGNOSIS — Z7151 Drug abuse counseling and surveillance of drug abuser: Secondary | ICD-10-CM

## 2022-03-05 DIAGNOSIS — N179 Acute kidney failure, unspecified: Secondary | ICD-10-CM | POA: Diagnosis present

## 2022-03-05 DIAGNOSIS — K7460 Unspecified cirrhosis of liver: Secondary | ICD-10-CM | POA: Diagnosis present

## 2022-03-05 DIAGNOSIS — R680 Hypothermia, not associated with low environmental temperature: Secondary | ICD-10-CM | POA: Diagnosis present

## 2022-03-05 DIAGNOSIS — Z7951 Long term (current) use of inhaled steroids: Secondary | ICD-10-CM

## 2022-03-05 DIAGNOSIS — E872 Acidosis, unspecified: Secondary | ICD-10-CM | POA: Diagnosis present

## 2022-03-05 DIAGNOSIS — Z8614 Personal history of Methicillin resistant Staphylococcus aureus infection: Secondary | ICD-10-CM

## 2022-03-05 DIAGNOSIS — Z87891 Personal history of nicotine dependence: Secondary | ICD-10-CM

## 2022-03-05 DIAGNOSIS — F419 Anxiety disorder, unspecified: Secondary | ICD-10-CM | POA: Diagnosis present

## 2022-03-05 DIAGNOSIS — Z981 Arthrodesis status: Secondary | ICD-10-CM

## 2022-03-05 DIAGNOSIS — A419 Sepsis, unspecified organism: Secondary | ICD-10-CM

## 2022-03-05 LAB — CBC WITH DIFFERENTIAL/PLATELET
Abs Immature Granulocytes: 0.41 10*3/uL — ABNORMAL HIGH (ref 0.00–0.07)
Basophils Absolute: 0.1 10*3/uL (ref 0.0–0.1)
Basophils Relative: 0 %
Eosinophils Absolute: 0 10*3/uL (ref 0.0–0.5)
Eosinophils Relative: 0 %
HCT: 46.1 % — ABNORMAL HIGH (ref 36.0–46.0)
Hemoglobin: 14.5 g/dL (ref 12.0–15.0)
Immature Granulocytes: 2 %
Lymphocytes Relative: 6 %
Lymphs Abs: 1.2 10*3/uL (ref 0.7–4.0)
MCH: 31.3 pg (ref 26.0–34.0)
MCHC: 31.5 g/dL (ref 30.0–36.0)
MCV: 99.4 fL (ref 80.0–100.0)
Monocytes Absolute: 1.2 10*3/uL — ABNORMAL HIGH (ref 0.1–1.0)
Monocytes Relative: 6 %
Neutro Abs: 17.3 10*3/uL — ABNORMAL HIGH (ref 1.7–7.7)
Neutrophils Relative %: 86 %
Platelets: 251 10*3/uL (ref 150–400)
RBC: 4.64 MIL/uL (ref 3.87–5.11)
RDW: 13 % (ref 11.5–15.5)
WBC: 20.1 10*3/uL — ABNORMAL HIGH (ref 4.0–10.5)
nRBC: 0 % (ref 0.0–0.2)

## 2022-03-05 LAB — APTT: aPTT: 37 seconds — ABNORMAL HIGH (ref 24–36)

## 2022-03-05 LAB — CBC
HCT: 35.5 % — ABNORMAL LOW (ref 36.0–46.0)
Hemoglobin: 11.6 g/dL — ABNORMAL LOW (ref 12.0–15.0)
MCH: 30.4 pg (ref 26.0–34.0)
MCHC: 32.7 g/dL (ref 30.0–36.0)
MCV: 93.2 fL (ref 80.0–100.0)
Platelets: 196 10*3/uL (ref 150–400)
RBC: 3.81 MIL/uL — ABNORMAL LOW (ref 3.87–5.11)
RDW: 12.8 % (ref 11.5–15.5)
WBC: 14.2 10*3/uL — ABNORMAL HIGH (ref 4.0–10.5)
nRBC: 0 % (ref 0.0–0.2)

## 2022-03-05 LAB — TROPONIN I (HIGH SENSITIVITY)
Troponin I (High Sensitivity): 25 ng/L — ABNORMAL HIGH (ref <18)
Troponin I (High Sensitivity): 42 ng/L — ABNORMAL HIGH (ref <18)

## 2022-03-05 LAB — CK: Total CK: 1917 U/L — ABNORMAL HIGH (ref 38–234)

## 2022-03-05 LAB — COMPREHENSIVE METABOLIC PANEL
ALT: 29 U/L (ref 0–44)
AST: 42 U/L — ABNORMAL HIGH (ref 15–41)
Albumin: 4.6 g/dL (ref 3.5–5.0)
Alkaline Phosphatase: 109 U/L (ref 38–126)
Anion gap: 16 — ABNORMAL HIGH (ref 5–15)
BUN: 12 mg/dL (ref 6–20)
CO2: 21 mmol/L — ABNORMAL LOW (ref 22–32)
Calcium: 9.1 mg/dL (ref 8.9–10.3)
Chloride: 103 mmol/L (ref 98–111)
Creatinine, Ser: 1.18 mg/dL — ABNORMAL HIGH (ref 0.44–1.00)
GFR, Estimated: 55 mL/min — ABNORMAL LOW (ref >60)
Glucose, Bld: 340 mg/dL — ABNORMAL HIGH (ref 70–99)
Potassium: 5.1 mmol/L (ref 3.5–5.1)
Sodium: 140 mmol/L (ref 135–145)
Total Bilirubin: 0.4 mg/dL (ref 0.3–1.2)
Total Protein: 8.2 g/dL — ABNORMAL HIGH (ref 6.5–8.1)

## 2022-03-05 LAB — GLUCOSE, CAPILLARY
Glucose-Capillary: 85 mg/dL (ref 70–99)
Glucose-Capillary: 80 mg/dL (ref 70–99)
Glucose-Capillary: 99 mg/dL (ref 70–99)

## 2022-03-05 LAB — RESP PANEL BY RT-PCR (FLU A&B, COVID) ARPGX2
Influenza A by PCR: NEGATIVE
Influenza B by PCR: NEGATIVE
SARS Coronavirus 2 by RT PCR: NEGATIVE

## 2022-03-05 LAB — URINALYSIS, ROUTINE W REFLEX MICROSCOPIC
Bilirubin Urine: NEGATIVE
Glucose, UA: 150 mg/dL — AB
Ketones, ur: NEGATIVE mg/dL
Leukocytes,Ua: NEGATIVE
Nitrite: NEGATIVE
Protein, ur: 30 mg/dL — AB
Specific Gravity, Urine: 1.008 (ref 1.005–1.030)
pH: 5 (ref 5.0–8.0)

## 2022-03-05 LAB — RAPID URINE DRUG SCREEN, HOSP PERFORMED
Amphetamines: NOT DETECTED
Barbiturates: NOT DETECTED
Benzodiazepines: NOT DETECTED
Cocaine: NOT DETECTED
Opiates: NOT DETECTED
Tetrahydrocannabinol: NOT DETECTED

## 2022-03-05 LAB — BLOOD GAS, VENOUS
Acid-base deficit: 7.8 mmol/L — ABNORMAL HIGH (ref 0.0–2.0)
Bicarbonate: 23.3 mmol/L (ref 20.0–28.0)
O2 Saturation: 34.9 %
Patient temperature: 37
pCO2, Ven: 75 mmHg (ref 44–60)
pH, Ven: 7.1 — CL (ref 7.25–7.43)
pO2, Ven: <31 mmHg — CL (ref 32–45)

## 2022-03-05 LAB — PROTIME-INR
INR: 1.4 — ABNORMAL HIGH (ref 0.8–1.2)
Prothrombin Time: 17 seconds — ABNORMAL HIGH (ref 11.4–15.2)

## 2022-03-05 LAB — CBG MONITORING, ED
Glucose-Capillary: 312 mg/dL — ABNORMAL HIGH (ref 70–99)
Glucose-Capillary: 51 mg/dL — ABNORMAL LOW (ref 70–99)
Glucose-Capillary: 196 mg/dL — ABNORMAL HIGH (ref 70–99)

## 2022-03-05 LAB — ETHANOL: Alcohol, Ethyl (B): <10 mg/dL (ref <10)

## 2022-03-05 LAB — CREATININE, SERUM
Creatinine, Ser: 0.9 mg/dL (ref 0.44–1.00)
GFR, Estimated: >60 mL/min (ref >60)

## 2022-03-05 LAB — OSMOLALITY: Osmolality: 286 mOsm/kg (ref 275–295)

## 2022-03-05 LAB — LACTIC ACID, PLASMA
Lactic Acid, Venous: 7.4 mmol/L (ref 0.5–1.9)
Lactic Acid, Venous: 3.1 mmol/L (ref 0.5–1.9)

## 2022-03-05 LAB — SALICYLATE LEVEL: Salicylate Lvl: <7 mg/dL — ABNORMAL LOW (ref 7.0–30.0)

## 2022-03-05 LAB — SARS CORONAVIRUS 2 BY RT PCR: SARS Coronavirus 2 by RT PCR: NEGATIVE

## 2022-03-05 LAB — ACETAMINOPHEN LEVEL: Acetaminophen (Tylenol), Serum: <10 ug/mL — ABNORMAL LOW (ref 10–30)

## 2022-03-05 LAB — MRSA NEXT GEN BY PCR, NASAL: MRSA by PCR Next Gen: NOT DETECTED

## 2022-03-05 LAB — BETA-HYDROXYBUTYRIC ACID: Beta-Hydroxybutyric Acid: 0.07 mmol/L (ref 0.05–0.27)

## 2022-03-05 MED ORDER — POLYETHYLENE GLYCOL 3350 17 G PO PACK: 17.0000 g | PACK | Freq: Every day | ORAL | Status: DC | PRN

## 2022-03-05 MED ORDER — ALBUTEROL SULFATE HFA 108 (90 BASE) MCG/ACT IN AERS
2.0000 | INHALATION_SPRAY | Freq: Four times a day (QID) | RESPIRATORY_TRACT | Status: DC | PRN
Start: 2022-03-05 — End: 2022-03-05

## 2022-03-05 MED ORDER — LORAZEPAM 2 MG/ML IJ SOLN
1.0000 mg | INTRAMUSCULAR | Status: AC | PRN
  Administered 2022-03-05 – 2022-03-06 (×2): 1 mg via INTRAVENOUS
  Filled 2022-03-05 (×2): qty 1

## 2022-03-05 MED ORDER — QUETIAPINE FUMARATE 200 MG PO TABS
400.0000 mg | ORAL_TABLET | Freq: Every day | ORAL | Status: DC
  Administered 2022-03-05 – 2022-03-08 (×4): 400 mg via ORAL
  Filled 2022-03-05: qty 4
  Filled 2022-03-05: qty 2
  Filled 2022-03-05: qty 4
  Filled 2022-03-05 (×2): qty 2

## 2022-03-05 MED ORDER — INSULIN ASPART 100 UNIT/ML IJ SOLN
0.0000 [IU] | INTRAMUSCULAR | Status: DC
  Administered 2022-03-06: 3 [IU] via SUBCUTANEOUS
  Administered 2022-03-06 – 2022-03-07 (×3): 2 [IU] via SUBCUTANEOUS
  Administered 2022-03-07: 3 [IU] via SUBCUTANEOUS
  Administered 2022-03-08 (×2): 2 [IU] via SUBCUTANEOUS
  Administered 2022-03-08: 3 [IU] via SUBCUTANEOUS
  Administered 2022-03-09: 2 [IU] via SUBCUTANEOUS
  Filled 2022-03-05: qty 0.15

## 2022-03-05 MED ORDER — TIZANIDINE HCL 4 MG PO TABS
4.0000 mg | ORAL_TABLET | Freq: Three times a day (TID) | ORAL | Status: DC | PRN
Start: 2022-03-05 — End: 2022-03-09

## 2022-03-05 MED ORDER — ADULT MULTIVITAMIN W/MINERALS CH
1.0000 | ORAL_TABLET | Freq: Every day | ORAL | Status: DC
  Administered 2022-03-06 – 2022-03-09 (×4): 1 via ORAL
  Filled 2022-03-05 (×4): qty 1

## 2022-03-05 MED ORDER — QUETIAPINE FUMARATE 50 MG PO TABS: 50.0000 mg | ORAL_TABLET | Freq: Every evening | ORAL | Status: DC | PRN

## 2022-03-05 MED ORDER — LORAZEPAM 1 MG PO TABS
1.0000 mg | ORAL_TABLET | ORAL | Status: AC | PRN
  Administered 2022-03-05 – 2022-03-08 (×6): 1 mg via ORAL
  Filled 2022-03-05 (×6): qty 1

## 2022-03-05 MED ORDER — DULOXETINE HCL 30 MG PO CPEP: 30.0000 mg | ORAL_CAPSULE | Freq: Every day | ORAL | Status: DC

## 2022-03-05 MED ORDER — LACTATED RINGERS IV SOLN: INTRAVENOUS | Status: AC

## 2022-03-05 MED ORDER — SODIUM CHLORIDE 0.9 % IV SOLN
2.0000 g | Freq: Once | INTRAVENOUS | Status: AC
  Administered 2022-03-05: 2 g via INTRAVENOUS
  Filled 2022-03-05: qty 12.5

## 2022-03-05 MED ORDER — GABAPENTIN 400 MG PO CAPS
800.0000 mg | ORAL_CAPSULE | Freq: Three times a day (TID) | ORAL | Status: DC
  Administered 2022-03-05 – 2022-03-09 (×11): 800 mg via ORAL
  Filled 2022-03-05 (×12): qty 2

## 2022-03-05 MED ORDER — LAMOTRIGINE 25 MG PO TABS
25.0000 mg | ORAL_TABLET | Freq: Every day | ORAL | Status: DC
  Administered 2022-03-05 – 2022-03-09 (×5): 25 mg via ORAL
  Filled 2022-03-05 (×5): qty 1

## 2022-03-05 MED ORDER — METRONIDAZOLE 500 MG/100ML IV SOLN
500.0000 mg | Freq: Once | INTRAVENOUS | Status: AC
  Administered 2022-03-05: 500 mg via INTRAVENOUS
  Filled 2022-03-05: qty 100

## 2022-03-05 MED ORDER — CHLORHEXIDINE GLUCONATE CLOTH 2 % EX PADS
6.0000 | MEDICATED_PAD | Freq: Every day | CUTANEOUS | Status: DC
  Administered 2022-03-05: 6 via TOPICAL

## 2022-03-05 MED ORDER — HYDROXYZINE HCL 25 MG PO TABS
50.0000 mg | ORAL_TABLET | Freq: Three times a day (TID) | ORAL | Status: DC
  Administered 2022-03-05 (×2): 50 mg via ORAL
  Administered 2022-03-06: 100 mg via ORAL
  Administered 2022-03-06 – 2022-03-07 (×5): 50 mg via ORAL
  Administered 2022-03-08 (×2): 100 mg via ORAL
  Administered 2022-03-08 – 2022-03-09 (×2): 50 mg via ORAL
  Filled 2022-03-05: qty 2
  Filled 2022-03-05: qty 4
  Filled 2022-03-05 (×2): qty 2
  Filled 2022-03-05: qty 4
  Filled 2022-03-05 (×2): qty 2
  Filled 2022-03-05: qty 4
  Filled 2022-03-05 (×4): qty 2
  Filled 2022-03-05: qty 4

## 2022-03-05 MED ORDER — DEXTROSE 50 % IV SOLN
25.0000 g | INTRAVENOUS | Status: AC
  Administered 2022-03-05: 25 g via INTRAVENOUS
  Filled 2022-03-05: qty 50

## 2022-03-05 MED ORDER — OXCARBAZEPINE 300 MG PO TABS
300.0000 mg | ORAL_TABLET | Freq: Two times a day (BID) | ORAL | Status: DC
  Administered 2022-03-05 – 2022-03-09 (×8): 300 mg via ORAL
  Filled 2022-03-05 (×8): qty 1

## 2022-03-05 MED ORDER — ESCITALOPRAM OXALATE 20 MG PO TABS: 10.0000 mg | ORAL_TABLET | Freq: Every day | ORAL | Status: DC

## 2022-03-05 MED ORDER — ENOXAPARIN SODIUM 40 MG/0.4ML IJ SOSY
40.0000 mg | PREFILLED_SYRINGE | INTRAMUSCULAR | Status: DC
  Administered 2022-03-05 – 2022-03-09 (×5): 40 mg via SUBCUTANEOUS
  Filled 2022-03-05 (×5): qty 0.4

## 2022-03-05 MED ORDER — THIAMINE HCL 100 MG PO TABS
100.0000 mg | ORAL_TABLET | Freq: Every day | ORAL | Status: DC
Start: 2022-03-05 — End: 2022-03-09
  Administered 2022-03-06 – 2022-03-09 (×4): 100 mg via ORAL
  Filled 2022-03-05 (×4): qty 1

## 2022-03-05 MED ORDER — ASPIRIN 300 MG RE SUPP
300.0000 mg | Freq: Once | RECTAL | Status: AC
  Administered 2022-03-05: 300 mg via RECTAL
  Filled 2022-03-05: qty 1

## 2022-03-05 MED ORDER — ALBUTEROL SULFATE (2.5 MG/3ML) 0.083% IN NEBU: 2.5000 mg | INHALATION_SOLUTION | RESPIRATORY_TRACT | Status: DC | PRN

## 2022-03-05 MED ORDER — FOLIC ACID 1 MG PO TABS
1.0000 mg | ORAL_TABLET | Freq: Every day | ORAL | Status: DC
  Administered 2022-03-06 – 2022-03-09 (×4): 1 mg via ORAL
  Filled 2022-03-05 (×4): qty 1

## 2022-03-05 MED ORDER — GADOBUTROL 1 MMOL/ML IV SOLN
6.0000 mL | Freq: Once | INTRAVENOUS | Status: AC | PRN
Start: 2022-03-05 — End: 2022-03-05
  Administered 2022-03-05: 6 mL via INTRAVENOUS

## 2022-03-05 MED ORDER — VANCOMYCIN HCL IN DEXTROSE 1-5 GM/200ML-% IV SOLN
1000.0000 mg | Freq: Once | INTRAVENOUS | Status: AC
  Administered 2022-03-05: 1000 mg via INTRAVENOUS
  Filled 2022-03-05: qty 200

## 2022-03-05 MED ORDER — GABAPENTIN 800 MG PO TABS
800.0000 mg | ORAL_TABLET | Freq: Three times a day (TID) | ORAL | Status: DC
  Filled 2022-03-05: qty 1

## 2022-03-05 MED ORDER — LACTATED RINGERS IV BOLUS
1000.0000 mL | Freq: Once | INTRAVENOUS | Status: AC
Start: 2022-03-05 — End: 2022-03-05
  Administered 2022-03-05: 1000 mL via INTRAVENOUS

## 2022-03-05 MED ORDER — NICOTINE 14 MG/24HR TD PT24
14.0000 mg | MEDICATED_PATCH | Freq: Every day | TRANSDERMAL | Status: DC
  Administered 2022-03-05 – 2022-03-08 (×4): 14 mg via TRANSDERMAL
  Filled 2022-03-05 (×5): qty 1

## 2022-03-05 MED ORDER — FLUTICASONE FUROATE-VILANTEROL 200-25 MCG/ACT IN AEPB
1.0000 | INHALATION_SPRAY | Freq: Every day | RESPIRATORY_TRACT | Status: DC
  Administered 2022-03-06 – 2022-03-09 (×4): 1 via RESPIRATORY_TRACT
  Filled 2022-03-05: qty 28

## 2022-03-05 MED ORDER — THIAMINE HCL 100 MG/ML IJ SOLN
100.0000 mg | Freq: Every day | INTRAMUSCULAR | Status: DC
  Administered 2022-03-05: 100 mg via INTRAVENOUS
  Filled 2022-03-05: qty 2

## 2022-03-05 MED ORDER — DOCUSATE SODIUM 100 MG PO CAPS: 100.0000 mg | ORAL_CAPSULE | Freq: Two times a day (BID) | ORAL | Status: DC | PRN

## 2022-03-05 NOTE — Progress Notes (Signed)
Left upper extremity weakness and numbness  MRI head ordered

## 2022-03-05 NOTE — Sepsis Progress Note (Signed)
Monitoring for the code sepsis protocol. °

## 2022-03-05 NOTE — H&P (Signed)
NAME:  Amy Villa, MRN:  [id number], DOB:  1965-09-23, LOS: 0 ADMISSION DATE:  03/05/2022, CONSULTATION DATE:  03/05/22 REFERRING MD:  Randal Buba, CHIEF COMPLAINT:  found unresponsive   History of Present Illness:  56yF with history of alcohol use, alcoholic cirrhosis with EV, COPD, SIMD who came in after being found unresponsive at a motel having just snorted heroin. Apparently given '8mg'$  narcan in the field.  In ED she was found to be hypothermic with acute hypercapnic respiratory failure started on BiPAP with vbg 7.1/75, lactate 7, EKG with ST depressions and QTc mildly prolonged without other interval prolongation.  Pertinent  Medical History  Alcohol use Alcoholic cirrhosis with EV COPD SIMD  Significant Hospital Events: Including procedures, antibiotic start and stop dates in addition to other pertinent events   03/05/22 admitted, started on BiPAP for acute hypercapnic respiratory failure  Interim History / Subjective:   By the time I've arrived to ED she remains drowsy but she is easily arousable, follows commands, answers questions appropriately.   Objective   Blood pressure (!) 121/93, pulse 96, temperature 98.2 F (36.8 C), temperature source Oral, resp. rate 13, height '5\' 3"'$  (1.6 m), weight 58.8 kg, SpO2 100 %.       No intake or output data in the 24 hours ending 03/05/22 0508 Filed Weights   03/05/22 0314  Weight: 58.8 kg    Examination: General appearance: 56 y.o., female, drowsy but arousable Eyes: PERRL, tracking appropriately HENT: NCAT; dry MM Lungs: diminished bl no wheeze or rhonchi, with normal respiratory effort CV: tachy RR, no murmur  Abdomen: Soft, non-tender; non-distended, BS present  Extremities: No peripheral edema, warm Skin: Normal turgor and texture; no rash Neuro: follows commands, grossly nonfocal   Resolved Hospital Problem list    Assessment & Plan:  # Acute toxic and metabolic encephalopathy # Polysubstance use  # Suspected  intoxication Definite component of heroin intoxication. - supportive care as below - CIWA - thiamine  # Acute hypercapnic respiratory failure She's drowsy but alert enough to remove mask.  - will continue cautious trial of NIV - although bradypneic she's breathing frequently enough and attentive enough that I don't think we need narcan infusion now  # Possible sepsis, unclear source - f/u UA - vanc/cefepime follow cultures, MRSA nare swab and narrow as able, dc at 48h if no clear sign of infx  # Lactic acidosis # Anion gap metabolic acidosis - check serum OG, BHB - trend, may have poor lactic clearance in setting cirrhosis  # ST depressions # Elevated troponin Says she has only minor chest pressure sensation which she's always attributed to anxiety. - trend trops, repeat EKG - full dose ASA   # AKI - f/u response to initial resuscitative measures, trend bmet - avoid nephrotoxins  # Leukocytosis - ctm  # COPD - bronchodilators  Best Practice (right click and "Reselect all SmartList Selections" daily)   Diet/type: NPO DVT prophylaxis: LMWH GI prophylaxis: PPI Lines: N/A Foley:  N/A Code Status:  full code Last date of multidisciplinary goals of care discussion [Pt updated at bedside]  Labs   CBC: Recent Labs  Lab 03/05/22 0320  WBC 20.1*  NEUTROABS 17.3*  HGB 14.5  HCT 46.1*  MCV 99.4  PLT 025    Basic Metabolic Panel: Recent Labs  Lab 03/05/22 0320  NA 140  K 5.1  CL 103  CO2 21*  GLUCOSE 340*  BUN 12  CREATININE 1.18*  CALCIUM 9.1   GFR: Estimated  Creatinine Clearance: 44.6 mL/min (A) (by C-G formula based on SCr of 1.18 mg/dL (H)). Recent Labs  Lab 03/05/22 0320  WBC 20.1*  LATICACIDVEN 7.4*    Liver Function Tests: Recent Labs  Lab 03/05/22 0320  AST 42*  ALT 29  ALKPHOS 109  BILITOT 0.4  PROT 8.2*  ALBUMIN 4.6   No results for input(s): "LIPASE", "AMYLASE" in the last 168 hours. No results for input(s): "AMMONIA" in the  last 168 hours.  ABG    Component Value Date/Time   PHART 7.317 (L) 01/08/2022 2305   PCO2ART 53.4 (H) 01/08/2022 2305   PO2ART 591 (H) 01/08/2022 2305   HCO3 23.3 03/05/2022 0338   TCO2 29 01/08/2022 2305   ACIDBASEDEF 7.8 (H) 03/05/2022 0338   O2SAT 34.9 03/05/2022 0338     Coagulation Profile: Recent Labs  Lab 03/05/22 0320  INR 1.4*    Cardiac Enzymes: No results for input(s): "CKTOTAL", "CKMB", "CKMBINDEX", "TROPONINI" in the last 168 hours.  HbA1C: Hgb A1c MFr Bld  Date/Time Value Ref Range Status  11/30/2016 03:13 PM 6.0 4.6 - 6.5 % Final    Comment:    Glycemic Control Guidelines for People with Diabetes:Non Diabetic:  <6%Goal of Therapy: <7%Additional Action Suggested:  >8%   09/12/2008 01:25 AM  4.6 - 6.1 % Final   5.8 (NOTE)   The ADA recommends the following therapeutic goal for glycemic   control related to Hgb A1C measurement:   Goal of Therapy:   < 7.0% Hgb A1C   Reference: American Diabetes Association: Clinical Practice   Recommendations 2008, Diabetes Care,  2008, 31:(Suppl 1).    CBG: Recent Labs  Lab 03/05/22 0333  GLUCAP 312*    Review of Systems:   12 point review of systems is negative except as in hpi  Past Medical History:  She,  has a past medical history of Alcoholic hepatitis, Aortic atherosclerosis ([place]), Arthritis, Carpal tunnel syndrome on both sides (02/2012), Cigarette nicotine dependence, Contact lens/glasses fitting, Dental crowns present, Elevated LFTs, Fatty liver, Fluid retention, History of MRSA infection, Hypertension, Insomnia, Migraines, Osteoarthritis, Parasomnia, and Wears partial dentures.   Surgical History:   Past Surgical History:  Procedure Laterality Date   ANKLE ARTHROSCOPY WITH RECONSTRUCTION  07/2017   BIOPSY  02/24/2018   Procedure: BIOPSY;  Surgeon: Jackquline Denmark, MD;  Location: [facility];  Service: Endoscopy;;   CARPAL TUNNEL RELEASE  03/17/2012   Procedure: CARPAL TUNNEL RELEASE;  Surgeon: Roseanne Kaufman,  MD;  Location: [facility];  Service: Orthopedics;  Laterality: Bilateral;  left limited open carpal tunnel release. right carpal tunnel injection with 1cc of celestone and 2cc of marcaine.25%   CARPAL TUNNEL RELEASE  06/30/2012   Procedure: CARPAL TUNNEL RELEASE;  Surgeon: Roseanne Kaufman, MD;  Location: [facility];  Service: Orthopedics;  Laterality: Right;  RIGHT LIMITED OPEN CARPAL TUNNEL RELEASE   CARPOMETACARPAL ([facility]) FUSION OF THUMB Left 06/23/2013   Procedure: LEFT CARPOMETACARPEL ([place]) FUSION OF THUMB WITH AUTOGRAFT FROM RADIUS AND REPAIR AS NECESSARY;  Surgeon: Roseanne Kaufman, MD;  Location: [facility];  Service: Orthopedics;  Laterality: Left;   COLONOSCOPY  2014   ESOPHAGOGASTRODUODENOSCOPY  05/2017   ESOPHAGOGASTRODUODENOSCOPY (EGD) WITH PROPOFOL N/A 02/24/2018   Procedure: ESOPHAGOGASTRODUODENOSCOPY (EGD) WITH PROPOFOL;  Surgeon: Jackquline Denmark, MD;  [facility];  Service: Endoscopy;  Laterality: N/A;   HARDWARE REMOVAL Left 07/10/2013   Procedure: HARDWARE REMOVAL;  Surgeon: Roseanne Kaufman, MD;  [facility];  Service: Orthopedics;  Laterality: Left;  INCISION AND DRAINAGE OF WOUND Left 07/10/2013   Procedure: IRRIGATION AND DEBRIDEMENT WOUND left wrist  ;  Surgeon: Roseanne Kaufman, MD;  Location[facility];  Service: Orthopedics;  Laterality: Left;   LAPAROSCOPIC VAGINAL HYSTERECTOMY  02/24/2010   LAPAROSCOPY  06/2010   for adhesions   LEEP     x 2   LUMBAR DISC SURGERY  03/23/2003   left L5-S1 discectomy with microdissection   LUMBAR DISC SURGERY  05/23/2003   redo discectomy L5-S1 with microdissection     Social History:   reports that she has quit smoking. She has never used smokeless tobacco. She reports current alcohol use. She reports that she does not use drugs.   Family History:  Her family history includes Colon polyps in her sister; Diabetes in her mother and sister; Hypertension in her mother and sister; Lung cancer in her maternal  grandmother. There is no history of Colon cancer, Esophageal cancer, Pancreatic cancer, Stomach cancer, or Liver disease.   Allergies Allergies  Allergen Reactions   Hydroxyamine Other (See Comments)    Not effective   Pregabalin Other (See Comments)    Angry, upset      Home Medications  Prior to Admission medications   Medication Sig Start Date End Date Taking? Authorizing Provider  betamethasone dipropionate 0.05 % lotion Apply 1 Application topically 2 (two) times daily as needed for itching. 12/26/21   [provider]  budesonide-formoterol (SYMBICORT) 160-4.5 MCG/ACT inhaler Inhale 2 puffs into the lungs 2 (two) times daily as needed (shortness of breath).    [provider]  chlordiazePOXIDE (LIBRIUM) 25 MG capsule Take twice daily for 1 day, and then once daily for 1 day and then stop 01/13/22   Ghimire, Henreitta Leber, MD  clotrimazole-betamethasone (LOTRISONE) cream Apply 1 application topically 2 (two) times daily. Patient taking differently: Apply 1 application  topically 2 (two) times daily as needed (rash/irritation). 03/08/18   [name], PA-C  DULoxetine (CYMBALTA) 30 MG capsule Take 30 mg by mouth daily. 12/16/21   [provider]  folic acid (FOLVITE) 1 MG tablet Take 1 tablet (1 mg total) by mouth daily. 01/13/22   Ghimire, Henreitta Leber, MD  lactulose ([place]) 10 GM/15ML solution TAKE 30ML BY MOUTH EVERY DAY Patient taking differently: Take 20 g by mouth daily as needed (constipation). 06/10/18   [name], MD  lamoTRIgine (LAMICTAL) 25 MG tablet Take 1 tablet (25 mg total) by mouth daily. 01/13/22   Ghimire, Henreitta Leber, MD  Multiple Vitamin (MULTIVITAMIN WITH MINERALS) TABS tablet Take 1 tablet by mouth daily. 01/13/22   Ghimire, Henreitta Leber, MD  pantoprazole (PROTONIX) 40 MG tablet TAKE 1 TABLET(40 MG) BY MOUTH DAILY BEFORE BREAKFAST Patient taking differently: Take 40 mg by mouth daily before breakfast. 08/15/18   [name], MD  polyvinyl  alcohol (ARTIFICIAL TEARS) 1.4 % ophthalmic solution Place 1 drop into both eyes daily as needed for dry eyes.     [provider]  QUEtiapine (SEROQUEL) 50 MG tablet Take 1 tablet (50 mg total) by mouth at bedtime as needed (insomnia). 01/13/22   Ghimire, Henreitta Leber, MD  VENTOLIN HFA 108 (90 Base) MCG/ACT inhaler INHALE 2 PUFFS INTO THE LUNGS EVERY 6 HOURS AS NEEDED FOR WHEEZING OR SHORTNESS OF BREATH Patient taking differently: Inhale 2 puffs into the lungs every 6 (six) hours as needed for wheezing or shortness of breath. 05/19/18   [name], PA-C     Critical care time: 35 minutes

## 2022-03-05 NOTE — Sepsis Progress Note (Addendum)
Notified provider of need to order fluid bolus.  Per MD, patient has rales so she is holding on IVF resuscitation at this time.

## 2022-03-05 NOTE — ED Provider Notes (Signed)
[facility] DEPT Provider Note   CSN: [id number] Arrival date & time: 03/05/22  0308     History  Chief Complaint  Patient presents with   Drug Overdose    Amy Villa is a 57 y.o. female.  The history is provided by the EMS personnel. The history is limited by the condition of the patient.  Drug Overdose This is a new problem. Episode onset: unknown. The problem occurs rarely. The problem has been gradually improving. Nothing aggravates the symptoms. Treatments tried: 8 mg of narcan. The treatment provided moderate relief.  Patient with a h/o alcohol abuse presents following reportedly snorting heroin and being found unresponsive at a motel.  8 mg of narcan given with improved alertness and wheezing and rales.      Past Medical History:  Diagnosis Date   Alcoholic hepatitis    Aortic atherosclerosis (HCC)    Arthritis    neck   Carpal tunnel syndrome on both sides 02/2012   Cigarette nicotine dependence    Contact lens/glasses fitting    wears contacts or glasses   Dental crowns present    x 2 upper front   Elevated LFTs    Fatty liver    Fluid retention    History of MRSA infection    Hypertension    Insomnia    Migraines    Osteoarthritis    Parasomnia    Wears partial dentures    lower    Home Medications Prior to Admission medications   Medication Sig Start Date End Date Taking? Authorizing Provider  betamethasone dipropionate 0.05 % lotion Apply 1 Application topically 2 (two) times daily as needed for itching. 12/26/21   [provider]  budesonide-formoterol (SYMBICORT) 160-4.5 MCG/ACT inhaler Inhale 2 puffs into the lungs 2 (two) times daily as needed (shortness of breath).    [provider]  chlordiazePOXIDE (LIBRIUM) 25 MG capsule Take twice daily for 1 day, and then once daily for 1 day and then stop 01/13/22   Ghimire, Henreitta Leber, MD  clotrimazole-betamethasone (LOTRISONE) cream Apply 1 application  topically 2 (two) times daily. Patient taking differently: Apply 1 application  topically 2 (two) times daily as needed (rash/irritation). 03/08/18   [name], PA-C  DULoxetine (CYMBALTA) 30 MG capsule Take 30 mg by mouth daily. 12/16/21   [provider]  folic acid (FOLVITE) 1 MG tablet Take 1 tablet (1 mg total) by mouth daily. 01/13/22   Ghimire, Henreitta Leber, MD  lactulose ([place]) 10 GM/15ML solution TAKE 30ML BY MOUTH EVERY DAY Patient taking differently: Take 20 g by mouth daily as needed (constipation). 06/10/18   [name], MD  lamoTRIgine (LAMICTAL) 25 MG tablet Take 1 tablet (25 mg total) by mouth daily. 01/13/22   Ghimire, Henreitta Leber, MD  Multiple Vitamin (MULTIVITAMIN WITH MINERALS) TABS tablet Take 1 tablet by mouth daily. 01/13/22   Ghimire, Henreitta Leber, MD  pantoprazole (PROTONIX) 40 MG tablet TAKE 1 TABLET(40 MG) BY MOUTH DAILY BEFORE BREAKFAST Patient taking differently: Take 40 mg by mouth daily before breakfast. 08/15/18   [name], MD  polyvinyl alcohol (ARTIFICIAL TEARS) 1.4 % ophthalmic solution Place 1 drop into both eyes daily as needed for dry eyes.     [provider]  QUEtiapine (SEROQUEL) 50 MG tablet Take 1 tablet (50 mg total) by mouth at bedtime as needed (insomnia). 01/13/22   Ghimire, Henreitta Leber, MD  VENTOLIN HFA 108 (90 Base) MCG/ACT inhaler INHALE 2 PUFFS INTO  THE LUNGS EVERY 6 HOURS AS NEEDED FOR WHEEZING OR SHORTNESS OF BREATH Patient taking differently: Inhale 2 puffs into the lungs every 6 (six) hours as needed for wheezing or shortness of breath. 05/19/18   [name], PA-C      Allergies    Hydroxyamine and Pregabalin    Review of Systems   Review of Systems  Unable to perform ROS: Acuity of condition  Constitutional:  Negative for fever.    Physical Exam Updated Vital Signs BP (!) 121/93   Pulse 96   Temp 98.2 F (36.8 C) (Oral)   Resp 13   Ht '5\' 3"'$  (1.6 m)   Wt 58.8 kg   SpO2 100%   BMI 22.96 kg/m   Physical Exam Vitals and nursing note reviewed. Exam conducted with a chaperone present.  Constitutional:      General: She is not in acute distress.    Appearance: She is well-developed.  HENT:     Head: Normocephalic and atraumatic.     Nose: Nose normal.  Eyes:     Pupils: Pupils are equal, round, and reactive to light.  Cardiovascular:     Rate and Rhythm: Regular rhythm. Tachycardia present.     Pulses: Normal pulses.     Heart sounds: Normal heart sounds.  Pulmonary:     Effort: No respiratory distress.     Breath sounds: Wheezing, rhonchi and rales present.  Abdominal:     General: Bowel sounds are normal. There is no distension.     Palpations: Abdomen is soft.     Tenderness: There is no abdominal tenderness. There is no guarding or rebound.  Genitourinary:    Vagina: No vaginal discharge.  Musculoskeletal:        General: Normal range of motion.     Cervical back: Normal range of motion and neck supple. No rigidity or tenderness.  Skin:    General: Skin is warm and dry.     Capillary Refill: Capillary refill takes less than 2 seconds.     Findings: No erythema or rash.  Neurological:     Deep Tendon Reflexes: Reflexes normal.  Psychiatric:     Comments: Unable      ED Results / Procedures / Treatments   Labs (all labs ordered are listed, but only abnormal results are displayed) Results for orders placed or performed during the hospital encounter of 03/05/22  SARS Coronavirus 2 by RT PCR (hospital order, performed in [facility] hospital lab) *cepheid single result test* Anterior Nasal Swab   Specimen: Anterior Nasal Swab  Result Value Ref Range   SARS Coronavirus 2 by RT PCR NEGATIVE NEGATIVE  Lactic acid, plasma  Result Value Ref Range   Lactic Acid, Venous 7.4 (HH) 0.5 - 1.9 mmol/L  Comprehensive metabolic panel  Result Value Ref Range   Sodium 140 135 - 145 mmol/L   Potassium 5.1 3.5 - 5.1 mmol/L   Chloride 103 98 - 111 mmol/L   CO2 21 (L) 22 - 32  mmol/L   Glucose, Bld 340 (H) 70 - 99 mg/dL   BUN 12 6 - 20 mg/dL   Creatinine, Ser 1.18 (H) 0.44 - 1.00 mg/dL   Calcium 9.1 8.9 - 10.3 mg/dL   Total Protein 8.2 (H) 6.5 - 8.1 g/dL   Albumin 4.6 3.5 - 5.0 g/dL   AST 42 (H) 15 - 41 U/L   ALT 29 0 - 44 U/L   Alkaline Phosphatase 109 38 - 126 U/L  Total Bilirubin 0.4 0.3 - 1.2 mg/dL   GFR, Estimated 55 (L) >60 mL/min   Anion gap 16 (H) 5 - 15  CBC with Differential  Result Value Ref Range   WBC 20.1 (H) 4.0 - 10.5 K/uL   RBC 4.64 3.87 - 5.11 MIL/uL   Hemoglobin 14.5 12.0 - 15.0 g/dL   HCT 46.1 (H) 36.0 - 46.0 %   MCV 99.4 80.0 - 100.0 fL   MCH 31.3 26.0 - 34.0 pg   MCHC 31.5 30.0 - 36.0 g/dL   RDW 13.0 11.5 - 15.5 %   Platelets 251 150 - 400 K/uL   nRBC 0.0 0.0 - 0.2 %   Neutrophils Relative % 86 %   Neutro Abs 17.3 (H) 1.7 - 7.7 K/uL   Lymphocytes Relative 6 %   Lymphs Abs 1.2 0.7 - 4.0 K/uL   Monocytes Relative 6 %   Monocytes Absolute 1.2 (H) 0.1 - 1.0 K/uL   Eosinophils Relative 0 %   Eosinophils Absolute 0.0 0.0 - 0.5 K/uL   Basophils Relative 0 %   Basophils Absolute 0.1 0.0 - 0.1 K/uL   Immature Granulocytes 2 %   Abs Immature Granulocytes 0.41 (H) 0.00 - 0.07 K/uL  Protime-INR  Result Value Ref Range   Prothrombin Time 17.0 (H) 11.4 - 15.2 seconds   INR 1.4 (H) 0.8 - 1.2  APTT  Result Value Ref Range   aPTT 37 (H) 24 - 36 seconds  Salicylate level  Result Value Ref Range   Salicylate Lvl <1.1 (L) 7.0 - 30.0 mg/dL  Acetaminophen level  Result Value Ref Range   Acetaminophen (Tylenol), Serum <10 (L) 10 - 30 ug/mL  Ethanol  Result Value Ref Range   Alcohol, Ethyl (B) <10 <10 mg/dL  Blood gas, venous  Result Value Ref Range   pH, Ven 7.1 (LL) 7.25 - 7.43   pCO2, Ven 75 (HH) 44 - 60 mmHg   pO2, Ven <31 (LL) 32 - 45 mmHg   Bicarbonate 23.3 20.0 - 28.0 mmol/L   Acid-base deficit 7.8 (H) 0.0 - 2.0 mmol/L   O2 Saturation 34.9 %   Patient temperature 37.0   CBG monitoring, ED  Result Value Ref Range    Glucose-Capillary 312 (H) 70 - 99 mg/dL  Troponin I (High Sensitivity)  Result Value Ref Range   Troponin I (High Sensitivity) 25 (H) <18 ng/L   CT HEAD WO CONTRAST (5MM)  Result Date: 03/05/2022 CLINICAL DATA:  Overdose with altered mental status. EXAM: CT HEAD WITHOUT CONTRAST TECHNIQUE: Contiguous axial images were obtained from the base of the skull through the vertex without intravenous contrast. RADIATION DOSE REDUCTION: This exam was performed according to the departmental dose-optimization program which includes automated exposure control, adjustment of the mA and/or kV according to patient size and/or use of iterative reconstruction technique. COMPARISON:  January 08, 2022 FINDINGS: Brain: No evidence of acute infarction, hemorrhage, hydrocephalus, extra-axial collection or mass lesion/mass effect. Vascular: No hyperdense vessel or unexpected calcification. Skull: Normal. Negative for fracture or focal lesion. Sinuses/Orbits: No acute finding. Other: Mild right frontal scalp soft tissue swelling is noted. IMPRESSION: 1. No acute intracranial abnormality. Electronically Signed   By: Virgina Norfolk M.D.   On: 03/05/2022 04:19   DG Chest Port 1 View  Result Date: 03/05/2022 CLINICAL DATA:  Possible sepsis, question overdose EXAM: PORTABLE CHEST 1 VIEW COMPARISON:  01/09/2022 FINDINGS: Cardiac shadow is within normal limits. The lungs are clear bilaterally. No free air is noted in the  upper abdomen. No bony abnormality is seen. IMPRESSION: No acute abnormality noted. Electronically Signed   By: Inez Catalina M.D.   On: 03/05/2022 03:36     EKG EKG Interpretation  Date/Time:  Thursday March 05 2022 03:51:47 EDT Ventricular Rate:  102 PR Interval:  165 QRS Duration: 119 QT Interval:  400 QTC Calculation: 522 R Axis:   87 Text Interpretation: Sinus tachycardia Nonspecific intraventricular conduction delay Repol abnrm suggests ischemia, diffuse leads Confirmed by Randal Buba, Matteus Mcnelly ([id number]) on  03/05/2022 4:02:47 AM  Radiology CT HEAD WO CONTRAST (5MM)  Result Date: 03/05/2022 CLINICAL DATA:  Overdose with altered mental status. EXAM: CT HEAD WITHOUT CONTRAST TECHNIQUE: Contiguous axial images were obtained from the base of the skull through the vertex without intravenous contrast. RADIATION DOSE REDUCTION: This exam was performed according to the departmental dose-optimization program which includes automated exposure control, adjustment of the mA and/or kV according to patient size and/or use of iterative reconstruction technique. COMPARISON:  January 08, 2022 FINDINGS: Brain: No evidence of acute infarction, hemorrhage, hydrocephalus, extra-axial collection or mass lesion/mass effect. Vascular: No hyperdense vessel or unexpected calcification. Skull: Normal. Negative for fracture or focal lesion. Sinuses/Orbits: No acute finding. Other: Mild right frontal scalp soft tissue swelling is noted. IMPRESSION: 1. No acute intracranial abnormality. Electronically Signed   By: Virgina Norfolk M.D.   On: 03/05/2022 04:19   DG Chest Port 1 View  Result Date: 03/05/2022 CLINICAL DATA:  Possible sepsis, question overdose EXAM: PORTABLE CHEST 1 VIEW COMPARISON:  01/09/2022 FINDINGS: Cardiac shadow is within normal limits. The lungs are clear bilaterally. No free air is noted in the upper abdomen. No bony abnormality is seen. IMPRESSION: No acute abnormality noted. Electronically Signed   By: Inez Catalina M.D.   On: 03/05/2022 03:36    Procedures Procedures    Medications Ordered in ED Medications  lactated ringers infusion ( Intravenous New Bag/Given 03/05/22 0343)  ceFEPIme (MAXIPIME) 2 g in sodium chloride 0.9 % 100 mL IVPB (0 g Intravenous Stopped 03/05/22 0410)  metroNIDAZOLE (FLAGYL) IVPB 500 mg (500 mg Intravenous New Bag/Given 03/05/22 0343)  vancomycin (VANCOCIN) IVPB 1000 mg/200 mL premix (1,000 mg Intravenous New Bag/Given 03/05/22 0342)    ED Course/ Medical Decision Making/ A&P                            Medical Decision Making Found unresponsive after snorting heroin, unknown down time  Amount and/or Complexity of Data Reviewed Independent Historian: EMS    Details: see above External Data Reviewed: labs and notes.    Details: previous notes and Labs: ordered.    Details: all labs reivewed:  elevated white count 20.1, normal hemoglobin, normal platelet count, elevated PT and INR 17, INR 1.4. Lactate was elevated 7.4.  Acidotic on VBG pH 7.1 PO2 elevated 75.  Negative pregnancy test. Radiology: ordered and independent interpretation performed.    Details: negative head CT, negative CXR by me ECG/medicine tests: ordered and independent interpretation performed. Decision-making details documented in ED Course. Discussion of management or test interpretation with external provider(s): Dr. Zenaida Deed ICU, will see the patient  Risk Prescription drug management. Drug therapy requiring intensive monitoring for toxicity. Decision regarding hospitalization.  Critical Care Total time providing critical care: 75 minutes (Sepsis bundle, bipap initiated by me)    Final Clinical Impression(s) / ED Diagnoses Final diagnoses:  Accidental overdose of heroin, initial encounter ([place])  Sepsis with acute organ dysfunction, due to unspecified organism,  unspecified type, unspecified whether septic shock present ([place])  Hyperglycemia  Acute respiratory failure with hypoxia and hypercarbia ([place])    The patient appears reasonably stabilized for admission considering the current resources, flow, and capabilities available in the ED at this time, and I doubt any other [facility] requiring further screening and/or treatment in the ED prior to admission.    Artha Chiasson, MD 03/05/22 3086

## 2022-03-05 NOTE — Progress Notes (Signed)
A consult was received from an ED physician for Vancomycin and Cefepime per pharmacy dosing.  The patient's profile has been reviewed for ht/wt/allergies/indication/available labs.    A one time order has been placed for Vancomycin 1gm IV and Cefepime 2gm IV.    Further antibiotics/pharmacy consults should be ordered by admitting physician if indicated.                       Thank you, Everette Rank, PharmD 03/05/2022  3:35 AM

## 2022-03-05 NOTE — ED Notes (Signed)
Bear hugger was put on pt

## 2022-03-05 NOTE — Progress Notes (Signed)
Bipap not indicated at this time.  Pt awake and alert x4.  Bipap on stand by at bedside.

## 2022-03-05 NOTE — ED Triage Notes (Signed)
BIBA from home for drug (heroin) overdose, CBG 428, shivering upon arrival with distended abd

## 2022-03-05 NOTE — Progress Notes (Addendum)
NAME:  Amy Villa, MRN:  [id number], DOB:  February 23, 1966, LOS: 0 ADMISSION DATE:  03/05/2022, CONSULTATION DATE:  03/05/22 REFERRING MD:  Randal Buba, CHIEF COMPLAINT:  found unresponsive   History of Present Illness:  55yF with history of alcohol use, alcoholic cirrhosis with EV, COPD, SIMD who came in after being found unresponsive at a motel having just snorted heroin. Apparently given '8mg'$  narcan in the field.  In ED she was found to be hypothermic with acute hypercapnic respiratory failure started on BiPAP with vbg 7.1/75, lactate 7, EKG with ST depressions and QTc mildly prolonged without other interval prolongation.  Pertinent  Medical History  Alcohol use Alcoholic cirrhosis with EV COPD SIMD  Significant Hospital Events: Including procedures, antibiotic start and stop dates in addition to other pertinent events   03/05/22 admitted, started on BiPAP for acute hypercapnic respiratory failure  Interim History / Subjective:   Arousable, interactive, still feels a little tired  Objective   Blood pressure (!) 149/104, pulse 92, temperature 99.8 F (37.7 C), resp. rate 11, height '5\' 3"'$  (1.6 m), weight 58.8 kg, SpO2 98 %.        Intake/Output Summary (Last 24 hours) at 03/05/2022 1227 Last data filed at 03/05/2022 0548 Gross per 24 hour  Intake --  Output 850 ml  Net -850 ml   Filed Weights   03/05/22 0314  Weight: 58.8 kg    Examination: General appearance: Chronically ill-appearing Eyes: Pupils reacting HENT: BiPAP mask in place Lungs: Clear breath sounds CV: tachy RR, no murmur  Abdomen: Bowel sounds appreciated Extremities: No peripheral edema, warm Skin: Normal turgor and texture; no rash Neuro: follows commands, grossly nonfocal  CT head- no abnormalities noted CXR - NAI - reviewed by myself Blood cultures with no growth so far  Resolved Hospital Problem list    Assessment & Plan:   Acute metabolic encephalopathy Polysubstance abuse Suspected heroin  intoxication -CIWA -Thiamine -Supportive care  Mental status continues to improve and will wean off BiPAP as tolerated Does not appear to need Narcan at present  Acute hypoxemic respiratory failure Hypercapnic respiratory failure -Continue BiPAP and wean off as tolerated  Possible sepsis -On empiric antibiotics -We will de-escalate -Discontinue vancomycin as MRSA PCR negative  Metabolic acidosis -Continue to monitor closely  Acute kidney injury Trend CK -Fluid resuscitation -Maintain renal perfusion -Avoid nephrotoxic's  COPD - bronchodilators  Best Practice (right click and "Reselect all SmartList Selections" daily)   Diet/type: NPO DVT prophylaxis: LMWH GI prophylaxis: PPI Lines: N/A Foley:  N/A Code Status:  full code Last date of multidisciplinary goals of care discussion [Pt updated at bedside]  Labs   CBC: Recent Labs  Lab 03/05/22 0320 03/05/22 0620  WBC 20.1* 14.2*  NEUTROABS 17.3*  --   HGB 14.5 11.6*  HCT 46.1* 35.5*  MCV 99.4 93.2  PLT 251 016    Basic Metabolic Panel: Recent Labs  Lab 03/05/22 0320 03/05/22 0620  NA 140  --   K 5.1  --   CL 103  --   CO2 21*  --   GLUCOSE 340*  --   BUN 12  --   CREATININE 1.18* 0.90  CALCIUM 9.1  --    GFR: Estimated Creatinine Clearance: 58.4 mL/min (by C-G formula based on SCr of 0.9 mg/dL). Recent Labs  Lab 03/05/22 0320 03/05/22 0546 03/05/22 0620  WBC 20.1*  --  14.2*  LATICACIDVEN 7.4* 3.1*  --     Liver Function Tests: Recent Labs  Lab  03/05/22 0320  AST 42*  ALT 29  ALKPHOS 109  BILITOT 0.4  PROT 8.2*  ALBUMIN 4.6   No results for input(s): "LIPASE", "AMYLASE" in the last 168 hours. No results for input(s): "AMMONIA" in the last 168 hours.  ABG    Component Value Date/Time   PHART 7.317 (L) 01/08/2022 2305   PCO2ART 53.4 (H) 01/08/2022 2305   PO2ART 591 (H) 01/08/2022 2305   HCO3 23.3 03/05/2022 0338   TCO2 29 01/08/2022 2305   ACIDBASEDEF 7.8 (H) 03/05/2022 0338    O2SAT 34.9 03/05/2022 0338     Coagulation Profile: Recent Labs  Lab 03/05/22 0320  INR 1.4*    Cardiac Enzymes: Recent Labs  Lab 03/05/22 0546  CKTOTAL 1,917*    HbA1C: Hgb A1c MFr Bld  Date/Time Value Ref Range Status  11/30/2016 03:13 PM 6.0 4.6 - 6.5 % Final    Comment:    Glycemic Control Guidelines for People with Diabetes:Non Diabetic:  <6%Goal of Therapy: <7%Additional Action Suggested:  >8%   09/12/2008 01:25 AM  4.6 - 6.1 % Final   5.8 (NOTE)   The ADA recommends the following therapeutic goal for glycemic   control related to Hgb A1C measurement:   Goal of Therapy:   < 7.0% Hgb A1C   Reference: American Diabetes Association: Clinical Practice   Recommendations 2008, Diabetes Care,  2008, 31:(Suppl 1).    CBG: Recent Labs  Lab 03/05/22 0333 03/05/22 0813 03/05/22 0840 03/05/22 1133  GLUCAP 312* 51* 196* 85    Review of Systems:   12 point review of systems is negative except as in hpi  Past Medical History:  She,  has a past medical history of Alcoholic hepatitis, Aortic atherosclerosis ([place]), Arthritis, Carpal tunnel syndrome on both sides (02/2012), Cigarette nicotine dependence, Contact lens/glasses fitting, Dental crowns present, Elevated LFTs, Fatty liver, Fluid retention, History of MRSA infection, Hypertension, Insomnia, Migraines, Osteoarthritis, Parasomnia, and Wears partial dentures.   Surgical History:   Past Surgical History:  Procedure Laterality Date   ANKLE ARTHROSCOPY WITH RECONSTRUCTION  07/2017   BIOPSY  02/24/2018   Procedure: BIOPSY;  Surgeon: Jackquline Denmark, MD;  Location: [facility];  Service: Endoscopy;;   CARPAL TUNNEL RELEASE  03/17/2012   Procedure: CARPAL TUNNEL RELEASE;  Surgeon: Roseanne Kaufman, MD;  Location: [facility];  Service: Orthopedics;  Laterality: Bilateral;  left limited open carpal tunnel release. right carpal tunnel injection with 1cc of celestone and 2cc of marcaine.25%   CARPAL TUNNEL RELEASE   06/30/2012   Procedure: CARPAL TUNNEL RELEASE;  Surgeon: Roseanne Kaufman, MD;  Location: [facility];  Service: Orthopedics;  Laterality: Right;  RIGHT LIMITED OPEN CARPAL TUNNEL RELEASE   CARPOMETACARPAL ([facility]) FUSION OF THUMB Left 06/23/2013   Procedure: LEFT CARPOMETACARPEL ([place]) FUSION OF THUMB WITH AUTOGRAFT FROM RADIUS AND REPAIR AS NECESSARY;  Surgeon: Roseanne Kaufman, MD;  Location: [place];  Service: Orthopedics;  Laterality: Left;   COLONOSCOPY  2014   ESOPHAGOGASTRODUODENOSCOPY  05/2017   ESOPHAGOGASTRODUODENOSCOPY (EGD) WITH PROPOFOL N/A 02/24/2018   Procedure: ESOPHAGOGASTRODUODENOSCOPY (EGD) WITH PROPOFOL;  Surgeon: Jackquline Denmark, MD;  Location: [facility];  Service: Endoscopy;  Laterality: N/A;   HARDWARE REMOVAL Left 07/10/2013   Procedure: HARDWARE REMOVAL;  Surgeon: Roseanne Kaufman, MD;  Location: [facility];  Service: Orthopedics;  Laterality: Left;   INCISION AND DRAINAGE OF WOUND Left 07/10/2013   Procedure: IRRIGATION AND DEBRIDEMENT WOUND left wrist  ;  Surgeon: Roseanne Kaufman, MD;  Location: [facility];  Service:  Orthopedics;  Laterality: Left;   LAPAROSCOPIC VAGINAL HYSTERECTOMY  02/24/2010   LAPAROSCOPY  06/2010   for adhesions   LEEP     x 2   LUMBAR DISC SURGERY  03/23/2003   left L5-S1 discectomy with microdissection   LUMBAR DISC SURGERY  05/23/2003   redo discectomy L5-S1 with microdissection     Social History:   reports that she has quit smoking. She has never used smokeless tobacco. She reports current alcohol use. She reports that she does not use drugs.   Family History:  Her family history includes Colon polyps in her sister; Diabetes in her mother and sister; Hypertension in her mother and sister; Lung cancer in her maternal grandmother. There is no history of Colon cancer, Esophageal cancer, Pancreatic cancer, Stomach cancer, or Liver disease.   Allergies Allergies  Allergen Reactions   Hydroxyamine Other (See Comments)    Not effective   Pregabalin  Other (See Comments)    Angry, upset      The patient is critically ill with multiple organ systems failure and requires high complexity decision making for assessment and support, frequent evaluation and titration of therapies, application of advanced monitoring technologies and extensive interpretation of multiple databases. Critical Care Time devoted to patient care services described in this note independent of APP/resident time (if applicable)  is 30 minutes.   Sherrilyn Rist MD [place] [facility] Critical [facility] Personal pager: See Amion If unanswered, please page CCM On-call: [phone]

## 2022-03-05 NOTE — Progress Notes (Signed)
Assisted with transferring PT from [facility] ED to [facility] ICU while on BiPAP- uneventful.

## 2022-03-06 ENCOUNTER — Inpatient Hospital Stay (HOSPITAL_COMMUNITY): Payer: BLUE CROSS/BLUE SHIELD

## 2022-03-06 DIAGNOSIS — G9341 Metabolic encephalopathy: Secondary | ICD-10-CM

## 2022-03-06 DIAGNOSIS — M6282 Rhabdomyolysis: Secondary | ICD-10-CM

## 2022-03-06 DIAGNOSIS — J9602 Acute respiratory failure with hypercapnia: Secondary | ICD-10-CM | POA: Diagnosis not present

## 2022-03-06 LAB — BASIC METABOLIC PANEL
Anion gap: 8 (ref 5–15)
BUN: 6 mg/dL (ref 6–20)
CO2: 26 mmol/L (ref 22–32)
Calcium: 8.5 mg/dL — ABNORMAL LOW (ref 8.9–10.3)
Chloride: 108 mmol/L (ref 98–111)
Creatinine, Ser: 0.69 mg/dL (ref 0.44–1.00)
GFR, Estimated: >60 mL/min (ref >60)
Glucose, Bld: 99 mg/dL (ref 70–99)
Potassium: 3.7 mmol/L (ref 3.5–5.1)
Sodium: 142 mmol/L (ref 135–145)

## 2022-03-06 LAB — CBC
HCT: 35.1 % — ABNORMAL LOW (ref 36.0–46.0)
Hemoglobin: 11.5 g/dL — ABNORMAL LOW (ref 12.0–15.0)
MCH: 31.2 pg (ref 26.0–34.0)
MCHC: 32.8 g/dL (ref 30.0–36.0)
MCV: 95.1 fL (ref 80.0–100.0)
Platelets: 147 10*3/uL — ABNORMAL LOW (ref 150–400)
RBC: 3.69 MIL/uL — ABNORMAL LOW (ref 3.87–5.11)
RDW: 13.1 % (ref 11.5–15.5)
WBC: 6.5 10*3/uL (ref 4.0–10.5)
nRBC: 0 % (ref 0.0–0.2)

## 2022-03-06 LAB — GLUCOSE, CAPILLARY
Glucose-Capillary: 113 mg/dL — ABNORMAL HIGH (ref 70–99)
Glucose-Capillary: 103 mg/dL — ABNORMAL HIGH (ref 70–99)
Glucose-Capillary: 102 mg/dL — ABNORMAL HIGH (ref 70–99)
Glucose-Capillary: 134 mg/dL — ABNORMAL HIGH (ref 70–99)
Glucose-Capillary: 154 mg/dL — ABNORMAL HIGH (ref 70–99)
Glucose-Capillary: 121 mg/dL — ABNORMAL HIGH (ref 70–99)

## 2022-03-06 LAB — URINE CULTURE
Culture: NO GROWTH
Special Requests: NORMAL

## 2022-03-06 LAB — HEMOGLOBIN A1C
Hgb A1c MFr Bld: 6.1 % — ABNORMAL HIGH (ref 4.8–5.6)
Mean Plasma Glucose: 128 mg/dL

## 2022-03-06 LAB — CK
Total CK: 2636 U/L — ABNORMAL HIGH (ref 38–234)
Total CK: 2897 U/L — ABNORMAL HIGH (ref 38–234)

## 2022-03-06 LAB — AMMONIA: Ammonia: 15 umol/L (ref 9–35)

## 2022-03-06 MED ORDER — SODIUM CHLORIDE 0.9 % IV SOLN: INTRAVENOUS | Status: DC

## 2022-03-06 NOTE — Consult Note (Signed)
Neurology Consultation  Reason for Consult: left arm numbness and wrist drop Referring Physician: Dr. Lupita Leash  CC: left arm numbness and wrist drop  History is obtained from:patient and chart  HPI: Amy Villa is a 56 y.o. female with a history of ETOH use, substance abuse, esophageal varices and COPD who presented to the hospital after being found unresponsive in a seated position with her left arm behind her at a motel after using heroin.  She was found to be hypothermic with hypercapnic respiratory failure and was placed on BiPAP.  Her condition is now improved and she no longer requires BiPAP but c/o left arm numbness starting at the bicep and worsening lower in the arm and left wrist drop   ROS: A complete ROS was performed and is negative except as noted in the HPI.    Past Medical History:  Diagnosis Date   Alcoholic hepatitis    Aortic atherosclerosis (HCC)    Arthritis    neck   Carpal tunnel syndrome on both sides 02/2012   Cigarette nicotine dependence    Contact lens/glasses fitting    wears contacts or glasses   Dental crowns present    x 2 upper front   Elevated LFTs    Fatty liver    Fluid retention    History of MRSA infection    Hypertension    Insomnia    Migraines    Osteoarthritis    Parasomnia    Wears partial dentures    lower     Family History  Problem Relation Age of Onset   Diabetes Mother    Hypertension Mother    Colon polyps Sister    Hypertension Sister    Diabetes Sister    Lung cancer Maternal Grandmother    Colon cancer Neg Hx    Esophageal cancer Neg Hx    Pancreatic cancer Neg Hx    Stomach cancer Neg Hx    Liver disease Neg Hx      Social History:   reports that she has quit smoking. She has never used smokeless tobacco. She reports current alcohol use. She reports that she does not use drugs.  Medications  Current Facility-Administered Medications:    0.9 %  sodium chloride infusion, , Intravenous, Continuous, Kc,  Ramesh, MD, Last Rate: 100 mL/hr at 03/06/22 0822, New Bag at 03/06/22 0822   albuterol (PROVENTIL) (2.5 MG/3ML) 0.083% nebulizer solution 2.5 mg, 2.5 mg, Nebulization, Q4H PRN, Olalere, Adewale A, MD   Chlorhexidine Gluconate Cloth 2 % PADS 6 each, 6 each, Topical, Daily, Olalere, Adewale A, MD, 6 each at 03/05/22 1722   docusate sodium (COLACE) capsule 100 mg, 100 mg, Oral, BID PRN, Maryjane Hurter, MD   enoxaparin (LOVENOX) injection 40 mg, 40 mg, Subcutaneous, Q24H, Maryjane Hurter, MD, 40 mg at 03/06/22 0941   fluticasone furoate-vilanterol (BREO ELLIPTA) 200-25 MCG/ACT 1 puff, 1 puff, Inhalation, Daily, Olalere, Adewale A, MD, 1 puff at 24/58/09 9833   folic acid (FOLVITE) tablet 1 mg, 1 mg, Oral, Daily, Maryjane Hurter, MD, 1 mg at 03/06/22 0940   gabapentin (NEURONTIN) capsule 800 mg, 800 mg, Oral, TID, Olalere, Adewale A, MD, 800 mg at 03/06/22 0940   hydrOXYzine (ATARAX) tablet 50-100 mg, 50-100 mg, Oral, TID, Olalere, Adewale A, MD, 50 mg at 03/06/22 0940   insulin aspart (novoLOG) injection 0-15 Units, 0-15 Units, Subcutaneous, Q4H, Maryjane Hurter, MD, 2 Units at 03/06/22 1218   lamoTRIgine (LAMICTAL) tablet 25 mg, 25 mg,  Oral, Daily, Olalere, Adewale A, MD, 25 mg at 03/06/22 0940   LORazepam (ATIVAN) tablet 1-4 mg, 1-4 mg, Oral, Q1H PRN, 1 mg at 03/06/22 0940 **OR** LORazepam (ATIVAN) injection 1-4 mg, 1-4 mg, Intravenous, Q1H PRN, Maryjane Hurter, MD, 1 mg at 03/05/22 2116   multivitamin with minerals tablet 1 tablet, 1 tablet, Oral, Daily, Maryjane Hurter, MD, 1 tablet at 03/06/22 0940   nicotine (NICODERM CQ - dosed in mg/24 hours) patch 14 mg, 14 mg, Transdermal, Daily, Maryjane Hurter, MD, 14 mg at 03/06/22 0940   Oxcarbazepine (TRILEPTAL) tablet 300 mg, 300 mg, Oral, BID, Olalere, Adewale A, MD, 300 mg at 03/06/22 0941   polyethylene glycol (MIRALAX / GLYCOLAX) packet 17 g, 17 g, Oral, Daily PRN, Maryjane Hurter, MD   QUEtiapine (SEROQUEL) tablet 400 mg,  400 mg, Oral, QHS, Olalere, Adewale A, MD, 400 mg at 03/05/22 2229   thiamine (VITAMIN B1) tablet 100 mg, 100 mg, Oral, Daily, 100 mg at 03/06/22 0940 **OR** thiamine (VITAMIN B1) injection 100 mg, 100 mg, Intravenous, Daily, Maryjane Hurter, MD, 100 mg at 03/05/22 1205   tiZANidine (ZANAFLEX) tablet 4 mg, 4 mg, Oral, TID PRN, Olalere, Adewale A, MD   Exam: Current vital signs: BP (!) 145/76   Pulse 91   Temp 98.4 F (36.9 C) (Oral)   Resp 15   Ht '5\' 3"'$  (1.6 m)   Wt 58.8 kg   SpO2 90%   BMI 22.96 kg/m  Vital signs in last 24 hours: Temp:  [98.2 F (36.8 C)-98.5 F (36.9 C)] 98.4 F (36.9 C) (08/18 1143) Pulse Rate:  [84-99] 91 (08/18 0930) Resp:  [11-17] 15 (08/18 0800) BP: (116-164)/(74-105) 145/76 (08/18 0900) SpO2:  [90 %-100 %] 90 % (08/18 0800)  GENERAL: Awake, alert, in no acute distress Psych: Affect appropriate for situation, patient is calm and cooperative with examination Head: Normocephalic and atraumatic, without obvious abnormality EENT: Normal conjunctivae, moist mucous membranes, no OP obstruction LUNGS: Normal respiratory effort. Non-labored breathing on supplemental O2 Extremities: warm, well perfused, with left wrist drop  NEURO:  Mental Status: Awake, alert, and oriented to person, place, time, and situation. She is able to provide a clear and coherent history of present illness. Speech/Language: speech is clear and fluent.    No neglect is noted Cranial Nerves:  II: PERRL III, IV, VI: EOMI. Lid elevation symmetric and full.  V: Sensation is intact to light touch and symmetrical to face.  VII: Face is symmetric resting and smiling.  VIII: Hearing intact to voice IX, X: Phonation normal.  XI: Normal sternocleidomastoid and trapezius muscle strength XII: Tongue protrudes midline without fasciculations.   Motor: 5/5 strength in RUE and BLE. 4/5 strength in LUE at shoulder, 3/5 at elbow, unable to move wrist but can slightly move fingers Tone is  normal. Bulk is normal.  Sensation: Intact to light touch bilaterally in all four extremities. DTRs: 2+ patellar and right biceps and brachioradialis, absent in left bicep and brachioradialis Gait: Deferred  Labs I have reviewed labs in epic and the results pertinent to this consultation are:   CBC    Component Value Date/Time   WBC 6.5 03/06/2022 0318   RBC 3.69 (L) 03/06/2022 0318   HGB 11.5 (L) 03/06/2022 0318   HCT 35.1 (L) 03/06/2022 0318   PLT 147 (L) 03/06/2022 0318   MCV 95.1 03/06/2022 0318   MCH 31.2 03/06/2022 0318   MCHC 32.8 03/06/2022 0318   RDW 13.1 03/06/2022 0318  LYMPHSABS 1.2 03/05/2022 0320   MONOABS 1.2 (H) 03/05/2022 0320   EOSABS 0.0 03/05/2022 0320   BASOSABS 0.1 03/05/2022 0320    CMP     Component Value Date/Time   NA 142 03/06/2022 0318   K 3.7 03/06/2022 0318   CL 108 03/06/2022 0318   CO2 26 03/06/2022 0318   GLUCOSE 99 03/06/2022 0318   BUN 6 03/06/2022 0318   CREATININE 0.69 03/06/2022 0318   CALCIUM 8.5 (L) 03/06/2022 0318   PROT 8.2 (H) 03/05/2022 0320   ALBUMIN 4.6 03/05/2022 0320   AST 42 (H) 03/05/2022 0320   ALT 29 03/05/2022 0320   ALKPHOS 109 03/05/2022 0320   BILITOT 0.4 03/05/2022 0320   GFRNONAA >60 03/06/2022 0318   GFRAA >60 11/15/2019 0704    Lipid Panel     Component Value Date/Time   CHOL 179 11/30/2016 1513   TRIG 229 (H) 01/09/2022 0449   HDL 21.70 (L) 11/30/2016 1513   CHOLHDL 8 11/30/2016 1513   VLDL UNABLE TO CALCULATE IF TRIGLYCERIDE OVER 400 mg/dL 09/13/2008 0630   LDLCALC  09/13/2008 0630    UNABLE TO CALCULATE IF TRIGLYCERIDE OVER 400 mg/dL        Total Cholesterol/HDL:CHD Risk Coronary Heart Disease Risk Table                     Men   Women  1/2 Average Risk   3.4   3.3  Average Risk       5.0   4.4  2 X Average Risk   9.6   7.1  3 X Average Risk  23.4   11.0        Use the calculated Patient Ratio above and the CHD Risk Table to determine the patient's CHD Risk.        ATP III  CLASSIFICATION (LDL):  <100     mg/dL   Optimal  100-129  mg/dL   Near or Above                    Optimal  130-159  mg/dL   Borderline  160-189  mg/dL   High  >190     mg/dL   Very High   LDLDIRECT 106.0 11/30/2016 1513     Imaging I have reviewed the images obtained:  CT-scan of the brain: No acute intracranial abnormality  MRI examination of the brain: No acute abnormality  Assessment: 56 year old patient presented after being found down in a seated position with her left arm behind her after using heroin.  Now reports left wrist drop and left arm numbness.  MRI brain shows no acute abnormality.  Patient is able to move her left shoulder and can flex but not extend her elbow.  She cannot move her wrist and is able to wiggle fingers only slightly.  Still unable to move fingers much with wrist extended. Reflexes absent in left arm.  Presentation is most consistent with median nerve palsy, although MRI of C-spine to rule out cervical nerve compression given high numbness on the arm and positioning of patient when she was found down would be worthwhile.  Impression:Median nerve palsy of the left arm vs. Cervical nerve compression  Recommendations: - MRI c-spine to rule out cervical nerve compression - PT/OT consult  Pt seen by NP/Neuro and later by MD. Note/plan to be edited by MD as needed.   [place] , MSN, AGACNP-BC [facility] See Amion  for schedule and pager information 03/06/2022 12:27 PM  NEUROHOSPITALIST ADDENDUM Performed a face to face diagnostic evaluation.   I have reviewed the contents of history and physical exam as documented by PA/ARNP/Resident and agree with above documentation.  I have discussed and formulated the above plan as documented. Edits to the note have been made as needed.  Impression/Key exam findings/Plan: found passed out with left arm behind her and since has L arm numbness and weakness. The distribution of her weakness and  numbness is non dermatomal and not in a specific nerve distribution. Unlikely that this is radiculopathy. She describes it as a band around her upper arm and then everything below is numb and weak. No shoulder abduction weakness. I suspect that this is likely compressive neuropathy in her arm in the setting of being passed out.  Will get MRI C spine given hx of Degenerative disc disease. No urinary, bowel incontinence. No saddle anesthesia. No lhermittes sign. Surprisingly, her UDS is negative. She endorses smoking a little bit of herion. Endorses hx of EtOh use and has not had any alcohol over the last 5 days so outside the withdrawal window. Denies any seizure activity. She reports that she was only down for 2-3 hours but I am not sure if that is entirely true given significant hypothermia on arrival with Tmin of 93.3.  If MRI C spine is negative, recommend outpatient neurology follow up for EMG and NCS if the weakness persists. No further inpatient workup if the MRI C spine is negative.  Donnetta Simpers, MD [facility] [phone]   If 7pm to 7am, please call on call as listed on AMION.

## 2022-03-06 NOTE — Progress Notes (Signed)
PROGRESS NOTE Amy Villa  GEX:[id number] DOB: 06/02/1966 DOA: 03/05/2022 PCP: Patient, No Pcp Per   Brief Narrative/Hospital Course: 56yof with PMH of alcohol use, alcoholic cirrhosis with EV, COPD, SIMD who came in after being found unresponsive at a motel having just snorted heroin.Apparently given '8mg'$  narcan in the field. In ED,she was found to be hypothermic with acute hypercapnic respiratory failure started on BiPAP with vbg 7.1/75, lactate 7, EKG with ST depressions and QTc mildly prolonged without other interval prolongation, with mild rhabdomyolysis. Patient is admitted to ICU for acute hypercapnic respiratory failure/acute metabolic encephalopathy polysubstance abuse and suspected heroin intoxication. Patient clinically improved was back to baseline alert awake oriented x4, no longer needing BiPAP and transferred to [facility] service 03/06/22.     Subjective: Seen and examined.  Alert awake oriented.  Resting comfortably. Patient does not remember what happened prior to coming to the ED, she drinks alcohol Wednesday through Saturday and abstained Sunday through Tuesday. Complains of left wrist drop and some numbness below elbow-MRI brain was negative no other focal weakness   Assessment and Plan: Principal Problem:   Acute hypercapnic respiratory failure (HCC) Active Problems:   Alcohol abuse   Liver cirrhosis (HCC)   Anxiety and depression   Substance abuse ([place])   Alcohol abuse with alcohol-induced mood disorder (HCC)   Rhabdomyolysis   Acute metabolic encephalopathy   Acute hypercapnic respiratory failure Acute hypoxic respiratory failure: Resolved.  Doing well, respiratory status stable on supplemental nasal cannula wean as tolerated, no longer needing BiPAP  Suspected heroin intoxication Polysubstance abuse: TOC consult, substance abuse cessation counseling, alcohol cessation counseling.  Acute metabolic encephalopathy: Resolved.Multifactorial due to hypercapnia,  intoxication.  Resolved.  Continue supportive care  Left upper extremity weakness and numbness/left wrist drop: mri brian no aute finding.?  If related to prolonged immobilization/laying on the left side w/ radial nerve palsy.  We will consult neurology  Alcohol abuse: Continue thiamine, vitamin CIWA ativan,  Liver cirrhosis due to alcoholic liver disease with history of EV: Hemoglobin/platelet stable,  Mild leukocytosis on admission: No clear source of infection, afebrile.  Initially sepsis suspected, antibiotics discontinued as sepsis ruled out.  Blood culture urine culture in process.  Glucose cytosis has resolved Recent Labs  Lab 03/05/22 0320 03/05/22 0546 03/05/22 0620 03/06/22 0318  WBC 20.1*  --  14.2* 6.5  LATICACIDVEN 7.4* 3.1*  --   --     Metabolic acidosis/lactic acidosis: Monitor labs Acute kidney injury: Creatinine 1.1 on admission,nbasline 0.6-resolved Recent Labs  Lab 03/05/22 0320 03/05/22 0620 03/06/22 0318  BUN 12  --  6  CREATININE 1.18* 0.90 0.69    Alcohol abuse with alcohol-induced mood disorder Anxiety and depression: Continue Seroquel, Trileptal, Lamictal, Neurontin  Rhabdomyolysis: Continue IV fluid hydration, trend CK  DVT prophylaxis: enoxaparin (LOVENOX) injection 40 mg Start: 03/05/22 1000 Code Status:   Code Status: Full Code Family Communication: plan of care discussed with patient at bedside. Patient status is: Inpatient because of ongoing management of rhabdomyolysis left arm weakness Level of care: ICU > downgrade to progressive  Dispo: The patient is from: home w/ husband            Anticipated disposition: TBD 1-2 days  Mobility Assessment (last 72 hours)     Mobility Assessment   No documentation.            Objective: Vitals last 24 hrs: Vitals:   03/06/22 0700 03/06/22 0800 03/06/22 0900 03/06/22 0930  BP: 116/74 129/79 (!) 145/76  Pulse: 84 84 84 91  Resp: 12 15    Temp:      TempSrc:      SpO2: 94% 90%     Weight:      Height:       Weight change:   Physical Examination: General exam: alert awake,older than stated age, weak appearing. HEENT:Oral mucosa moist, Ear/Nose WNL grossly, dentition normal. Respiratory system: bilaterally diminished BS, no use of accessory muscle Cardiovascular system: S1 & S2 +, No JVD. Gastrointestinal system: Abdomen soft,NT,ND, BS+ Nervous System:Alert, awake, moving extremities,, able to move left upper arm but has weakness on wrist extension Extremities: LE edema neg,,distal peripheral pulses palpable.  Skin: No rashes,no icterus. MSK: Normal muscle bulk,tone, power  Medications reviewed:  Scheduled Meds:  Chlorhexidine Gluconate Cloth  6 each Topical Daily   enoxaparin (LOVENOX) injection  40 mg Subcutaneous Q24H   fluticasone furoate-vilanterol  1 puff Inhalation Daily   folic acid  1 mg Oral Daily   gabapentin  800 mg Oral TID   hydrOXYzine  50-100 mg Oral TID   insulin aspart  0-15 Units Subcutaneous Q4H   lamoTRIgine  25 mg Oral Daily   multivitamin with minerals  1 tablet Oral Daily   nicotine  14 mg Transdermal Daily   Oxcarbazepine  300 mg Oral BID   QUEtiapine  400 mg Oral QHS   thiamine  100 mg Oral Daily   Or   thiamine  100 mg Intravenous Daily   Continuous Infusions:  sodium chloride 100 mL/hr at 03/06/22 6812      Diet Order             Diet regular Room service appropriate? Yes; Fluid consistency: Thin  Diet effective now                            Intake/Output Summary (Last 24 hours) at 03/06/2022 1042 Last data filed at 03/06/2022 7517 Gross per 24 hour  Intake 2588.83 ml  Output 2950 ml  Net -361.17 ml   Net IO Since Admission: -1,211.17 mL [03/06/22 1042]  Wt Readings from Last 3 Encounters:  03/05/22 58.8 kg  01/12/22 58.8 kg  11/15/19 63.7 kg     Unresulted Labs (From admission, onward)     Start     Ordered   03/12/22 0500  Creatinine, serum  (enoxaparin (LOVENOX)    CrCl >/= 30 ml/min)   Weekly,   R     Comments: while on enoxaparin therapy    03/05/22 0550   03/06/22 1700  CK  Now then every 12 hours,   R     Question:  Specimen collection method  Answer:  Lab=Lab collect   03/06/22 0733          Data Reviewed: I have personally reviewed following labs and imaging studies CBC: Recent Labs  Lab 03/05/22 0320 03/05/22 0620 03/06/22 0318  WBC 20.1* 14.2* 6.5  NEUTROABS 17.3*  --   --   HGB 14.5 11.6* 11.5*  HCT 46.1* 35.5* 35.1*  MCV 99.4 93.2 95.1  PLT 251 196 001*   Basic Metabolic Panel: Recent Labs  Lab 03/05/22 0320 03/05/22 0620 03/06/22 0318  NA 140  --  142  K 5.1  --  3.7  CL 103  --  108  CO2 21*  --  26  GLUCOSE 340*  --  99  BUN 12  --  6  CREATININE 1.18* 0.90 0.69  CALCIUM 9.1  --  8.5*   GFR: Estimated Creatinine Clearance: 65.7 mL/min (by C-G formula based on SCr of 0.69 mg/dL). Liver Function Tests: Recent Labs  Lab 03/05/22 0320  AST 42*  ALT 29  ALKPHOS 109  BILITOT 0.4  PROT 8.2*  ALBUMIN 4.6   No results for input(s): "LIPASE", "AMYLASE" in the last 168 hours. Recent Labs  Lab 03/06/22 0318  AMMONIA 15   Coagulation Profile: Recent Labs  Lab 03/05/22 0320  INR 1.4*   BNP (last 3 results) No results for input(s): "PROBNP" in the last 8760 hours. HbA1C: Recent Labs    03/05/22 0320  HGBA1C 6.1*   CBG: Recent Labs  Lab 03/05/22 1555 03/05/22 1937 03/05/22 2256 03/06/22 0323 03/06/22 0741  GLUCAP 80 99 113* 103* 102*   Lipid Profile: No results for input(s): "CHOL", "HDL", "LDLCALC", "TRIG", "CHOLHDL", "LDLDIRECT" in the last 72 hours. Thyroid Function Tests: No results for input(s): "TSH", "T4TOTAL", "FREET4", "T3FREE", "THYROIDAB" in the last 72 hours. Sepsis Labs: Recent Labs  Lab 03/05/22 0320 03/05/22 0546  LATICACIDVEN 7.4* 3.1*    Recent Results (from the past 240 hour(s))  Blood Culture (routine x 2)     Status: None (Preliminary result)   Collection Time: 03/05/22  3:16 AM    Specimen: BLOOD  Result Value Ref Range Status   Specimen Description   Final    BLOOD SITE NOT SPECIFIED Performed at [facility] [address] [place], [place] [address]    Special Requests   Final    BOTTLES DRAWN AEROBIC AND ANAEROBIC Blood Culture results may not be optimal due to an inadequate volume of blood received in culture bottles Performed at [facility] [address] [place], [place] [address]    Culture   Final    NO GROWTH 1 DAY Performed at [facility], [place] [address] [place], [place] [address]    Report Status PENDING  Incomplete  SARS Coronavirus 2 by RT PCR (hospital order, performed in [facility] hospital lab) *cepheid single result test* Anterior Nasal Swab     Status: None   Collection Time: 03/05/22  3:41 AM   Specimen: Anterior Nasal Swab  Result Value Ref Range Status   SARS Coronavirus 2 by RT PCR NEGATIVE NEGATIVE Final    Comment: (NOTE) SARS-CoV-2 target nucleic acids are NOT DETECTED.  The SARS-CoV-2 RNA is generally detectable in upper and lower respiratory specimens during the acute phase of infection. The lowest concentration of SARS-CoV-2 viral copies this assay can detect is 250 copies / mL. A negative result does not preclude SARS-CoV-2 infection and should not be used as the sole basis for treatment or other patient management decisions.  A negative result may occur with improper specimen collection / handling, submission of specimen other than nasopharyngeal swab, presence of viral mutation(s) within the areas targeted by this assay, and inadequate number of viral copies (<250 copies / mL). A negative result must be combined with clinical observations, patient history, and epidemiological information.  Fact Sheet for Patients:   https://www.patel.info/  Fact Sheet for Healthcare Providers: https://hall.com/  This test is not yet approved or   cleared by the [place] FDA and has been authorized for detection and/or diagnosis of SARS-CoV-2 by FDA under an Emergency Use Authorization (EUA).  This EUA will remain in effect (meaning this test can be used) for the duration of the COVID-19 declaration under Section 564(b)(1) of the Act, 21 U.S.C. section  360bbb-3(b)(1), unless the authorization is terminated or revoked sooner.  Performed at [facility], [place] [address], [place], [place] [address]   Resp Panel by RT-PCR (Flu A&B, Covid) Anterior Nasal Swab     Status: None   Collection Time: 03/05/22  3:41 AM   Specimen: Anterior Nasal Swab  Result Value Ref Range Status   SARS Coronavirus 2 by RT PCR NEGATIVE NEGATIVE Final    Comment: (NOTE) SARS-CoV-2 target nucleic acids are NOT DETECTED.  The SARS-CoV-2 RNA is generally detectable in upper respiratory specimens during the acute phase of infection. The lowest concentration of SARS-CoV-2 viral copies this assay can detect is 138 copies/mL. A negative result does not preclude SARS-Cov-2 infection and should not be used as the sole basis for treatment or other patient management decisions. A negative result may occur with  improper specimen collection/handling, submission of specimen other than nasopharyngeal swab, presence of viral mutation(s) within the areas targeted by this assay, and inadequate number of viral copies(<138 copies/mL). A negative result must be combined with clinical observations, patient history, and epidemiological information. The expected result is Negative.  Fact Sheet for Patients:  EntrepreneurPulse.com.au  Fact Sheet for Healthcare Providers:  IncredibleEmployment.be  This test is no t yet approved or cleared by the [place] FDA and  has been authorized for detection and/or diagnosis of SARS-CoV-2 by FDA under an Emergency Use Authorization (EUA). This EUA will remain  in effect  (meaning this test can be used) for the duration of the COVID-19 declaration under Section 564(b)(1) of the Act, 21 U.S.C.section 360bbb-3(b)(1), unless the authorization is terminated  or revoked sooner.       Influenza A by PCR NEGATIVE NEGATIVE Final   Influenza B by PCR NEGATIVE NEGATIVE Final    Comment: (NOTE) The Xpert Xpress SARS-CoV-2/FLU/RSV plus assay is intended as an aid in the diagnosis of influenza from Nasopharyngeal swab specimens and should not be used as a sole basis for treatment. Nasal washings and aspirates are unacceptable for Xpert Xpress SARS-CoV-2/FLU/RSV testing.  Fact Sheet for Patients: EntrepreneurPulse.com.au  Fact Sheet for Healthcare Providers: IncredibleEmployment.be  This test is not yet approved or cleared by the [place] FDA and has been authorized for detection and/or diagnosis of SARS-CoV-2 by FDA under an Emergency Use Authorization (EUA). This EUA will remain in effect (meaning this test can be used) for the duration of the COVID-19 declaration under Section 564(b)(1) of the Act, 21 U.S.C. section 360bbb-3(b)(1), unless the authorization is terminated or revoked.  Performed at [facility], [place] [address], [place], [place] [address]   Urine Culture     Status: None   Collection Time: 03/05/22  5:46 AM   Specimen: In/Out Cath Urine  Result Value Ref Range Status   Specimen Description   Final    IN/OUT CATH URINE Performed at [facility] [address], [place], [place] [address]    Special Requests   Final    Normal Performed at [facility], [place] [address] [place], [place] [address]    Culture   Final    NO GROWTH Performed at [facility], [place] [address], [place], [place] [address]    Report Status 03/06/2022 FINAL  Final  MRSA Next Gen by PCR, Nasal     Status: None   Collection Time: 03/05/22  6:20 AM   Specimen:  Nasal Mucosa; Nasal Swab  Result Value Ref Range Status   MRSA by PCR  Next Gen NOT DETECTED NOT DETECTED Final    Comment: (NOTE) The GeneXpert MRSA Assay (FDA approved for NASAL specimens only), is one component of a comprehensive MRSA colonization surveillance program. It is not intended to diagnose MRSA infection nor to guide or monitor treatment for MRSA infections. Test performance is not FDA approved in patients less than 50 years old. Performed at [facility], [place] [address] [place], [place] [address]   Blood Culture (routine x 2)     Status: None (Preliminary result)   Collection Time: 03/05/22 11:22 AM   Specimen: BLOOD  Result Value Ref Range Status   Specimen Description   Final    BLOOD BLOOD RIGHT HAND Performed at [facility] [address], [place], [place] [address]    Special Requests   Final    BOTTLES DRAWN AEROBIC ONLY Blood Culture adequate volume Performed at [facility] [address] [place], [place] [address]    Culture   Final    NO GROWTH < 24 HOURS Performed at [facility] [address], [place], [place] [address]    Report Status PENDING  Incomplete    Antimicrobials: Anti-infectives (From admission, onward)    Start     Dose/Rate Route Frequency Ordered Stop   03/05/22 0345  ceFEPIme (MAXIPIME) 2 g in sodium chloride 0.9 % 100 mL IVPB        2 g 200 mL/hr over 30 Minutes Intravenous  Once 03/05/22 0332 03/05/22 0410   03/05/22 0345  metroNIDAZOLE (FLAGYL) IVPB 500 mg        500 mg 100 mL/hr over 60 Minutes Intravenous  Once 03/05/22 0332 03/05/22 0534   03/05/22 0345  vancomycin (VANCOCIN) IVPB 1000 mg/200 mL premix        1,000 mg 200 mL/hr over 60 Minutes Intravenous  Once 03/05/22 0332 03/05/22 0534      Culture/Microbiology    Component Value Date/Time   SDES  03/05/2022 1122    BLOOD BLOOD RIGHT HAND Performed at [facility], [place]  [address], [place], [place] [address]    SPECREQUEST  03/05/2022 1122    BOTTLES DRAWN AEROBIC ONLY Blood Culture adequate volume Performed at [facility] [address], [place], [place] [address]    CULT  03/05/2022 1122    NO GROWTH < 24 HOURS Performed at [facility] [address], [place], [place] [address]    REPTSTATUS PENDING 03/05/2022 1122    Other culture-see note  Radiology Studies: MR BRAIN W WO CONTRAST  Result Date: 03/05/2022 CLINICAL DATA:  Acute neurologic deficit EXAM: MRI HEAD WITHOUT AND WITH CONTRAST TECHNIQUE: Multiplanar, multiecho pulse sequences of the brain and surrounding structures were obtained without and with intravenous contrast. CONTRAST:  25m GADAVIST GADOBUTROL 1 MMOL/ML IV SOLN COMPARISON:  None Available. FINDINGS: Brain: No acute infarct, mass effect or extra-axial collection. No acute or chronic hemorrhage. Normal white matter signal, parenchymal volume and CSF spaces. The midline structures are normal. Vascular: Major flow voids are preserved. Skull and upper cervical spine: Normal calvarium and skull base. Visualized upper cervical spine and soft tissues are normal. Sinuses/Orbits:No paranasal sinus fluid levels or advanced mucosal thickening. No mastoid or middle ear effusion. Normal orbits. IMPRESSION: Normal brain MRI. Electronically Signed   By: KUlyses JarredM.D.   On: 03/05/2022 22:27   CT HEAD WO CONTRAST (5MM)  Result Date: 03/05/2022 CLINICAL DATA:  Overdose with altered mental status.  EXAM: CT HEAD WITHOUT CONTRAST TECHNIQUE: Contiguous axial images were obtained from the base of the skull through the vertex without intravenous contrast. RADIATION DOSE REDUCTION: This exam was performed according to the departmental dose-optimization program which includes automated exposure control, adjustment of the mA and/or kV according to patient size and/or use of iterative reconstruction technique. COMPARISON:  January 08, 2022  FINDINGS: Brain: No evidence of acute infarction, hemorrhage, hydrocephalus, extra-axial collection or mass lesion/mass effect. Vascular: No hyperdense vessel or unexpected calcification. Skull: Normal. Negative for fracture or focal lesion. Sinuses/Orbits: No acute finding. Other: Mild right frontal scalp soft tissue swelling is noted. IMPRESSION: 1. No acute intracranial abnormality. Electronically Signed   By: Virgina Norfolk M.D.   On: 03/05/2022 04:19   DG Chest Port 1 View  Result Date: 03/05/2022 CLINICAL DATA:  Possible sepsis, question overdose EXAM: PORTABLE CHEST 1 VIEW COMPARISON:  01/09/2022 FINDINGS: Cardiac shadow is within normal limits. The lungs are clear bilaterally. No free air is noted in the upper abdomen. No bony abnormality is seen. IMPRESSION: No acute abnormality noted. Electronically Signed   By: Inez Catalina M.D.   On: 03/05/2022 03:36     LOS: 1 day   Antonieta Pert, MD [facility]  03/06/2022, 10:42 AM

## 2022-03-06 NOTE — Hospital Course (Addendum)
Amy Villa isa  56 y.o. F with hx depression, alcoholic cirrhosis, COPD not on home O2, HTN, and polysubstance abuse who presented after being found unresponsive at a motel having just snorted heroin.  Apparently given 8 mg narcan in the field.   8/17: In the ER, hypothermic and acidotic.  Started on BiPAP and admitted to ICU 8/18: Transferred [facility], Neurology consulted for left wrist drop 8/20: CT chest shows new lytic sternal lesion

## 2022-03-06 NOTE — Plan of Care (Signed)
  Problem: Education: Goal: Knowledge of General Education information will improve Description: Including pain rating scale, medication(s)/side effects and non-pharmacologic comfort measures Outcome: Progressing   Problem: Clinical Measurements: Goal: Will remain free from infection Outcome: Progressing   Problem: Safety: Goal: Ability to remain free from injury will improve Outcome: Progressing   

## 2022-03-06 NOTE — Progress Notes (Signed)
Pt is Aox4. Pt states she does not remember what happened prior to coming to ED. Pt admits to occasional heroin use when her son stops by with some. Pt says her son is a frequent heroin user. She says she does not leave the house much due to anxiety and has in the past been agoraphobic. She states that she and her husband drink alcohol Wednesday through Saturday and abstain Sunday through Tuesday. She states she knows she shouldn't be drinking and wants to quit entirely, but to keep themselves from overdoing it, they abstain for 3 days each week.

## 2022-03-06 NOTE — Progress Notes (Signed)
Pt is resting well at this time. Bipap is not needed. No resp distress noted.

## 2022-03-07 ENCOUNTER — Inpatient Hospital Stay (HOSPITAL_COMMUNITY): Payer: BLUE CROSS/BLUE SHIELD

## 2022-03-07 DIAGNOSIS — J9601 Acute respiratory failure with hypoxia: Secondary | ICD-10-CM

## 2022-03-07 LAB — BASIC METABOLIC PANEL
Anion gap: 5 (ref 5–15)
BUN: 5 mg/dL — ABNORMAL LOW (ref 6–20)
CO2: 27 mmol/L (ref 22–32)
Calcium: 8.3 mg/dL — ABNORMAL LOW (ref 8.9–10.3)
Chloride: 108 mmol/L (ref 98–111)
Creatinine, Ser: 0.62 mg/dL (ref 0.44–1.00)
GFR, Estimated: >60 mL/min (ref >60)
Glucose, Bld: 106 mg/dL — ABNORMAL HIGH (ref 70–99)
Potassium: 3.3 mmol/L — ABNORMAL LOW (ref 3.5–5.1)
Sodium: 140 mmol/L (ref 135–145)

## 2022-03-07 LAB — CBC
HCT: 34.8 % — ABNORMAL LOW (ref 36.0–46.0)
Hemoglobin: 11.5 g/dL — ABNORMAL LOW (ref 12.0–15.0)
MCH: 30.8 pg (ref 26.0–34.0)
MCHC: 33 g/dL (ref 30.0–36.0)
MCV: 93.3 fL (ref 80.0–100.0)
Platelets: 146 10*3/uL — ABNORMAL LOW (ref 150–400)
RBC: 3.73 MIL/uL — ABNORMAL LOW (ref 3.87–5.11)
RDW: 12.9 % (ref 11.5–15.5)
WBC: 7.7 10*3/uL (ref 4.0–10.5)
nRBC: 0 % (ref 0.0–0.2)

## 2022-03-07 LAB — CK
Total CK: 2353 U/L — ABNORMAL HIGH (ref 38–234)
Total CK: 2221 U/L — ABNORMAL HIGH (ref 38–234)

## 2022-03-07 LAB — GLUCOSE, CAPILLARY
Glucose-Capillary: 102 mg/dL — ABNORMAL HIGH (ref 70–99)
Glucose-Capillary: 105 mg/dL — ABNORMAL HIGH (ref 70–99)
Glucose-Capillary: 108 mg/dL — ABNORMAL HIGH (ref 70–99)
Glucose-Capillary: 114 mg/dL — ABNORMAL HIGH (ref 70–99)
Glucose-Capillary: 122 mg/dL — ABNORMAL HIGH (ref 70–99)
Glucose-Capillary: 189 mg/dL — ABNORMAL HIGH (ref 70–99)

## 2022-03-07 LAB — URINALYSIS, ROUTINE W REFLEX MICROSCOPIC
Bilirubin Urine: NEGATIVE
Glucose, UA: NEGATIVE mg/dL
Hgb urine dipstick: NEGATIVE
Ketones, ur: NEGATIVE mg/dL
Leukocytes,Ua: NEGATIVE
Nitrite: NEGATIVE
Protein, ur: NEGATIVE mg/dL
Specific Gravity, Urine: 1.005 (ref 1.005–1.030)
pH: 8 (ref 5.0–8.0)

## 2022-03-07 LAB — PROCALCITONIN: Procalcitonin: 0.25 ng/mL

## 2022-03-07 MED ORDER — SODIUM CHLORIDE 0.9 % IV SOLN
2.0000 g | INTRAVENOUS | Status: DC
  Administered 2022-03-07 – 2022-03-08 (×2): 2 g via INTRAVENOUS
  Filled 2022-03-07 (×2): qty 20

## 2022-03-07 MED ORDER — SODIUM CHLORIDE 0.9 % IV SOLN
500.0000 mg | INTRAVENOUS | Status: DC
  Administered 2022-03-07 – 2022-03-08 (×2): 500 mg via INTRAVENOUS
  Filled 2022-03-07 (×3): qty 5

## 2022-03-07 MED ORDER — POTASSIUM CHLORIDE CRYS ER 20 MEQ PO TBCR
40.0000 meq | EXTENDED_RELEASE_TABLET | Freq: Once | ORAL | Status: AC
  Administered 2022-03-07: 40 meq via ORAL
  Filled 2022-03-07: qty 2

## 2022-03-07 NOTE — Progress Notes (Signed)
PROGRESS NOTE Amy Villa  QHU:[id number] DOB: 1965-10-05 DOA: 03/05/2022 PCP: Patient, No Pcp Per   Brief Narrative/Hospital Course: 56yof with PMH of alcohol use, alcoholic cirrhosis with EV, COPD, SIMD who came in after being found unresponsive at a motel having just snorted heroin.Apparently given '8mg'$  narcan in the field. In ED,she was found to be hypothermic with acute hypercapnic respiratory failure started on BiPAP with vbg 7.1/75, lactate 7, EKG with ST depressions and QTc mildly prolonged without other interval prolongation, with mild rhabdomyolysis. Patient is admitted to ICU for acute hypercapnic respiratory failure/acute metabolic encephalopathy polysubstance abuse and suspected heroin intoxication. Patient clinically improved was back to baseline alert awake oriented x4, no longer needing BiPAP and transferred to [facility] service 03/06/22.   Subjective: Seen examined C/o mild cough,Overnight low-grade temperature, needing 2l [place] still. Left wrist drop still +  Labs with mild hypokalemia, CK downtrending, CBC stable  Assessment and Plan: Principal Problem:   Acute hypercapnic respiratory failure (HCC) Active Problems:   Alcohol abuse   Liver cirrhosis (HCC)   Anxiety and depression   Substance abuse ([place])   Alcohol abuse with alcohol-induced mood disorder (HCC)   Rhabdomyolysis   Acute metabolic encephalopathy   Acute hypercapnic respiratory failure Acute hypoxic respiratory failure: Doing well overall, respiratory status stable-not needing BiPAP, but still on 2L nasal cannula.Wean to room air as tolerated, ambulate , will request PT OT  Suspected heroin intoxication Polysubstance abuse: TOC consulted, substance abuse cessation counseling, alcohol cessation counseling.  Continue supportive care  Acute metabolic encephalopathy: mental status is stable improved to baseline, likely multifactorial due to hypercapnia, intoxication.Continue supportive care  Left upper  extremity weakness and numbness/left wrist drop: mri brian no aute finding. likely related to prolonged immobilization/laying on the left side w/ compression radial nerve palsy.  Seen by neurology MRI C-spine :" C5-C6 mild spinal canal stenosis with moderate left and mild right neural foraminal narrowing. 2. C4-C5 moderate to severe bilateral neural foraminal narrowing.C3-C4 moderate to severe right and moderate left neural foraminal narrowing.Normal spinal cord signal and morphology."  Discussed with neurology-MRI findings are not impressive , advised outpatient neurology follow-up for EMG. continue PT OT  Rhabdomyolysis: Likely from prolonged immobilization downtrending, continue IV fluid hydration, trend CK  Alcohol abuse: Continue thiamine, vitamin CIWA ativan.  Cessation counseling  Liver cirrhosis due to alcoholic liver disease with history of EV: Hemoglobin/platelet stable.  Supportive care outpatient GI follow-up.  LFTs relatively stable Recent Labs  Lab 03/05/22 0320  AST 42*  ALT 29  ALKPHOS 109  BILITOT 0.4  PROT 8.2*  ALBUMIN 4.6  INR 1.4*     Mild leukocytosis on admission: No clear source of infection, afebrile.  Initially sepsis suspected, antibiotics discontinued as sepsis ruled out.  Blood culture urine culture no growth so far.  Leukocytosis resolved.  Low-grade temp overnight will check UA and chest x-ray  Recent Labs  Lab 03/05/22 0320 03/05/22 0546 03/05/22 0620 03/06/22 0318 03/07/22 0457  WBC 20.1*  --  14.2* 6.5 7.7  LATICACIDVEN 7.4* 3.1*  --   --   --      Metabolic acidosis/lactic acidosis: Labs improved Mild hypokalemia replete Acute kidney injury: Creatinine 1.1 on admission,basline 0.6-resolved Recent Labs  Lab 03/05/22 0320 03/05/22 0620 03/06/22 0318 03/07/22 0457  BUN 12  --  6 5*  CREATININE 1.18* 0.90 0.69 0.62    Prediabetes with HbA1c 6.1 Hyperglycemia 340 on admission blood sugar stable  Recent Labs  Lab 03/05/22 0320  03/05/22 0333 03/06/22  [place] 03/06/22 1622 03/06/22 1945 03/06/22 2359 03/07/22 0402  GLUCAP  --    < > 134* 154* 121* 108* 114*  HGBA1C 6.1*  --   --   --   --   --   --    < > = values in this interval not displayed.    Alcohol abuse with alcohol-induced mood disorder Anxiety and depression: Continue Seroquel, Trileptal, Lamictal, Neurontin  DVT prophylaxis: enoxaparin (LOVENOX) injection 40 mg Start: 03/05/22 1000 Code Status:   Code Status: Full Code Family Communication: plan of care discussed with patient at bedside. Patient status is: Inpatient because of ongoing management of rhabdomyolysis left arm weakness Level of care: Telemetry  Dispo: The patient is from: home w/ husband            Anticipated disposition: TBD 1-2 days.  PT OT consulted  Mobility Assessment (last 72 hours)     Mobility Assessment     Row Name 03/06/22 1400 03/06/22 1358         Does patient have an order for bedrest or is patient medically unstable No - Continue assessment No - Continue assessment      What is the highest level of mobility based on the progressive mobility assessment? Level 5 (Walks with assist in room/hall) - Balance while stepping forward/back and can walk in room with assist - Complete Level 5 (Walks with assist in room/hall) - Balance while stepping forward/back and can walk in room with assist - Complete                Objective: Vitals last 24 hrs: Vitals:   03/06/22 1858 03/06/22 2152 03/07/22 0006 03/07/22 0438  BP: (!) 167/95 132/84 102/68 126/70  Pulse: (!) 109 (!) 113 99 97  Resp: '20 19 19   '$ Temp: (!) 100.8 F (38.2 C)  98.9 F (37.2 C) 99 F (37.2 C)  TempSrc: Oral  Oral Oral  SpO2: 90% 92% 96% 98%  Weight:      Height:       Weight change:   Physical Examination: General exam: AAox3, older than stated age, weak appearing. HEENT:Oral mucosa moist, Ear/Nose WNL grossly, dentition normal. Respiratory system: bilaterally clear, no use of accessory  muscle Cardiovascular system: S1 & S2 +, No JVD,. Gastrointestinal system: Abdomen soft,NT,ND,BS+ Nervous System:Alert, awake, moving extremities and grossly nonfocal Extremities: Left wrist drop- abel to move fingers some, LUE 4/5 strength with decreased sensation on distal LUE, LE ankle edema neg, distal peripheral pulses palpable.  Skin: No rashes,no icterus. MSK: Normal muscle bulk,tone, power   Medications reviewed:  Scheduled Meds:  Chlorhexidine Gluconate Cloth  6 each Topical Daily   enoxaparin (LOVENOX) injection  40 mg Subcutaneous Q24H   fluticasone furoate-vilanterol  1 puff Inhalation Daily   folic acid  1 mg Oral Daily   gabapentin  800 mg Oral TID   hydrOXYzine  50-100 mg Oral TID   insulin aspart  0-15 Units Subcutaneous Q4H   lamoTRIgine  25 mg Oral Daily   multivitamin with minerals  1 tablet Oral Daily   nicotine  14 mg Transdermal Daily   Oxcarbazepine  300 mg Oral BID   QUEtiapine  400 mg Oral QHS   thiamine  100 mg Oral Daily   Or   thiamine  100 mg Intravenous Daily   Continuous Infusions:  sodium chloride 100 mL/hr at 03/06/22 1716      Diet Order  Diet regular Room service appropriate? Yes; Fluid consistency: Thin  Diet effective now                            Intake/Output Summary (Last 24 hours) at 03/07/2022 0715 Last data filed at 03/06/2022 1800 Gross per 24 hour  Intake 1562.73 ml  Output 1725 ml  Net -162.27 ml    Net IO Since Admission: 76.56 mL [03/07/22 0715]  Wt Readings from Last 3 Encounters:  03/05/22 58.8 kg  01/12/22 58.8 kg  11/15/19 63.7 kg     Unresulted Labs (From admission, onward)     Start     Ordered   03/12/22 0500  Creatinine, serum  (enoxaparin (LOVENOX)    CrCl >/= 30 ml/min)  Weekly,   R     Comments: while on enoxaparin therapy    03/05/22 0550   03/06/22 1700  CK  Now then every 12 hours,   R (with TIMED occurrences)     Question:  Specimen collection method  Answer:  Lab=Lab  collect   03/06/22 0733          Data Reviewed: I have personally reviewed following labs and imaging studies CBC: Recent Labs  Lab 03/05/22 0320 03/05/22 0620 03/06/22 0318 03/07/22 0457  WBC 20.1* 14.2* 6.5 7.7  NEUTROABS 17.3*  --   --   --   HGB 14.5 11.6* 11.5* 11.5*  HCT 46.1* 35.5* 35.1* 34.8*  MCV 99.4 93.2 95.1 93.3  PLT 251 196 147* 146*    Basic Metabolic Panel: Recent Labs  Lab 03/05/22 0320 03/05/22 0620 03/06/22 0318 03/07/22 0457  NA 140  --  142 140  K 5.1  --  3.7 3.3*  CL 103  --  108 108  CO2 21*  --  26 27  GLUCOSE 340*  --  99 106*  BUN 12  --  6 5*  CREATININE 1.18* 0.90 0.69 0.62  CALCIUM 9.1  --  8.5* 8.3*    GFR: Estimated Creatinine Clearance: 65.7 mL/min (by C-G formula based on SCr of 0.62 mg/dL). Liver Function Tests: Recent Labs  Lab 03/05/22 0320  AST 42*  ALT 29  ALKPHOS 109  BILITOT 0.4  PROT 8.2*  ALBUMIN 4.6    No results for input(s): "LIPASE", "AMYLASE" in the last 168 hours. Recent Labs  Lab 03/06/22 0318  AMMONIA 15    Coagulation Profile: Recent Labs  Lab 03/05/22 0320  INR 1.4*    BNP (last 3 results) No results for input(s): "PROBNP" in the last 8760 hours. HbA1C: Recent Labs    03/05/22 0320  HGBA1C 6.1*    CBG: Recent Labs  Lab 03/06/22 1142 03/06/22 1622 03/06/22 1945 03/06/22 2359 03/07/22 0402  GLUCAP 134* 154* 121* 108* 114*    Lipid Profile: No results for input(s): "CHOL", "HDL", "LDLCALC", "TRIG", "CHOLHDL", "LDLDIRECT" in the last 72 hours. Thyroid Function Tests: No results for input(s): "TSH", "T4TOTAL", "FREET4", "T3FREE", "THYROIDAB" in the last 72 hours. Sepsis Labs: Recent Labs  Lab 03/05/22 0320 03/05/22 0546  LATICACIDVEN 7.4* 3.1*     Recent Results (from the past 240 hour(s))  Blood Culture (routine x 2)     Status: None (Preliminary result)   Collection Time: 03/05/22  3:16 AM   Specimen: BLOOD  Result Value Ref Range Status   Specimen Description    Final    BLOOD SITE NOT SPECIFIED Performed at [facility]  [address] [place], [place] [address]    Special Requests   Final    BOTTLES DRAWN AEROBIC AND ANAEROBIC Blood Culture results may not be optimal due to an inadequate volume of blood received in culture bottles Performed at [facility] [address], [place], [place] [address]    Culture   Final    NO GROWTH 2 DAYS Performed at [facility] [address] [place], [place] [address]    Report Status PENDING  Incomplete  SARS Coronavirus 2 by RT PCR (hospital order, performed in [facility] hospital lab) *cepheid single result test* Anterior Nasal Swab     Status: None   Collection Time: 03/05/22  3:41 AM   Specimen: Anterior Nasal Swab  Result Value Ref Range Status   SARS Coronavirus 2 by RT PCR NEGATIVE NEGATIVE Final    Comment: (NOTE) SARS-CoV-2 target nucleic acids are NOT DETECTED.  The SARS-CoV-2 RNA is generally detectable in upper and lower respiratory specimens during the acute phase of infection. The lowest concentration of SARS-CoV-2 viral copies this assay can detect is 250 copies / mL. A negative result does not preclude SARS-CoV-2 infection and should not be used as the sole basis for treatment or other patient management decisions.  A negative result may occur with improper specimen collection / handling, submission of specimen other than nasopharyngeal swab, presence of viral mutation(s) within the areas targeted by this assay, and inadequate number of viral copies (<250 copies / mL). A negative result must be combined with clinical observations, patient history, and epidemiological information.  Fact Sheet for Patients:   https://www.patel.info/  Fact Sheet for Healthcare Providers: https://hall.com/  This test is not yet approved or  cleared by the [place] FDA and has been authorized for detection  and/or diagnosis of SARS-CoV-2 by FDA under an Emergency Use Authorization (EUA).  This EUA will remain in effect (meaning this test can be used) for the duration of the COVID-19 declaration under Section 564(b)(1) of the Act, 21 U.S.C. section 360bbb-3(b)(1), unless the authorization is terminated or revoked sooner.  Performed at [facility], [place] [address] [place], [place] [address]   Resp Panel by RT-PCR (Flu A&B, Covid) Anterior Nasal Swab     Status: None   Collection Time: 03/05/22  3:41 AM   Specimen: Anterior Nasal Swab  Result Value Ref Range Status   SARS Coronavirus 2 by RT PCR NEGATIVE NEGATIVE Final    Comment: (NOTE) SARS-CoV-2 target nucleic acids are NOT DETECTED.  The SARS-CoV-2 RNA is generally detectable in upper respiratory specimens during the acute phase of infection. The lowest concentration of SARS-CoV-2 viral copies this assay can detect is 138 copies/mL. A negative result does not preclude SARS-Cov-2 infection and should not be used as the sole basis for treatment or other patient management decisions. A negative result may occur with  improper specimen collection/handling, submission of specimen other than nasopharyngeal swab, presence of viral mutation(s) within the areas targeted by this assay, and inadequate number of viral copies(<138 copies/mL). A negative result must be combined with clinical observations, patient history, and epidemiological information. The expected result is Negative.  Fact Sheet for Patients:  EntrepreneurPulse.com.au  Fact Sheet for Healthcare Providers:  IncredibleEmployment.be  This test is no t yet approved or cleared by the [place] FDA and  has been authorized for detection and/or diagnosis of SARS-CoV-2 by FDA under an Emergency Use Authorization (EUA). This EUA will remain  in effect (meaning this test  can be used) for the duration of the COVID-19  declaration under Section 564(b)(1) of the Act, 21 U.S.C.section 360bbb-3(b)(1), unless the authorization is terminated  or revoked sooner.       Influenza A by PCR NEGATIVE NEGATIVE Final   Influenza B by PCR NEGATIVE NEGATIVE Final    Comment: (NOTE) The Xpert Xpress SARS-CoV-2/FLU/RSV plus assay is intended as an aid in the diagnosis of influenza from Nasopharyngeal swab specimens and should not be used as a sole basis for treatment. Nasal washings and aspirates are unacceptable for Xpert Xpress SARS-CoV-2/FLU/RSV testing.  Fact Sheet for Patients: EntrepreneurPulse.com.au  Fact Sheet for Healthcare Providers: IncredibleEmployment.be  This test is not yet approved or cleared by the [place] FDA and has been authorized for detection and/or diagnosis of SARS-CoV-2 by FDA under an Emergency Use Authorization (EUA). This EUA will remain in effect (meaning this test can be used) for the duration of the COVID-19 declaration under Section 564(b)(1) of the Act, 21 U.S.C. section 360bbb-3(b)(1), unless the authorization is terminated or revoked.  Performed at [facility], [place] [address], [place], [place] [address]   Urine Culture     Status: None   Collection Time: 03/05/22  5:46 AM   Specimen: In/Out Cath Urine  Result Value Ref Range Status   Specimen Description   Final    IN/OUT CATH URINE Performed at [facility] [address], [place], [place] [address]    Special Requests   Final    Normal Performed at [facility], [place] [address], [place], [place] [address]    Culture   Final    NO GROWTH Performed at [facility], [place] [address] [place], [place] [address]    Report Status 03/06/2022 FINAL  Final  MRSA Next Gen by PCR, Nasal     Status: None   Collection Time: 03/05/22  6:20 AM   Specimen: Nasal Mucosa; Nasal Swab  Result Value Ref Range Status   MRSA by  PCR Next Gen NOT DETECTED NOT DETECTED Final    Comment: (NOTE) The GeneXpert MRSA Assay (FDA approved for NASAL specimens only), is one component of a comprehensive MRSA colonization surveillance program. It is not intended to diagnose MRSA infection nor to guide or monitor treatment for MRSA infections. Test performance is not FDA approved in patients less than 79 years old. Performed at [facility], [place] [address] [place], [place] [address]   Blood Culture (routine x 2)     Status: None (Preliminary result)   Collection Time: 03/05/22 11:22 AM   Specimen: BLOOD  Result Value Ref Range Status   Specimen Description   Final    BLOOD BLOOD RIGHT HAND Performed at [facility] [address] [place], [place] [address]    Special Requests   Final    BOTTLES DRAWN AEROBIC ONLY Blood Culture adequate volume Performed at [facility] [address] [place], [place] [address]    Culture   Final    NO GROWTH 2 DAYS Performed at [facility] [address] [place],  [address]    Report Status PENDING  Incomplete    Antimicrobials: Anti-infectives (From admission, onward)    Start     Dose/Rate Route Frequency Ordered Stop   03/05/22 0345  ceFEPIme (MAXIPIME) 2 g in sodium chloride 0.9 % 100 mL IVPB        2 g 200 mL/hr over 30  Minutes Intravenous  Once 03/05/22 0332 03/05/22 0410   03/05/22 0345  metroNIDAZOLE (FLAGYL) IVPB 500 mg        500 mg 100 mL/hr over 60 Minutes Intravenous  Once 03/05/22 0332 03/05/22 0534   03/05/22 0345  vancomycin (VANCOCIN) IVPB 1000 mg/200 mL premix        1,000 mg 200 mL/hr over 60 Minutes Intravenous  Once 03/05/22 0332 03/05/22 0534      Culture/Microbiology    Component Value Date/Time   SDES  03/05/2022 1122    BLOOD BLOOD RIGHT HAND Performed at [facility], [place] [address], [place], [place] [address]    SPECREQUEST  03/05/2022 1122     BOTTLES DRAWN AEROBIC ONLY Blood Culture adequate volume Performed at [facility] [address], [place], [place] [address]    CULT  03/05/2022 1122    NO GROWTH 2 DAYS Performed at [facility] [address] [place], [place] [address]    REPTSTATUS PENDING 03/05/2022 1122    Other culture-see note  Radiology Studies: MR CERVICAL SPINE WO CONTRAST  Result Date: 03/07/2022 CLINICAL DATA:  Left arm weakness and numbness EXAM: MRI CERVICAL SPINE WITHOUT CONTRAST TECHNIQUE: Multiplanar, multisequence MR imaging of the cervical spine was performed. No intravenous contrast was administered. COMPARISON:  03/29/2015 MRI cervical spine, correlation is also made with 01/08/2022 CT cervical spine FINDINGS: Alignment: Straightening and mild reversal of the normal cervical lordosis. 2 mm anterolisthesis of C4 on C5, which appears unchanged compared to 01/08/2022. Vertebrae: No acute fracture or suspicious osseous lesion. Cord: Normal signal and morphology. Posterior Fossa, vertebral arteries, paraspinal tissues: Negative. Disc levels: C2-C3: No significant disc bulge. No spinal canal stenosis or neuroforaminal narrowing. C3-C4: No significant disc bulge. Right-greater-than-left uncovertebral hypertrophy and left-greater-than-right facet arthropathy. No spinal canal stenosis. Moderate to severe right and moderate left neural foraminal narrowing, which has progressed on the right. C4-C5: Trace anterolisthesis with disc unroofing and minimal disc bulge. Facet and uncovertebral hypertrophy. No spinal canal stenosis. Moderate to severe bilateral neural foraminal narrowing, which has progressed from the prior exam. C5-C6: Mild disc bulge. Right-greater-than-left facet arthropathy. Uncovertebral hypertrophy. Mild spinal canal stenosis, which has progressed from the prior exam. Moderate left and mild right neural foraminal narrowing, which has progressed on the left. C6-C7: Minimal disc bulge.  Uncovertebral hypertrophy. No spinal canal stenosis or neural foraminal narrowing. C7-T1: No significant disc bulge. Left-greater-than-right facet arthropathy. No spinal canal stenosis or neuroforaminal narrowing. IMPRESSION: 1. C5-C6 mild spinal canal stenosis with moderate left and mild right neural foraminal narrowing. 2. C4-C5 moderate to severe bilateral neural foraminal narrowing. 3. C3-C4 moderate to severe right and moderate left neural foraminal narrowing. 4. Normal spinal cord signal and morphology. Electronically Signed   By: Merilyn Baba M.D.   On: 03/07/2022 04:17   MR BRAIN W WO CONTRAST  Result Date: 03/05/2022 CLINICAL DATA:  Acute neurologic deficit EXAM: MRI HEAD WITHOUT AND WITH CONTRAST TECHNIQUE: Multiplanar, multiecho pulse sequences of the brain and surrounding structures were obtained without and with intravenous contrast. CONTRAST:  88m GADAVIST GADOBUTROL 1 MMOL/ML IV SOLN COMPARISON:  None Available. FINDINGS: Brain: No acute infarct, mass effect or extra-axial collection. No acute or chronic hemorrhage. Normal white matter signal, parenchymal volume and CSF spaces. The midline structures are normal. Vascular: Major flow voids are preserved. Skull and upper cervical spine: Normal calvarium and skull base. Visualized upper cervical spine and soft tissues are normal. Sinuses/Orbits:No paranasal sinus fluid levels or advanced mucosal  thickening. No mastoid or middle ear effusion. Normal orbits. IMPRESSION: Normal brain MRI. Electronically Signed   By: Ulyses Jarred M.D.   On: 03/05/2022 22:27     LOS: 2 days   Antonieta Pert, MD [facility]  03/07/2022, 7:15 AM

## 2022-03-07 NOTE — Progress Notes (Signed)
Brief Neuro Update:  MRI C spine does not explain the noted non dermatomal LUE sensory loss and weakness. 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  Discussed with Dr. Antonieta Pert. Follow up with neurology outpatient for EMG/NCS and PT and OT. We will signoff. Please feel free to contact us with any questions or concerns.  [place] Pager Number [phone]

## 2022-03-07 NOTE — Progress Notes (Signed)
Bipap not needed at this time. No resp distress.

## 2022-03-07 NOTE — Plan of Care (Signed)
  Problem: Education: Goal: Knowledge of General Education information will improve Description: Including pain rating scale, medication(s)/side effects and non-pharmacologic comfort measures Outcome: Progressing   Problem: Clinical Measurements: Goal: Ability to maintain clinical measurements within normal limits will improve Outcome: Progressing Goal: Diagnostic test results will improve Outcome: Progressing   Problem: Coping: Goal: Level of anxiety will decrease Outcome: Progressing   Problem: Safety: Goal: Ability to remain free from injury will improve Outcome: Progressing

## 2022-03-08 ENCOUNTER — Inpatient Hospital Stay (HOSPITAL_COMMUNITY): Payer: BLUE CROSS/BLUE SHIELD

## 2022-03-08 DIAGNOSIS — J9601 Acute respiratory failure with hypoxia: Secondary | ICD-10-CM | POA: Diagnosis not present

## 2022-03-08 LAB — CBC
HCT: 34 % — ABNORMAL LOW (ref 36.0–46.0)
Hemoglobin: 11.4 g/dL — ABNORMAL LOW (ref 12.0–15.0)
MCH: 31.3 pg (ref 26.0–34.0)
MCHC: 33.5 g/dL (ref 30.0–36.0)
MCV: 93.4 fL (ref 80.0–100.0)
Platelets: 155 10*3/uL (ref 150–400)
RBC: 3.64 MIL/uL — ABNORMAL LOW (ref 3.87–5.11)
RDW: 12.9 % (ref 11.5–15.5)
WBC: 6.9 10*3/uL (ref 4.0–10.5)
nRBC: 0 % (ref 0.0–0.2)

## 2022-03-08 LAB — GLUCOSE, CAPILLARY
Glucose-Capillary: 109 mg/dL — ABNORMAL HIGH (ref 70–99)
Glucose-Capillary: 113 mg/dL — ABNORMAL HIGH (ref 70–99)
Glucose-Capillary: 116 mg/dL — ABNORMAL HIGH (ref 70–99)
Glucose-Capillary: 117 mg/dL — ABNORMAL HIGH (ref 70–99)
Glucose-Capillary: 140 mg/dL — ABNORMAL HIGH (ref 70–99)
Glucose-Capillary: 149 mg/dL — ABNORMAL HIGH (ref 70–99)
Glucose-Capillary: 174 mg/dL — ABNORMAL HIGH (ref 70–99)

## 2022-03-08 LAB — BASIC METABOLIC PANEL
Anion gap: 5 (ref 5–15)
BUN: <5 mg/dL — ABNORMAL LOW (ref 6–20)
CO2: 23 mmol/L (ref 22–32)
Calcium: 8.3 mg/dL — ABNORMAL LOW (ref 8.9–10.3)
Chloride: 112 mmol/L — ABNORMAL HIGH (ref 98–111)
Creatinine, Ser: 0.57 mg/dL (ref 0.44–1.00)
GFR, Estimated: >60 mL/min (ref >60)
Glucose, Bld: 100 mg/dL — ABNORMAL HIGH (ref 70–99)
Potassium: 3.4 mmol/L — ABNORMAL LOW (ref 3.5–5.1)
Sodium: 140 mmol/L (ref 135–145)

## 2022-03-08 LAB — PROCALCITONIN: Procalcitonin: <0.1 ng/mL

## 2022-03-08 LAB — CK
Total CK: 1676 U/L — ABNORMAL HIGH (ref 38–234)
Total CK: 1531 U/L — ABNORMAL HIGH (ref 38–234)

## 2022-03-08 MED ORDER — ALPRAZOLAM 0.25 MG PO TABS
0.2500 mg | ORAL_TABLET | Freq: Once | ORAL | Status: AC | PRN
  Administered 2022-03-08: 0.25 mg via ORAL
  Filled 2022-03-08: qty 1

## 2022-03-08 MED ORDER — POTASSIUM CHLORIDE CRYS ER 20 MEQ PO TBCR
20.0000 meq | EXTENDED_RELEASE_TABLET | Freq: Once | ORAL | Status: AC
  Administered 2022-03-08: 20 meq via ORAL
  Filled 2022-03-08: qty 1

## 2022-03-08 NOTE — Progress Notes (Signed)
PT Cancellation Note  Patient Details Name: Amy Villa MRN: [id number] DOB: August 17, 1965   Cancelled Treatment:    Reason Eval/Treat Not Completed: Other (comment). Pt getting cleaned up. Will check back as schedule permits.   Galen [place] 03/08/2022, 10:27 AM

## 2022-03-08 NOTE — Progress Notes (Signed)
PROGRESS NOTE Amy Villa  UJW:[id number] DOB: [birth year] DOA: 03/05/2022 PCP: Patient, No Pcp Per   Brief Narrative/Hospital Course: 56yof with PMH of alcohol use, alcoholic cirrhosis with EV, COPD, SIMD who came in after being found unresponsive at a motel having just snorted heroin.Apparently given '8mg'$  narcan in the field. In ED,she was found to be hypothermic with acute hypercapnic respiratory failure started on BiPAP with vbg 7.1/75, lactate 7, EKG with ST depressions and QTc mildly prolonged without other interval prolongation, with mild rhabdomyolysis. Patient is admitted to ICU for acute hypercapnic respiratory failure/acute metabolic encephalopathy polysubstance abuse and suspected heroin intoxication. Patient clinically improved was back to baseline alert awake oriented x4, no longer needing BiPAP and transferred to [facility] service 03/06/22.   Subjective: Seen and examined Complains of excessive urination Still needing oxygen, having some cough Tmax 99.5 overnight Afebrile overnight  Assessment and Plan: Principal Problem:   Acute hypercapnic respiratory failure (HCC) Active Problems:   Alcohol abuse   Liver cirrhosis (HCC)   Anxiety and depression   Substance abuse ([place])   Alcohol abuse with alcohol-induced mood disorder (HCC)   Rhabdomyolysis   Acute metabolic encephalopathy   Acute hypercapnic respiratory failure Acute hypoxic respiratory failure: No longer needing BiPAP remains on supplemental oxygen, wean as tolerated, ambulate.    Suspected heroin intoxication Polysubstance abuse: TOC consulted, substance abuse cessation counseling, alcohol cessation counseling.  Continue supportive care  Acute metabolic encephalopathy: mental status is stable improved to baseline, likely multifactorial due to hypercapnia, intoxication.Continue supportive care  Left upper extremity weakness and numbness/left wrist drop: mri brian no aute finding. likely related to prolonged  immobilization/laying on the left side w/ compression radial nerve palsy.  Seen by neurology MRI C-spine :" C5-C6 mild spinal canal stenosis with moderate left and mild right neural foraminal narrowing.2. C4-C5 moderate to severe bilateral neural foraminal narrowing.C3-C4 moderate to severe right and moderate left neural foraminal narrowing.Normal spinal cord signal and morphology."  Discussed with neurology-MRI findings are not impressive , advised outpatient neurology follow-up for EMG. continue PT OT.  OT applying wrist splint  Rhabdomyolysis: Likely from prolonged immobilization downtrending-downtrending stop IV fluids encourage p.o. trend CK  Alcohol abuse: Continue thiamine, vitamin CIWA ativan.  Cessation counseling  Liver cirrhosis due to alcoholic liver disease with history of EV: Hemoglobin/platelet stable.  Supportive care outpatient GI follow-up.  LFTs relatively stable Recent Labs  Lab 03/05/22 0320  AST 42*  ALT 29  ALKPHOS 109  BILITOT 0.4  PROT 8.2*  ALBUMIN 4.6  INR 1.4*     Mild leukocytosis on admission Possible pneumonia:  No clear source of infection, afebrile.  Initially sepsis suspected, antibiotics discontinued in ICU as sepsis ruled out. Blood culture urine culture no growth so far.  Had low-grade temp 8/19 chest x-ray showed new hazy airspace lung opacity in the right upper lobe and medial right lung base possible multifocal pneumonia started on ceftriaxone azithromycin 8/19- But Pocal is low- ??  Pneumonia we will obtain CT chest.  We will also stop IV fluids which could have contributed  Recent Labs  Lab 03/05/22 0320 03/05/22 0546 03/05/22 0620 03/06/22 0318 03/07/22 0457  WBC 20.1*  --  14.2* 6.5 7.7  LATICACIDVEN 7.4* 3.1*  --   --   --   PROCALCITON  --   --   --   --  5.62     Metabolic acidosis/lactic acidosis: Resolved  Mild hypokalemia repleted Acute kidney injury: Creatinine 1.1 on admission,basline 0.6-resolved Recent Labs  Lab  03/05/22 0320 03/05/22 0620 03/06/22 0318 03/07/22 0457  BUN 12  --  6 5*  CREATININE 1.18* 0.90 0.69 0.62    Prediabetes with HbA1c 6.1 Hyperglycemia 340 on admission blood sugar stable monitor Recent Labs  Lab 03/05/22 0320 03/05/22 0333 03/07/22 1202 03/07/22 1655 03/07/22 1941 03/07/22 2359 03/08/22 0352  GLUCAP  --    < > 122* 189* 105* 116* 113*  HGBA1C 6.1*  --   --   --   --   --   --    < > = values in this interval not displayed.     Alcohol abuse with alcohol-induced mood disorder Anxiety and depression: Her mood is stable on Seroquel, Trileptal, Lamictal, Neurontin. Hypokalemia:replete  DVT prophylaxis: enoxaparin (LOVENOX) injection 40 mg Start: 03/05/22 1000 Code Status:   Code Status: Full Code Family Communication: plan of care discussed with patient at bedside. Patient status is: Inpatient because of ongoing management of rhabdomyolysis left arm weakness Level of care: Telemetry  Dispo: The patient is from: home w/ husband            Anticipated disposition: TBD 1-2 days.  PT OT consulted  Mobility Assessment (last 72 hours)     Mobility Assessment     Row Name 03/07/22 0739 03/06/22 1400 03/06/22 1358       Does patient have an order for bedrest or is patient medically unstable No - Continue assessment No - Continue assessment No - Continue assessment     What is the highest level of mobility based on the progressive mobility assessment? Level 5 (Walks with assist in room/hall) - Balance while stepping forward/back and can walk in room with assist - Complete Level 5 (Walks with assist in room/hall) - Balance while stepping forward/back and can walk in room with assist - Complete Level 5 (Walks with assist in room/hall) - Balance while stepping forward/back and can walk in room with assist - Complete               Objective: Vitals last 24 hrs: Vitals:   03/07/22 0858 03/07/22 1205 03/07/22 1945 03/08/22 0356  BP:  (!) 144/99 (!) 160/100  124/81  Pulse:  95 89 87  Resp:  18    Temp:  98.4 F (36.9 C) 99.5 F (37.5 C) 98.4 F (36.9 C)  TempSrc:  Oral Oral Oral  SpO2: 92% 91% 97%   Weight:    59.9 kg  Height:       Weight change:   Physical Examination: General exam: AAox3, on bedside chair HEENT:Oral mucosa moist, Ear/Nose WNL grossly, dentition normal. Respiratory system: bilaterally diminished, no use of accessory muscle Cardiovascular system: S1 & S2 +, No JVD,. Gastrointestinal system: Abdomen soft,NT,ND,BS+ Nervous System:Alert, awake, moving extremities and grossly nonfocal Extremities: LE ankle edema eng, distal peripheral pulses palpable.  Skin: No rashes,no icterus. MSK: Normal muscle bulk,tone, power    Medications reviewed:  Scheduled Meds:  Chlorhexidine Gluconate Cloth  6 each Topical Daily   enoxaparin (LOVENOX) injection  40 mg Subcutaneous Q24H   fluticasone furoate-vilanterol  1 puff Inhalation Daily   folic acid  1 mg Oral Daily   gabapentin  800 mg Oral TID   hydrOXYzine  50-100 mg Oral TID   insulin aspart  0-15 Units Subcutaneous Q4H   lamoTRIgine  25 mg Oral Daily   multivitamin with minerals  1 tablet Oral Daily   nicotine  14 mg Transdermal Daily   Oxcarbazepine  300 mg Oral BID  QUEtiapine  400 mg Oral QHS   thiamine  100 mg Oral Daily   Or   thiamine  100 mg Intravenous Daily   Continuous Infusions:  sodium chloride 100 mL/hr at 03/08/22 0159   azithromycin 500 mg (03/07/22 1512)   cefTRIAXone (ROCEPHIN)  IV 2 g (03/07/22 1316)      Diet Order             Diet regular Room service appropriate? Yes; Fluid consistency: Thin  Diet effective now                   Intake/Output Summary (Last 24 hours) at 03/08/2022 0721 Last data filed at 03/08/2022 0503 Gross per 24 hour  Intake 4392.14 ml  Output 1700 ml  Net 2692.14 ml    Net IO Since Admission: 2,768.7 mL [03/08/22 0721]  Wt Readings from Last 3 Encounters:  03/08/22 59.9 kg  01/12/22 58.8 kg  11/15/19  63.7 kg     Unresulted Labs (From admission, onward)     Start     Ordered   03/12/22 0500  Creatinine, serum  (enoxaparin (LOVENOX)    CrCl >/= 30 ml/min)  Weekly,   R     Comments: while on enoxaparin therapy    03/05/22 0550   03/09/22 0500  CBC  Tomorrow morning,   R       Question:  Specimen collection method  Answer:  Lab=Lab collect   03/08/22 0720   03/09/22 5056  Basic metabolic panel  Tomorrow morning,   R       Question:  Specimen collection method  Answer:  Lab=Lab collect   03/08/22 0720   03/09/22 0500  Procalcitonin  Daily at 5am,   R     Question:  Specimen collection method  Answer:  Lab=Lab collect   03/08/22 0720   03/08/22 0721  CBC  Once,   R       Question:  Specimen collection method  Answer:  Lab=Lab collect   03/08/22 0720   03/08/22 9794  Basic metabolic panel  Add-on,   AD       Question:  Specimen collection method  Answer:  Lab=Lab collect   03/08/22 0720   03/08/22 0721  Procalcitonin - Baseline  Add-on,   AD       Question:  Specimen collection method  Answer:  Lab=Lab collect   03/08/22 0721   03/06/22 1700  CK  Now then every 12 hours,   R (with TIMED occurrences)     Question:  Specimen collection method  Answer:  Lab=Lab collect   03/06/22 0733          Data Reviewed: I have personally reviewed following labs and imaging studies CBC: Recent Labs  Lab 03/05/22 0320 03/05/22 0620 03/06/22 0318 03/07/22 0457  WBC 20.1* 14.2* 6.5 7.7  NEUTROABS 17.3*  --   --   --   HGB 14.5 11.6* 11.5* 11.5*  HCT 46.1* 35.5* 35.1* 34.8*  MCV 99.4 93.2 95.1 93.3  PLT 251 196 147* 146*    Basic Metabolic Panel: Recent Labs  Lab 03/05/22 0320 03/05/22 0620 03/06/22 0318 03/07/22 0457  NA 140  --  142 140  K 5.1  --  3.7 3.3*  CL 103  --  108 108  CO2 21*  --  26 27  GLUCOSE 340*  --  99 106*  BUN 12  --  6 5*  CREATININE 1.18* 0.90 0.69 0.62  CALCIUM 9.1  --  8.5* 8.3*    GFR: Estimated Creatinine Clearance: 65.7 mL/min (by C-G formula  based on SCr of 0.62 mg/dL). Liver Function Tests: Recent Labs  Lab 03/05/22 0320  AST 42*  ALT 29  ALKPHOS 109  BILITOT 0.4  PROT 8.2*  ALBUMIN 4.6    No results for input(s): "LIPASE", "AMYLASE" in the last 168 hours. Recent Labs  Lab 03/06/22 0318  AMMONIA 15    Coagulation Profile: Recent Labs  Lab 03/05/22 0320  INR 1.4*    BNP (last 3 results) No results for input(s): "PROBNP" in the last 8760 hours. HbA1C: No results for input(s): "HGBA1C" in the last 72 hours.  CBG: Recent Labs  Lab 03/07/22 1202 03/07/22 1655 03/07/22 1941 03/07/22 2359 03/08/22 0352  GLUCAP 122* 189* 105* 116* 113*    Lipid Profile: No results for input(s): "CHOL", "HDL", "LDLCALC", "TRIG", "CHOLHDL", "LDLDIRECT" in the last 72 hours. Thyroid Function Tests: No results for input(s): "TSH", "T4TOTAL", "FREET4", "T3FREE", "THYROIDAB" in the last 72 hours. Sepsis Labs: Recent Labs  Lab 03/05/22 0320 03/05/22 0546 03/07/22 0457  PROCALCITON  --   --  0.25  LATICACIDVEN 7.4* 3.1*  --      Recent Results (from the past 240 hour(s))  Blood Culture (routine x 2)     Status: None (Preliminary result)   Collection Time: 03/05/22  3:16 AM   Specimen: BLOOD  Result Value Ref Range Status   Specimen Description   Final    BLOOD SITE NOT SPECIFIED Performed at [place] [address] [place], [place] [address]    Special Requests   Final    BOTTLES DRAWN AEROBIC AND ANAEROBIC Blood Culture results may not be optimal due to an inadequate volume of blood received in culture bottles Performed at [place] [address], [place], [place] [address]    Culture   Final    NO GROWTH 3 DAYS Performed at [facility], [place] [address], [place], [place] [address]    Report Status PENDING  Incomplete  SARS Coronavirus 2 by RT PCR (hospital order, performed in [facility] hospital lab) *cepheid single result test* Anterior Nasal Swab      Status: None   Collection Time: 03/05/22  3:41 AM   Specimen: Anterior Nasal Swab  Result Value Ref Range Status   SARS Coronavirus 2 by RT PCR NEGATIVE NEGATIVE Final    Comment: (NOTE) SARS-CoV-2 target nucleic acids are NOT DETECTED.  The SARS-CoV-2 RNA is generally detectable in upper and lower respiratory specimens during the acute phase of infection. The lowest concentration of SARS-CoV-2 viral copies this assay can detect is 250 copies / mL. A negative result does not preclude SARS-CoV-2 infection and should not be used as the sole basis for treatment or other patient management decisions.  A negative result may occur with improper specimen collection / handling, submission of specimen other than nasopharyngeal swab, presence of viral mutation(s) within the areas targeted by this assay, and inadequate number of viral copies (<250 copies / mL). A negative result must be combined with clinical observations, patient history, and epidemiological information.  Fact Sheet for Patients:   https://www.patel.info/  Fact Sheet for Healthcare Providers: https://hall.com/  This test is not yet approved or  cleared by the [place] FDA and has been authorized for detection and/or diagnosis of SARS-CoV-2 by FDA under an Emergency Use Authorization (EUA).  This EUA will remain in effect (meaning this test can be used) for  the duration of the COVID-19 declaration under Section 564(b)(1) of the Act, 21 U.S.C. section 360bbb-3(b)(1), unless the authorization is terminated or revoked sooner.  Performed at [facility], [place] [address] [place], [place] [address]   Resp Panel by RT-PCR (Flu A&B, Covid) Anterior Nasal Swab     Status: None   Collection Time: 03/05/22  3:41 AM   Specimen: Anterior Nasal Swab  Result Value Ref Range Status   SARS Coronavirus 2 by RT PCR NEGATIVE NEGATIVE Final    Comment: (NOTE) SARS-CoV-2  target nucleic acids are NOT DETECTED.  The SARS-CoV-2 RNA is generally detectable in upper respiratory specimens during the acute phase of infection. The lowest concentration of SARS-CoV-2 viral copies this assay can detect is 138 copies/mL. A negative result does not preclude SARS-Cov-2 infection and should not be used as the sole basis for treatment or other patient management decisions. A negative result may occur with  improper specimen collection/handling, submission of specimen other than nasopharyngeal swab, presence of viral mutation(s) within the areas targeted by this assay, and inadequate number of viral copies(<138 copies/mL). A negative result must be combined with clinical observations, patient history, and epidemiological information. The expected result is Negative.  Fact Sheet for Patients:  EntrepreneurPulse.com.au  Fact Sheet for Healthcare Providers:  IncredibleEmployment.be  This test is no t yet approved or cleared by the [place] FDA and  has been authorized for detection and/or diagnosis of SARS-CoV-2 by FDA under an Emergency Use Authorization (EUA). This EUA will remain  in effect (meaning this test can be used) for the duration of the COVID-19 declaration under Section 564(b)(1) of the Act, 21 U.S.C.section 360bbb-3(b)(1), unless the authorization is terminated  or revoked sooner.       Influenza A by PCR NEGATIVE NEGATIVE Final   Influenza B by PCR NEGATIVE NEGATIVE Final    Comment: (NOTE) The Xpert Xpress SARS-CoV-2/FLU/RSV plus assay is intended as an aid in the diagnosis of influenza from Nasopharyngeal swab specimens and should not be used as a sole basis for treatment. Nasal washings and aspirates are unacceptable for Xpert Xpress SARS-CoV-2/FLU/RSV testing.  Fact Sheet for Patients: EntrepreneurPulse.com.au  Fact Sheet for Healthcare  Providers: IncredibleEmployment.be  This test is not yet approved or cleared by the [place] FDA and has been authorized for detection and/or diagnosis of SARS-CoV-2 by FDA under an Emergency Use Authorization (EUA). This EUA will remain in effect (meaning this test can be used) for the duration of the COVID-19 declaration under Section 564(b)(1) of the Act, 21 U.S.C. section 360bbb-3(b)(1), unless the authorization is terminated or revoked.  Performed at [facility], [place] [address], [place], [place] [address]   Urine Culture     Status: None   Collection Time: 03/05/22  5:46 AM   Specimen: In/Out Cath Urine  Result Value Ref Range Status   Specimen Description   Final    IN/OUT CATH URINE Performed at [place] [address], [place], [place] [address]    Special Requests   Final    Normal Performed at [facility], [place] [address], [place], [place] [address]    Culture   Final    NO GROWTH Performed at [facility], [place] [address] [place], [place] [address]    Report Status 03/06/2022 FINAL  Final  MRSA Next Gen by PCR, Nasal     Status: None   Collection Time: 03/05/22  6:20 AM   Specimen:  Nasal Mucosa; Nasal Swab  Result Value Ref Range Status   MRSA by PCR Next Gen NOT DETECTED NOT DETECTED Final    Comment: (NOTE) The GeneXpert MRSA Assay (FDA approved for NASAL specimens only), is one component of a comprehensive MRSA colonization surveillance program. It is not intended to diagnose MRSA infection nor to guide or monitor treatment for MRSA infections. Test performance is not FDA approved in patients less than 81 years old. Performed at [facility], [place] [address], [place], [place] [address]   Blood Culture (routine x 2)     Status: None (Preliminary result)   Collection Time: 03/05/22 11:22 AM   Specimen: BLOOD  Result Value Ref Range Status    Specimen Description   Final    BLOOD BLOOD RIGHT HAND Performed at [place] [address], [place], [place] [address]    Special Requests   Final    BOTTLES DRAWN AEROBIC ONLY Blood Culture adequate volume Performed at [place] [address], [place], [place] [address]    Culture   Final    NO GROWTH 3 DAYS Performed at [facility], [place] [address], [place], [place] [address]    Report Status PENDING  Incomplete    Antimicrobials: Anti-infectives (From admission, onward)    Start     Dose/Rate Route Frequency Ordered Stop   03/07/22 1315  cefTRIAXone (ROCEPHIN) 2 g in sodium chloride 0.9 % 100 mL IVPB        2 g 200 mL/hr over 30 Minutes Intravenous Every 24 hours 03/07/22 1216     03/07/22 1315  azithromycin (ZITHROMAX) 500 mg in sodium chloride 0.9 % 250 mL IVPB        500 mg 250 mL/hr over 60 Minutes Intravenous Every 24 hours 03/07/22 1216     03/05/22 0345  ceFEPIme (MAXIPIME) 2 g in sodium chloride 0.9 % 100 mL IVPB        2 g 200 mL/hr over 30 Minutes Intravenous  Once 03/05/22 0332 03/05/22 0410   03/05/22 0345  metroNIDAZOLE (FLAGYL) IVPB 500 mg        500 mg 100 mL/hr over 60 Minutes Intravenous  Once 03/05/22 0332 03/05/22 0534   03/05/22 0345  vancomycin (VANCOCIN) IVPB 1000 mg/200 mL premix        1,000 mg 200 mL/hr over 60 Minutes Intravenous  Once 03/05/22 0332 03/05/22 0534      Culture/Microbiology    Component Value Date/Time   SDES  03/05/2022 1122    BLOOD BLOOD RIGHT HAND Performed at [facility], [place] [address] [place], [place] [address]    SPECREQUEST  03/05/2022 1122    BOTTLES DRAWN AEROBIC ONLY Blood Culture adequate volume Performed at [place] [address], [place], [place] [address]    CULT  03/05/2022 1122    NO GROWTH 3 DAYS Performed at [place] [address] [place], [place] [address]    REPTSTATUS PENDING 03/05/2022  1122    Other culture-see note  Radiology Studies: DG Chest Port 1 View  Result Date: 03/07/2022 CLINICAL DATA:  fever. Pt reported a dry cough when taking in a deep breath. Pt presented to the hospital two days ago after being found unresponsive in a seated position with her left arm behind her at a motel after using heroin. Pt reported medical hx of COPD. EXAM: PORTABLE CHEST 1 VIEW COMPARISON:  03/05/2022 and older exams.  FINDINGS: Hazy airspace opacity has developed in the right upper lobe, bordering the minor fissure, and more subtly at the medial right lung base, since the prior exam. Remainder of the lungs is clear. Cardiac silhouette normal in size and configuration. Normal mediastinal and hilar contours. No convincing pleural effusion or pneumothorax. IMPRESSION: 1. New hazy airspace lung opacity in the right upper lobe, and medial right lung base, consistent with multifocal pneumonia. Electronically Signed   By: Lajean Manes M.D.   On: 03/07/2022 09:39   MR CERVICAL SPINE WO CONTRAST  Result Date: 03/07/2022 CLINICAL DATA:  Left arm weakness and numbness EXAM: MRI CERVICAL SPINE WITHOUT CONTRAST TECHNIQUE: Multiplanar, multisequence MR imaging of the cervical spine was performed. No intravenous contrast was administered. COMPARISON:  03/29/2015 MRI cervical spine, correlation is also made with 01/08/2022 CT cervical spine FINDINGS: Alignment: Straightening and mild reversal of the normal cervical lordosis. 2 mm anterolisthesis of C4 on C5, which appears unchanged compared to 01/08/2022. Vertebrae: No acute fracture or suspicious osseous lesion. Cord: Normal signal and morphology. Posterior Fossa, vertebral arteries, paraspinal tissues: Negative. Disc levels: C2-C3: No significant disc bulge. No spinal canal stenosis or neuroforaminal narrowing. C3-C4: No significant disc bulge. Right-greater-than-left uncovertebral hypertrophy and left-greater-than-right facet arthropathy. No spinal canal  stenosis. Moderate to severe right and moderate left neural foraminal narrowing, which has progressed on the right. C4-C5: Trace anterolisthesis with disc unroofing and minimal disc bulge. Facet and uncovertebral hypertrophy. No spinal canal stenosis. Moderate to severe bilateral neural foraminal narrowing, which has progressed from the prior exam. C5-C6: Mild disc bulge. Right-greater-than-left facet arthropathy. Uncovertebral hypertrophy. Mild spinal canal stenosis, which has progressed from the prior exam. Moderate left and mild right neural foraminal narrowing, which has progressed on the left. C6-C7: Minimal disc bulge. Uncovertebral hypertrophy. No spinal canal stenosis or neural foraminal narrowing. C7-T1: No significant disc bulge. Left-greater-than-right facet arthropathy. No spinal canal stenosis or neuroforaminal narrowing. IMPRESSION: 1. C5-C6 mild spinal canal stenosis with moderate left and mild right neural foraminal narrowing. 2. C4-C5 moderate to severe bilateral neural foraminal narrowing. 3. C3-C4 moderate to severe right and moderate left neural foraminal narrowing. 4. Normal spinal cord signal and morphology. Electronically Signed   By: Merilyn Baba M.D.   On: 03/07/2022 04:17     LOS: 3 days   Antonieta Pert, MD [facility]  03/08/2022, 7:21 AM

## 2022-03-08 NOTE — Evaluation (Signed)
Physical Therapy Evaluation Patient Details Name: Amy Villa MRN: [id number] DOB: June 02, 1966 Today's Date: 03/08/2022  History of Present Illness  Patient is a 56 year old female who was [place] unresponsive at motel with narcan administered. patient was admitted with acute hypercapnic respiratory failure,acute metabolic encephalopathy, polysubstanec abuse and placed on BiPAP in ICU. patient transiioned out of ICU on 8/18. patient noted to have LUE weakness with LUE wrist drop. BWL:SLHT, esophageal verices,  Clinical Impression  Pt admitted with above diagnosis.  Pt currently with functional limitations due to the deficits listed below (see PT Problem List). Pt will benefit from skilled PT to increase their independence and safety with mobility to allow discharge to the venue listed below.  Overall pt is moving well. Slight challenges with head turns/head nods.  Some festinating gait which she was able to self identify and self correct. Will follow acutely, but no post dc needs identified.        Recommendations for follow up therapy are one component of a multi-disciplinary discharge planning process, led by the attending physician.  Recommendations may be updated based on patient status, additional functional criteria and insurance authorization.  Follow Up Recommendations No PT follow up      Assistance Recommended at Discharge PRN  Patient can return home with the following  A little help with walking and/or transfers    Equipment Recommendations None recommended by PT  Recommendations for Other Services       Functional Status Assessment Patient has had a recent decline in their functional status and demonstrates the ability to make significant improvements in function in a reasonable and predictable amount of time.     Precautions / Restrictions Precautions Precaution Comments: wrist drop L, monitor O2 Restrictions Weight Bearing Restrictions: No      Mobility  Bed  Mobility               General bed mobility comments: patient was up in recliner and returned to the same    Transfers Overall transfer level: Modified independent Equipment used: None                    Ambulation/Gait Ambulation/Gait assistance: Min guard, Supervision Gait Distance (Feet): 300 Feet Assistive device: None Gait Pattern/deviations: Festinating, Step-through pattern, Decreased step length - right, Decreased step length - left       General Gait Details: some dizziness with higher level balance activities with head nods/turns.  Festinating gait which was able to be corrected with focused slow deliberate longer steps. o2 in 90s on room air  Stairs            Wheelchair Mobility    Modified Rankin (Stroke Patients Only)       Balance Overall balance assessment: Mild deficits observed, not formally tested                                           Pertinent Vitals/Pain Pain Assessment Pain Assessment: No/denies pain    Home Living Family/patient expects to be discharged to:: Private residence Living Arrangements: Spouse/significant other Available Help at Discharge: Family;Available PRN/intermittently Type of Home: House     Entrance Stairs-Number of Steps: 2   Home Layout: One level Home Equipment: None      Prior Function Prior Level of Function : Independent/Modified Independent  Hand Dominance   Dominant Hand: Right    Extremity/Trunk Assessment   Upper Extremity Assessment Upper Extremity Assessment: Defer to OT evaluation LUE Deficits / Details: patient is able to AROM shoulder, and elbow WFL. patient noted to be unable to extend wrist with impacting movement of digits. MD consulted over ordering wrist cock up splint    Lower Extremity Assessment Lower Extremity Assessment: Overall WFL for tasks assessed    Cervical / Trunk Assessment Cervical / Trunk Assessment:  Normal  Communication   Communication: No difficulties  Cognition Arousal/Alertness: Awake/alert Behavior During Therapy: WFL for tasks assessed/performed Overall Cognitive Status: Within Functional Limits for tasks assessed                                          General Comments General comments (skin integrity, edema, etc.): DR. Lupita Leash MD secure chatted on this date with MD in agreement to order wrist cock up splint for patient.    Exercises     Assessment/Plan    PT Assessment Patient needs continued PT services  PT Problem List Decreased mobility;Decreased balance       PT Treatment Interventions DME instruction;Gait training;Stair training;Functional mobility training;Balance training;Therapeutic exercise;Therapeutic activities    PT Goals (Current goals can be found in the Care Plan section)  Acute Rehab PT Goals Patient Stated Goal: go home PT Goal Formulation: With patient Time For Goal Achievement: 03/15/22 Potential to Achieve Goals: Good    Frequency Min 3X/week     Co-evaluation               AM-PAC PT "6 Clicks" Mobility  Outcome Measure Help needed turning from your back to your side while in a flat bed without using bedrails?: None Help needed moving from lying on your back to sitting on the side of a flat bed without using bedrails?: None Help needed moving to and from a bed to a chair (including a wheelchair)?: None Help needed standing up from a chair using your arms (e.g., wheelchair or bedside chair)?: None Help needed to walk in hospital room?: A Little Help needed climbing 3-5 steps with a railing? : A Little 6 Click Score: 22    End of Session Equipment Utilized During Treatment: Gait belt Activity Tolerance: Patient tolerated treatment well Patient left: in chair;with call bell/phone within reach Nurse Communication: Mobility status PT Visit Diagnosis: Other abnormalities of gait and mobility (R26.89)    Time:  3382-5053 PT Time Calculation (min) (ACUTE ONLY): 11 min   Charges:   PT Evaluation $PT Eval Low Complexity: 1 Low          Sherie Dobrowolski L. Tamala Julian, [place]  03/08/2022   Galen Manila 03/08/2022, 12:47 PM

## 2022-03-08 NOTE — Progress Notes (Signed)
Pt is resting well at this time. No need of bipap at this time.  

## 2022-03-08 NOTE — Evaluation (Signed)
Occupational Therapy Evaluation Patient Details Name: Amy Villa MRN: [id number] DOB: August 13, 1965 Today's Date: 03/08/2022   History of Present Illness Patient is a 56 year old female who was found unresponsive at motel with narcan administered. patient was admitted with acute hypercapnic respiratory failure,acute metabolic encephalopathy, polysubstanec abuse and placed on BiPAP in ICU. patient transiioned out of ICU on 8/18. patient noted to have LUE weakness with LUE wrist drop. OXB:DZHG, esophageal verices,   Clinical Impression   Patient is a 56 year old female who was admitted for above. Patient has new onset of L wrist drop with fully work up from neuro at this time. Patient has been ordered wrist cock up splint at this time awaiting arrival. Patient was ntoed to have decreased coordination, and ability to complete ADLs with current level of function. Patient would continue to benefit from skilled OT services at this time while admitted and after d/c to address noted deficits in order to improve overall safety and independence in ADLs.        Recommendations for follow up therapy are one component of a multi-disciplinary discharge planning process, led by the attending physician.  Recommendations may be updated based on patient status, additional functional criteria and insurance authorization.   Follow Up Recommendations  Outpatient OT    Assistance Recommended at Discharge PRN  Patient can return home with the following Assistance with cooking/housework;Assist for transportation;Help with stairs or ramp for entrance    Functional Status Assessment  Patient has had a recent decline in their functional status and demonstrates the ability to make significant improvements in function in a reasonable and predictable amount of time.  Equipment Recommendations       Recommendations for Other Services       Precautions / Restrictions Precautions Precaution Comments: wrist drop  L, monitor O2 Restrictions Weight Bearing Restrictions: No      Mobility Bed Mobility               General bed mobility comments: patient was up in recliner and returned to the same    Transfers                          Balance Overall balance assessment: Mild deficits observed, not formally tested                                         ADL either performed or assessed with clinical judgement   ADL Overall ADL's : Needs assistance/impaired Eating/Feeding: Set up;Sitting   Grooming: Wash/dry hands;Supervision/safety;Standing   Upper Body Bathing: Supervision/ safety;Sitting   Lower Body Bathing: Minimal assistance;Sit to/from stand;Sitting/lateral leans Lower Body Bathing Details (indicate cue type and reason): coordination of LUE impacting simulated tasks Upper Body Dressing : Set up;Sitting   Lower Body Dressing: Minimal assistance;Sit to/from stand;Sitting/lateral leans Lower Body Dressing Details (indicate cue type and reason): hard time with clothing management with current positioning of LUE Toilet Transfer: Supervision/safety Toilet Transfer Details (indicate cue type and reason): with IV pole. patient very unsteady with no UE support. Toileting- Clothing Manipulation and Hygiene: Minimal assistance Toileting - Clothing Manipulation Details (indicate cue type and reason): LUE decreased coordination with wrist drop       General ADL Comments: patient was 94% on 3L/min prior to start of session. patient was 98% on RA after transfer to commode. patietn was 90%  on RA after returning to recliner with SOB noted and completed toileting tasks.     Vision Patient Visual Report: No change from baseline       Perception     Praxis      Pertinent Vitals/Pain Pain Assessment Pain Assessment: No/denies pain     Hand Dominance Right   Extremity/Trunk Assessment Upper Extremity Assessment Upper Extremity Assessment: LUE  deficits/detail LUE Deficits / Details: patient is able to AROM shoulder, and elbow WFL. patient noted to be unable to extend wrist with impacting movement of digits. MD consulted over ordering wrist cock up splint   Lower Extremity Assessment Lower Extremity Assessment: Defer to PT evaluation   Cervical / Trunk Assessment Cervical / Trunk Assessment: Normal   Communication Communication Communication: No difficulties   Cognition Arousal/Alertness: Awake/alert Behavior During Therapy: WFL for tasks assessed/performed Overall Cognitive Status: Within Functional Limits for tasks assessed                                       General Comments  DR. Delmar MD secure chatted on this date with MD in agreement to order wrist cock up splint for patient.    Exercises     Shoulder Instructions      Home Living Family/patient expects to be discharged to:: Private residence Living Arrangements: Spouse/significant other Available Help at Discharge: Family;Available PRN/intermittently Type of Home: House   Entrance Stairs-Number of Steps: 2   Home Layout: One level     Bathroom Shower/Tub: Chief Strategy Officer: None          Prior Functioning/Environment Prior Level of Function : Independent/Modified Independent                        OT Problem List: Impaired UE functional use      OT Treatment/Interventions: Self-care/ADL training;Therapeutic exercise;Neuromuscular education;Energy conservation;DME and/or AE instruction;Therapeutic activities;Balance training;Patient/family education;Splinting    OT Goals(Current goals can be found in the care plan section) Acute Rehab OT Goals Patient Stated Goal: for LUE to work again OT Goal Formulation: With patient Time For Goal Achievement: 03/22/22 Potential to Achieve Goals: Fair  OT Frequency: Min 2X/week    Co-evaluation              AM-PAC OT "6 Clicks" Daily Activity      Outcome Measure Help from another person eating meals?: A Little Help from another person taking care of personal grooming?: A Little Help from another person toileting, which includes using toliet, bedpan, or urinal?: A Little Help from another person bathing (including washing, rinsing, drying)?: A Little Help from another person to put on and taking off regular upper body clothing?: None Help from another person to put on and taking off regular lower body clothing?: A Little 6 Click Score: 19   End of Session Equipment Utilized During Treatment: Gait belt Nurse Communication: Other (comment) (communication about splint)  Activity Tolerance: Patient tolerated treatment well Patient left: in chair;with call bell/phone within reach                   Time: 0940-1004 OT Time Calculation (min): 24 min Charges:  OT General Charges $OT Visit: 1 Visit OT Evaluation $OT Eval Moderate Complexity: 1 Mod OT Treatments $Self Care/Home Management : 8-22 mins  Blythe Veach OTR/L, MS Acute Rehabilitation Department  Office# [phone] Pager# [phone]   Marcellina Millin 03/08/2022, 10:27 AM

## 2022-03-08 NOTE — Plan of Care (Signed)
  Problem: Education: Goal: Knowledge of General Education information will improve Description: Including pain rating scale, medication(s)/side effects and non-pharmacologic comfort measures Outcome: Progressing   Problem: Clinical Measurements: Goal: Will remain free from infection Outcome: Progressing Goal: Diagnostic test results will improve Outcome: Progressing   Problem: Coping: Goal: Level of anxiety will decrease Outcome: Progressing   Problem: Safety: Goal: Ability to remain free from injury will improve Outcome: Progressing   Problem: Health Behavior/Discharge Planning: Goal: Ability to manage health-related needs will improve Outcome: Progressing

## 2022-03-09 ENCOUNTER — Inpatient Hospital Stay (HOSPITAL_COMMUNITY): Payer: BLUE CROSS/BLUE SHIELD

## 2022-03-09 DIAGNOSIS — G589 Mononeuropathy, unspecified: Secondary | ICD-10-CM

## 2022-03-09 DIAGNOSIS — J9601 Acute respiratory failure with hypoxia: Secondary | ICD-10-CM | POA: Diagnosis not present

## 2022-03-09 LAB — CBC
HCT: 37 % (ref 36.0–46.0)
Hemoglobin: 12.2 g/dL (ref 12.0–15.0)
MCH: 30.7 pg (ref 26.0–34.0)
MCHC: 33 g/dL (ref 30.0–36.0)
MCV: 93.2 fL (ref 80.0–100.0)
Platelets: 162 10*3/uL (ref 150–400)
RBC: 3.97 MIL/uL (ref 3.87–5.11)
RDW: 12.9 % (ref 11.5–15.5)
WBC: 7.1 10*3/uL (ref 4.0–10.5)
nRBC: 0 % (ref 0.0–0.2)

## 2022-03-09 LAB — BASIC METABOLIC PANEL
Anion gap: 6 (ref 5–15)
BUN: 5 mg/dL — ABNORMAL LOW (ref 6–20)
CO2: 25 mmol/L (ref 22–32)
Calcium: 8.9 mg/dL (ref 8.9–10.3)
Chloride: 110 mmol/L (ref 98–111)
Creatinine, Ser: 0.68 mg/dL (ref 0.44–1.00)
GFR, Estimated: >60 mL/min (ref >60)
Glucose, Bld: 112 mg/dL — ABNORMAL HIGH (ref 70–99)
Potassium: 4.1 mmol/L (ref 3.5–5.1)
Sodium: 141 mmol/L (ref 135–145)

## 2022-03-09 LAB — GLUCOSE, CAPILLARY
Glucose-Capillary: 127 mg/dL — ABNORMAL HIGH (ref 70–99)
Glucose-Capillary: 101 mg/dL — ABNORMAL HIGH (ref 70–99)
Glucose-Capillary: 113 mg/dL — ABNORMAL HIGH (ref 70–99)

## 2022-03-09 LAB — CK: Total CK: 1120 U/L — ABNORMAL HIGH (ref 38–234)

## 2022-03-09 MED ORDER — IOHEXOL 9 MG/ML PO SOLN
ORAL | Status: AC
  Filled 2022-03-09: qty 1000

## 2022-03-09 MED ORDER — AMOXICILLIN-POT CLAVULANATE 875-125 MG PO TABS: 1.0000 | ORAL_TABLET | Freq: Two times a day (BID) | ORAL | 0 refills | Status: DC

## 2022-03-09 MED ORDER — IOHEXOL 300 MG/ML  SOLN
100.0000 mL | Freq: Once | INTRAMUSCULAR | Status: AC | PRN
  Administered 2022-03-09: 100 mL via INTRAVENOUS

## 2022-03-09 MED ORDER — IOHEXOL 9 MG/ML PO SOLN
500.0000 mL | ORAL | Status: AC
  Administered 2022-03-09 (×2): 500 mL via ORAL

## 2022-03-09 NOTE — Progress Notes (Signed)
Orthopedic Tech Progress Note Patient Details:  Amy Villa 1966-05-04 [id number]  Ortho Devices Type of Ortho Device: Velcro wrist splint Ortho Device/Splint Location: left hand Ortho Device/Splint Interventions: Ordered, Application, Adjustment, Removal   Post Interventions Patient Tolerated: Well Instructions Provided: Adjustment of device, Care of device  Mildreth Reek OTR/L 03/09/2022, 11:39 AM

## 2022-03-09 NOTE — TOC Initial Note (Signed)
Transition of Care [facility]) - Initial/Assessment Note    Patient Details  Name: SERENITY BATLEY MRN: [id number] Date of Birth: Jul 18, 1966  Transition of [facility]) CM/SW Contact:    Roseanne Kaufman, RN Phone Number: 03/09/2022, 2:48 PM  Clinical Narrative:   Attached substance abuse resources to AVS at d/c.   No additional TOC needs at this time.                      Patient Goals and CMS Choice        Expected Discharge Plan and Services           Expected Discharge Date: 03/09/22                                    Prior Living Arrangements/Services                       Activities of Daily Living Home Assistive Devices/Equipment: None ADL Screening (condition at time of admission) Patient's cognitive ability adequate to safely complete daily activities?: Yes Is the patient deaf or have difficulty hearing?: No Does the patient have difficulty seeing, even when wearing glasses/contacts?: No Does the patient have difficulty concentrating, remembering, or making decisions?: Yes Patient able to express need for assistance with ADLs?: Yes Does the patient have difficulty dressing or bathing?: No Independently performs ADLs?: Yes (appropriate for developmental age) Does the patient have difficulty walking or climbing stairs?: No Weakness of Legs: None Weakness of Arms/Hands: Left  Permission Sought/Granted                  Emotional Assessment              Admission diagnosis:  Hyperglycemia [R73.9] Accidental overdose of heroin, initial encounter ([place]) [T40.1X1A] Acute respiratory failure with hypoxia and hypercarbia ([place]) [J96.01, J96.02] Acute hypercapnic respiratory failure ([place]) [J96.02] Sepsis with acute organ dysfunction, due to unspecified organism, unspecified type, unspecified whether septic shock present ([place]) [A41.9, R65.20] Patient Active Problem List   Diagnosis Date Noted   Compression neuropathy 03/09/2022    Rhabdomyolysis 76/22/6333   Acute metabolic encephalopathy 54/56/2563   Acute respiratory failure with hypoxia and hypercapnia ([place]) 03/05/2022   Alcohol abuse with alcohol-induced mood disorder ([place]) 01/11/2022   Respiratory arrest ([place]) 01/09/2022   Cardiac arrest ([place]) 11/10/2019   Coagulopathy ([place]) 89/37/3428   Metabolic encephalopathy 76/81/1572   Acute respiratory failure with hypoxemia ([place]) 11/10/2019   Aspiration pneumonia ([place]) 11/10/2019   AKI (acute kidney injury) ([place]) 62/09/5595   Metabolic acidosis 41/63/8453   Hyperkalemia 11/10/2019   Hypothermia 11/10/2019   AMS (altered mental status) 09/16/2019   GI bleed 02/23/2018   Substance abuse ([place]) 03/13/2017   Hypokalemia 03/13/2017   Aortic atherosclerosis ([place]) 12/01/2016   Hepatic steatosis 12/01/2016   Insomnia 12/01/2016   Anxiety and depression 12/01/2016   Liver cirrhosis ([place]) 11/16/2016   Nicotine dependence 07/11/2013   DJD (degenerative joint disease) 07/11/2013   DDD (degenerative disc disease) 07/11/2013   Normocytic anemia 07/11/2013   Narcotic abuse ([place]) 07/11/2013   CMC arthritis, thumb, degenerative 06/23/2013   Alcohol abuse 09/02/2010   Benign essential hypertension 06/09/2005   PCP:  Patient, No Pcp Per Pharmacy:   [id number] - [place], [place] - [address] [place]  [address] Phone: [phone] Fax: [phone]  [facility] [address] [place] [address] Phone: [phone] Fax: [phone]     Social Determinants of Health (SDOH) Interventions    Readmission Risk Interventions    11/14/2019   11:36 AM  Readmission Risk Prevention Plan  Transportation Screening Complete  PCP or Specialist Appt within 3-5 Days Complete  HRI or [place] Complete  Social Work Consult for [place] Planning/Counseling Complete  Palliative Care Screening Not  Applicable  Medication Review Press photographer) Complete

## 2022-03-09 NOTE — Assessment & Plan Note (Signed)
-   Continue antibiotics day 3 of 5

## 2022-03-09 NOTE — Progress Notes (Signed)
Occupational Therapy Treatment Patient Details Name: Amy Villa MRN: [id number] DOB: [birth year] Today's Date: 03/09/2022   History of present illness Patient is a 56 year old female who was found unresponsive at motel with narcan administered. patient was admitted with acute hypercapnic respiratory failure,acute metabolic encephalopathy, polysubstanec abuse and placed on BiPAP in ICU. patient transiioned out of ICU on 8/18. patient noted to have LUE weakness with LUE wrist drop. BPZ:WCHE, esophageal verices,   OT comments  Patient was educated on splint use and importance of outpatient OT for L hand to maintain function while awaiting movement to return. Patient verbalized understanding. Patient reported plan for d/c today after CT scan. Patient's discharge plan remains appropriate at this time. OT will continue to follow acutely.     Recommendations for follow up therapy are one component of a multi-disciplinary discharge planning process, led by the attending physician.  Recommendations may be updated based on patient status, additional functional criteria and insurance authorization.    Follow Up Recommendations  Outpatient OT    Assistance Recommended at Discharge PRN  Patient can return home with the following  Assistance with cooking/housework;Assist for transportation;Help with stairs or ramp for entrance   Equipment Recommendations       Recommendations for Other Services      Precautions / Restrictions Precautions Precaution Comments: wrist drop L, monitor O2 Restrictions Weight Bearing Restrictions: No       Mobility Bed Mobility                    Transfers                         Balance                                           ADL either performed or assessed with clinical judgement   ADL                                         General ADL Comments: patient was educated on wrist cock up splint  and importance of donning/doffing 4 hours on 4 hours off to give skin a break. patient was educated on extensive time for nerve injuries to heal and needing to give self time for this. patient was educated on areas at risk of high pressure as well. patient verbalized understanding. patient was able to complete gentle P/AAROM of LUE on this date to maintain ROM. patient reported plan was to d/c home today. patient was educated on importance of following up outpatient for L wrist. patient verbalized understanding.    Extremity/Trunk Assessment              Vision       Perception     Praxis      Cognition Arousal/Alertness: Awake/alert Behavior During Therapy: WFL for tasks assessed/performed Overall Cognitive Status: Within Functional Limits for tasks assessed                                          Exercises General Exercises - Upper Extremity Composite Extension: PROM, 5 reps (with education provided for LUE movements to [employer]  ROM. ephasis on not forcing movements)    Shoulder Instructions       General Comments      Pertinent Vitals/ Pain       Pain Assessment Pain Assessment: No/denies pain  Home Living                                          Prior Functioning/Environment              Frequency  Min 2X/week        Progress Toward Goals  OT Goals(current goals can now be found in the care plan section)  Progress towards OT goals: Progressing toward goals     Plan      Co-evaluation                 AM-PAC OT "6 Clicks" Daily Activity     Outcome Measure   Help from another person eating meals?: A Little Help from another person taking care of personal grooming?: A Little Help from another person toileting, which includes using toliet, bedpan, or urinal?: A Little Help from another person bathing (including washing, rinsing, drying)?: A Little Help from another person to put on and taking off  regular upper body clothing?: None Help from another person to put on and taking off regular lower body clothing?: A Little 6 Click Score: 19    End of Session Equipment Utilized During Treatment: Other (comment) (wrist cock up splint)      Activity Tolerance Patient tolerated treatment well   Patient Left in chair;with call bell/phone within reach   Nurse Communication Other (comment) (comunications about splint)        Time: 4431-5400 OT Time Calculation (min): 18 min  Charges: OT General Charges $OT Visit: 1 Visit OT Treatments $Therapeutic Activity: 8-22 mins  Jackelyn Poling OTR/L, MS Acute Rehabilitation Department Office# [phone] Pager# [phone]   Marcellina Millin 03/09/2022, 12:46 PM

## 2022-03-09 NOTE — Progress Notes (Deleted)
error 

## 2022-03-09 NOTE — Discharge Summary (Signed)
Physician Discharge Summary   Patient: Amy Villa MRN: [id number] DOB: 02-09-1966  Admit date:     03/05/2022  Discharge date: 03/09/22  Discharge Physician: Edwin Dada   PCP: Patient, No Pcp Per     Recommendations at discharge:  Follow up with Oncology for work up of lytic bone lesion of undetermined etiology Follow up with Neurology for suspected compression neuropathy Follow up with PCP Dr. Jimmye Norman Dr. Jimmye Norman: Please ensure patient has Oncology follow up for lytic bone lesion of sternum on CT chest Dr. Jimmye Norman: Please follow up SPEP Follow up with Neurology for new wrist drop/NCS-EMG, referral to [facility]rology made       Discharge Diagnoses: Principal Problem:   Acute respiratory failure with hypoxia and hypercapnia (HCC) Active Problems:   Lytic bone lesion of sternum   Normocytic anemia   Liver cirrhosis ([place])   Anxiety and depression   Substance abuse ([place])   Hypokalemia   Aspiration pneumonia ([place])   AKI (acute kidney injury) ([place])   Metabolic acidosis   Alcohol abuse with alcohol-induced mood disorder (HCC)   Rhabdomyolysis   Acute metabolic encephalopathy   Compression neuropathy      Hospital Course: Mrs. Bradburn isa  56 y.o. F with hx depression, alcoholic cirrhosis, COPD not on home O2, HTN, and polysubstance abuse who presented after being found unresponsive.  Apparently given 8 mg Narcan in the field and became more alert.  Patient does not know where she was found and reports that she does not remember using illicit drugs, but EMS reported to EDP that she was found by them "at a motel having just snorted heroin".   8/17: In the ER, hypothermic and acidotic.  Started on BiPAP and admitted to ICU 8/18: Transferred [facility], Neurology consulted for left wrist drop 8/20: CT chest shows new lytic sternal lesion        Acute respiratory failure with hypoxia and hypercapnia  Patient presented with hypoxia, hypercapnia and hypoxia  on blood gas.  Placed on BiPAP and resolved. Due to substance intoxication/overdose.      Liver cirrhosis (HCC) Known cirrhosis, well compensated, seen on imaging.     Substance abuse ([place]) As above, patient denied.      Aspiration pneumonia ([place]) CXR on admission clear, but after 3 days in hospital, repeat chest imaging showed infiltrates.  Was started on Rocephin and azithromycin.  Presumably this was aspiration while she was unconscious.  Discharged to complete 5 days antibiotics with Augmentin.    AKI (acute kidney injury) ([place]) Cr 1.2 on admission, resolved to baseline 0.6 by discharge.    Rhabdomyolysis Had mild rhabdo, improving at time of discharge    Acute metabolic encephalopathy Was confused on presentation, improved with abstinence from opiates.    Compression neuropathy Noted to have a new wrist drop on while in the hospital, this was evaaluted by Neurology who suspected compression neuropathy, obtained an MRI brain that was normal and recommended outapteint Neuro follow up.  Lytic lesion of sternum CT showed an incidental lytic lesion of sternum.  Discussed with Radiology, this appears likely metastasis.  No primary identified on CT of the chest abdomen or pelvis.  She is not uptodate with mammography or colonoscopy.  SPEP sent. Clear instructions to follow up with Oncology given, and referral sent.          The [facility] Controlled Substances Registry was reviewed for this patient prior to discharge.     Disposition: Home Diet recommendation:  Discharge  Diet Orders (From admission, onward)     Start     Ordered   03/09/22 0000  Diet - low sodium heart healthy        03/09/22 1427             DISCHARGE MEDICATION: Allergies as of 03/09/2022       Reactions   Hydroxyamine Other (See Comments)   Not effective   Pregabalin Other (See Comments)   Angry, upset         Medication List     TAKE these medications     amoxicillin-clavulanate 875-125 MG tablet Commonly known as: AUGMENTIN Take 1 tablet by mouth 2 (two) times daily.   Artificial Tears 1.4 % ophthalmic solution Generic drug: polyvinyl alcohol Place 1 drop into both eyes daily as needed for dry eyes.   betamethasone dipropionate 0.05 % lotion Apply 1 Application topically 2 (two) times daily as needed for itching.   budesonide-formoterol 160-4.5 MCG/ACT inhaler Commonly known as: SYMBICORT Inhale 2 puffs into the lungs 2 (two) times daily as needed (shortness of breath).   folic acid 1 MG tablet Commonly known as: FOLVITE Take 1 tablet (1 mg total) by mouth daily.   gabapentin 800 MG tablet Commonly known as: NEURONTIN Take 800 mg by mouth 3 (three) times daily.   hydrOXYzine 50 MG tablet Commonly known as: ATARAX Take 50 mg by mouth 3 (three) times daily.   lactulose 10 GM/15ML solution Commonly known as: CHRONULAC TAKE 30ML BY MOUTH EVERY DAY What changed: See the new instructions.   lamoTRIgine 25 MG tablet Commonly known as: LAMICTAL Take 1 tablet (25 mg total) by mouth daily.   multivitamin with minerals Tabs tablet Take 1 tablet by mouth daily.   Oxcarbazepine 300 MG tablet Commonly known as: TRILEPTAL Take 300 mg by mouth 2 (two) times daily.   pantoprazole 40 MG tablet Commonly known as: PROTONIX TAKE 1 TABLET(40 MG) BY MOUTH DAILY BEFORE BREAKFAST What changed: See the new instructions.   QUEtiapine 400 MG tablet Commonly known as: SEROQUEL Take 400 mg by mouth at bedtime.   QUEtiapine 50 MG tablet Commonly known as: SEROQUEL Take 1 tablet (50 mg total) by mouth at bedtime as needed (insomnia).   tiZANidine 4 MG tablet Commonly known as: ZANAFLEX Take 4 mg by mouth 3 (three) times daily as needed for muscle spasms.   Ventolin HFA 108 (90 Base) MCG/ACT inhaler Generic drug: albuterol INHALE 2 PUFFS INTO THE LUNGS EVERY 6 HOURS AS NEEDED FOR WHEEZING OR SHORTNESS OF BREATH        Follow-up  Information     Heywood Bene, PA-C Follow up.   Specialty: Physician Assistant Contact information: [address] [place] [address] [phone]         [facility] Follow up.   Contact information: [place], [place] [employer] [phone]                Discharge Instructions     Ambulatory referral to Hematology / Oncology   Complete by: As directed    For evaluation of possible bony metastasis in sternum   Ambulatory referral to Neurology   Complete by: As directed    An appointment is requested in approximately: 4 weeks For peripheral neuropathy   Diet - low sodium heart healthy   Complete by: As directed    Discharge instructions   Complete by: As directed    From Dr. Loleta Books: You  were admitted for being found unresponsive.  Our testing here suggested that this was from an overdose of some sort, although we cannot definitively say You improved quickly  Your chest x-ray showed findings of pneumonia and so you were started on antibiotics You should finish the antibiotics with 3 more days Augmentin 875-125 mg, starting tonight  Your arm weakness appears to be from nerve compression This can happen when a person lays in one position for an extended period of time Go to your primary doctor as soon as possible We have sent a referral to a Neurologist to be seen, they will call you in the next week to arrange follow up If you haven't heard from them in 10 days, call the number listed below in the To Do section for [facility]  In the meantime, ask Dr. Jimmye Norman for a referral to Occupational Therapy to work on Retail banker and accommodation    Lastly, IMPORTANT: you had a CT scan that showed an abnormality in your bones. This looks like a possible cancer We did other CT imaging here and found no obvious source I have made a referral to the [facility] for more work up Go  see Dr. Jimmye Norman your primary care as soon as possible to discuss Ask Dr. Jimmye Norman to follow up your SPEP test and also to make sure you have follow up with mammogram, colonoscopy and any other age appropriate cancer screening as well as that the follow up appointment with the cancer center doesn't fall through   Increase activity slowly   Complete by: As directed        Discharge Exam: Filed Weights   03/05/22 0314 03/08/22 0356 03/09/22 0500  Weight: 58.8 kg 59.9 kg 60.3 kg    General: Pt is alert, awake, not in acute distress Cardiovascular: RRR, nl S1-S2, no murmurs appreciated.   No LE edema.   Respiratory: Normal respiratory rate and rhythm.  CTAB without rales or wheezes. Abdominal: Abdomen soft and non-tender.  No distension or HSM.   Neuro/Psych: Strength weak in the left upper extremity.  Judgment and insight appear normal.   Condition at discharge: good  The results of significant diagnostics from this hospitalization (including imaging, microbiology, ancillary and laboratory) are listed below for reference.   Imaging Studies: CT ABDOMEN PELVIS W CONTRAST  Result Date: 03/09/2022 CLINICAL DATA:  Abdominal distension and cirrhosis. Sternal body lesion shown on chest CT of 03/08/2022. EXAM: CT ABDOMEN AND PELVIS WITH CONTRAST TECHNIQUE: Multidetector CT imaging of the abdomen and pelvis was performed using the standard protocol following bolus administration of intravenous contrast. RADIATION DOSE REDUCTION: This exam was performed according to the departmental dose-optimization program which includes automated exposure control, adjustment of the mA and/or kV according to patient size and/or use of iterative reconstruction technique. CONTRAST:  126m OMNIPAQUE IOHEXOL 300 MG/ML  SOLN COMPARISON:  Multiple exams, including 03/08/2022 and 09/16/2019 FINDINGS: Despite efforts by the technologist and patient, motion artifact is present on today's exam and could not be eliminated.  This reduces exam sensitivity and specificity. Lower chest: Trace right pleural effusion. Hepatobiliary: Nodular contour the liver compatible cirrhosis. No mass lesion of the liver is identified. Gallbladder wall thickening is present but could be due to contraction of the gallbladder given the small caliber. Common bile duct within normal limits. Pancreas: Unremarkable Spleen: A medial calcification in the spleen may be from old granulomatous disease but is technically nonspecific. Upper normal splenic size. Adrenals/Urinary Tract: Unremarkable Stomach/Bowel: Unremarkable Vascular/Lymphatic:  Atherosclerosis is present, including aortoiliac atherosclerotic disease. Reproductive: Uterus absent.  Adnexa unremarkable. Other: Subcutaneous edema lateral to the left hip. Musculoskeletal: Old healed bilateral rib fractures. Nonunion of left eleventh rib fracture, chronic. Thoracolumbar spondylosis and degenerative disc disease. Oblique irregularity in the coccyx for example on image 60 of series 7, probably due to an old coccygeal fracture. Mild right foraminal stenosis due to spurring at L5-S1. IMPRESSION: 1. Hepatic cirrhosis without mass lesion in the liver identified. Gallbladder wall thickening is probably from contraction. 2. Trace right pleural effusion. 3.  Aortic Atherosclerosis (ICD10-I70.0). 4. Old healed bilateral lower rib fractures. Suspected old fracture in the coccyx with sclerotic margins, query chronic nonunion. 5. Mild right foraminal impingement at L5-S1 due to spurring. 6. Nonspecific subcutaneous edema lateral to the left hip and along the left lateral. No visual is subcutaneous abscess. Electronically Signed   By: Van Clines M.D.   On: 03/09/2022 14:17   CT CHEST WO CONTRAST  Result Date: 03/08/2022 CLINICAL DATA:  Chest wall pain.  No injury. EXAM: CT CHEST WITHOUT CONTRAST TECHNIQUE: Multidetector CT imaging of the chest was performed following the standard protocol without IV contrast.  RADIATION DOSE REDUCTION: This exam was performed according to the departmental dose-optimization program which includes automated exposure control, adjustment of the mA and/or kV according to patient size and/or use of iterative reconstruction technique. COMPARISON:  CT abdomen 02/23/2018 as well as multiple prior chest radiographs dating back to 02/25/2018 FINDINGS: Cardiovascular: Heart is normal size. There is a small pericardial effusion present. Thoracic aorta is normal in caliber. Remaining vascular structures are unremarkable. Mediastinum/Nodes: 1 cm precarinal lymph node. Few other shotty mediastinal lymph nodes are present. No hilar adenopathy. Few small calcified left hilar lymph nodes. Mediastinal structures are otherwise unremarkable. Lungs/Pleura: Mild centrilobular emphysematous disease is present. Mild focal atelectasis over the posterior right lower lobe with small associated right effusion. Mild patchy mild patchy hazy airspace attenuation with associated interstitial prominence over the right upper lobe, posterior left upper lobe as well as the right middle lobe and lingula. No focal pulmonary nodules or masses. Airways are otherwise unremarkable. Upper Abdomen: Mild calcified plaque over the abdominal aorta. Splenic granuloma over the spleen which is mildly prominent. Mild nodular contour to the liver. No acute findings. Musculoskeletal: Old inferior sternal body fracture. Well-defined lytic process over the superior aspect of the sternal body with associated pathologic fracture at this location. Mild degenerative change of the spine. IMPRESSION: 1. Mild patchy hazy airspace attenuation with associated interstitial prominence over the upper lobes, right middle lobe and lingula. Small associated right pleural effusion with associated right basilar atelectasis. Findings may be due to infectious or inflammatory process. Recommend follow-up CT 4-6 weeks. 2. Old inferior sternal body fracture.  Well-defined lytic process over the superior aspect of the sternal body with associated pathologic fracture at this location likely accounting for patient's chest wall pain. Findings may be due to metastatic disease/multiple myeloma versus and less likely infection. Recommend clinical correlation. 3. Small pericardial effusion. Aortic Atherosclerosis (ICD10-I70.0) and Emphysema (ICD10-J43.9). Electronically Signed   By: Marin Olp M.D.   On: 03/08/2022 12:01   DG Chest Port 1 View  Result Date: 03/07/2022 CLINICAL DATA:  fever. Pt reported a dry cough when taking in a deep breath. Pt presented to the hospital two days ago after being found unresponsive in a seated position with her left arm behind her at a motel after using heroin. Pt reported medical hx of COPD. EXAM: PORTABLE CHEST 1  VIEW COMPARISON:  03/05/2022 and older exams. FINDINGS: Hazy airspace opacity has developed in the right upper lobe, bordering the minor fissure, and more subtly at the medial right lung base, since the prior exam. Remainder of the lungs is clear. Cardiac silhouette normal in size and configuration. Normal mediastinal and hilar contours. No convincing pleural effusion or pneumothorax. IMPRESSION: 1. New hazy airspace lung opacity in the right upper lobe, and medial right lung base, consistent with multifocal pneumonia. Electronically Signed   By: Lajean Manes M.D.   On: 03/07/2022 09:39   MR CERVICAL SPINE WO CONTRAST  Result Date: 03/07/2022 CLINICAL DATA:  Left arm weakness and numbness EXAM: MRI CERVICAL SPINE WITHOUT CONTRAST TECHNIQUE: Multiplanar, multisequence MR imaging of the cervical spine was performed. No intravenous contrast was administered. COMPARISON:  03/29/2015 MRI cervical spine, correlation is also made with 01/08/2022 CT cervical spine FINDINGS: Alignment: Straightening and mild reversal of the normal cervical lordosis. 2 mm anterolisthesis of C4 on C5, which appears unchanged compared to 01/08/2022.  Vertebrae: No acute fracture or suspicious osseous lesion. Cord: Normal signal and morphology. Posterior Fossa, vertebral arteries, paraspinal tissues: Negative. Disc levels: C2-C3: No significant disc bulge. No spinal canal stenosis or neuroforaminal narrowing. C3-C4: No significant disc bulge. Right-greater-than-left uncovertebral hypertrophy and left-greater-than-right facet arthropathy. No spinal canal stenosis. Moderate to severe right and moderate left neural foraminal narrowing, which has progressed on the right. C4-C5: Trace anterolisthesis with disc unroofing and minimal disc bulge. Facet and uncovertebral hypertrophy. No spinal canal stenosis. Moderate to severe bilateral neural foraminal narrowing, which has progressed from the prior exam. C5-C6: Mild disc bulge. Right-greater-than-left facet arthropathy. Uncovertebral hypertrophy. Mild spinal canal stenosis, which has progressed from the prior exam. Moderate left and mild right neural foraminal narrowing, which has progressed on the left. C6-C7: Minimal disc bulge. Uncovertebral hypertrophy. No spinal canal stenosis or neural foraminal narrowing. C7-T1: No significant disc bulge. Left-greater-than-right facet arthropathy. No spinal canal stenosis or neuroforaminal narrowing. IMPRESSION: 1. C5-C6 mild spinal canal stenosis with moderate left and mild right neural foraminal narrowing. 2. C4-C5 moderate to severe bilateral neural foraminal narrowing. 3. C3-C4 moderate to severe right and moderate left neural foraminal narrowing. 4. Normal spinal cord signal and morphology. Electronically Signed   By: Merilyn Baba M.D.   On: 03/07/2022 04:17   MR BRAIN W WO CONTRAST  Result Date: 03/05/2022 CLINICAL DATA:  Acute neurologic deficit EXAM: MRI HEAD WITHOUT AND WITH CONTRAST TECHNIQUE: Multiplanar, multiecho pulse sequences of the brain and surrounding structures were obtained without and with intravenous contrast. CONTRAST:  79m GADAVIST GADOBUTROL 1  MMOL/ML IV SOLN COMPARISON:  None Available. FINDINGS: Brain: No acute infarct, mass effect or extra-axial collection. No acute or chronic hemorrhage. Normal white matter signal, parenchymal volume and CSF spaces. The midline structures are normal. Vascular: Major flow voids are preserved. Skull and upper cervical spine: Normal calvarium and skull base. Visualized upper cervical spine and soft tissues are normal. Sinuses/Orbits:No paranasal sinus fluid levels or advanced mucosal thickening. No mastoid or middle ear effusion. Normal orbits. IMPRESSION: Normal brain MRI. Electronically Signed   By: KUlyses JarredM.D.   On: 03/05/2022 22:27   CT HEAD WO CONTRAST (5MM)  Result Date: 03/05/2022 CLINICAL DATA:  Overdose with altered mental status. EXAM: CT HEAD WITHOUT CONTRAST TECHNIQUE: Contiguous axial images were obtained from the base of the skull through the vertex without intravenous contrast. RADIATION DOSE REDUCTION: This exam was performed according to the departmental dose-optimization program which includes automated exposure control, adjustment  of the mA and/or kV according to patient size and/or use of iterative reconstruction technique. COMPARISON:  January 08, 2022 FINDINGS: Brain: No evidence of acute infarction, hemorrhage, hydrocephalus, extra-axial collection or mass lesion/mass effect. Vascular: No hyperdense vessel or unexpected calcification. Skull: Normal. Negative for fracture or focal lesion. Sinuses/Orbits: No acute finding. Other: Mild right frontal scalp soft tissue swelling is noted. IMPRESSION: 1. No acute intracranial abnormality. Electronically Signed   By: Virgina Norfolk M.D.   On: 03/05/2022 04:19   DG Chest Port 1 View  Result Date: 03/05/2022 CLINICAL DATA:  Possible sepsis, question overdose EXAM: PORTABLE CHEST 1 VIEW COMPARISON:  01/09/2022 FINDINGS: Cardiac shadow is within normal limits. The lungs are clear bilaterally. No free air is noted in the upper abdomen. No bony  abnormality is seen. IMPRESSION: No acute abnormality noted. Electronically Signed   By: Inez Catalina M.D.   On: 03/05/2022 03:36    Microbiology: Results for orders placed or performed during the hospital encounter of 03/05/22  Blood Culture (routine x 2)     Status: None (Preliminary result)   Collection Time: 03/05/22  3:16 AM   Specimen: BLOOD  Result Value Ref Range Status   Specimen Description   Final    BLOOD SITE NOT SPECIFIED Performed at [facility] [address], [place], [place] [address]    Special Requests   Final    BOTTLES DRAWN AEROBIC AND ANAEROBIC Blood Culture results may not be optimal due to an inadequate volume of blood received in culture bottles Performed at [facility] [address], [place], [place] [address]    Culture   Final    NO GROWTH 4 DAYS Performed at [facility], [place] [address], [place], [place] [address]    Report Status PENDING  Incomplete  SARS Coronavirus 2 by RT PCR (hospital order, performed in [facility] hospital lab) *cepheid single result test* Anterior Nasal Swab     Status: None   Collection Time: 03/05/22  3:41 AM   Specimen: Anterior Nasal Swab  Result Value Ref Range Status   SARS Coronavirus 2 by RT PCR NEGATIVE NEGATIVE Final    Comment: (NOTE) SARS-CoV-2 target nucleic acids are NOT DETECTED.  The SARS-CoV-2 RNA is generally detectable in upper and lower respiratory specimens during the acute phase of infection. The lowest concentration of SARS-CoV-2 viral copies this assay can detect is 250 copies / mL. A negative result does not preclude SARS-CoV-2 infection and should not be used as the sole basis for treatment or other patient management decisions.  A negative result may occur with improper specimen collection / handling, submission of specimen other than nasopharyngeal swab, presence of viral mutation(s) within the areas targeted by this assay, and inadequate number of  viral copies (<250 copies / mL). A negative result must be combined with clinical observations, patient history, and epidemiological information.  Fact Sheet for Patients:   https://www.patel.info/  Fact Sheet for Healthcare Providers: https://hall.com/  This test is not yet approved or  cleared by the [place] FDA and has been authorized for detection and/or diagnosis of SARS-CoV-2 by FDA under an Emergency Use Authorization (EUA).  This EUA will remain in effect (meaning this test can be used) for the duration of the COVID-19 declaration under Section 564(b)(1) of the Act, 21 U.S.C. section 360bbb-3(b)(1), unless the authorization is terminated or revoked sooner.  Performed at [facility], [place] [address], [place], [place] [address]  Resp Panel by RT-PCR (Flu A&B, Covid) Anterior Nasal Swab     Status: None   Collection Time: 03/05/22  3:41 AM   Specimen: Anterior Nasal Swab  Result Value Ref Range Status   SARS Coronavirus 2 by RT PCR NEGATIVE NEGATIVE Final    Comment: (NOTE) SARS-CoV-2 target nucleic acids are NOT DETECTED.  The SARS-CoV-2 RNA is generally detectable in upper respiratory specimens during the acute phase of infection. The lowest concentration of SARS-CoV-2 viral copies this assay can detect is 138 copies/mL. A negative result does not preclude SARS-Cov-2 infection and should not be used as the sole basis for treatment or other patient management decisions. A negative result may occur with  improper specimen collection/handling, submission of specimen other than nasopharyngeal swab, presence of viral mutation(s) within the areas targeted by this assay, and inadequate number of viral copies(<138 copies/mL). A negative result must be combined with clinical observations, patient history, and epidemiological information. The expected result is Negative.  Fact Sheet for Patients:   EntrepreneurPulse.com.au  Fact Sheet for Healthcare Providers:  IncredibleEmployment.be  This test is no t yet approved or cleared by the [place] FDA and  has been authorized for detection and/or diagnosis of SARS-CoV-2 by FDA under an Emergency Use Authorization (EUA). This EUA will remain  in effect (meaning this test can be used) for the duration of the COVID-19 declaration under Section 564(b)(1) of the Act, 21 U.S.C.section 360bbb-3(b)(1), unless the authorization is terminated  or revoked sooner.       Influenza A by PCR NEGATIVE NEGATIVE Final   Influenza B by PCR NEGATIVE NEGATIVE Final    Comment: (NOTE) The Xpert Xpress SARS-CoV-2/FLU/RSV plus assay is intended as an aid in the diagnosis of influenza from Nasopharyngeal swab specimens and should not be used as a sole basis for treatment. Nasal washings and aspirates are unacceptable for Xpert Xpress SARS-CoV-2/FLU/RSV testing.  Fact Sheet for Patients: EntrepreneurPulse.com.au  Fact Sheet for Healthcare Providers: [email or web address]  This test is not yet approved or cleared by the [place] FDA and has been authorized for detection and/or diagnosis of SARS-CoV-2 by FDA under an Emergency Use Authorization (EUA). This EUA will remain in effect (meaning this test can be used) for the duration of the COVID-19 declaration under Section 564(b)(1) of the Act, 21 U.S.C. section 360bbb-3(b)(1), unless the authorization is terminated or revoked.  Performed at [facility], [place] [address], [place], [place] [address]   Urine Culture     Status: None   Collection Time: 03/05/22  5:46 AM   Specimen: In/Out Cath Urine  Result Value Ref Range Status   Specimen Description   Final    IN/OUT CATH URINE Performed at [place] [address], [place], [place] [address]    Special Requests   Final     Normal Performed at [facility], [place] [address] [place], [place] [address]    Culture   Final    NO GROWTH Performed at [facility], [place] [address], [place], [place] [address]    Report Status 03/06/2022 FINAL  Final  MRSA Next Gen by PCR, Nasal     Status: None   Collection Time: 03/05/22  6:20 AM   Specimen: Nasal Mucosa; Nasal Swab  Result Value Ref Range Status   MRSA by PCR Next Gen NOT DETECTED NOT DETECTED Final    Comment: (NOTE) The GeneXpert MRSA Assay (FDA approved for NASAL specimens only), is one component  of a comprehensive MRSA colonization surveillance program. It is not intended to diagnose MRSA infection nor to guide or monitor treatment for MRSA infections. Test performance is not FDA approved in patients less than 95 years old. Performed at [facility], [place] [address], [place], [place] [address]   Blood Culture (routine x 2)     Status: None (Preliminary result)   Collection Time: 03/05/22 11:22 AM   Specimen: BLOOD  Result Value Ref Range Status   Specimen Description   Final    BLOOD BLOOD RIGHT HAND Performed at [place] [address], [place], [place] [address]    Special Requests   Final    BOTTLES DRAWN AEROBIC ONLY Blood Culture adequate volume Performed at [place] [address] [place], [place] [address]    Culture   Final    NO GROWTH 4 DAYS Performed at [facility], [place] [address], [place],  [address]    Report Status PENDING  Incomplete    Labs: CBC: Recent Labs  Lab 03/05/22 0320 03/05/22 0620 03/06/22 0318 03/07/22 0457 03/08/22 0636 03/09/22 0641  WBC 20.1* 14.2* 6.5 7.7 6.9 7.1  NEUTROABS 17.3*  --   --   --   --   --   HGB 14.5 11.6* 11.5* 11.5* 11.4* 12.2  HCT 46.1* 35.5* 35.1* 34.8* 34.0* 37.0  MCV 99.4 93.2 95.1 93.3 93.4 93.2  PLT 251 196 147* 146* 155 410   Basic Metabolic Panel: Recent Labs  Lab  03/05/22 0320 03/05/22 0620 03/06/22 0318 03/07/22 0457 03/08/22 0636 03/09/22 0641  NA 140  --  142 140 140 141  K 5.1  --  3.7 3.3* 3.4* 4.1  CL 103  --  108 108 112* 110  CO2 21*  --  _0 GLUCOSE 340*  --  99 106* 100* 112*  BUN 12  --  6 5* <5* 5*  CREATININE 1.18* 0.90 0.69 0.62 0.57 0.68  CALCIUM 9.1  --  8.5* 8.3* 8.3* 8.9   Liver Function Tests: Recent Labs  Lab 03/05/22 0320  AST 42*  ALT 29  ALKPHOS 109  BILITOT 0.4  PROT 8.2*  ALBUMIN 4.6   CBG: Recent Labs  Lab 03/08/22 1951 03/08/22 2332 03/09/22 0457 03/09/22 0756 03/09/22 1046  GLUCAP 117* 140* 127* 101* 113*    Discharge time spent: approximately 35 minutes spent on discharge counseling, evaluation of patient on day of discharge, and coordination of discharge planning with nursing, social work, pharmacy and case management  Signed: Edwin Dada, MD [facility] 03/09/2022

## 2022-03-10 LAB — KAPPA/LAMBDA LIGHT CHAINS
Kappa free light chain: 20.4 mg/L — ABNORMAL HIGH (ref 3.3–19.4)
Kappa, lambda light chain ratio: 1.17 (ref 0.26–1.65)
Lambda free light chains: 17.5 mg/L (ref 5.7–26.3)

## 2022-03-10 LAB — CULTURE, BLOOD (ROUTINE X 2)
Culture: NO GROWTH
Culture: NO GROWTH
Special Requests: ADEQUATE

## 2022-03-11 LAB — PROTEIN ELECTROPHORESIS, SERUM
A/G Ratio: 0.9 (ref 0.7–1.7)
Albumin ELP: 3 g/dL (ref 2.9–4.4)
Alpha-1-Globulin: 0.4 g/dL (ref 0.0–0.4)
Alpha-2-Globulin: 1 g/dL (ref 0.4–1.0)
Beta Globulin: 1 g/dL (ref 0.7–1.3)
Gamma Globulin: 0.7 g/dL (ref 0.4–1.8)
Globulin, Total: 3.2 g/dL (ref 2.2–3.9)
Total Protein ELP: 6.2 g/dL (ref 6.0–8.5)

## 2022-03-12 ENCOUNTER — Encounter: Payer: Self-pay | Admitting: Neurology

## 2022-04-27 ENCOUNTER — Emergency Department (HOSPITAL_COMMUNITY): Payer: BLUE CROSS/BLUE SHIELD

## 2022-04-27 ENCOUNTER — Other Ambulatory Visit: Payer: Self-pay

## 2022-04-27 ENCOUNTER — Inpatient Hospital Stay (HOSPITAL_COMMUNITY)
Admission: EM | Admit: 2022-04-27 | Discharge: 2022-05-02 | DRG: 871 | Disposition: A | Discharge status: Home / Self Care | Payer: BLUE CROSS/BLUE SHIELD | Attending: Internal Medicine | Admitting: Internal Medicine

## 2022-04-27 DIAGNOSIS — K7030 Alcoholic cirrhosis of liver without ascites: Secondary | ICD-10-CM | POA: Diagnosis present

## 2022-04-27 DIAGNOSIS — Z1152 Encounter for screening for COVID-19: Secondary | ICD-10-CM

## 2022-04-27 DIAGNOSIS — Z801 Family history of malignant neoplasm of trachea, bronchus and lung: Secondary | ICD-10-CM

## 2022-04-27 DIAGNOSIS — R278 Other lack of coordination: Secondary | ICD-10-CM | POA: Diagnosis present

## 2022-04-27 DIAGNOSIS — K7682 Hepatic encephalopathy: Secondary | ICD-10-CM | POA: Diagnosis present

## 2022-04-27 DIAGNOSIS — Z8614 Personal history of Methicillin resistant Staphylococcus aureus infection: Secondary | ICD-10-CM

## 2022-04-27 DIAGNOSIS — E875 Hyperkalemia: Secondary | ICD-10-CM

## 2022-04-27 DIAGNOSIS — R6520 Severe sepsis without septic shock: Secondary | ICD-10-CM | POA: Diagnosis present

## 2022-04-27 DIAGNOSIS — R4182 Altered mental status, unspecified: Secondary | ICD-10-CM | POA: Diagnosis present

## 2022-04-27 DIAGNOSIS — F319 Bipolar disorder, unspecified: Secondary | ICD-10-CM | POA: Diagnosis present

## 2022-04-27 DIAGNOSIS — Z8249 Family history of ischemic heart disease and other diseases of the circulatory system: Secondary | ICD-10-CM

## 2022-04-27 DIAGNOSIS — F1110 Opioid abuse, uncomplicated: Secondary | ICD-10-CM | POA: Diagnosis present

## 2022-04-27 DIAGNOSIS — F10131 Alcohol abuse with withdrawal delirium: Secondary | ICD-10-CM | POA: Diagnosis not present

## 2022-04-27 DIAGNOSIS — Z833 Family history of diabetes mellitus: Secondary | ICD-10-CM

## 2022-04-27 DIAGNOSIS — E876 Hypokalemia: Secondary | ICD-10-CM | POA: Diagnosis not present

## 2022-04-27 DIAGNOSIS — Z90710 Acquired absence of both cervix and uterus: Secondary | ICD-10-CM

## 2022-04-27 DIAGNOSIS — G9341 Metabolic encephalopathy: Secondary | ICD-10-CM | POA: Diagnosis present

## 2022-04-27 DIAGNOSIS — Z888 Allergy status to other drugs, medicaments and biological substances status: Secondary | ICD-10-CM

## 2022-04-27 DIAGNOSIS — T50904A Poisoning by unspecified drugs, medicaments and biological substances, undetermined, initial encounter: Secondary | ICD-10-CM

## 2022-04-27 DIAGNOSIS — R0603 Acute respiratory distress: Secondary | ICD-10-CM | POA: Diagnosis not present

## 2022-04-27 DIAGNOSIS — T401X4A Poisoning by heroin, undetermined, initial encounter: Secondary | ICD-10-CM | POA: Diagnosis present

## 2022-04-27 DIAGNOSIS — A419 Sepsis, unspecified organism: Secondary | ICD-10-CM | POA: Diagnosis not present

## 2022-04-27 DIAGNOSIS — J439 Emphysema, unspecified: Secondary | ICD-10-CM | POA: Diagnosis present

## 2022-04-27 DIAGNOSIS — F4310 Post-traumatic stress disorder, unspecified: Secondary | ICD-10-CM | POA: Diagnosis present

## 2022-04-27 DIAGNOSIS — Z83719 Family history of colon polyps, unspecified: Secondary | ICD-10-CM

## 2022-04-27 DIAGNOSIS — N179 Acute kidney failure, unspecified: Secondary | ICD-10-CM | POA: Diagnosis present

## 2022-04-27 DIAGNOSIS — F411 Generalized anxiety disorder: Secondary | ICD-10-CM | POA: Diagnosis present

## 2022-04-27 DIAGNOSIS — J189 Pneumonia, unspecified organism: Secondary | ICD-10-CM | POA: Diagnosis present

## 2022-04-27 DIAGNOSIS — T50901A Poisoning by unspecified drugs, medicaments and biological substances, accidental (unintentional), initial encounter: Secondary | ICD-10-CM | POA: Diagnosis present

## 2022-04-27 DIAGNOSIS — J9601 Acute respiratory failure with hypoxia: Secondary | ICD-10-CM | POA: Diagnosis present

## 2022-04-27 DIAGNOSIS — K219 Gastro-esophageal reflux disease without esophagitis: Secondary | ICD-10-CM | POA: Diagnosis present

## 2022-04-27 DIAGNOSIS — Z87891 Personal history of nicotine dependence: Secondary | ICD-10-CM

## 2022-04-27 DIAGNOSIS — I10 Essential (primary) hypertension: Secondary | ICD-10-CM | POA: Diagnosis present

## 2022-04-27 DIAGNOSIS — Z79899 Other long term (current) drug therapy: Secondary | ICD-10-CM

## 2022-04-27 DIAGNOSIS — J690 Pneumonitis due to inhalation of food and vomit: Secondary | ICD-10-CM | POA: Diagnosis present

## 2022-04-27 LAB — COMPREHENSIVE METABOLIC PANEL
ALT: 22 U/L (ref 0–44)
AST: 38 U/L (ref 15–41)
Albumin: 5 g/dL (ref 3.5–5.0)
Alkaline Phosphatase: 101 U/L (ref 38–126)
Anion gap: 19 — ABNORMAL HIGH (ref 5–15)
BUN: 13 mg/dL (ref 6–20)
CO2: 22 mmol/L (ref 22–32)
Calcium: 9.5 mg/dL (ref 8.9–10.3)
Chloride: 91 mmol/L — ABNORMAL LOW (ref 98–111)
Creatinine, Ser: 2.49 mg/dL — ABNORMAL HIGH (ref 0.44–1.00)
GFR, Estimated: 22 mL/min — ABNORMAL LOW (ref >60)
Glucose, Bld: 128 mg/dL — ABNORMAL HIGH (ref 70–99)
Potassium: 5.8 mmol/L — ABNORMAL HIGH (ref 3.5–5.1)
Sodium: 132 mmol/L — ABNORMAL LOW (ref 135–145)
Total Bilirubin: 0.7 mg/dL (ref 0.3–1.2)
Total Protein: 8.9 g/dL — ABNORMAL HIGH (ref 6.5–8.1)

## 2022-04-27 LAB — CK: Total CK: 89 U/L (ref 38–234)

## 2022-04-27 LAB — CBC WITH DIFFERENTIAL/PLATELET
Abs Immature Granulocytes: 0.6 10*3/uL — ABNORMAL HIGH (ref 0.00–0.07)
Basophils Absolute: 0.1 10*3/uL (ref 0.0–0.1)
Basophils Relative: 0 %
Eosinophils Absolute: 0 10*3/uL (ref 0.0–0.5)
Eosinophils Relative: 0 %
HCT: 49.5 % — ABNORMAL HIGH (ref 36.0–46.0)
Hemoglobin: 16 g/dL — ABNORMAL HIGH (ref 12.0–15.0)
Immature Granulocytes: 1 %
Lymphocytes Relative: 2 %
Lymphs Abs: 1.1 10*3/uL (ref 0.7–4.0)
MCH: 29 pg (ref 26.0–34.0)
MCHC: 32.3 g/dL (ref 30.0–36.0)
MCV: 89.7 fL (ref 80.0–100.0)
Monocytes Absolute: 2.1 10*3/uL — ABNORMAL HIGH (ref 0.1–1.0)
Monocytes Relative: 5 %
Neutro Abs: 39.1 10*3/uL — ABNORMAL HIGH (ref 1.7–7.7)
Neutrophils Relative %: 92 %
Platelets: 324 10*3/uL (ref 150–400)
RBC: 5.52 MIL/uL — ABNORMAL HIGH (ref 3.87–5.11)
RDW: 13.2 % (ref 11.5–15.5)
WBC: 43 10*3/uL — ABNORMAL HIGH (ref 4.0–10.5)
nRBC: 0 % (ref 0.0–0.2)

## 2022-04-27 LAB — I-STAT CHEM 8, ED
BUN: 19 mg/dL (ref 6–20)
Calcium, Ion: 1.1 mmol/L — ABNORMAL LOW (ref 1.15–1.40)
Chloride: 93 mmol/L — ABNORMAL LOW (ref 98–111)
Creatinine, Ser: 2.4 mg/dL — ABNORMAL HIGH (ref 0.44–1.00)
Glucose, Bld: 125 mg/dL — ABNORMAL HIGH (ref 70–99)
HCT: 54 % — ABNORMAL HIGH (ref 36.0–46.0)
Hemoglobin: 18.4 g/dL — ABNORMAL HIGH (ref 12.0–15.0)
Potassium: 5.9 mmol/L — ABNORMAL HIGH (ref 3.5–5.1)
Sodium: 134 mmol/L — ABNORMAL LOW (ref 135–145)
TCO2: 31 mmol/L (ref 22–32)

## 2022-04-27 LAB — I-STAT BETA HCG BLOOD, ED (MC, WL, AP ONLY): I-stat hCG, quantitative: <5 m[IU]/mL (ref <5)

## 2022-04-27 LAB — SALICYLATE LEVEL: Salicylate Lvl: <7 mg/dL — ABNORMAL LOW (ref 7.0–30.0)

## 2022-04-27 LAB — ACETAMINOPHEN LEVEL: Acetaminophen (Tylenol), Serum: <10 ug/mL — ABNORMAL LOW (ref 10–30)

## 2022-04-27 LAB — ETHANOL: Alcohol, Ethyl (B): <10 mg/dL (ref <10)

## 2022-04-27 MED ORDER — VANCOMYCIN HCL IN DEXTROSE 1-5 GM/200ML-% IV SOLN
1000.0000 mg | Freq: Once | INTRAVENOUS | Status: AC
  Administered 2022-04-27: 1000 mg via INTRAVENOUS
  Filled 2022-04-27: qty 200

## 2022-04-27 MED ORDER — METRONIDAZOLE 500 MG/100ML IV SOLN
500.0000 mg | Freq: Once | INTRAVENOUS | Status: AC
  Administered 2022-04-27: 500 mg via INTRAVENOUS
  Filled 2022-04-27: qty 100

## 2022-04-27 MED ORDER — LACTATED RINGERS IV BOLUS (SEPSIS)
1000.0000 mL | Freq: Once | INTRAVENOUS | Status: AC
  Administered 2022-04-27: 1000 mL via INTRAVENOUS

## 2022-04-27 MED ORDER — LACTATED RINGERS IV SOLN: INTRAVENOUS | Status: DC

## 2022-04-27 MED ORDER — VANCOMYCIN VARIABLE DOSE PER UNSTABLE RENAL FUNCTION (PHARMACIST DOSING): Status: DC

## 2022-04-27 MED ORDER — SODIUM CHLORIDE 0.9 % IV SOLN
2.0000 g | INTRAVENOUS | Status: DC
  Administered 2022-04-28: 2 g via INTRAVENOUS
  Filled 2022-04-27: qty 12.5

## 2022-04-27 MED ORDER — SODIUM CHLORIDE 0.9 % IV SOLN
2.0000 g | Freq: Once | INTRAVENOUS | Status: AC
  Administered 2022-04-27: 2 g via INTRAVENOUS
  Filled 2022-04-27: qty 12.5

## 2022-04-27 MED ORDER — CALCIUM GLUCONATE-NACL 1-0.675 GM/50ML-% IV SOLN
1.0000 g | Freq: Once | INTRAVENOUS | Status: AC
  Administered 2022-04-27: 1000 mg via INTRAVENOUS
  Filled 2022-04-27: qty 50

## 2022-04-27 MED ORDER — SODIUM CHLORIDE 0.9 % IV BOLUS
1000.0000 mL | Freq: Once | INTRAVENOUS | Status: AC
  Administered 2022-04-27: 1000 mL via INTRAVENOUS

## 2022-04-27 NOTE — Progress Notes (Signed)
Pharmacy Antibiotic Note  Amy Villa is a 56 y.o. female admitted on 04/27/2022 presenting with respiratory distress.  Pharmacy has been consulted for vancomycin and cefepime dosing.  Plan: Vancomycin 1000 mg IV x 1, then variable dosing due to unstable renal function Cefepime 2g IV every 24 hours Monitor renal function, Cx and clinical progression to narrow Vancomycin levels as needed     Temp (24hrs), Avg:96.6 F (35.9 C), Min:96.6 F (35.9 C), Max:96.6 F (35.9 C)  Recent Labs  Lab 04/27/22 2220 04/27/22 2233  WBC 43.0*  --   CREATININE  --  2.40*    CrCl cannot be calculated (Unknown ideal weight.).    Allergies  Allergen Reactions   Hydroxyamine Other (See Comments)    Not effective   Pregabalin Other (See Comments)    Angry, upset     Bertis Ruddy, PharmD Clinical Pharmacist ED Pharmacist Phone # [phone] 04/27/2022 10:57 PM

## 2022-04-27 NOTE — Sepsis Progress Note (Signed)
Elink monitoring for the code sepsis protocol.  

## 2022-04-27 NOTE — ED Provider Notes (Signed)
[facility] Emergency Department Provider Note MRN:  [id number]  Arrival date & time: 04/27/22     Chief Complaint   Drug Overdose   History of Present Illness   Amy Villa is a 56 y.o. year-old female presents to the ED with chief complaint of OD.  EMS was called out this morning to the patient's house this morning for OD. Was given narcan with good response and patient refused treatment to the ED.  Family then again found patient slumped over the side of the couch and EMS was again called.  Was given narcan again, but with less response.  Patient has had mild respiratory distress with EMS requiring non-rebreather.  Has had posturing of the left arm.  No documented hx of seizure.  History provided by patient. {RB interpreter (Optional):27221}  Review of Systems  Pertinent positive and negative review of systems noted in HPI.    Physical Exam   Vitals:   04/27/22 2224 04/27/22 2230  BP: (!) 121/105 109/67  Pulse: (!) 122 (!) 120  Resp:  18  Temp: (!) 96.6 F (35.9 C)   SpO2: 97% 96%    CONSTITUTIONAL:  unwell/obtunded-appearing, NAD NEURO:  GCS 9, maintaining flexion of left upper arm EYES:  eyes equal and reactive ENT/NECK:  Supple, no stridor  CARDIO:  tachycardic, regular rhythm, cool extremities, diminished distal pulses PULM:  Tachypnea, rhonchi, coarse cough GI/GU:  non-distended, non-tender MSK/SPINE:  No gross deformities, no edema, moves all extremities  SKIN:  no rash, atraumatic   *Additional and/or pertinent findings included in MDM below  Diagnostic and Interventional Summary    EKG Interpretation  Date/Time:  Monday April 27 2022 22:21:51 EDT Ventricular Rate:  121 PR Interval:  167 QRS Duration: 78 QT Interval:  311 QTC Calculation: 442 R Axis:   72 Text Interpretation: Sinus tachycardia Anteroseptal infarct, old Since last tracing rate faster Confirmed by Wandra Arthurs [phone]) on 04/27/2022 10:34:20 PM        Labs Reviewed  CBC WITH DIFFERENTIAL/PLATELET - Abnormal; Notable for the following components:      Result Value   WBC 43.0 (*)    RBC 5.52 (*)    Hemoglobin 16.0 (*)    HCT 49.5 (*)    Neutro Abs 39.1 (*)    Monocytes Absolute 2.1 (*)    Abs Immature Granulocytes 0.60 (*)    All other components within normal limits  COMPREHENSIVE METABOLIC PANEL - Abnormal; Notable for the following components:   Sodium 132 (*)    Potassium 5.8 (*)    Chloride 91 (*)    Glucose, Bld 128 (*)    Creatinine, Ser 2.49 (*)    Total Protein 8.9 (*)    GFR, Estimated 22 (*)    Anion gap 19 (*)    All other components within normal limits  SALICYLATE LEVEL - Abnormal; Notable for the following components:   Salicylate Lvl <3.3 (*)    All other components within normal limits  ACETAMINOPHEN LEVEL - Abnormal; Notable for the following components:   Acetaminophen (Tylenol), Serum <10 (*)    All other components within normal limits  I-STAT CHEM 8, ED - Abnormal; Notable for the following components:   Sodium 134 (*)    Potassium 5.9 (*)    Chloride 93 (*)    Creatinine, Ser 2.40 (*)    Glucose, Bld 125 (*)    Calcium, Ion 1.10 (*)    Hemoglobin 18.4 (*)    HCT  54.0 (*)    All other components within normal limits  CULTURE, BLOOD (SINGLE)  RESP PANEL BY RT-PCR (FLU A&B, COVID) ARPGX2  ETHANOL  CK  RAPID URINE DRUG SCREEN, HOSP PERFORMED  URINALYSIS, ROUTINE W REFLEX MICROSCOPIC  LACTIC ACID, PLASMA  LACTIC ACID, PLASMA  I-STAT VENOUS BLOOD GAS, ED  I-STAT BETA HCG BLOOD, ED ([facility], [facility], [facility] ONLY)    CT CHEST ABDOMEN PELVIS WO CONTRAST  Final Result    CT HEAD WO CONTRAST (5MM)  Final Result    DG Chest Port 1 View  Final Result      Medications  lactated ringers infusion ( Intravenous New Bag/Given 04/27/22 2317)  metroNIDAZOLE (FLAGYL) IVPB 500 mg (500 mg Intravenous New Bag/Given 04/27/22 2318)  vancomycin (VANCOCIN) IVPB 1000 mg/200 mL premix (1,000 mg Intravenous New Bag/Given  04/27/22 2318)  vancomycin variable dose per unstable renal function (pharmacist dosing) (has no administration in time range)  ceFEPIme (MAXIPIME) 2 g in sodium chloride 0.9 % 100 mL IVPB (has no administration in time range)  sodium chloride 0.9 % bolus 1,000 mL (1,000 mLs Intravenous New Bag/Given 04/27/22 2237)  lactated ringers bolus 1,000 mL (1,000 mLs Intravenous New Bag/Given 04/27/22 2245)    And  lactated ringers bolus 1,000 mL (1,000 mLs Intravenous New Bag/Given 04/27/22 2315)  ceFEPIme (MAXIPIME) 2 g in sodium chloride 0.9 % 100 mL IVPB (0 g Intravenous Stopped 04/27/22 2339)  calcium gluconate 1 g/ 50 mL sodium chloride IVPB (0 mg Intravenous Stopped 04/27/22 2346)     Procedures  /  Critical Care .Critical Care  Performed by: Montine Circle, PA-C Authorized by: Montine Circle, PA-C   Critical care provider statement:    Critical care time (minutes):  51   Critical care was necessary to treat or prevent imminent or life-threatening deterioration of the following conditions:  Sepsis, renal failure and respiratory failure   Critical care was time spent personally by me on the following activities:  Development of treatment plan with patient or surrogate, discussions with consultants, evaluation of patient's response to treatment, examination of patient, ordering and review of laboratory studies, ordering and review of radiographic studies, ordering and performing treatments and interventions, pulse oximetry, re-evaluation of patient's condition and review of old charts   ED Course and Medical Decision Making  I have reviewed the triage vital signs, the nursing notes, and pertinent available records from the EMR.  Social Determinants Affecting Complexity of Care: Patient suffers from drug abuse/addiction. {rbsocialsolutions:27068}  ED Course:    Medical Decision Making Problems Addressed: Hyperkalemia: acute illness or injury Overdose of undetermined intent, initial  encounter: acute illness or injury Sepsis with acute renal failure without septic shock, due to unspecified organism, unspecified acute renal failure type [facility]): acute illness or injury that poses a threat to life or bodily functions  Amount and/or Complexity of Data Reviewed Labs: ordered.    Details: WBC 43, now meets sepsis criteria.  Code sepsis activated.  K is 5.9, will give calcium per Dr. Darl Householder, Cr 2.40 up from 0.68 Aug 2023, receiving fluids with sepsis bundle.  CK normal, doubt rhabdo. Radiology: ordered and independent interpretation performed.    Details: No focal opacity or effusion on CXR ECG/medicine tests: ordered and independent interpretation performed.    Details: Sinus tachycardia  Risk Prescription drug management. Decision regarding hospitalization.     Consultants: I discussed the case with Hospitalist, Dr. ***, who is appreciated for admitting.   Treatment and Plan: Patient's exam and diagnostic results are  concerning for sepsis, ARF, and suspected aspiration pneumonia.  Feel that patient will need admission to the hospital for further treatment and evaluation.  {rbattending:27073}  Final Clinical Impressions(s) / ED Diagnoses     ICD-10-CM   1. Sepsis with acute renal failure without septic shock, due to unspecified organism, unspecified acute renal failure type ([place])  A41.9    R65.20    N17.9     2. Overdose of undetermined intent, initial encounter  T50.904A     3. Hyperkalemia  E87.5       ED Discharge Orders     None         Discharge Instructions Discussed with and Provided to Patient:   Discharge Instructions   None

## 2022-04-27 NOTE — ED Triage Notes (Addendum)
BIB EMS for OD on prescription meds, unknown specifics. Per EMS @ 9am OD received '4mg'$  Narcan, refused to go to hospital. This call found in respiratory distress, received '2mg'$  Narcan. Notable posturing  EMS VS: 118/60 82RA

## 2022-04-28 DIAGNOSIS — N179 Acute kidney failure, unspecified: Secondary | ICD-10-CM | POA: Diagnosis present

## 2022-04-28 DIAGNOSIS — K7682 Hepatic encephalopathy: Secondary | ICD-10-CM | POA: Diagnosis present

## 2022-04-28 DIAGNOSIS — J439 Emphysema, unspecified: Secondary | ICD-10-CM | POA: Diagnosis present

## 2022-04-28 DIAGNOSIS — Z8614 Personal history of Methicillin resistant Staphylococcus aureus infection: Secondary | ICD-10-CM | POA: Diagnosis not present

## 2022-04-28 DIAGNOSIS — Z1152 Encounter for screening for COVID-19: Secondary | ICD-10-CM | POA: Diagnosis not present

## 2022-04-28 DIAGNOSIS — R4182 Altered mental status, unspecified: Secondary | ICD-10-CM | POA: Diagnosis not present

## 2022-04-28 DIAGNOSIS — A419 Sepsis, unspecified organism: Secondary | ICD-10-CM | POA: Diagnosis present

## 2022-04-28 DIAGNOSIS — T50904A Poisoning by unspecified drugs, medicaments and biological substances, undetermined, initial encounter: Secondary | ICD-10-CM

## 2022-04-28 DIAGNOSIS — R6521 Severe sepsis with septic shock: Secondary | ICD-10-CM

## 2022-04-28 DIAGNOSIS — R6520 Severe sepsis without septic shock: Secondary | ICD-10-CM

## 2022-04-28 DIAGNOSIS — G9341 Metabolic encephalopathy: Secondary | ICD-10-CM | POA: Diagnosis present

## 2022-04-28 DIAGNOSIS — R278 Other lack of coordination: Secondary | ICD-10-CM | POA: Diagnosis present

## 2022-04-28 DIAGNOSIS — E876 Hypokalemia: Secondary | ICD-10-CM | POA: Diagnosis not present

## 2022-04-28 DIAGNOSIS — J690 Pneumonitis due to inhalation of food and vomit: Secondary | ICD-10-CM | POA: Diagnosis present

## 2022-04-28 DIAGNOSIS — T50901A Poisoning by unspecified drugs, medicaments and biological substances, accidental (unintentional), initial encounter: Secondary | ICD-10-CM | POA: Diagnosis present

## 2022-04-28 DIAGNOSIS — R0603 Acute respiratory distress: Secondary | ICD-10-CM | POA: Diagnosis present

## 2022-04-28 DIAGNOSIS — J9601 Acute respiratory failure with hypoxia: Secondary | ICD-10-CM | POA: Diagnosis present

## 2022-04-28 DIAGNOSIS — T401X4A Poisoning by heroin, undetermined, initial encounter: Secondary | ICD-10-CM | POA: Diagnosis present

## 2022-04-28 DIAGNOSIS — N170 Acute kidney failure with tubular necrosis: Secondary | ICD-10-CM

## 2022-04-28 DIAGNOSIS — I10 Essential (primary) hypertension: Secondary | ICD-10-CM | POA: Diagnosis present

## 2022-04-28 DIAGNOSIS — Z87891 Personal history of nicotine dependence: Secondary | ICD-10-CM | POA: Diagnosis not present

## 2022-04-28 DIAGNOSIS — K7030 Alcoholic cirrhosis of liver without ascites: Secondary | ICD-10-CM | POA: Diagnosis present

## 2022-04-28 DIAGNOSIS — F10131 Alcohol abuse with withdrawal delirium: Secondary | ICD-10-CM | POA: Diagnosis not present

## 2022-04-28 DIAGNOSIS — F319 Bipolar disorder, unspecified: Secondary | ICD-10-CM | POA: Diagnosis present

## 2022-04-28 DIAGNOSIS — Z8249 Family history of ischemic heart disease and other diseases of the circulatory system: Secondary | ICD-10-CM | POA: Diagnosis not present

## 2022-04-28 DIAGNOSIS — F1110 Opioid abuse, uncomplicated: Secondary | ICD-10-CM | POA: Diagnosis present

## 2022-04-28 DIAGNOSIS — J189 Pneumonia, unspecified organism: Secondary | ICD-10-CM | POA: Diagnosis present

## 2022-04-28 DIAGNOSIS — Z833 Family history of diabetes mellitus: Secondary | ICD-10-CM | POA: Diagnosis not present

## 2022-04-28 DIAGNOSIS — E875 Hyperkalemia: Secondary | ICD-10-CM | POA: Diagnosis present

## 2022-04-28 LAB — BASIC METABOLIC PANEL
Anion gap: 12 (ref 5–15)
BUN: 11 mg/dL (ref 6–20)
CO2: 20 mmol/L — ABNORMAL LOW (ref 22–32)
Calcium: 8.6 mg/dL — ABNORMAL LOW (ref 8.9–10.3)
Chloride: 101 mmol/L (ref 98–111)
Creatinine, Ser: 1.82 mg/dL — ABNORMAL HIGH (ref 0.44–1.00)
GFR, Estimated: 32 mL/min — ABNORMAL LOW (ref >60)
Glucose, Bld: 124 mg/dL — ABNORMAL HIGH (ref 70–99)
Potassium: 5.3 mmol/L — ABNORMAL HIGH (ref 3.5–5.1)
Sodium: 133 mmol/L — ABNORMAL LOW (ref 135–145)

## 2022-04-28 LAB — URINALYSIS, MICROSCOPIC (REFLEX)
Bacteria, UA: NONE SEEN
RBC / HPF: NONE SEEN RBC/hpf (ref 0–5)

## 2022-04-28 LAB — URINALYSIS, ROUTINE W REFLEX MICROSCOPIC
Bilirubin Urine: NEGATIVE
Glucose, UA: NEGATIVE mg/dL
Hgb urine dipstick: NEGATIVE
Ketones, ur: NEGATIVE mg/dL
Leukocytes,Ua: NEGATIVE
Nitrite: NEGATIVE
Protein, ur: 30 mg/dL — AB
Specific Gravity, Urine: 1.02 (ref 1.005–1.030)
pH: 6 (ref 5.0–8.0)

## 2022-04-28 LAB — CBC
HCT: 42.5 % (ref 36.0–46.0)
Hemoglobin: 13.9 g/dL (ref 12.0–15.0)
MCH: 29.9 pg (ref 26.0–34.0)
MCHC: 32.7 g/dL (ref 30.0–36.0)
MCV: 91.4 fL (ref 80.0–100.0)
Platelets: 296 10*3/uL (ref 150–400)
RBC: 4.65 MIL/uL (ref 3.87–5.11)
RDW: 13.2 % (ref 11.5–15.5)
WBC: 32.5 10*3/uL — ABNORMAL HIGH (ref 4.0–10.5)
nRBC: 0 % (ref 0.0–0.2)

## 2022-04-28 LAB — HEPATIC FUNCTION PANEL
ALT: 18 U/L (ref 0–44)
AST: 25 U/L (ref 15–41)
Albumin: 4.1 g/dL (ref 3.5–5.0)
Alkaline Phosphatase: 89 U/L (ref 38–126)
Bilirubin, Direct: <0.1 mg/dL (ref 0.0–0.2)
Total Bilirubin: 0.4 mg/dL (ref 0.3–1.2)
Total Protein: 7.5 g/dL (ref 6.5–8.1)

## 2022-04-28 LAB — RESP PANEL BY RT-PCR (FLU A&B, COVID) ARPGX2
Influenza A by PCR: NEGATIVE
Influenza B by PCR: NEGATIVE
SARS Coronavirus 2 by RT PCR: NEGATIVE

## 2022-04-28 LAB — RAPID URINE DRUG SCREEN, HOSP PERFORMED
Amphetamines: NOT DETECTED
Barbiturates: NOT DETECTED
Benzodiazepines: NOT DETECTED
Cocaine: NOT DETECTED
Opiates: NOT DETECTED
Tetrahydrocannabinol: NOT DETECTED

## 2022-04-28 LAB — LACTIC ACID, PLASMA
Lactic Acid, Venous: 2.8 mmol/L (ref 0.5–1.9)
Lactic Acid, Venous: 1.3 mmol/L (ref 0.5–1.9)

## 2022-04-28 LAB — AMMONIA: Ammonia: 42 umol/L — ABNORMAL HIGH (ref 9–35)

## 2022-04-28 MED ORDER — ACETAMINOPHEN 325 MG PO TABS
650.0000 mg | ORAL_TABLET | Freq: Four times a day (QID) | ORAL | Status: DC | PRN
  Administered 2022-04-29 – 2022-04-30 (×2): 650 mg via ORAL
  Filled 2022-04-28 (×2): qty 2

## 2022-04-28 MED ORDER — PANTOPRAZOLE SODIUM 40 MG PO TBEC
40.0000 mg | DELAYED_RELEASE_TABLET | Freq: Every day | ORAL | Status: DC
  Administered 2022-04-28 – 2022-05-02 (×5): 40 mg via ORAL
  Filled 2022-04-28 (×5): qty 1

## 2022-04-28 MED ORDER — NALOXONE HCL 4 MG/10ML IJ SOLN
1.0000 mg/h | INTRAVENOUS | Status: DC
  Administered 2022-04-28: 1 mg/h via INTRAVENOUS
  Filled 2022-04-28: qty 10

## 2022-04-28 MED ORDER — LACTULOSE 10 GM/15ML PO SOLN
30.0000 g | Freq: Three times a day (TID) | ORAL | Status: DC
  Administered 2022-04-28 – 2022-05-01 (×12): 30 g via ORAL
  Filled 2022-04-28 (×13): qty 60

## 2022-04-28 MED ORDER — LACTULOSE ENEMA
300.0000 mL | Freq: Once | ORAL | Status: DC
  Filled 2022-04-28: qty 300

## 2022-04-28 MED ORDER — HYDROXYZINE HCL 25 MG PO TABS
50.0000 mg | ORAL_TABLET | Freq: Three times a day (TID) | ORAL | Status: DC
  Administered 2022-04-28 – 2022-05-02 (×13): 50 mg via ORAL
  Filled 2022-04-28 (×13): qty 2

## 2022-04-28 MED ORDER — OXYCODONE HCL 5 MG PO TABS
5.0000 mg | ORAL_TABLET | ORAL | Status: DC | PRN
  Administered 2022-05-02: 5 mg via ORAL
  Filled 2022-04-28: qty 1

## 2022-04-28 MED ORDER — METRONIDAZOLE 500 MG/100ML IV SOLN
500.0000 mg | Freq: Two times a day (BID) | INTRAVENOUS | Status: DC
  Administered 2022-04-28 – 2022-04-30 (×5): 500 mg via INTRAVENOUS
  Filled 2022-04-28 (×5): qty 100

## 2022-04-28 MED ORDER — ENOXAPARIN SODIUM 30 MG/0.3ML IJ SOSY
30.0000 mg | PREFILLED_SYRINGE | Freq: Every day | INTRAMUSCULAR | Status: DC
  Administered 2022-04-28: 30 mg via SUBCUTANEOUS
  Filled 2022-04-28 (×2): qty 0.3

## 2022-04-28 MED ORDER — FOLIC ACID 1 MG PO TABS
1.0000 mg | ORAL_TABLET | Freq: Every day | ORAL | Status: DC
  Administered 2022-04-28 – 2022-05-02 (×5): 1 mg via ORAL
  Filled 2022-04-28 (×5): qty 1

## 2022-04-28 MED ORDER — GABAPENTIN 300 MG PO CAPS
300.0000 mg | ORAL_CAPSULE | Freq: Two times a day (BID) | ORAL | Status: DC
  Administered 2022-04-28 – 2022-05-02 (×9): 300 mg via ORAL
  Filled 2022-04-28 (×9): qty 1

## 2022-04-28 MED ORDER — ACETAMINOPHEN 650 MG RE SUPP: 650.0000 mg | Freq: Four times a day (QID) | RECTAL | Status: DC | PRN

## 2022-04-28 MED ORDER — NALOXONE HCL 0.4 MG/ML IJ SOLN: 0.4000 mg | INTRAMUSCULAR | Status: DC | PRN

## 2022-04-28 MED ORDER — LACTULOSE 10 GM/15ML PO SOLN: 20.0000 g | Freq: Every day | ORAL | Status: DC | PRN

## 2022-04-28 NOTE — Plan of Care (Signed)

## 2022-04-28 NOTE — Sepsis Progress Note (Signed)
Per bedside RN, Blood cultures collected before abx.

## 2022-04-28 NOTE — ED Notes (Signed)
Patient noted to have continued apena episodes and desating to low 80s, MD provider made aware. New orders initiating

## 2022-04-28 NOTE — Progress Notes (Signed)
Patient admitted after midnight, please see H&P.  In addition to H&P will order ammonia level: continue lactulose for now.  Will also order PRN narcan as not all opioids show on our limited toxicology testing. Eulogio Bear DO

## 2022-04-28 NOTE — ED Notes (Signed)
Patient oxygen levels dropped to 82% with a good pleth with a [place] 4L, RT called. Advised to titrate up to 6L-tolerating well.

## 2022-04-28 NOTE — H&P (Addendum)
History and Physical  Patient Name: Amy Villa     JQZ:[id number]    DOB: 09-Apr-1966    DOA: 04/27/2022 PCP: Patient, No Pcp Per  Chief Complaint: AMS  HPI: Amy Villa is a 56 y.o. with history of CAD, hypertension, polysubstance abuse presented to the emergency department with concern for an overdose.  Apparently yesterday morning patient was found to be unresponsive.  EMS was called and was given Narcan.  Upon appropriate response patient refused to present to ED and EMS left.  Patient's family subsequently found her nonresponsive again later in the day.  EMS was called patient was given Narcan again and she was transferred to the emergency department for further evaluation.  On presentation she was hypoxic satting in the low 80s, tachycardic and normotensive.  Patient was given more Narcan and labs were obtained which were notable for WBC 43, hemoglobin 16, sodium 132, potassium 5.8 with hemolysis, creatinine 2.5, anion gap 19, alcohol level undetectable, CK 89, lactic acid 2.8, respiratory viral panel negative for infection urine drug screen negative for opiates.  Urinalysis showed no concern for infection.  Chest x-ray showed emphysema without focal consolidations.  CT head showed no acute intracranial abnormality.  CT chest abdomen pelvis demonstrated no acute findings.  Patient was admitted for further management.  She remained borderline hypoxic and was placed on Narcan drip. ON exam patient had notable asterixis and denied drug use. She was given  lactulose and narcan was Dc'd  ROS: Negative unless noted   Past Medical History:  Diagnosis Date   Alcoholic hepatitis    Aortic atherosclerosis ([place])    Arthritis    neck   Carpal tunnel syndrome on both sides 02/2012   Cigarette nicotine dependence    Contact lens/glasses fitting    wears contacts or glasses   Dental crowns present    x 2 upper front   Elevated LFTs    Fatty liver    Fluid retention    History of MRSA  infection    Hypertension    Insomnia    Migraines    Osteoarthritis    Parasomnia    Wears partial dentures    lower    Past Surgical History:  Procedure Laterality Date   ANKLE ARTHROSCOPY WITH RECONSTRUCTION  07/2017   BIOPSY  02/24/2018   Procedure: BIOPSY;  Surgeon: Jackquline Denmark, MD;  Location: [facility];  Service: Endoscopy;;   CARPAL TUNNEL RELEASE  03/17/2012   Procedure: CARPAL TUNNEL RELEASE;  Surgeon: Roseanne Kaufman, MD;  Location: [place];  Service: Orthopedics;  Laterality: Bilateral;  left limited open carpal tunnel release. right carpal tunnel injection with 1cc of celestone and 2cc of marcaine.25%   CARPAL TUNNEL RELEASE  06/30/2012   Procedure: CARPAL TUNNEL RELEASE;  Surgeon: Roseanne Kaufman, MD;  Location: [place];  Service: Orthopedics;  Laterality: Right;  RIGHT LIMITED OPEN CARPAL TUNNEL RELEASE   CARPOMETACARPAL ([facility]) FUSION OF THUMB Left 06/23/2013   Procedure: LEFT CARPOMETACARPEL ([place]) FUSION OF THUMB WITH AUTOGRAFT FROM RADIUS AND REPAIR AS NECESSARY;  Surgeon: Roseanne Kaufman, MD;  Location: [place];  Service: Orthopedics;  Laterality: Left;   COLONOSCOPY  2014   ESOPHAGOGASTRODUODENOSCOPY  05/2017   ESOPHAGOGASTRODUODENOSCOPY (EGD) WITH PROPOFOL N/A 02/24/2018   Procedure: ESOPHAGOGASTRODUODENOSCOPY (EGD) WITH PROPOFOL;  Surgeon: Jackquline Denmark, MD;  Location: [facility];  Service: Endoscopy;  Laterality: N/A;   HARDWARE REMOVAL Left 07/10/2013   Procedure: HARDWARE REMOVAL;  Surgeon: Roseanne Kaufman, MD;  Location:  [facility];  Service: Orthopedics;  Laterality: Left;   INCISION AND DRAINAGE OF WOUND Left 07/10/2013   Procedure: IRRIGATION AND DEBRIDEMENT WOUND left wrist  ;  Surgeon: Roseanne Kaufman, MD;  Location: [facility];  Service: Orthopedics;  Laterality: Left;   LAPAROSCOPIC VAGINAL HYSTERECTOMY  02/24/2010   LAPAROSCOPY  06/2010   for adhesions   LEEP     x 2   LUMBAR DISC SURGERY  03/23/2003   left L5-S1 discectomy with  microdissection   LUMBAR DISC SURGERY  05/23/2003   redo discectomy L5-S1 with microdissection    Social History: polysubstance abuse  Allergies  Allergen Reactions   Hydroxyamine Other (See Comments)    Not effective   Pregabalin Other (See Comments)    Angry, upset     Family history: family history includes Colon polyps in her sister; Diabetes in her mother and sister; Hypertension in her mother and sister; Lung cancer in her maternal grandmother.  Prior to Admission medications   Medication Sig Start Date End Date Taking? Authorizing Provider  amoxicillin-clavulanate (AUGMENTIN) 875-125 MG tablet Take 1 tablet by mouth 2 (two) times daily. 03/09/22   Danford, Suann Larry, MD  betamethasone dipropionate 0.05 % lotion Apply 1 Application topically 2 (two) times daily as needed for itching. 12/26/21   [provider]  budesonide-formoterol (SYMBICORT) 160-4.5 MCG/ACT inhaler Inhale 2 puffs into the lungs 2 (two) times daily as needed (shortness of breath).    [provider]  folic acid (FOLVITE) 1 MG tablet Take 1 tablet (1 mg total) by mouth daily. Patient not taking: Reported on 03/05/2022 01/13/22   Jonetta Osgood, MD  gabapentin (NEURONTIN) 800 MG tablet Take 800 mg by mouth 3 (three) times daily. 03/03/22   [provider]  hydrOXYzine (ATARAX) 50 MG tablet Take 50 mg by mouth 3 (three) times daily. 03/03/22   [provider]  lactulose (Myrtle) 10 GM/15ML solution TAKE 30ML BY MOUTH EVERY DAY Patient taking differently: Take 20 g by mouth daily as needed (constipation). 06/10/18   [name], MD  lamoTRIgine (LAMICTAL) 25 MG tablet Take 1 tablet (25 mg total) by mouth daily. 01/13/22   Ghimire, Henreitta Leber, MD  Multiple Vitamin (MULTIVITAMIN WITH MINERALS) TABS tablet Take 1 tablet by mouth daily. 01/13/22   Ghimire, Henreitta Leber, MD  Oxcarbazepine (TRILEPTAL) 300 MG tablet Take 300 mg by mouth 2 (two) times daily. 02/04/22   [provider]  pantoprazole (PROTONIX) 40 MG tablet TAKE 1 TABLET(40 MG) BY MOUTH DAILY BEFORE BREAKFAST Patient taking differently: Take 40 mg by mouth daily before breakfast. 08/15/18   [name], MD  polyvinyl alcohol (ARTIFICIAL TEARS) 1.4 % ophthalmic solution Place 1 drop into both eyes daily as needed for dry eyes.     [provider]  QUEtiapine (SEROQUEL) 400 MG tablet Take 400 mg by mouth at bedtime.    [provider]  QUEtiapine (SEROQUEL) 50 MG tablet Take 1 tablet (50 mg total) by mouth at bedtime as needed (insomnia). Patient not taking: Reported on 03/05/2022 01/13/22   Jonetta Osgood, MD  tiZANidine (ZANAFLEX) 4 MG tablet Take 4 mg by mouth 3 (three) times daily as needed for muscle spasms. 03/02/22   [provider]  VENTOLIN HFA 108 (90 Base) MCG/ACT inhaler INHALE 2 PUFFS INTO THE LUNGS EVERY 6 HOURS AS NEEDED FOR WHEEZING OR SHORTNESS OF BREATH Patient taking differently: Inhale 2 puffs into the lungs every 6 (six) hours as needed for wheezing or  shortness of breath. 05/19/18   [name], PA-C       Physical Exam: BP (!) 128/95   Pulse (!) 102   Temp 98.1 F (36.7 C) (Oral)   Resp 11   SpO2 94%  General appearance: Not in acute distress Eyes: Anicteric, conjunctiva pink, lids and lashes normal. PERRL.    ENT: No nasal deformity, discharge, epistaxis.  Lymph: No cervical or supraclavicular lymphadenopathy. Skin: Warm and dry.  No jaundice.  No suspicious rashes or lesions. Cardiac: RRR, nl S1-S2, no murmurs appreciated. No edema Respiratory: Normal respiratory rate and rhythm. Abdomen: Abdomen soft, nontender.  MSK: No deformities or effusions of the large joints of the upper or lower extremities bilaterally.  Neuro: Somnolent, AAO x3, arousable, asterixis, clonus      Labs on Admission:  I have personally reviewed following labs and imaging studies: CBC: Recent Labs  Lab 04/27/22 2220 04/27/22 2233  WBC 43.0*   --   NEUTROABS 39.1*  --   HGB 16.0* 18.4*  HCT 49.5* 54.0*  MCV 89.7  --   PLT 324  --    Basic Metabolic Panel: Recent Labs  Lab 04/27/22 2220 04/27/22 2233  NA 132* 134*  K 5.8* 5.9*  CL 91* 93*  CO2 22  --   GLUCOSE 128* 125*  BUN 13 19  CREATININE 2.49* 2.40*  CALCIUM 9.5  --    GFR: CrCl cannot be calculated (Unknown ideal weight.).  Liver Function Tests: Recent Labs  Lab 04/27/22 2220  AST 38  ALT 22  ALKPHOS 101  BILITOT 0.7  PROT 8.9*  ALBUMIN 5.0   No results for input(s): "LIPASE", "AMYLASE" in the last 168 hours. No results for input(s): "AMMONIA" in the last 168 hours. Coagulation Profile: No results for input(s): "INR", "PROTIME" in the last 168 hours. Cardiac Enzymes: Recent Labs  Lab 04/27/22 2220  CKTOTAL 89   BNP (last 3 results) No results for input(s): "PROBNP" in the last 8760 hours.  Radiological Exams on Admission:  CT CHEST ABDOMEN PELVIS WO CONTRAST  Result Date: 04/27/2022 CLINICAL DATA:  Overdose, respiratory distress EXAM: CT CHEST, ABDOMEN AND PELVIS WITHOUT CONTRAST TECHNIQUE: Multidetector CT imaging of the chest, abdomen and pelvis was performed following the standard protocol without IV contrast. RADIATION DOSE REDUCTION: This exam was performed according to the departmental dose-optimization program which includes automated exposure control, adjustment of the mA and/or kV according to patient size and/or use of iterative reconstruction technique. COMPARISON:  CT abdomen/pelvis dated 03/09/2022. CT chest dated 03/08/2022. FINDINGS: CT CHEST FINDINGS Cardiovascular: The heart is normal in size. Small pericardial effusion. No evidence of thoracic aortic aneurysm. Mediastinum/Nodes: No suspicious mediastinal lymphadenopathy. Visualized thyroid is unremarkable. Lungs/Pleura: Moderate centrilobular and paraseptal emphysematous changes. Dependent atelectasis in the posterior upper and lower lobes. No suspicious pulmonary nodules. No focal  consolidation. No pleural effusion or pneumothorax. Musculoskeletal: Old bilateral posterior 9th through 11th rib fractures, including a nonunited left posterior 11th rib fracture (series 3/image 52). Thoracic spine is within normal limits. CT ABDOMEN PELVIS FINDINGS Hepatobiliary: Nodular hepatic contour, reflecting cirrhosis. Gallbladder is unremarkable. No intrahepatic or extrahepatic ductal dilatation. Pancreas: Within normal limits. Spleen: Within normal limits. Adrenals/Urinary Tract: Adrenal glands are within normal limits. Kidneys are within normal limits. No renal, ureteral, or bladder calculi. No hydronephrosis. Bladder is within normal limits. Stomach/Bowel: Stomach is within normal limits. No evidence of bowel obstruction. Normal appendix (series 3/image 96). No colonic wall thickening or inflammatory changes. Vascular/Lymphatic: No evidence of abdominal aortic  aneurysm. Atherosclerotic calcifications of the abdominal aorta and branch vessels. No suspicious abdominopelvic lymphadenopathy. Reproductive: Status post hysterectomy. Bilateral ovaries are within normal limits. Other: No abdominopelvic ascites. Musculoskeletal: Mild degenerative changes of the lumbar spine. IMPRESSION: No acute findings in the abdomen/pelvis. Small pericardial effusion. Cirrhosis.  No abdominopelvic ascites. Aortic Atherosclerosis (ICD10-I70.0) and Emphysema (ICD10-J43.9). Electronically Signed   By: Julian Hy M.D.   On: 04/27/2022 23:14   CT HEAD WO CONTRAST (5MM)  Result Date: 04/27/2022 CLINICAL DATA:  Altered mental status EXAM: CT HEAD WITHOUT CONTRAST TECHNIQUE: Contiguous axial images were obtained from the base of the skull through the vertex without intravenous contrast. RADIATION DOSE REDUCTION: This exam was performed according to the departmental dose-optimization program which includes automated exposure control, adjustment of the mA and/or kV according to patient size and/or use of iterative  reconstruction technique. COMPARISON:  MRI brain dated 03/05/2022 FINDINGS: Brain: No evidence of acute infarction, hemorrhage, hydrocephalus, extra-axial collection or mass lesion/mass effect. Vascular: No hyperdense vessel or unexpected calcification. Skull: Normal. Negative for fracture or focal lesion. Sinuses/Orbits: The visualized paranasal sinuses are essentially clear. The mastoid air cells are unopacified. Other: None. IMPRESSION: Normal head CT. Electronically Signed   By: Julian Hy M.D.   On: 04/27/2022 22:59   DG Chest Port 1 View  Result Date: 04/27/2022 CLINICAL DATA:  Hypoxemia. EXAM: PORTABLE CHEST 1 VIEW COMPARISON:  CT chest dated April 17, 2022 FINDINGS: The heart size and mediastinal contours are within normal limits. Emphysematous changes of lung apices. No focal consolidation or pleural effusion. Low lung volumes with elevation of bilateral hemidiaphragms. The visualized skeletal structures are unremarkable. IMPRESSION: 1. Emphysema. 2. No focal consolidation or pleural effusion. Electronically Signed   By: Keane Police D.O.   On: 04/27/2022 22:33     Assessment/Plan  Ms. Orihuela is a 56 year old lady with a history of polysubstance abuse who presented with acute encephalopathy and hypoxia most likely due to hepatic encephalopathy and possible aspiration pneumonitis/pneumonia.  Patient responded to Narcan however has profound leukocytosis, lactic acidosis and intermittent hypoxia.  She was asterixis on exam  Acute toxic/metabolic encephalopathy most likely due to hepatic encephalopathy and/or aspiration pneumonia, POA, active Lactic acidosis, POA, active Acute kidney injury, p.oa, active Hyperkalemia, POA, active Severe sepsis most likely due to aspiration pneumonia, POA, active - No opacities on CT however remains somnolent and responded to Narcan -UDS negative - Leukocytosis of 43 -No EKG changes - Lactic acid 2.8  Plan: DC narcan Continue vancomycin,  cefepime, Flagyl Continue supplemental oxygen Placed on aggressive lactulose regimen  Alcoholic cirrhosis, POA, active - Hepatic encephalopathy on presentation - Patient has nodular liver consistent with cirrhosis  Plan: Aggressive lactulose regimen with oral and enemas Trend LFTs  GERD-continue PPI  History of depression/behavioral complaints-unable to verify med rec.  Patient was previously on Lamictal and Trileptal but unable to confirm with patient dosages or usages of medications.  She is also on Seroquel.  DVT prophylaxis: lovenox  Code Status: full  Disposition Plan: Anticipate dc 2d Consults called: na Admission status: obs   At the point of initial evaluation, it is my clinical opinion that admission for OBSERVATION/is reasonable and necessary because the patient's presenting complaints in the context of their chronic conditions represent sufficient risk of deterioration or significant morbidity to constitute reasonable grounds for close observation in the hospital setting, but that the patient may be medically stable for discharge from the hospital within 24 to 48 hours.    Medical decision making: Patient  seen at 2:30 AM on 04/28/2022. What exists of the patient's chart was reviewed in depth and summarized above.    Emilee Hero [facility] Please page though [place] or Epic secure chat:  For password, contact charge nurse

## 2022-04-29 ENCOUNTER — Encounter (HOSPITAL_COMMUNITY): Payer: Self-pay | Admitting: Internal Medicine

## 2022-04-29 DIAGNOSIS — K7682 Hepatic encephalopathy: Secondary | ICD-10-CM

## 2022-04-29 DIAGNOSIS — G9341 Metabolic encephalopathy: Secondary | ICD-10-CM | POA: Diagnosis not present

## 2022-04-29 DIAGNOSIS — R4182 Altered mental status, unspecified: Secondary | ICD-10-CM

## 2022-04-29 DIAGNOSIS — T50904A Poisoning by unspecified drugs, medicaments and biological substances, undetermined, initial encounter: Secondary | ICD-10-CM | POA: Diagnosis not present

## 2022-04-29 LAB — CBC
HCT: 39.6 % (ref 36.0–46.0)
Hemoglobin: 12.6 g/dL (ref 12.0–15.0)
MCH: 29.2 pg (ref 26.0–34.0)
MCHC: 31.8 g/dL (ref 30.0–36.0)
MCV: 91.7 fL (ref 80.0–100.0)
Platelets: 198 10*3/uL (ref 150–400)
RBC: 4.32 MIL/uL (ref 3.87–5.11)
RDW: 13 % (ref 11.5–15.5)
WBC: 12.9 10*3/uL — ABNORMAL HIGH (ref 4.0–10.5)
nRBC: 0 % (ref 0.0–0.2)

## 2022-04-29 LAB — BASIC METABOLIC PANEL
Anion gap: 9 (ref 5–15)
BUN: 6 mg/dL (ref 6–20)
CO2: 22 mmol/L (ref 22–32)
Calcium: 8.9 mg/dL (ref 8.9–10.3)
Chloride: 106 mmol/L (ref 98–111)
Creatinine, Ser: 1.09 mg/dL — ABNORMAL HIGH (ref 0.44–1.00)
GFR, Estimated: 60 mL/min — ABNORMAL LOW (ref >60)
Glucose, Bld: 147 mg/dL — ABNORMAL HIGH (ref 70–99)
Potassium: 3.8 mmol/L (ref 3.5–5.1)
Sodium: 137 mmol/L (ref 135–145)

## 2022-04-29 LAB — VANCOMYCIN, RANDOM: Vancomycin Rm: 7 ug/mL

## 2022-04-29 LAB — AMMONIA: Ammonia: 44 umol/L — ABNORMAL HIGH (ref 9–35)

## 2022-04-29 MED ORDER — HALOPERIDOL LACTATE 5 MG/ML IJ SOLN
INTRAMUSCULAR | Status: AC
  Filled 2022-04-29: qty 1

## 2022-04-29 MED ORDER — THIAMINE MONONITRATE 100 MG PO TABS
100.0000 mg | ORAL_TABLET | Freq: Every day | ORAL | Status: DC
  Administered 2022-04-30 – 2022-05-02 (×3): 100 mg via ORAL
  Filled 2022-04-29 (×3): qty 1

## 2022-04-29 MED ORDER — LOPERAMIDE HCL 2 MG PO CAPS: 2.0000 mg | ORAL_CAPSULE | ORAL | Status: DC | PRN

## 2022-04-29 MED ORDER — SODIUM CHLORIDE 0.9 % IV SOLN
2.0000 g | Freq: Two times a day (BID) | INTRAVENOUS | Status: DC
  Administered 2022-04-29 – 2022-04-30 (×3): 2 g via INTRAVENOUS
  Filled 2022-04-29 (×3): qty 12.5

## 2022-04-29 MED ORDER — HALOPERIDOL LACTATE 5 MG/ML IJ SOLN
5.0000 mg | Freq: Once | INTRAMUSCULAR | Status: AC
  Administered 2022-04-29: 5 mg via INTRAMUSCULAR

## 2022-04-29 MED ORDER — ONDANSETRON 4 MG PO TBDP: 4.0000 mg | ORAL_TABLET | Freq: Four times a day (QID) | ORAL | Status: AC | PRN

## 2022-04-29 MED ORDER — ADULT MULTIVITAMIN W/MINERALS CH
1.0000 | ORAL_TABLET | Freq: Every day | ORAL | Status: DC
  Administered 2022-04-29 – 2022-05-02 (×4): 1 via ORAL
  Filled 2022-04-29 (×4): qty 1

## 2022-04-29 MED ORDER — ENOXAPARIN SODIUM 40 MG/0.4ML IJ SOSY
40.0000 mg | PREFILLED_SYRINGE | Freq: Every day | INTRAMUSCULAR | Status: DC
  Administered 2022-04-29 – 2022-05-02 (×4): 40 mg via SUBCUTANEOUS
  Filled 2022-04-29 (×3): qty 0.4

## 2022-04-29 MED ORDER — HYDROXYZINE HCL 25 MG PO TABS
25.0000 mg | ORAL_TABLET | Freq: Four times a day (QID) | ORAL | Status: AC | PRN
  Administered 2022-05-01 – 2022-05-02 (×3): 25 mg via ORAL
  Filled 2022-04-29 (×5): qty 1

## 2022-04-29 MED ORDER — VANCOMYCIN HCL IN DEXTROSE 1-5 GM/200ML-% IV SOLN
1000.0000 mg | INTRAVENOUS | Status: DC
  Administered 2022-04-29 – 2022-04-30 (×2): 1000 mg via INTRAVENOUS
  Filled 2022-04-29 (×2): qty 200

## 2022-04-29 MED ORDER — THIAMINE HCL 100 MG/ML IJ SOLN
100.0000 mg | Freq: Once | INTRAMUSCULAR | Status: AC
  Administered 2022-04-29: 100 mg via INTRAMUSCULAR
  Filled 2022-04-29: qty 2

## 2022-04-29 MED ORDER — LORAZEPAM 1 MG PO TABS
1.0000 mg | ORAL_TABLET | Freq: Four times a day (QID) | ORAL | Status: AC | PRN
  Administered 2022-04-29 – 2022-05-01 (×2): 1 mg via ORAL
  Filled 2022-04-29 (×2): qty 1

## 2022-04-29 NOTE — Progress Notes (Signed)
PROGRESS NOTE  Amy Villa:[id number] DOB: 12-05-1965 DOA: 04/27/2022 PCP: Heywood Bene, PA-C  HPI/Recap of past 24 hours: Amy Villa is a 56 y.o. with history of CAD, hypertension, polysubstance abuse presented with concern for an overdose after found to be unresponsive PTA. EMS was called, patient was given Narcan. On presentation she was hypoxic satting in the low 80s, tachycardic and normotensive. Urine drug screen negative for opiates. Urinalysis showed no concern for infection.  Chest x-ray showed emphysema without focal consolidations.  CT head showed no acute intracranial abnormality.  CT chest abdomen pelvis demonstrated no acute findings. Patient was admitted for further management.  She remained borderline hypoxic and was placed on Narcan drip. On exam patient had notable asterixis and denied drug use.     Today, patient intermittently remains confused, but more oriented than admission.  Continues to deny any kind of drug use, admits to drinking about 2 to 3 cans of beer daily and wants to quit alcohol.  Admits to tobacco use, almost a pack a day.  Currently denies any chest pain, abdominal pain, still SOB.      Assessment/Plan: Principal Problem:   Sepsis ([place]) Active Problems:   AMS (altered mental status)   Drug overdose   Acute toxic/metabolic encephalopathy most likely due to hepatic encephalopathy and/or aspiration pneumonia, POA, active Encephalopathy improving, still with episodes of intermittent confusion UDS negative Management as noted below  Alcoholic cirrhosis Noted possible hepatic encephalopathy Elevated ammonia, will trend INR pending LFTs WNL CT abdomen showed nodular liver consistent with cirrhosis, no significant ascites Continue lactulose Daily CMP Advised to quit alcohol, patient willing  Possible aspiration pneumonia/severe sepsis POA Acute hypoxic respiratory failure Possible undiagnosed COPD Currently afebrile,  with leukocytosis Lactic acidosis, currently resolved Currently requiring supplemental O2, plan to wean off CT chest unremarkable, except for notable emphysema Continue vancomycin, Flagyl, cefepime for another day, plan to de-escalate from tomorrow Continue supplemental oxygen Monitor closely  Acute kidney injury Improving Daily CMP   CAD Hypertension Currently chest pain-free Not on any BP meds  GERD Continue PPI   History of depression Unable to verify med rec Patient was on ??Lamictal, Trileptal,  Seroquel.       Estimated body mass index is 23.55 kg/m as calculated from the following:   Height as of 03/05/22: '5\' 3"'$  (1.6 m).   Weight as of this encounter: 60.3 kg.     Code Status: Full  Family Communication: None at bedside  Disposition Plan: Status is: Inpatient Remains inpatient appropriate because: Level of care      Consultants: None  Procedures: None  Antimicrobials: Vancomycin, cefepime, metronidazole  DVT prophylaxis: Lovenox   Objective: Vitals:   04/28/22 2326 04/29/22 0336 04/29/22 0800 04/29/22 1200  BP: (!) 162/91 (!) 162/91 (!) 159/89 (!) 169/95  Pulse: 100 95 95 88  Resp:  '20 19 16  '$ Temp: 98.9 F (37.2 C) 98.9 F (37.2 C) 98.7 F (37.1 C) 98.7 F (37.1 C)  TempSrc: Oral Oral Oral Oral  SpO2: 99% 98%    Weight:        Intake/Output Summary (Last 24 hours) at 04/29/2022 1424 Last data filed at 04/29/2022 0348 Gross per 24 hour  Intake 437 ml  Output 250 ml  Net 187 ml   Filed Weights   04/28/22 0800  Weight: 60.3 kg    Exam: General: NAD, chronically ill-appearing Cardiovascular: S1, S2 present Respiratory: Diminished breath sounds bilaterally Abdomen: Soft, nontender, nondistended, bowel sounds present Musculoskeletal:  No bilateral pedal edema noted Skin: Normal Psychiatry: Normal mood     Data Reviewed: CBC: Recent Labs  Lab 04/27/22 2220 04/27/22 2233 04/28/22 0312 04/29/22 0029  WBC 43.0*  --   32.5* 12.9*  NEUTROABS 39.1*  --   --   --   HGB 16.0* 18.4* 13.9 12.6  HCT 49.5* 54.0* 42.5 39.6  MCV 89.7  --  91.4 91.7  PLT 324  --  296 284   Basic Metabolic Panel: Recent Labs  Lab 04/27/22 2220 04/27/22 2233 04/28/22 0312 04/29/22 0029  NA 132* 134* 133* 137  K 5.8* 5.9* 5.3* 3.8  CL 91* 93* 101 106  CO2 22  --  20* 22  GLUCOSE 128* 125* 124* 147*  BUN '13 19 11 6  '$ CREATININE 2.49* 2.40* 1.82* 1.09*  CALCIUM 9.5  --  8.6* 8.9   GFR: Estimated Creatinine Clearance: 48.2 mL/min (A) (by C-G formula based on SCr of 1.09 mg/dL (H)). Liver Function Tests: Recent Labs  Lab 04/27/22 2220 04/28/22 0312  AST 38 25  ALT 22 18  ALKPHOS 101 89  BILITOT 0.7 0.4  PROT 8.9* 7.5  ALBUMIN 5.0 4.1   No results for input(s): "LIPASE", "AMYLASE" in the last 168 hours. Recent Labs  Lab 04/28/22 0843 04/29/22 0954  AMMONIA 42* 44*   Coagulation Profile: No results for input(s): "INR", "PROTIME" in the last 168 hours. Cardiac Enzymes: Recent Labs  Lab 04/27/22 2220  CKTOTAL 89   BNP (last 3 results) No results for input(s): "PROBNP" in the last 8760 hours. HbA1C: No results for input(s): "HGBA1C" in the last 72 hours. CBG: No results for input(s): "GLUCAP" in the last 168 hours. Lipid Profile: No results for input(s): "CHOL", "HDL", "LDLCALC", "TRIG", "CHOLHDL", "LDLDIRECT" in the last 72 hours. Thyroid Function Tests: No results for input(s): "TSH", "T4TOTAL", "FREET4", "T3FREE", "THYROIDAB" in the last 72 hours. Anemia Panel: No results for input(s): "VITAMINB12", "FOLATE", "FERRITIN", "TIBC", "IRON", "RETICCTPCT" in the last 72 hours. Urine analysis:    Component Value Date/Time   COLORURINE YELLOW 04/27/2022 2330   APPEARANCEUR CLEAR 04/27/2022 2330   LABSPEC 1.020 04/27/2022 2330   PHURINE 6.0 04/27/2022 2330   GLUCOSEU NEGATIVE 04/27/2022 2330   GLUCOSEU NEGATIVE 07/22/2016 1204   HGBUR NEGATIVE 04/27/2022 2330   BILIRUBINUR NEGATIVE 04/27/2022 2330    KETONESUR NEGATIVE 04/27/2022 2330   PROTEINUR 30 (A) 04/27/2022 2330   UROBILINOGEN 0.2 07/22/2016 1204   NITRITE NEGATIVE 04/27/2022 2330   LEUKOCYTESUR NEGATIVE 04/27/2022 2330   Sepsis Labs: '@LABRCNTIP'$ (procalcitonin:4,lacticidven:4)  ) Recent Results (from the past 240 hour(s))  Culture, blood (single)     Status: None (Preliminary result)   Collection Time: 04/27/22 11:15 PM   Specimen: BLOOD  Result Value Ref Range Status   Specimen Description BLOOD SITE NOT SPECIFIED  Final   Special Requests   Final    BOTTLES DRAWN AEROBIC AND ANAEROBIC Blood Culture adequate volume   Culture   Final    NO GROWTH < 24 HOURS Performed at [facility], [place] [address] [place], [place] [address]    Report Status PENDING  Incomplete  Resp Panel by RT-PCR (Flu A&B, Covid)     Status: None   Collection Time: 04/27/22 11:15 PM   Specimen: Nasal Swab  Result Value Ref Range Status   SARS Coronavirus 2 by RT PCR NEGATIVE NEGATIVE Final    Comment: (NOTE) SARS-CoV-2 target nucleic acids are NOT DETECTED.  The SARS-CoV-2 RNA is generally detectable in upper  respiratory specimens during the acute phase of infection. The lowest concentration of SARS-CoV-2 viral copies this assay can detect is 138 copies/mL. A negative result does not preclude SARS-Cov-2 infection and should not be used as the sole basis for treatment or other patient management decisions. A negative result may occur with  improper specimen collection/handling, submission of specimen other than nasopharyngeal swab, presence of viral mutation(s) within the areas targeted by this assay, and inadequate number of viral copies(<138 copies/mL). A negative result must be combined with clinical observations, patient history, and epidemiological information. The expected result is Negative.  Fact Sheet for Patients:  EntrepreneurPulse.com.au  Fact Sheet for Healthcare Providers:   IncredibleEmployment.be  This test is no t yet approved or cleared by the [place] FDA and  has been authorized for detection and/or diagnosis of SARS-CoV-2 by FDA under an Emergency Use Authorization (EUA). This EUA will remain  in effect (meaning this test can be used) for the duration of the COVID-19 declaration under Section 564(b)(1) of the Act, 21 U.S.C.section 360bbb-3(b)(1), unless the authorization is terminated  or revoked sooner.       Influenza A by PCR NEGATIVE NEGATIVE Final   Influenza B by PCR NEGATIVE NEGATIVE Final    Comment: (NOTE) The Xpert Xpress SARS-CoV-2/FLU/RSV plus assay is intended as an aid in the diagnosis of influenza from Nasopharyngeal swab specimens and should not be used as a sole basis for treatment. Nasal washings and aspirates are unacceptable for Xpert Xpress SARS-CoV-2/FLU/RSV testing.  Fact Sheet for Patients: EntrepreneurPulse.com.au  Fact Sheet for Healthcare Providers: IncredibleEmployment.be  This test is not yet approved or cleared by the [place] FDA and has been authorized for detection and/or diagnosis of SARS-CoV-2 by FDA under an Emergency Use Authorization (EUA). This EUA will remain in effect (meaning this test can be used) for the duration of the COVID-19 declaration under Section 564(b)(1) of the Act, 21 U.S.C. section 360bbb-3(b)(1), unless the authorization is terminated or revoked.  Performed at [facility], [place] [address], [place], [place] [address]       Studies: No results found.  Scheduled Meds:  enoxaparin (LOVENOX) injection  40 mg Subcutaneous Daily   folic acid  1 mg Oral Daily   gabapentin  300 mg Oral BID   hydrOXYzine  50 mg Oral TID   lactulose  30 g Oral TID   pantoprazole  40 mg Oral QAC breakfast    Continuous Infusions:  ceFEPime (MAXIPIME) IV 2 g (04/29/22 1244)   metronidazole 500 mg (04/29/22 0835)   vancomycin  1,000 mg (04/29/22 1107)     LOS: 1 day     Alma Friendly, MD [facility]  If 7PM-7AM, please contact night-coverage [email or web address] 04/29/2022, 2:24 PM

## 2022-04-29 NOTE — Progress Notes (Signed)
Patient reassessed after IM medication given. Calmly resting in bed at this time. Still refusing telemetry and oxygen. MD aware.

## 2022-04-29 NOTE — TOC Initial Note (Signed)
Transition of Care [facility]) - Initial/Assessment Note    Patient Details  Name: Amy Villa MRN: [id number] Date of Birth: [birth year]  Transition of [facility]) CM/SW Contact:    Cyndi Bender, RN Phone Number: 04/29/2022, 11:57 AM  Clinical Narrative:                  Spoke to patient regarding transition needs. Added PCP. Patient states she can afford her medications and has transportation to apts.   No TOC needs at this time.  Expected Discharge Plan: Home/Self Care Barriers to Discharge: Continued Medical Work up   Patient Goals and CMS Choice Patient states their goals for this hospitalization and ongoing recovery are:: return home      Expected Discharge Plan and Services Expected Discharge Plan: Home/Self Care       Living arrangements for the past 2 months: Single Family Home                                      Prior Living Arrangements/Services Living arrangements for the past 2 months: Single Family Home   Patient language and need for interpreter reviewed:: Yes        Need for Family Participation in Patient Care: Yes (Comment) Care giver support system in place?: Yes (comment)   Criminal Activity/Legal Involvement Pertinent to Current Situation/Hospitalization: No - Comment as needed  Activities of Daily Living Home Assistive Devices/Equipment: None ADL Screening (condition at time of admission) Patient's cognitive ability adequate to safely complete daily activities?: No Is the patient deaf or have difficulty hearing?: No Does the patient have difficulty seeing, even when wearing glasses/contacts?: No Does the patient have difficulty concentrating, remembering, or making decisions?: Yes Patient able to express need for assistance with ADLs?: Yes Does the patient have difficulty dressing or bathing?: Yes Independently performs ADLs?: Yes (appropriate for developmental age) Does the patient have difficulty walking or climbing  stairs?: Yes Weakness of Legs: Both Weakness of Arms/Hands: Left  Permission Sought/Granted                  Emotional Assessment   Attitude/Demeanor/Rapport: Engaged Affect (typically observed): Accepting        Admission diagnosis:  Hyperkalemia [E87.5] Drug overdose [T50.901A] Sepsis ([place]) [A41.9] AMS (altered mental status) [R41.82] Sepsis with acute renal failure without septic shock, due to unspecified organism, unspecified acute renal failure type ([place]) [A41.9, R65.20, N17.9] Overdose of undetermined intent, initial encounter [T50.904A] Patient Active Problem List   Diagnosis Date Noted   Sepsis ([place]) 04/28/2022   Drug overdose 04/28/2022   Compression neuropathy 03/09/2022   Rhabdomyolysis 74/94/4967   Acute metabolic encephalopathy 59/16/3846   Acute respiratory failure with hypoxia and hypercapnia ([place]) 03/05/2022   Alcohol abuse with alcohol-induced mood disorder ([place]) 01/11/2022   Respiratory arrest ([place]) 01/09/2022   Cardiac arrest ([place]) 11/10/2019   Coagulopathy ([place]) 65/99/3570   Metabolic encephalopathy 17/79/3903   Acute respiratory failure with hypoxemia ([place]) 11/10/2019   Aspiration pneumonia ([place]) 11/10/2019   AKI (acute kidney injury) ([place]) 00/92/3300   Metabolic acidosis 76/22/6333   Hyperkalemia 11/10/2019   Hypothermia 11/10/2019   AMS (altered mental status) 09/16/2019   GI bleed 02/23/2018   Substance abuse ([place]) 03/13/2017   Hypokalemia 03/13/2017   Aortic atherosclerosis ([place]) 12/01/2016   Hepatic steatosis 12/01/2016   Insomnia 12/01/2016   Anxiety and depression 12/01/2016   Liver cirrhosis ([place]) 11/16/2016  Nicotine dependence 07/11/2013   DJD (degenerative joint disease) 07/11/2013   DDD (degenerative disc disease) 07/11/2013   Normocytic anemia 07/11/2013   Narcotic abuse ([place]) 07/11/2013   [place] arthritis, thumb, degenerative 06/23/2013   Alcohol abuse 09/02/2010   Benign essential hypertension 06/09/2005   PCP:   Heywood Bene, PA-C Pharmacy:   [place], [place] - [address] [place] [address] [place] [address] [place]  [address] Phone: [phone] Fax: [phone]  [facility] [address] [place] [address] Phone: [phone] Fax: [phone]     Social Determinants of Health (SDOH) Interventions    Readmission Risk Interventions    11/14/2019   11:36 AM  Readmission Risk Prevention Plan  Transportation Screening Complete  PCP or Specialist Appt within 3-5 Days Complete  HRI or [place] Complete  Social Work Consult for [place] Planning/Counseling Complete  Palliative Care Screening Not Applicable  Medication Review Press photographer) Complete

## 2022-04-29 NOTE — Progress Notes (Addendum)
Patient is refusing to have oxygen and telemetry placed on at this time.She is confused and very agitated at this time.She has removed all iv's as well. Hallucinating stating that there are vans parked outside of her door and do not think this is a hospital. MD paged and notified. Charge nurse went in to try and redirect patient, patient standing in the hall refusing to return to room until seeing doctor. I informed patient that MD has been paged and was awaiting response. Patient takes off walking down the hall and off the unit. Patient then runs into another patients room on [address] screaming and asking for help. Security arrived and brought patient to room at this time. MD paged again due to this event. MD at bedside at this time. 5 mg of IV haldol ordered IM. Will continue to monitor.

## 2022-04-29 NOTE — Plan of Care (Signed)
  Problem: Education: Goal: Knowledge of General Education information will improve Description: Including pain rating scale, medication(s)/side effects and non-pharmacologic comfort measures Outcome: Progressing   Problem: Clinical Measurements: Goal: Ability to maintain clinical measurements within normal limits will improve Outcome: Progressing   Problem: Pain Managment: Goal: General experience of comfort will improve Outcome: Progressing   Problem: Safety: Goal: Ability to remain free from injury will improve Outcome: Progressing   Problem: Safety: Goal: Ability to remain free from injury will improve Outcome: Progressing   Problem: Skin Integrity: Goal: Risk for impaired skin integrity will decrease Outcome: Progressing

## 2022-04-29 NOTE — Progress Notes (Signed)
Pharmacy Antibiotic Note  Amy Villa is a 56 y.o. female admitted on 04/27/2022 presenting with respiratory distress.  Pharmacy was consulted 10/9 for vancomycin and cefepime dosing for sepsis.  Severe sepsis most likely due to aspiration pneumonia.    AKI> Scr has improved, down to 1.09.  Vancomycin randon level = 7 mcg/ml.  Will adjust cefepime and vancomycin dosing for improved renal function.   Plan: Give Vancomycin 1000 mg IV Q 24 hrs. Goal AUC 400-550. Expected AUC: 518, SCr used: 1.09 Increase Cefepime to 2g IV q12h Monitor renal function, Cx and clinical progression to narrow Vancomycin levels as needed  Weight: 60.3 kg (132 lb 15 oz)  Temp (24hrs), Avg:98.6 F (37 C), Min:98 F (36.7 C), Max:98.9 F (37.2 C)  Recent Labs  Lab 04/27/22 2220 04/27/22 2233 04/27/22 2310 04/28/22 0118 04/28/22 0312 04/29/22 0029  WBC 43.0*  --   --   --  32.5* 12.9*  CREATININE 2.49* 2.40*  --   --  1.82* 1.09*  LATICACIDVEN  --   --  2.8* 1.3  --   --   VANCORANDOM  --   --   --   --   --  7     Estimated Creatinine Clearance: 48.2 mL/min (A) (by C-G formula based on SCr of 1.09 mg/dL (H)).    Allergies  Allergen Reactions   Hydroxyamine Other (See Comments)    Not effective   Pregabalin Other (See Comments)    Angry, upset     Nicole Cella, [place] Clinical Pharmacist [phone] 04/29/2022 9:19 AM Please check AMION for all [place] phone numbers After 10:00 PM, call [place] [phone]

## 2022-04-30 ENCOUNTER — Inpatient Hospital Stay (HOSPITAL_COMMUNITY): Payer: BLUE CROSS/BLUE SHIELD

## 2022-04-30 DIAGNOSIS — K7682 Hepatic encephalopathy: Secondary | ICD-10-CM | POA: Diagnosis not present

## 2022-04-30 DIAGNOSIS — G9341 Metabolic encephalopathy: Secondary | ICD-10-CM | POA: Diagnosis not present

## 2022-04-30 DIAGNOSIS — R4182 Altered mental status, unspecified: Secondary | ICD-10-CM | POA: Diagnosis not present

## 2022-04-30 DIAGNOSIS — T50904A Poisoning by unspecified drugs, medicaments and biological substances, undetermined, initial encounter: Secondary | ICD-10-CM | POA: Diagnosis not present

## 2022-04-30 LAB — CBC WITH DIFFERENTIAL/PLATELET
Abs Immature Granulocytes: 0.03 10*3/uL (ref 0.00–0.07)
Basophils Absolute: 0 10*3/uL (ref 0.0–0.1)
Basophils Relative: 1 %
Eosinophils Absolute: 0.1 10*3/uL (ref 0.0–0.5)
Eosinophils Relative: 1 %
HCT: 36.9 % (ref 36.0–46.0)
Hemoglobin: 12 g/dL (ref 12.0–15.0)
Immature Granulocytes: 0 %
Lymphocytes Relative: 19 %
Lymphs Abs: 1.5 10*3/uL (ref 0.7–4.0)
MCH: 28.8 pg (ref 26.0–34.0)
MCHC: 32.5 g/dL (ref 30.0–36.0)
MCV: 88.5 fL (ref 80.0–100.0)
Monocytes Absolute: 0.7 10*3/uL (ref 0.1–1.0)
Monocytes Relative: 10 %
Neutro Abs: 5.3 10*3/uL (ref 1.7–7.7)
Neutrophils Relative %: 69 %
Platelets: 177 10*3/uL (ref 150–400)
RBC: 4.17 MIL/uL (ref 3.87–5.11)
RDW: 12.8 % (ref 11.5–15.5)
WBC: 7.6 10*3/uL (ref 4.0–10.5)
nRBC: 0 % (ref 0.0–0.2)

## 2022-04-30 LAB — URINALYSIS, ROUTINE W REFLEX MICROSCOPIC
Bilirubin Urine: NEGATIVE
Glucose, UA: NEGATIVE mg/dL
Hgb urine dipstick: NEGATIVE
Ketones, ur: NEGATIVE mg/dL
Leukocytes,Ua: NEGATIVE
Nitrite: NEGATIVE
Protein, ur: NEGATIVE mg/dL
Specific Gravity, Urine: 1.002 — ABNORMAL LOW (ref 1.005–1.030)
pH: 7 (ref 5.0–8.0)

## 2022-04-30 LAB — COMPREHENSIVE METABOLIC PANEL
ALT: 13 U/L (ref 0–44)
AST: 20 U/L (ref 15–41)
Albumin: 3.3 g/dL — ABNORMAL LOW (ref 3.5–5.0)
Alkaline Phosphatase: 81 U/L (ref 38–126)
Anion gap: 9 (ref 5–15)
BUN: <5 mg/dL — ABNORMAL LOW (ref 6–20)
CO2: 26 mmol/L (ref 22–32)
Calcium: 9.3 mg/dL (ref 8.9–10.3)
Chloride: 106 mmol/L (ref 98–111)
Creatinine, Ser: 0.85 mg/dL (ref 0.44–1.00)
GFR, Estimated: >60 mL/min (ref >60)
Glucose, Bld: 115 mg/dL — ABNORMAL HIGH (ref 70–99)
Potassium: 3.5 mmol/L (ref 3.5–5.1)
Sodium: 141 mmol/L (ref 135–145)
Total Bilirubin: 1 mg/dL (ref 0.3–1.2)
Total Protein: 6.4 g/dL — ABNORMAL LOW (ref 6.5–8.1)

## 2022-04-30 LAB — AMMONIA: Ammonia: 17 umol/L (ref 9–35)

## 2022-04-30 MED ORDER — QUETIAPINE FUMARATE 100 MG PO TABS
200.0000 mg | ORAL_TABLET | Freq: Every day | ORAL | Status: DC
  Administered 2022-04-30: 200 mg via ORAL
  Filled 2022-04-30: qty 2

## 2022-04-30 MED ORDER — SODIUM CHLORIDE 0.9 % IV SOLN
3.0000 g | Freq: Four times a day (QID) | INTRAVENOUS | Status: DC
  Administered 2022-04-30 – 2022-05-02 (×6): 3 g via INTRAVENOUS
  Filled 2022-04-30 (×7): qty 8

## 2022-04-30 NOTE — Evaluation (Signed)
Occupational Therapy Evaluation Patient Details Name: Amy Villa MRN: [id number] DOB: 07-31-1965 Today's Date: 04/30/2022   History of Present Illness 56 yo female [facility] with OD on prescription medication, received narcan. Pt admitted for Acute toxic/metabolic encephalopathy most likely due to hepatic encephalopathy and/or aspiration pneumonia, severe sepsis likely due to aspiration PNA. PMH includes CAD, hypertension, polysubstance abuse, alcoholic hepatitis, OA.   Clinical Impression   Patient admitted for the diagnosis above.  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  Patient needs no assist with ADL completion, walks without an assistive device, and her spouse handles community mobility and iADLs.  Patient is much more appropriate and oriented this date.  No OT needs exist in the acute setting.  Patient able to walk the halls without assist, and OT advised PT there are no acute care needs.  No post acute OT indicated.       Recommendations for follow up therapy are one component of a multi-disciplinary discharge planning process, led by the attending physician.  Recommendations may be updated based on patient status, additional functional criteria and insurance authorization.   Follow Up Recommendations  No OT follow up    Assistance Recommended at Discharge PRN  Patient can return home with the following Assist for transportation    Functional Status Assessment  Patient has not had a recent decline in their functional status  Equipment Recommendations  None recommended by OT    Recommendations for Other Services       Precautions / Restrictions Precautions Precautions: Other (comment) Precaution Comments: currently has a sitter Restrictions Weight Bearing Restrictions: No      Mobility Bed Mobility Overal bed mobility:  Independent                  Transfers Overall transfer level: Independent                        Balance Overall balance assessment: No apparent balance deficits (not formally assessed)                                         ADL either performed or assessed with clinical judgement   ADL Overall ADL's : At baseline                                             Vision Patient Visual Report: No change from baseline       Perception Perception Perception: Within Functional Limits   Praxis Praxis Praxis: Intact    Pertinent Vitals/Pain Pain Assessment Pain Assessment: No/denies pain     Hand Dominance Right   Extremity/Trunk Assessment Upper Extremity Assessment Upper Extremity Assessment: Overall WFL for tasks assessed;LUE deficits/detail LUE Deficits / Details: chronic wrist drop   Lower Extremity Assessment Lower Extremity Assessment: Overall WFL for tasks assessed   Cervical / Trunk Assessment Cervical / Trunk Assessment: Normal   Communication Communication Communication: No difficulties   Cognition Arousal/Alertness: Awake/alert Behavior During Therapy: WFL for tasks assessed/performed Overall Cognitive Status: Within Functional Limits for tasks assessed  General Comments: Very calm, appropriate, understands she is in the hospital.     General Comments   VSS on RA    Exercises     Shoulder Instructions      Home Living Family/patient expects to be discharged to:: Private residence Living Arrangements: Spouse/significant other Available Help at Discharge: Family;Available PRN/intermittently Type of Home: House Home Access: Stairs to enter [employer] of Steps: 2 Entrance Stairs-Rails: Right Home Layout: One level     Bathroom Shower/Tub: Occupational psychologist: Handicapped height Bathroom Accessibility: Yes   Home  Equipment: None          Prior Functioning/Environment Prior Level of Function : Independent/Modified Independent                        OT Problem List: Decreased safety awareness      OT Treatment/Interventions:      OT Goals(Current goals can be found in the care plan section) Acute Rehab OT Goals Patient Stated Goal: Find uot where my fever is coming from, and return home OT Goal Formulation: With patient Time For Goal Achievement: 05/08/22 Potential to Achieve Goals: Good  OT Frequency:      Co-evaluation              AM-PAC OT "6 Clicks" Daily Activity     Outcome Measure Help from another person eating meals?: None Help from another person taking care of personal grooming?: None Help from another person toileting, which includes using toliet, bedpan, or urinal?: None Help from another person bathing (including washing, rinsing, drying)?: None Help from another person to put on and taking off regular upper body clothing?: None Help from another person to put on and taking off regular lower body clothing?: None 6 Click Score: 24   End of Session Nurse Communication: Mobility status  Activity Tolerance: Patient tolerated treatment well Patient left: in bed;with call bell/phone within reach;with nursing/sitter in room  OT Visit Diagnosis: Other symptoms and signs involving cognitive function                Time: 7672-0947 OT Time Calculation (min): 21 min Charges:  OT General Charges $OT Visit: 1 Visit OT Evaluation $OT Eval Moderate Complexity: 1 Mod  04/30/2022  RP, OTR/L  Acute Rehabilitation Services  Office:  [phone]   Metta Clines 04/30/2022, 10:35 AM

## 2022-04-30 NOTE — Plan of Care (Signed)

## 2022-04-30 NOTE — Progress Notes (Signed)
PT Cancellation Note  Patient Details Name: Amy Villa MRN: [id number] DOB: 07-02-1966   Cancelled Treatment:    Reason Eval/Treat Not Completed: PT screened, no needs identified, will sign off - pt ambulating independently around the unit during OT eval, per OT no acute or post-acute needs. PT to sign off, please reconsult if needed.  Stacie Glaze, PT DPT Acute Rehabilitation Services Pager [phone]  Office [phone]    [place] 04/30/2022, 10:30 AM

## 2022-04-30 NOTE — Progress Notes (Signed)
Pharmacy Antibiotic Note  Amy Villa is a 56 y.o. female admitted on 04/27/2022 with  asp pna .  Pharmacy has been consulted for Unasyn dosing. Pt has been on Cefepime/Vanc/Flagyl (today is Day #4 of abx).  Plan: Unasyn 3gm IV q6h Will f/u renal function, micro data, and pt's clinical condition F/u LOT  Weight: 60.3 kg (132 lb 15 oz)  Temp (24hrs), Avg:99.2 F (37.3 C), Min:98.3 F (36.8 C), Max:101.3 F (38.5 C)  Recent Labs  Lab 04/27/22 2220 04/27/22 2233 04/27/22 2310 04/28/22 0118 04/28/22 0312 04/29/22 0029 04/30/22 0244  WBC 43.0*  --   --   --  32.5* 12.9* 7.6  CREATININE 2.49* 2.40*  --   --  1.82* 1.09* 0.85  LATICACIDVEN  --   --  2.8* 1.3  --   --   --   VANCORANDOM  --   --   --   --   --  7  --     Estimated Creatinine Clearance: 61.9 mL/min (by C-G formula based on SCr of 0.85 mg/dL).    Allergies  Allergen Reactions   Hydroxyamine Other (See Comments)    Not effective   Lyrica [Pregabalin] Other (See Comments)    Angry, upset     Antimicrobials this admission: Cefepime 10/9 >>  Vancomycin 10/9>>   Flagyl 10/9>>    Microbiology results: 10/9 BCx: ngtd  10/12 BCx: ngtd  Thank you for allowing pharmacy to be a part of this patient's care.  Sherlon Handing, PharmD, BCPS Please see amion for complete clinical pharmacist phone list 04/30/2022 3:50 PM

## 2022-04-30 NOTE — Plan of Care (Signed)
  Problem: Education: Goal: Knowledge of General Education information will improve Description: Including pain rating scale, medication(s)/side effects and non-pharmacologic comfort measures 04/30/2022 0452 by Dorena Cookey, LPN Outcome: Progressing 04/30/2022 0104 by Dorena Cookey, LPN Outcome: Progressing   Problem: Health Behavior/Discharge Planning: Goal: Ability to manage health-related needs will improve 04/30/2022 0452 by Dorena Cookey, LPN Outcome: Progressing 04/30/2022 0104 by Dorena Cookey, LPN Outcome: Progressing   Problem: Clinical Measurements: Goal: Ability to maintain clinical measurements within normal limits will improve 04/30/2022 0452 by Dorena Cookey, LPN Outcome: Progressing 04/30/2022 0104 by Dorena Cookey, LPN Outcome: Progressing Goal: Will remain free from infection 04/30/2022 0452 by Dorena Cookey, LPN Outcome: Progressing 04/30/2022 0104 by Dorena Cookey, LPN Outcome: Progressing Goal: Diagnostic test results will improve 04/30/2022 0452 by Dorena Cookey, LPN Outcome: Progressing 04/30/2022 0104 by Dorena Cookey, LPN Outcome: Progressing Goal: Respiratory complications will improve 04/30/2022 0452 by Dorena Cookey, LPN Outcome: Progressing 04/30/2022 0104 by Dorena Cookey, LPN Outcome: Progressing Goal: Cardiovascular complication will be avoided 04/30/2022 0452 by Dorena Cookey, LPN Outcome: Progressing 04/30/2022 0104 by Dorena Cookey, LPN Outcome: Progressing   Problem: Activity: Goal: Risk for activity intolerance will decrease 04/30/2022 0452 by Dorena Cookey, LPN Outcome: Progressing 04/30/2022 0104 by Dorena Cookey, LPN Outcome: Progressing   Problem: Nutrition: Goal: Adequate nutrition will be maintained 04/30/2022 0452 by Dorena Cookey, LPN Outcome: Progressing 04/30/2022 0104 by Dorena Cookey, LPN Outcome: Progressing   Problem: Coping: Goal: Level  of anxiety will decrease 04/30/2022 0452 by Dorena Cookey, LPN Outcome: Progressing 04/30/2022 0104 by Dorena Cookey, LPN Outcome: Progressing   Problem: Elimination: Goal: Will not experience complications related to bowel motility 04/30/2022 0452 by Dorena Cookey, LPN Outcome: Progressing 04/30/2022 0104 by Dorena Cookey, LPN Outcome: Progressing   Problem: Pain Managment: Goal: General experience of comfort will improve 04/30/2022 0452 by Dorena Cookey, LPN Outcome: Progressing 04/30/2022 0104 by Dorena Cookey, LPN Outcome: Progressing   Problem: Safety: Goal: Ability to remain free from injury will improve 04/30/2022 0452 by Dorena Cookey, LPN Outcome: Progressing 04/30/2022 0104 by Dorena Cookey, LPN Outcome: Progressing   Problem: Skin Integrity: Goal: Risk for impaired skin integrity will decrease 04/30/2022 0452 by Dorena Cookey, LPN Outcome: Progressing 04/30/2022 0104 by Dorena Cookey, LPN Outcome: Progressing

## 2022-04-30 NOTE — Progress Notes (Addendum)
PROGRESS NOTE  Amy Villa KYH:[id number] DOB: 03-21-1966 DOA: 04/27/2022 PCP: Heywood Bene, PA-C  HPI/Recap of past 24 hours: Amy Villa is a 56 y.o. with history of CAD, hypertension, polysubstance abuse presented with concern for an overdose after found to be unresponsive PTA. EMS was called, patient was given Narcan. On presentation she was hypoxic satting in the low 80s, tachycardic and normotensive. Urine drug screen negative for opiates. Urinalysis showed no concern for infection.  Chest x-ray showed emphysema without focal consolidations.  CT head showed no acute intracranial abnormality.  CT chest abdomen pelvis demonstrated no acute findings. Patient was admitted for further management.  She remained borderline hypoxic and was placed on Narcan drip. On exam patient had notable asterixis and denied drug use.     Last evening, patient was noted to be very agitated, hallucinating, paranoia, attempted to run away from the hospital, ran into a patient's room and security was called.  Patient likely withdrawing from alcohol and possible substance use as per patient's husband.  Patient had to be IVCed on 04/29/2022.  Today, patient more cooperative, calm.  Noted to have temperature spike last evening, currently denies any new complaints.  Sepsis work-up underway.     Assessment/Plan: Principal Problem:   Sepsis ([place]) Active Problems:   AMS (altered mental status)   Drug overdose   Acute toxic/metabolic encephalopathy most likely due to hepatic encephalopathy and/or aspiration pneumonia, POA, active Alcohol withdrawal Encephalopathy improving today UDS negative-although husband reports possible heroin ingestion (son abuses drugs and came visiting shortly after patient was noted to be unresponsive) Management as noted below Psychiatry consulted  Alcoholic cirrhosis Noted possible hepatic encephalopathy Alcohol withdrawal Elevated ammonia, now WNL INR  pending LFTs WNL CT abdomen showed nodular liver consistent with cirrhosis, no significant ascites Continue lactulose Daily CMP Advised to quit alcohol, patient willing  Likely aspiration pneumonia/severe sepsis POA Acute hypoxic respiratory failure- improving Possible undiagnosed COPD Last temp spike on 04/29/2022, ??101.3, with resolved  leukocytosis Lactic acidosis, currently resolved Supplemental O2 prn Repeat chest x-ray showed new mild right upper lobe opacity likely due to atelectasis or early pneumonia BC x2 pending CT chest unremarkable, except for notable emphysema S/p vancomycin, Flagyl, cefepime switched to IV Unasyn Continue supplemental oxygen prn Monitor closely  Acute kidney injury Improving Daily CMP   CAD Hypertension Currently chest pain-free Not on any BP meds  GERD Continue PPI   History of depression Unable to verify med rec Patient was on Lamictal, Trileptal, restart Seroquel.       Estimated body mass index is 23.55 kg/m as calculated from the following:   Height as of 03/05/22: '5\' 3"'$  (1.6 m).   Weight as of this encounter: 60.3 kg.     Code Status: Full  Family Communication: None at bedside  Disposition Plan: Status is: Inpatient Remains inpatient appropriate because: Level of care      Consultants: Psychiatry  Procedures: None  Antimicrobials: Unasyn  DVT prophylaxis: Lovenox   Objective: Vitals:   04/30/22 0427 04/30/22 0754 04/30/22 1108 04/30/22 1452  BP: (!) 163/93 (!) 169/95 (!) 162/86 (!) 173/104  Pulse: 85 83 89 89  Resp:  '16 14 14  '$ Temp: 98.6 F (37 C) 98.4 F (36.9 C) 98.3 F (36.8 C) 99.5 F (37.5 C)  TempSrc: Oral Oral Oral Oral  SpO2: 92% 96% 99% 100%  Weight:        Intake/Output Summary (Last 24 hours) at 04/30/2022 1520 Last data filed at 04/30/2022 1306  Gross per 24 hour  Intake 900 ml  Output 300 ml  Net 600 ml   Filed Weights   04/28/22 0800  Weight: 60.3 kg    Exam: General:  NAD, chronically ill-appearing, awake/alert, oriented Cardiovascular: S1, S2 present Respiratory: Diminished breath sounds bilaterally Abdomen: Soft, nontender, nondistended, bowel sounds present Musculoskeletal: No bilateral pedal edema noted Skin: Normal Psychiatry: Fair mood     Data Reviewed: CBC: Recent Labs  Lab 04/27/22 2220 04/27/22 2233 04/28/22 0312 04/29/22 0029 04/30/22 0244  WBC 43.0*  --  32.5* 12.9* 7.6  NEUTROABS 39.1*  --   --   --  5.3  HGB 16.0* 18.4* 13.9 12.6 12.0  HCT 49.5* 54.0* 42.5 39.6 36.9  MCV 89.7  --  91.4 91.7 88.5  PLT 324  --  296 198 448   Basic Metabolic Panel: Recent Labs  Lab 04/27/22 2220 04/27/22 2233 04/28/22 0312 04/29/22 0029 04/30/22 0244  NA 132* 134* 133* 137 141  K 5.8* 5.9* 5.3* 3.8 3.5  CL 91* 93* 101 106 106  CO2 22  --  20* 22 26  GLUCOSE 128* 125* 124* 147* 115*  BUN '13 19 11 6 '$ <5*  CREATININE 2.49* 2.40* 1.82* 1.09* 0.85  CALCIUM 9.5  --  8.6* 8.9 9.3   GFR: Estimated Creatinine Clearance: 61.9 mL/min (by C-G formula based on SCr of 0.85 mg/dL). Liver Function Tests: Recent Labs  Lab 04/27/22 2220 04/28/22 0312 04/30/22 0244  AST 38 25 20  ALT '22 18 13  '$ ALKPHOS 101 89 81  BILITOT 0.7 0.4 1.0  PROT 8.9* 7.5 6.4*  ALBUMIN 5.0 4.1 3.3*   No results for input(s): "LIPASE", "AMYLASE" in the last 168 hours. Recent Labs  Lab 04/28/22 0843 04/29/22 0954 04/30/22 0244  AMMONIA 42* 44* 17   Coagulation Profile: No results for input(s): "INR", "PROTIME" in the last 168 hours. Cardiac Enzymes: Recent Labs  Lab 04/27/22 2220  CKTOTAL 89   BNP (last 3 results) No results for input(s): "PROBNP" in the last 8760 hours. HbA1C: No results for input(s): "HGBA1C" in the last 72 hours. CBG: No results for input(s): "GLUCAP" in the last 168 hours. Lipid Profile: No results for input(s): "CHOL", "HDL", "LDLCALC", "TRIG", "CHOLHDL", "LDLDIRECT" in the last 72 hours. Thyroid Function Tests: No results for  input(s): "TSH", "T4TOTAL", "FREET4", "T3FREE", "THYROIDAB" in the last 72 hours. Anemia Panel: No results for input(s): "VITAMINB12", "FOLATE", "FERRITIN", "TIBC", "IRON", "RETICCTPCT" in the last 72 hours. Urine analysis:    Component Value Date/Time   COLORURINE YELLOW 04/27/2022 2330   APPEARANCEUR CLEAR 04/27/2022 2330   LABSPEC 1.020 04/27/2022 2330   PHURINE 6.0 04/27/2022 2330   GLUCOSEU NEGATIVE 04/27/2022 2330   GLUCOSEU NEGATIVE 07/22/2016 1204   HGBUR NEGATIVE 04/27/2022 2330   BILIRUBINUR NEGATIVE 04/27/2022 2330   KETONESUR NEGATIVE 04/27/2022 2330   PROTEINUR 30 (A) 04/27/2022 2330   UROBILINOGEN 0.2 07/22/2016 1204   NITRITE NEGATIVE 04/27/2022 2330   LEUKOCYTESUR NEGATIVE 04/27/2022 2330   Sepsis Labs: '@LABRCNTIP'$ (procalcitonin:4,lacticidven:4)  ) Recent Results (from the past 240 hour(s))  Culture, blood (single)     Status: None (Preliminary result)   Collection Time: 04/27/22 11:15 PM   Specimen: BLOOD  Result Value Ref Range Status   Specimen Description BLOOD SITE NOT SPECIFIED  Final   Special Requests   Final    BOTTLES DRAWN AEROBIC AND ANAEROBIC Blood Culture adequate volume   Culture   Final    NO GROWTH 3 DAYS Performed at [facility], [place] [address] [place], [place] [address]    Report Status PENDING  Incomplete  Resp Panel by RT-PCR (Flu A&B, Covid)     Status: None   Collection Time: 04/27/22 11:15 PM   Specimen: Nasal Swab  Result Value Ref Range Status   SARS Coronavirus 2 by RT PCR NEGATIVE NEGATIVE Final    Comment: (NOTE) SARS-CoV-2 target nucleic acids are NOT DETECTED.  The SARS-CoV-2 RNA is generally detectable in upper respiratory specimens during the acute phase of infection. The lowest concentration of SARS-CoV-2 viral copies this assay can detect is 138 copies/mL. A negative result does not preclude SARS-Cov-2 infection and should not be used as the sole basis for treatment or other patient management decisions. A  negative result may occur with  improper specimen collection/handling, submission of specimen other than nasopharyngeal swab, presence of viral mutation(s) within the areas targeted by this assay, and inadequate number of viral copies(<138 copies/mL). A negative result must be combined with clinical observations, patient history, and epidemiological information. The expected result is Negative.  Fact Sheet for Patients:  EntrepreneurPulse.com.au  Fact Sheet for Healthcare Providers:  IncredibleEmployment.be  This test is no t yet approved or cleared by the [place] FDA and  has been authorized for detection and/or diagnosis of SARS-CoV-2 by FDA under an Emergency Use Authorization (EUA). This EUA will remain  in effect (meaning this test can be used) for the duration of the COVID-19 declaration under Section 564(b)(1) of the Act, 21 U.S.C.section 360bbb-3(b)(1), unless the authorization is terminated  or revoked sooner.       Influenza A by PCR NEGATIVE NEGATIVE Final   Influenza B by PCR NEGATIVE NEGATIVE Final    Comment: (NOTE) The Xpert Xpress SARS-CoV-2/FLU/RSV plus assay is intended as an aid in the diagnosis of influenza from Nasopharyngeal swab specimens and should not be used as a sole basis for treatment. Nasal washings and aspirates are unacceptable for Xpert Xpress SARS-CoV-2/FLU/RSV testing.  Fact Sheet for Patients: EntrepreneurPulse.com.au  Fact Sheet for Healthcare Providers: IncredibleEmployment.be  This test is not yet approved or cleared by the [place] FDA and has been authorized for detection and/or diagnosis of SARS-CoV-2 by FDA under an Emergency Use Authorization (EUA). This EUA will remain in effect (meaning this test can be used) for the duration of the COVID-19 declaration under Section 564(b)(1) of the Act, 21 U.S.C. section 360bbb-3(b)(1), unless the authorization  is terminated or revoked.  Performed at [facility], [place] [address] [place], [place] [address]   Culture, blood (Routine X 2) w Reflex to ID Panel     Status: None (Preliminary result)   Collection Time: 04/30/22  8:29 AM   Specimen: BLOOD LEFT HAND  Result Value Ref Range Status   Specimen Description BLOOD LEFT HAND  Final   Special Requests   Final    BOTTLES DRAWN AEROBIC AND ANAEROBIC Blood Culture adequate volume   Culture   Final    NO GROWTH < 12 HOURS Performed at [facility], [place] [address] [place], [place] [address]    Report Status PENDING  Incomplete  Culture, blood (Routine X 2) w Reflex to ID Panel     Status: None (Preliminary result)   Collection Time: 04/30/22  8:35 AM   Specimen: BLOOD  Result Value Ref Range Status   Specimen Description BLOOD RIGHT ANTECUBITAL  Final   Special Requests   Final    BOTTLES DRAWN AEROBIC AND ANAEROBIC Blood Culture  adequate volume   Culture   Final    NO GROWTH < 12 HOURS Performed at [place] [address], [place], [place] [address]    Report Status PENDING  Incomplete      Studies: DG CHEST PORT 1 VIEW  Result Date: 04/30/2022 CLINICAL DATA:  Shortness of breath EXAM: PORTABLE CHEST 1 VIEW COMPARISON:  Chest x-ray dated October 9th 2023; CT chest, abdomen and pelvis dated April 27, 2022 FINDINGS: The heart size and mediastinal contours are within normal limits. Small nodular opacity of the right upper lobe which correlates with pulmonary nodule seen on recent prior CT. Mild right upper lobe opacity. Mild bibasilar atelectasis. The visualized skeletal structures are unremarkable. IMPRESSION: 1. New mild right upper lobe opacity may be due to atelectasis or early pneumonia. 2. Mild bibasilar atelectasis. 3. Small nodular opacity of the right upper lobe which correlates with pulmonary nodule seen on recent prior CT. Recommend follow-up chest CT in 3 months to ensure resolution. Electronically Signed    By: Yetta Glassman M.D.   On: 04/30/2022 08:59    Scheduled Meds:  enoxaparin (LOVENOX) injection  40 mg Subcutaneous Daily   folic acid  1 mg Oral Daily   gabapentin  300 mg Oral BID   hydrOXYzine  50 mg Oral TID   lactulose  30 g Oral TID   multivitamin with minerals  1 tablet Oral Daily   pantoprazole  40 mg Oral QAC breakfast   QUEtiapine  200 mg Oral QHS   thiamine  100 mg Oral Daily    Continuous Infusions:  ceFEPime (MAXIPIME) IV 2 g (04/30/22 1224)   metronidazole 500 mg (04/30/22 0928)   vancomycin 1,000 mg (04/30/22 1054)     LOS: 2 days     Alma Friendly, MD [facility]  If 7PM-7AM, please contact night-coverage [email or web address] 04/30/2022, 3:20 PM

## 2022-05-01 DIAGNOSIS — R4182 Altered mental status, unspecified: Secondary | ICD-10-CM | POA: Diagnosis not present

## 2022-05-01 DIAGNOSIS — K7682 Hepatic encephalopathy: Secondary | ICD-10-CM | POA: Diagnosis not present

## 2022-05-01 DIAGNOSIS — T50904A Poisoning by unspecified drugs, medicaments and biological substances, undetermined, initial encounter: Secondary | ICD-10-CM | POA: Diagnosis not present

## 2022-05-01 DIAGNOSIS — G9341 Metabolic encephalopathy: Secondary | ICD-10-CM | POA: Diagnosis not present

## 2022-05-01 LAB — CBC WITH DIFFERENTIAL/PLATELET
Abs Immature Granulocytes: 0.04 10*3/uL (ref 0.00–0.07)
Basophils Absolute: 0 10*3/uL (ref 0.0–0.1)
Basophils Relative: 1 %
Eosinophils Absolute: 0.1 10*3/uL (ref 0.0–0.5)
Eosinophils Relative: 1 %
HCT: 32.9 % — ABNORMAL LOW (ref 36.0–46.0)
Hemoglobin: 11.6 g/dL — ABNORMAL LOW (ref 12.0–15.0)
Immature Granulocytes: 1 %
Lymphocytes Relative: 18 %
Lymphs Abs: 1.5 10*3/uL (ref 0.7–4.0)
MCH: 29.6 pg (ref 26.0–34.0)
MCHC: 35.3 g/dL (ref 30.0–36.0)
MCV: 83.9 fL (ref 80.0–100.0)
Monocytes Absolute: 0.7 10*3/uL (ref 0.1–1.0)
Monocytes Relative: 9 %
Neutro Abs: 5.8 10*3/uL (ref 1.7–7.7)
Neutrophils Relative %: 70 %
Platelets: 177 10*3/uL (ref 150–400)
RBC: 3.92 MIL/uL (ref 3.87–5.11)
RDW: 12.7 % (ref 11.5–15.5)
WBC: 8.3 10*3/uL (ref 4.0–10.5)
nRBC: 0 % (ref 0.0–0.2)

## 2022-05-01 LAB — COMPREHENSIVE METABOLIC PANEL
ALT: 12 U/L (ref 0–44)
AST: 16 U/L (ref 15–41)
Albumin: 3.1 g/dL — ABNORMAL LOW (ref 3.5–5.0)
Alkaline Phosphatase: 72 U/L (ref 38–126)
Anion gap: 10 (ref 5–15)
BUN: <5 mg/dL — ABNORMAL LOW (ref 6–20)
CO2: 24 mmol/L (ref 22–32)
Calcium: 8.8 mg/dL — ABNORMAL LOW (ref 8.9–10.3)
Chloride: 107 mmol/L (ref 98–111)
Creatinine, Ser: 0.64 mg/dL (ref 0.44–1.00)
GFR, Estimated: >60 mL/min (ref >60)
Glucose, Bld: 143 mg/dL — ABNORMAL HIGH (ref 70–99)
Potassium: 2.6 mmol/L — CL (ref 3.5–5.1)
Sodium: 141 mmol/L (ref 135–145)
Total Bilirubin: 1.2 mg/dL (ref 0.3–1.2)
Total Protein: 6.3 g/dL — ABNORMAL LOW (ref 6.5–8.1)

## 2022-05-01 LAB — MAGNESIUM: Magnesium: 1.5 mg/dL — ABNORMAL LOW (ref 1.7–2.4)

## 2022-05-01 LAB — PROTIME-INR
INR: 1.4 — ABNORMAL HIGH (ref 0.8–1.2)
Prothrombin Time: 16.7 seconds — ABNORMAL HIGH (ref 11.4–15.2)

## 2022-05-01 MED ORDER — MAGNESIUM SULFATE 2 GM/50ML IV SOLN
2.0000 g | Freq: Once | INTRAVENOUS | Status: AC
  Administered 2022-05-01: 2 g via INTRAVENOUS
  Filled 2022-05-01: qty 50

## 2022-05-01 MED ORDER — POTASSIUM CHLORIDE CRYS ER 20 MEQ PO TBCR
40.0000 meq | EXTENDED_RELEASE_TABLET | Freq: Three times a day (TID) | ORAL | Status: AC
  Administered 2022-05-01 (×3): 40 meq via ORAL
  Filled 2022-05-01 (×3): qty 2

## 2022-05-01 MED ORDER — DIPHENHYDRAMINE HCL 50 MG/ML IJ SOLN
25.0000 mg | Freq: Once | INTRAMUSCULAR | Status: AC
  Administered 2022-05-02: 25 mg via INTRAVENOUS
  Filled 2022-05-01: qty 1

## 2022-05-01 MED ORDER — TRAZODONE HCL 50 MG PO TABS
100.0000 mg | ORAL_TABLET | Freq: Every day | ORAL | Status: DC
  Administered 2022-05-01: 100 mg via ORAL
  Filled 2022-05-01: qty 2

## 2022-05-01 MED ORDER — DULOXETINE HCL 60 MG PO CPEP
60.0000 mg | ORAL_CAPSULE | Freq: Every day | ORAL | Status: DC
  Administered 2022-05-01 – 2022-05-02 (×2): 60 mg via ORAL
  Filled 2022-05-01 (×2): qty 1

## 2022-05-01 MED ORDER — QUETIAPINE FUMARATE 100 MG PO TABS
300.0000 mg | ORAL_TABLET | Freq: Every day | ORAL | Status: DC
  Administered 2022-05-01: 300 mg via ORAL
  Filled 2022-05-01: qty 3

## 2022-05-01 NOTE — Consult Note (Signed)
[facility] New Face-to-Face Psychiatric Evaluation   Service Date: May 01, 2022 LOS:  LOS: 3 days    Assessment  Amy Villa is a 56 y.o. female admitted medically for 04/27/2022 10:10 PM for unresponsiveness. She carries the psychiatric diagnoses of opiate use disorder, alcohol use disorder, major depressive disorder, generalized anxiety disorder, and possibly bipolar disorder and has a past medical history of  CAD, hypertension.Psychiatry was consulted for psychosis by Dr Horris Latino.    Given patient's relatively prompt return from agitation/encephalopathy, this is most consistent with acute toxic encephalopathy secondary to opiate use.  Recommend SA IOP for severe alcohol use disorder but patient seems precontemplative about alcohol cessation as she continues to deflect and minimize alcohol use. Current outpatient psychotropic medications include duloxetine, Seroquel, hydroxyzine, trazodone and historically she has had a fair response to these medications. She was compliant with medications prior to admission as evidenced by appropriate fill history. On initial examination, patient was linear, coherent and back to baseline. Please see plan below for detailed recommendations.   Diagnoses:  Active Hospital problems: Principal Problem:   Sepsis ([place]) Active Problems:   AMS (altered mental status)   Drug overdose     Plan  ## Safety and Observation Level:  - Based on my clinical evaluation, I estimate the patient to be at minimal Villa of self harm in the current setting - Discontinue 1:1. No longer confused. -IVC can be rescinded as she is no longer agitated   ## Medications:  --Resume Cymbalta 60 mg --Resume hydralazine 50 mg 3 times daily --Resume Seroquel 300 mg nightly --Resume trazodone 100 mg nightly --Outpatient psychiatry follow-up for further medication titrations --Referral to SA IOP  ## Medical Decision Making Capacity:  Did not formally  assess  ## Further Work-up:  -- none   -- most recent EKG had QtC of 442 -- Pertinent labwork reviewed earlier this admission includes:   ## Disposition:  -- home  ## Behavioral / Environmental:  -- discontinue 1:1  ##Legal Status Rescind IVC  Thank you for this consult request. Recommendations have been communicated to the primary team.  We will sign off at this time.   France Ravens, MD   History  Relevant Aspects of Hospital Course:  Admitted on 04/27/2022 for unresponsiveness secondary to heroin use.  Patient Report:  Patient reports altered mental status secondary to her opiate use.  Patient minimizes her alcohol use to Thursday through Sunday 3-5 beers per day.  Patient has extensive history of alcohol use and appears to insist that it was her husband's fault that she will continue to drink.  Patient had been discharged from [facility] a few months ago and because her husband continued drinking, she decided that she would continue drinking as well.  Patient feels that she is more likely to quit this time around because husband has quit alcohol and removed all alcohol from the house.  Patient also states that she is not allowing son to visit their home because her son always brings drugs and she feels that she would use the drugs should her son stop by.  Patient admits that prior to her hospitalization, she had snorted some heroin which ultimately led to her hospitalization.  Patient offers limited insight regarding her own substance use and addiction.  Patient reports having poor sleep last night because she did not get her Seroquel.  Discussed that her Seroquel was given to her last night and she insisted that it was not at the  right dose as she normally takes Seroquel 300 mg nightly rather than 200 mg that they apparently had not given her despite the [facility] showing otherwise.  Patient does mention that her outpatient psychiatrist was trying to taper her off of the Seroquel and  increase her trazodone dose but insist that she should be continued on present Seroquel dosage.  Patient also is requesting that we resume her Cymbalta and trazodone.  Patient endorses history of PTSD.  Patient also admits to some diagnosis of depression and anxiety but is unclear whether this is major depressive disorder and generalized anxiety.  Patient also states that she does not know whether she has bipolar disorder but denies ever experiencing a manic episode.  Discussed that given her substance use, it is hard to diagnose someone with bipolar disorder as substance use can affect mood.  Patient verbalized understanding.  Patient denies present SI/HI/AVH.  ROS:  Denies si/hi/avh  Collateral information:  Per chart review, patient is much calmer and no longer psychotic.   Psychiatric History:  Information collected from patient Recent hospitalization at [facility] Diagnosed with depression, anxiety, PTSD, and possibly bipolar disorder.  Also carries the diagnoses of alcohol use disorder and opiate misuse  Family psych history: unknown   Social History:  Tobacco use: unknown Alcohol use: endorses 3-5 beers 4 days/wk Drug use: whenever son brings to house  Family History:   The patient's family history includes Colon polyps in her sister; Diabetes in her mother and sister; Hypertension in her mother and sister; Lung cancer in her maternal grandmother.  Medical History: Past Medical History:  Diagnosis Date   Alcoholic hepatitis    Aortic atherosclerosis (HCC)    Arthritis    neck   Carpal tunnel syndrome on both sides 02/2012   Cigarette nicotine dependence    Contact lens/glasses fitting    wears contacts or glasses   Dental crowns present    x 2 upper front   Elevated LFTs    Fatty liver    Fluid retention    History of MRSA infection    Hypertension    Insomnia    Migraines    Osteoarthritis    Parasomnia    Wears partial dentures    lower    Surgical  History: Past Surgical History:  Procedure Laterality Date   ANKLE ARTHROSCOPY WITH RECONSTRUCTION  07/2017   BIOPSY  02/24/2018   Procedure: BIOPSY;  Surgeon: Jackquline Denmark, MD;  Location: [facility];  Service: Endoscopy;;   CARPAL TUNNEL RELEASE  03/17/2012   Procedure: CARPAL TUNNEL RELEASE;  Surgeon: Roseanne Kaufman, MD;  Location: [place];  Service: Orthopedics;  Laterality: Bilateral;  left limited open carpal tunnel release. right carpal tunnel injection with 1cc of celestone and 2cc of marcaine.25%   CARPAL TUNNEL RELEASE  06/30/2012   Procedure: CARPAL TUNNEL RELEASE;  Surgeon: Roseanne Kaufman, MD;  Location: [place];  Service: Orthopedics;  Laterality: Right;  RIGHT LIMITED OPEN CARPAL TUNNEL RELEASE   CARPOMETACARPAL ([facility]) FUSION OF THUMB Left 06/23/2013   Procedure: LEFT CARPOMETACARPEL ([place]) FUSION OF THUMB WITH AUTOGRAFT FROM RADIUS AND REPAIR AS NECESSARY;  Surgeon: Roseanne Kaufman, MD;  Location: [place];  Service: Orthopedics;  Laterality: Left;   COLONOSCOPY  2014   ESOPHAGOGASTRODUODENOSCOPY  05/2017   ESOPHAGOGASTRODUODENOSCOPY (EGD) WITH PROPOFOL N/A 02/24/2018   Procedure: ESOPHAGOGASTRODUODENOSCOPY (EGD) WITH PROPOFOL;  Surgeon: Jackquline Denmark, MD;  [facility];  Service: Endoscopy;  Laterality: N/A;   HARDWARE REMOVAL Left 07/10/2013  Procedure: HARDWARE REMOVAL;  Surgeon: Roseanne Kaufman, MD;  [facility]: [facility];  Service: Orthopedics;  Laterality: Left;   INCISION AND DRAINAGE OF WOUND Left 07/10/2013   Procedure: IRRIGATION AND DEBRIDEMENT WOUND left wrist  ;  Surgeon: Roseanne Kaufman, MD;  [facility]: [facility];  Service: Orthopedics;  Laterality: Left;   LAPAROSCOPIC VAGINAL HYSTERECTOMY  02/24/2010   LAPAROSCOPY  06/2010   for adhesions   LEEP     x 2   LUMBAR DISC SURGERY  03/23/2003   left L5-S1 discectomy with microdissection   LUMBAR DISC SURGERY  05/23/2003   redo discectomy L5-S1 with microdissection    Medications:    Current Facility-Administered Medications:    acetaminophen (TYLENOL) tablet 650 mg, 650 mg, Oral, Q6H PRN, 650 mg at 04/30/22 1535 **OR** acetaminophen (TYLENOL) suppository 650 mg, 650 mg, Rectal, Q6H PRN, Dorrell, Robert, MD   Ampicillin-Sulbactam (UNASYN) 3 g in sodium chloride 0.9 % 100 mL IVPB, 3 g, Intravenous, Q6H, Franky Macho, RPH, Last Rate: 200 mL/hr at 05/01/22 1506, 3 g at 05/01/22 1506   DULoxetine (CYMBALTA) DR capsule 60 mg, 60 mg, Oral, Daily, France Ravens, MD   enoxaparin (LOVENOX) injection 40 mg, 40 mg, Subcutaneous, Daily, Alma Friendly, MD, 40 mg at 94/70/96 2836   folic acid (FOLVITE) tablet 1 mg, 1 mg, Oral, Daily, Dorrell, Robert, MD, 1 mg at 05/01/22 0809   gabapentin (NEURONTIN) capsule 300 mg, 300 mg, Oral, BID, Dorrell, Robert, MD, 300 mg at 05/01/22 0809   hydrOXYzine (ATARAX) tablet 25 mg, 25 mg, Oral, Q6H PRN, Alma Friendly, MD, 25 mg at 05/01/22 1154   hydrOXYzine (ATARAX) tablet 50 mg, 50 mg, Oral, TID, Dorrell, Robert, MD, 50 mg at 05/01/22 1502   lactulose (CHRONULAC) 10 GM/15ML solution 20 g, 20 g, Oral, Daily PRN, Dorrell, Robert, MD   lactulose (CHRONULAC) 10 GM/15ML solution 30 g, 30 g, Oral, TID, Dorrell, Robert, MD, 30 g at 05/01/22 1503   loperamide (IMODIUM) capsule 2-4 mg, 2-4 mg, Oral, PRN, Alma Friendly, MD   LORazepam (ATIVAN) tablet 1 mg, 1 mg, Oral, Q6H PRN, Alma Friendly, MD, 1 mg at 04/29/22 1956   multivitamin with minerals tablet 1 tablet, 1 tablet, Oral, Daily, Alma Friendly, MD, 1 tablet at 05/01/22 0809   naloxone (NARCAN) injection 0.4 mg, 0.4 mg, Intravenous, PRN, Vann, Jessica U, DO   ondansetron (ZOFRAN-ODT) disintegrating tablet 4 mg, 4 mg, Oral, Q6H PRN, Alma Friendly, MD   oxyCODONE (Oxy IR/ROXICODONE) immediate release tablet 5 mg, 5 mg, Oral, Q4H PRN, Dorrell, Robert, MD   pantoprazole (PROTONIX) EC tablet 40 mg, 40 mg, Oral, QAC breakfast, Dorrell, Robert, MD, 40 mg at 05/01/22 0809    potassium chloride SA (KLOR-CON M) CR tablet 40 mEq, 40 mEq, Oral, TID, Alma Friendly, MD, 40 mEq at 05/01/22 1503   QUEtiapine (SEROQUEL) tablet 300 mg, 300 mg, Oral, QHS, France Ravens, MD   thiamine (VITAMIN B1) tablet 100 mg, 100 mg, Oral, Daily, Alma Friendly, MD, 100 mg at 05/01/22 0810   traZODone (DESYREL) tablet 100 mg, 100 mg, Oral, QHS, France Ravens, MD  Allergies: Allergies  Allergen Reactions   Hydroxyamine Other (See Comments)    Not effective   Lyrica [Pregabalin] Other (See Comments)    Angry, upset        Objective  Vital signs:  Temp:  [97.6 F (36.4 C)-99.4 F (37.4 C)] 97.8 F (36.6 C) (10/13 1146) Pulse Rate:  [60-88] 88 (10/13 0735) Resp:  [  14-18] 16 (10/13 1146) BP: (145-151)/(76-89) 148/83 (10/13 0735) SpO2:  [96 %-100 %] 99 % (10/13 0735)  Psychiatric Specialty Exam:  Presentation  General Appearance:  Casual  Eye Contact: Fair  Speech: Clear and Coherent; Normal Rate  Speech Volume: Normal  Handedness:No data recorded  Mood and Affect  Mood: Anxious  Affect: Congruent   Thought Process  Thought Processes: Coherent; Goal Directed; Linear  Descriptions of Associations:Intact  Orientation:Full (Time, Place and Person)  Thought Content:Logical  History of Schizophrenia/Schizoaffective disorder:No data recorded Duration of Psychotic Symptoms:No data recorded Hallucinations:No data recorded Ideas of Reference:None  Suicidal Thoughts:No data recorded Homicidal Thoughts:No data recorded  Sensorium  Memory: Immediate Fair  Judgment: Intact  Insight: Present   Executive Functions  Concentration: Fair  Attention Span: Fair  Recall: [employer] of Knowledge: Fair  Language: Fair   Psychomotor Activity  Psychomotor Activity:No data recorded  Assets  Assets: Social Support; Catering manager; Housing   Sleep  Sleep:No data recorded   Physical Exam: Physical Exam ROS Blood  pressure (!) 148/83, pulse 88, temperature 97.8 F (36.6 C), temperature source Oral, resp. rate 16, weight 60.3 kg, SpO2 99 %. Body mass index is 23.55 kg/m.

## 2022-05-01 NOTE — Care Management (Signed)
IVC rescinded and faxed to [facility] [phone]. Original on the shadow chart.

## 2022-05-01 NOTE — Progress Notes (Addendum)
PROGRESS NOTE  MEAGHEN VECCHIARELLI HYQ:[id number] DOB: 1966/03/04 DOA: 04/27/2022 PCP: Heywood Bene, PA-C  HPI/Recap of past 24 hours: Amy Villa is a 56 y.o. with history of CAD, hypertension, polysubstance abuse presented with concern for an overdose after found to be unresponsive PTA. EMS was called, patient was given Narcan. On presentation she was hypoxic satting in the low 80s, tachycardic and normotensive. Urine drug screen negative for opiates. Urinalysis showed no concern for infection.  Chest x-ray showed emphysema without focal consolidations.  CT head showed no acute intracranial abnormality.  CT chest abdomen pelvis demonstrated no acute findings. Patient was admitted for further management.  She remained borderline hypoxic and was placed on Narcan drip. On exam patient had notable asterixis and denied drug use.     Patient denies any new complaints.  Patient now admits to using heroin with son leading to unresponsiveness.  Today IVC rescinded as per psych     Assessment/Plan: Principal Problem:   Sepsis ([place]) Active Problems:   AMS (altered mental status)   Drug overdose   Acute toxic/metabolic encephalopathy most likely due to hepatic encephalopathy and/or aspiration pneumonia, POA, active Alcohol withdrawal Encephalopathy improving today UDS negative-although husband reports possible heroin ingestion (son abuses drugs and came visiting shortly after patient was noted to be unresponsive) Management as noted below Psychiatry consulted-appreciate recs  Alcoholic cirrhosis Noted possible hepatic encephalopathy Alcohol withdrawal Elevated ammonia, now WNL INR 1.4 LFTs WNL CT abdomen showed nodular liver consistent with cirrhosis, no significant ascites Continue lactulose Daily CMP Advised to quit alcohol, patient willing  Likely aspiration pneumonia/severe sepsis POA Acute hypoxic respiratory failure- improved Possible undiagnosed COPD Last temp  spike on 04/29/2022, ??101.3, with resolved  leukocytosis Lactic acidosis, currently resolved Repeat chest x-ray showed new mild right upper lobe opacity likely due to atelectasis or early pneumonia BC x2 NGTD CT chest unremarkable, except for notable emphysema S/p vancomycin, Flagyl, cefepime switched to IV Unasyn Continue supplemental oxygen prn  Acute kidney injury Resolved  Hypokalemia/hypomagnesemia Replace as needed   CAD Hypertension Currently chest pain-free Not on any BP meds  GERD Continue PPI   History of depression/anxiety Start home Cymbalta, Atarax, Seroquel, trazodone Psychiatry consulted      Estimated body mass index is 23.55 kg/m as calculated from the following:   Height as of 03/05/22: '5\' 3"'$  (1.6 m).   Weight as of this encounter: 60.3 kg.     Code Status: Full  Family Communication: None at bedside  Disposition Plan: Status is: Inpatient Remains inpatient appropriate because: Level of care      Consultants: Psychiatry  Procedures: None  Antimicrobials: Unasyn  DVT prophylaxis: Lovenox   Objective: Vitals:   05/01/22 0300 05/01/22 0553 05/01/22 0735 05/01/22 1146  BP: (!) 151/78  (!) 148/83   Pulse: 65 67 88   Resp: '16  18 16  '$ Temp: 97.8 F (36.6 C)  98.5 F (36.9 C) 97.8 F (36.6 C)  TempSrc: Oral  Oral Oral  SpO2: 96%  99%   Weight:        Intake/Output Summary (Last 24 hours) at 05/01/2022 1706 Last data filed at 05/01/2022 1341 Gross per 24 hour  Intake 820 ml  Output --  Net 820 ml   Filed Weights   04/28/22 0800  Weight: 60.3 kg    Exam: General: NAD Cardiovascular: S1, S2 present Respiratory: Diminished breath sounds bilaterally Abdomen: Soft, nontender, nondistended, bowel sounds present Musculoskeletal: No bilateral pedal edema noted Skin: Normal Psychiatry: Normal  mood     Data Reviewed: CBC: Recent Labs  Lab 04/27/22 2220 04/27/22 2233 04/28/22 0312 04/29/22 0029 04/30/22 0244  05/01/22 0544  WBC 43.0*  --  32.5* 12.9* 7.6 8.3  NEUTROABS 39.1*  --   --   --  5.3 5.8  HGB 16.0* 18.4* 13.9 12.6 12.0 11.6*  HCT 49.5* 54.0* 42.5 39.6 36.9 32.9*  MCV 89.7  --  91.4 91.7 88.5 83.9  PLT 324  --  296 198 177 833   Basic Metabolic Panel: Recent Labs  Lab 04/27/22 2220 04/27/22 2233 04/28/22 0312 04/29/22 0029 04/30/22 0244 05/01/22 0544  NA 132* 134* 133* 137 141 141  K 5.8* 5.9* 5.3* 3.8 3.5 2.6*  CL 91* 93* 101 106 106 107  CO2 22  --  20* '22 26 24  '$ GLUCOSE 128* 125* 124* 147* 115* 143*  BUN '13 19 11 6 '$ <5* <5*  CREATININE 2.49* 2.40* 1.82* 1.09* 0.85 0.64  CALCIUM 9.5  --  8.6* 8.9 9.3 8.8*  MG  --   --   --   --   --  1.5*   GFR: Estimated Creatinine Clearance: 65.7 mL/min (by C-G formula based on SCr of 0.64 mg/dL). Liver Function Tests: Recent Labs  Lab 04/27/22 2220 04/28/22 0312 04/30/22 0244 05/01/22 0544  AST 38 '25 20 16  '$ ALT '22 18 13 12  '$ ALKPHOS 101 89 81 72  BILITOT 0.7 0.4 1.0 1.2  PROT 8.9* 7.5 6.4* 6.3*  ALBUMIN 5.0 4.1 3.3* 3.1*   No results for input(s): "LIPASE", "AMYLASE" in the last 168 hours. Recent Labs  Lab 04/28/22 0843 04/29/22 0954 04/30/22 0244  AMMONIA 42* 44* 17   Coagulation Profile: Recent Labs  Lab 05/01/22 0544  INR 1.4*   Cardiac Enzymes: Recent Labs  Lab 04/27/22 2220  CKTOTAL 89   BNP (last 3 results) No results for input(s): "PROBNP" in the last 8760 hours. HbA1C: No results for input(s): "HGBA1C" in the last 72 hours. CBG: No results for input(s): "GLUCAP" in the last 168 hours. Lipid Profile: No results for input(s): "CHOL", "HDL", "LDLCALC", "TRIG", "CHOLHDL", "LDLDIRECT" in the last 72 hours. Thyroid Function Tests: No results for input(s): "TSH", "T4TOTAL", "FREET4", "T3FREE", "THYROIDAB" in the last 72 hours. Anemia Panel: No results for input(s): "VITAMINB12", "FOLATE", "FERRITIN", "TIBC", "IRON", "RETICCTPCT" in the last 72 hours. Urine analysis:    Component Value Date/Time    COLORURINE STRAW (A) 04/30/2022 1810   APPEARANCEUR CLEAR 04/30/2022 1810   LABSPEC 1.002 (L) 04/30/2022 1810   PHURINE 7.0 04/30/2022 1810   GLUCOSEU NEGATIVE 04/30/2022 1810   GLUCOSEU NEGATIVE 07/22/2016 1204   HGBUR NEGATIVE 04/30/2022 1810   BILIRUBINUR NEGATIVE 04/30/2022 1810   KETONESUR NEGATIVE 04/30/2022 1810   PROTEINUR NEGATIVE 04/30/2022 1810   UROBILINOGEN 0.2 07/22/2016 1204   NITRITE NEGATIVE 04/30/2022 1810   LEUKOCYTESUR NEGATIVE 04/30/2022 1810   Sepsis Labs: '@LABRCNTIP'$ (procalcitonin:4,lacticidven:4)  ) Recent Results (from the past 240 hour(s))  Culture, blood (single)     Status: None (Preliminary result)   Collection Time: 04/27/22 11:15 PM   Specimen: BLOOD  Result Value Ref Range Status   Specimen Description BLOOD SITE NOT SPECIFIED  Final   Special Requests   Final    BOTTLES DRAWN AEROBIC AND ANAEROBIC Blood Culture adequate volume   Culture   Final    NO GROWTH 4 DAYS Performed at [facility], [place] [address], [place], [place] [address]    Report Status PENDING  Incomplete  Resp  Panel by RT-PCR (Flu A&B, Covid)     Status: None   Collection Time: 04/27/22 11:15 PM   Specimen: Nasal Swab  Result Value Ref Range Status   SARS Coronavirus 2 by RT PCR NEGATIVE NEGATIVE Final    Comment: (NOTE) SARS-CoV-2 target nucleic acids are NOT DETECTED.  The SARS-CoV-2 RNA is generally detectable in upper respiratory specimens during the acute phase of infection. The lowest concentration of SARS-CoV-2 viral copies this assay can detect is 138 copies/mL. A negative result does not preclude SARS-Cov-2 infection and should not be used as the sole basis for treatment or other patient management decisions. A negative result may occur with  improper specimen collection/handling, submission of specimen other than nasopharyngeal swab, presence of viral mutation(s) within the areas targeted by this assay, and inadequate number of viral copies(<138 copies/mL).  A negative result must be combined with clinical observations, patient history, and epidemiological information. The expected result is Negative.  Fact Sheet for Patients:  EntrepreneurPulse.com.au  Fact Sheet for Healthcare Providers:  IncredibleEmployment.be  This test is no t yet approved or cleared by the [place] FDA and  has been authorized for detection and/or diagnosis of SARS-CoV-2 by FDA under an Emergency Use Authorization (EUA). This EUA will remain  in effect (meaning this test can be used) for the duration of the COVID-19 declaration under Section 564(b)(1) of the Act, 21 U.S.C.section 360bbb-3(b)(1), unless the authorization is terminated  or revoked sooner.       Influenza A by PCR NEGATIVE NEGATIVE Final   Influenza B by PCR NEGATIVE NEGATIVE Final    Comment: (NOTE) The Xpert Xpress SARS-CoV-2/FLU/RSV plus assay is intended as an aid in the diagnosis of influenza from Nasopharyngeal swab specimens and should not be used as a sole basis for treatment. Nasal washings and aspirates are unacceptable for Xpert Xpress SARS-CoV-2/FLU/RSV testing.  Fact Sheet for Patients: EntrepreneurPulse.com.au  Fact Sheet for Healthcare Providers: IncredibleEmployment.be  This test is not yet approved or cleared by the [place] FDA and has been authorized for detection and/or diagnosis of SARS-CoV-2 by FDA under an Emergency Use Authorization (EUA). This EUA will remain in effect (meaning this test can be used) for the duration of the COVID-19 declaration under Section 564(b)(1) of the Act, 21 U.S.C. section 360bbb-3(b)(1), unless the authorization is terminated or revoked.  Performed at [facility], [place] [address], [place], [place] [address]   Urine Culture     Status: None (Preliminary result)   Collection Time: 04/30/22  8:15 AM   Specimen: Urine, Clean Catch  Result Value Ref Range  Status   Specimen Description URINE, CLEAN CATCH  Final   Special Requests NONE  Final   Culture   Final    NO GROWTH Performed at [facility], [place] [address] [place], [place] [address]    Report Status PENDING  Incomplete  Culture, blood (Routine X 2) w Reflex to ID Panel     Status: None (Preliminary result)   Collection Time: 04/30/22  8:29 AM   Specimen: BLOOD LEFT HAND  Result Value Ref Range Status   Specimen Description BLOOD LEFT HAND  Final   Special Requests   Final    BOTTLES DRAWN AEROBIC AND ANAEROBIC Blood Culture adequate volume   Culture   Final    NO GROWTH 1 DAY Performed at [facility], [place] [address] [place],  [address]    Report Status PENDING  Incomplete  Culture, blood (Routine  X 2) w Reflex to ID Panel     Status: None (Preliminary result)   Collection Time: 04/30/22  8:35 AM   Specimen: BLOOD  Result Value Ref Range Status   Specimen Description BLOOD RIGHT ANTECUBITAL  Final   Special Requests   Final    BOTTLES DRAWN AEROBIC AND ANAEROBIC Blood Culture adequate volume   Culture   Final    NO GROWTH 1 DAY Performed at [facility], [place] [address], [place], [place] [address]    Report Status PENDING  Incomplete      Studies: No results found.  Scheduled Meds:  DULoxetine  60 mg Oral Daily   enoxaparin (LOVENOX) injection  40 mg Subcutaneous Daily   folic acid  1 mg Oral Daily   gabapentin  300 mg Oral BID   hydrOXYzine  50 mg Oral TID   lactulose  30 g Oral TID   multivitamin with minerals  1 tablet Oral Daily   pantoprazole  40 mg Oral QAC breakfast   potassium chloride  40 mEq Oral TID   QUEtiapine  300 mg Oral QHS   thiamine  100 mg Oral Daily   traZODone  100 mg Oral QHS    Continuous Infusions:  ampicillin-sulbactam (UNASYN) IV 3 g (05/01/22 1506)     LOS: 3 days     Alma Friendly, MD [facility]  If 7PM-7AM, please contact night-coverage [email or web address] 05/01/2022, 5:06 PM

## 2022-05-01 NOTE — Progress Notes (Signed)
Mobility Specialist Progress Note:   05/01/22 1141  Mobility  Activity Ambulated independently in hallway  Level of Assistance Modified independent, requires aide device or extra time  Assistive Device Other (Comment) (IV Pole)  Distance Ambulated (ft) 350 ft  Activity Response Tolerated well  Mobility Referral Yes  $Mobility charge 1 Mobility   Pt received in bed and agreeable. No complaints. Pt left ambulating in room with all needs met, call bell in reach, and NT in room.   Milda Lindvall Mobility Specialist-Acute Rehab Secure Chat only

## 2022-05-02 DIAGNOSIS — T50904A Poisoning by unspecified drugs, medicaments and biological substances, undetermined, initial encounter: Secondary | ICD-10-CM | POA: Diagnosis not present

## 2022-05-02 DIAGNOSIS — R4182 Altered mental status, unspecified: Secondary | ICD-10-CM | POA: Diagnosis not present

## 2022-05-02 LAB — CBC WITH DIFFERENTIAL/PLATELET
Abs Immature Granulocytes: 0.04 10*3/uL (ref 0.00–0.07)
Basophils Absolute: 0 10*3/uL (ref 0.0–0.1)
Basophils Relative: 0 %
Eosinophils Absolute: 0.2 10*3/uL (ref 0.0–0.5)
Eosinophils Relative: 2 %
HCT: 33.7 % — ABNORMAL LOW (ref 36.0–46.0)
Hemoglobin: 11.3 g/dL — ABNORMAL LOW (ref 12.0–15.0)
Immature Granulocytes: 0 %
Lymphocytes Relative: 17 %
Lymphs Abs: 1.6 10*3/uL (ref 0.7–4.0)
MCH: 28.7 pg (ref 26.0–34.0)
MCHC: 33.5 g/dL (ref 30.0–36.0)
MCV: 85.5 fL (ref 80.0–100.0)
Monocytes Absolute: 0.8 10*3/uL (ref 0.1–1.0)
Monocytes Relative: 8 %
Neutro Abs: 7 10*3/uL (ref 1.7–7.7)
Neutrophils Relative %: 73 %
Platelets: 208 10*3/uL (ref 150–400)
RBC: 3.94 MIL/uL (ref 3.87–5.11)
RDW: 12.9 % (ref 11.5–15.5)
WBC: 9.7 10*3/uL (ref 4.0–10.5)
nRBC: 0 % (ref 0.0–0.2)

## 2022-05-02 LAB — COMPREHENSIVE METABOLIC PANEL
ALT: 11 U/L (ref 0–44)
AST: 14 U/L — ABNORMAL LOW (ref 15–41)
Albumin: 3.3 g/dL — ABNORMAL LOW (ref 3.5–5.0)
Alkaline Phosphatase: 69 U/L (ref 38–126)
Anion gap: 9 (ref 5–15)
BUN: <5 mg/dL — ABNORMAL LOW (ref 6–20)
CO2: 23 mmol/L (ref 22–32)
Calcium: 9.2 mg/dL (ref 8.9–10.3)
Chloride: 109 mmol/L (ref 98–111)
Creatinine, Ser: 0.7 mg/dL (ref 0.44–1.00)
GFR, Estimated: >60 mL/min (ref >60)
Glucose, Bld: 103 mg/dL — ABNORMAL HIGH (ref 70–99)
Potassium: 3.4 mmol/L — ABNORMAL LOW (ref 3.5–5.1)
Sodium: 141 mmol/L (ref 135–145)
Total Bilirubin: 0.5 mg/dL (ref 0.3–1.2)
Total Protein: 6.5 g/dL (ref 6.5–8.1)

## 2022-05-02 LAB — CULTURE, BLOOD (SINGLE)
Culture: NO GROWTH
Special Requests: ADEQUATE

## 2022-05-02 LAB — MAGNESIUM: Magnesium: 2 mg/dL (ref 1.7–2.4)

## 2022-05-02 MED ORDER — GABAPENTIN 300 MG PO CAPS: 300.0000 mg | ORAL_CAPSULE | Freq: Two times a day (BID) | ORAL | 0 refills | Status: AC

## 2022-05-02 MED ORDER — FOLIC ACID 1 MG PO TABS: 1.0000 mg | ORAL_TABLET | Freq: Every day | ORAL | Status: AC

## 2022-05-02 MED ORDER — POTASSIUM CHLORIDE CRYS ER 20 MEQ PO TBCR
40.0000 meq | EXTENDED_RELEASE_TABLET | Freq: Once | ORAL | Status: AC
  Administered 2022-05-02: 40 meq via ORAL
  Filled 2022-05-02: qty 2

## 2022-05-02 MED ORDER — AMOXICILLIN-POT CLAVULANATE 500-125 MG PO TABS: 1.0000 | ORAL_TABLET | Freq: Two times a day (BID) | ORAL | 0 refills | Status: AC

## 2022-05-02 NOTE — Discharge Summary (Signed)
Physician Discharge Summary   Patient: Amy Villa MRN: [id number] DOB: May 13, 1966  Admit date:     04/27/2022  Discharge date: 05/02/22  Discharge Physician: Alma Friendly   PCP: Heywood Bene, PA-C   Recommendations at discharge:   Follow-up with PCP in 1 week Follow-up with outpatient rehab for substance abuse   Discharge Diagnoses: Principal Problem:   Sepsis ([place]) Active Problems:   AMS (altered mental status)   Drug overdose    Hospital Course: Amy Villa is a 56 y.o. with history of CAD, hypertension, polysubstance abuse presented with concern for an overdose after found to be unresponsive PTA. EMS was called, patient was given Narcan. On presentation she was hypoxic satting in the low 80s, tachycardic and normotensive. Urine drug screen negative for opiates. Urinalysis showed no concern for infection.  Chest x-ray showed emphysema without focal consolidations.  CT head showed no acute intracranial abnormality.  CT chest abdomen pelvis demonstrated no acute findings. Patient was admitted for further management.  She remained borderline hypoxic and was placed on Narcan drip. On exam patient had notable asterixis and denied drug use.  Patient was further stabilized, and later admitted to snorting heroin prior to have being unresponsive.  Due to significant agitation and attempts to flee the hospital on 10/11, patient was IVCed.  On 05/01/2022, psychiatry evaluated patient and recommended rescinding the IVC.  Patient advised to follow-up with outpatient substance abuse rehab, PCP and psychiatry.    Assessment and Plan:  Acute toxic/metabolic encephalopathy most likely due to hepatic encephalopathy and/or aspiration pneumonia, POA, active Alcohol withdrawal Encephalopathy resolved UDS negative-although patient admitted to snorting heroin prior to being unresponsive Management as noted below Psychiatry consulted-appreciate recs   Alcoholic  cirrhosis Noted possible hepatic encephalopathy Alcohol withdrawal Elevated ammonia, now WNL INR 1.4 LFTs WNL CT abdomen showed nodular liver consistent with cirrhosis, no significant ascites Continue lactulose Advised to quit alcohol, patient willing Follow-up with GI   Likely aspiration pneumonia/severe sepsis POA Acute hypoxic respiratory failure-resolved Possible undiagnosed COPD Repeat chest x-ray showed new mild right upper lobe opacity likely due to atelectasis or early pneumonia BC x2 NGTD CT chest unremarkable, except for notable emphysema S/p vancomycin, Flagyl, cefepime switched to IV Unasyn, discharged on p.o. Augmentin to complete 7 days PCP follow-up   Acute kidney injury Resolved   Hypokalemia/hypomagnesemia Replaced as needed   CAD Hypertension Currently chest pain-free Not on any BP meds   GERD Continue PPI   History of depression/anxiety Restart home Cymbalta, Atarax, Seroquel, trazodone Psychiatry consulted        Pain control - [place] Controlled Substance Reporting System database was reviewed. and patient was instructed, not to drive, operate heavy machinery, perform activities at heights, swimming or participation in water activities or provide baby-sitting services while on Pain, Sleep and Anxiety Medications; until their outpatient Physician has advised to do so again. Also recommended to not to take more than prescribed Pain, Sleep and Anxiety Medications.    Consultants: Psychiatry Procedures performed: None Disposition: Home Diet recommendation:  Cardiac diet   DISCHARGE MEDICATION: Allergies as of 05/02/2022       Reactions   Hydroxyamine Other (See Comments)   Not effective   Lyrica [pregabalin] Other (See Comments)   Angry, upset         Medication List     STOP taking these medications    tiZANidine 4 MG tablet Commonly known as: ZANAFLEX       TAKE these medications  amoxicillin-clavulanate 500-125  MG tablet Commonly known as: Augmentin Take 1 tablet by mouth 2 (two) times daily for 2 days.   betamethasone dipropionate 0.05 % lotion Apply 1 Application topically as needed for itching.   DULoxetine 60 MG capsule Commonly known as: CYMBALTA Take 60 mg by mouth daily.   folic acid 1 MG tablet Commonly known as: FOLVITE Take 1 tablet (1 mg total) by mouth daily.   gabapentin 300 MG capsule Commonly known as: NEURONTIN Take 1 capsule (300 mg total) by mouth 2 (two) times daily. What changed:  how much to take when to take this   hydrOXYzine 50 MG tablet Commonly known as: ATARAX Take 50 mg by mouth 3 (three) times daily.   lactulose 10 GM/15ML solution Commonly known as: CHRONULAC TAKE 30ML BY MOUTH EVERY DAY What changed: See the new instructions.   naproxen sodium 220 MG tablet Commonly known as: ALEVE Take 220 mg by mouth 2 (two) times daily as needed (Headaches).   Oxcarbazepine 300 MG tablet Commonly known as: TRILEPTAL Take 300 mg by mouth 2 (two) times daily.   pantoprazole 40 MG tablet Commonly known as: PROTONIX TAKE 1 TABLET(40 MG) BY MOUTH DAILY BEFORE BREAKFAST What changed: See the new instructions.   QUEtiapine 50 MG tablet Commonly known as: SEROQUEL Take 1 tablet (50 mg total) by mouth at bedtime as needed (insomnia). What changed:  how much to take when to take this   traZODone 50 MG tablet Commonly known as: DESYREL Take 100 mg by mouth at bedtime.        Follow-up Information     Heywood Bene, PA-C. Schedule an appointment as soon as possible for a visit in 1 week(s).   Specialty: Physician Assistant Contact information: [address] [place] [id number] [phone]                Discharge Exam: [name] Weights   04/28/22 0800  Weight: 60.3 kg   General: NAD  Cardiovascular: S1, S2 present Respiratory: CTAB Abdomen: Soft, nontender, nondistended, bowel sounds present Musculoskeletal: No  bilateral pedal edema noted Skin: Normal Psychiatry: Normal mood   Condition at discharge: good  The results of significant diagnostics from this hospitalization (including imaging, microbiology, ancillary and laboratory) are listed below for reference.   Imaging Studies: DG CHEST PORT 1 VIEW  Result Date: 04/30/2022 CLINICAL DATA:  Shortness of breath EXAM: PORTABLE CHEST 1 VIEW COMPARISON:  Chest x-ray dated October 9th 2023; CT chest, abdomen and pelvis dated April 27, 2022 FINDINGS: The heart size and mediastinal contours are within normal limits. Small nodular opacity of the right upper lobe which correlates with pulmonary nodule seen on recent prior CT. Mild right upper lobe opacity. Mild bibasilar atelectasis. The visualized skeletal structures are unremarkable. IMPRESSION: 1. New mild right upper lobe opacity may be due to atelectasis or early pneumonia. 2. Mild bibasilar atelectasis. 3. Small nodular opacity of the right upper lobe which correlates with pulmonary nodule seen on recent prior CT. Recommend follow-up chest CT in 3 months to ensure resolution. Electronically Signed   By: Yetta Glassman M.D.   On: 04/30/2022 08:59   CT CHEST ABDOMEN PELVIS WO CONTRAST  Result Date: 04/27/2022 CLINICAL DATA:  Overdose, respiratory distress EXAM: CT CHEST, ABDOMEN AND PELVIS WITHOUT CONTRAST TECHNIQUE: Multidetector CT imaging of the chest, abdomen and pelvis was performed following the standard protocol without IV contrast. RADIATION DOSE REDUCTION: This exam was performed according to the departmental dose-optimization program which includes  automated exposure control, adjustment of the mA and/or kV according to patient size and/or use of iterative reconstruction technique. COMPARISON:  CT abdomen/pelvis dated 03/09/2022. CT chest dated 03/08/2022. FINDINGS: CT CHEST FINDINGS Cardiovascular: The heart is normal in size. Small pericardial effusion. No evidence of thoracic aortic aneurysm.  Mediastinum/Nodes: No suspicious mediastinal lymphadenopathy. Visualized thyroid is unremarkable. Lungs/Pleura: Moderate centrilobular and paraseptal emphysematous changes. Dependent atelectasis in the posterior upper and lower lobes. No suspicious pulmonary nodules. No focal consolidation. No pleural effusion or pneumothorax. Musculoskeletal: Old bilateral posterior 9th through 11th rib fractures, including a nonunited left posterior 11th rib fracture (series 3/image 52). Thoracic spine is within normal limits. CT ABDOMEN PELVIS FINDINGS Hepatobiliary: Nodular hepatic contour, reflecting cirrhosis. Gallbladder is unremarkable. No intrahepatic or extrahepatic ductal dilatation. Pancreas: Within normal limits. Spleen: Within normal limits. Adrenals/Urinary Tract: Adrenal glands are within normal limits. Kidneys are within normal limits. No renal, ureteral, or bladder calculi. No hydronephrosis. Bladder is within normal limits. Stomach/Bowel: Stomach is within normal limits. No evidence of bowel obstruction. Normal appendix (series 3/image 96). No colonic wall thickening or inflammatory changes. Vascular/Lymphatic: No evidence of abdominal aortic aneurysm. Atherosclerotic calcifications of the abdominal aorta and branch vessels. No suspicious abdominopelvic lymphadenopathy. Reproductive: Status post hysterectomy. Bilateral ovaries are within normal limits. Other: No abdominopelvic ascites. Musculoskeletal: Mild degenerative changes of the lumbar spine. IMPRESSION: No acute findings in the abdomen/pelvis. Small pericardial effusion. Cirrhosis.  No abdominopelvic ascites. Aortic Atherosclerosis (ICD10-I70.0) and Emphysema (ICD10-J43.9). Electronically Signed   By: Julian Hy M.D.   On: 04/27/2022 23:14   CT HEAD WO CONTRAST (5MM)  Result Date: 04/27/2022 CLINICAL DATA:  Altered mental status EXAM: CT HEAD WITHOUT CONTRAST TECHNIQUE: Contiguous axial images were obtained from the base of the skull through the  vertex without intravenous contrast. RADIATION DOSE REDUCTION: This exam was performed according to the departmental dose-optimization program which includes automated exposure control, adjustment of the mA and/or kV according to patient size and/or use of iterative reconstruction technique. COMPARISON:  MRI brain dated 03/05/2022 FINDINGS: Brain: No evidence of acute infarction, hemorrhage, hydrocephalus, extra-axial collection or mass lesion/mass effect. Vascular: No hyperdense vessel or unexpected calcification. Skull: Normal. Negative for fracture or focal lesion. Sinuses/Orbits: The visualized paranasal sinuses are essentially clear. The mastoid air cells are unopacified. Other: None. IMPRESSION: Normal head CT. Electronically Signed   By: Julian Hy M.D.   On: 04/27/2022 22:59   DG Chest Port 1 View  Result Date: 04/27/2022 CLINICAL DATA:  Hypoxemia. EXAM: PORTABLE CHEST 1 VIEW COMPARISON:  CT chest dated April 17, 2022 FINDINGS: The heart size and mediastinal contours are within normal limits. Emphysematous changes of lung apices. No focal consolidation or pleural effusion. Low lung volumes with elevation of bilateral hemidiaphragms. The visualized skeletal structures are unremarkable. IMPRESSION: 1. Emphysema. 2. No focal consolidation or pleural effusion. Electronically Signed   By: Keane Police D.O.   On: 04/27/2022 22:33    Microbiology: Results for orders placed or performed during the hospital encounter of 04/27/22  Culture, blood (single)     Status: None   Collection Time: 04/27/22 11:15 PM   Specimen: BLOOD  Result Value Ref Range Status   Specimen Description BLOOD SITE NOT SPECIFIED  Final   Special Requests   Final    BOTTLES DRAWN AEROBIC AND ANAEROBIC Blood Culture adequate volume   Culture   Final    NO GROWTH 5 DAYS Performed at [facility], [address], [place], [place] [address]  Report Status 05/02/2022 FINAL  Final  Resp Panel by RT-PCR (Flu A&B,  Covid)     Status: None   Collection Time: 04/27/22 11:15 PM   Specimen: Nasal Swab  Result Value Ref Range Status   SARS Coronavirus 2 by RT PCR NEGATIVE NEGATIVE Final    Comment: (NOTE) SARS-CoV-2 target nucleic acids are NOT DETECTED.  The SARS-CoV-2 RNA is generally detectable in upper respiratory specimens during the acute phase of infection. The lowest concentration of SARS-CoV-2 viral copies this assay can detect is 138 copies/mL. A negative result does not preclude SARS-Cov-2 infection and should not be used as the sole basis for treatment or other patient management decisions. A negative result may occur with  improper specimen collection/handling, submission of specimen other than nasopharyngeal swab, presence of viral mutation(s) within the areas targeted by this assay, and inadequate number of viral copies(<138 copies/mL). A negative result must be combined with clinical observations, patient history, and epidemiological information. The expected result is Negative.  Fact Sheet for Patients:  EntrepreneurPulse.com.au  Fact Sheet for Healthcare Providers:  IncredibleEmployment.be  This test is no t yet approved or cleared by the [place] FDA and  has been authorized for detection and/or diagnosis of SARS-CoV-2 by FDA under an Emergency Use Authorization (EUA). This EUA will remain  in effect (meaning this test can be used) for the duration of the COVID-19 declaration under Section 564(b)(1) of the Act, 21 U.S.C.section 360bbb-3(b)(1), unless the authorization is terminated  or revoked sooner.       Influenza A by PCR NEGATIVE NEGATIVE Final   Influenza B by PCR NEGATIVE NEGATIVE Final    Comment: (NOTE) The Xpert Xpress SARS-CoV-2/FLU/RSV plus assay is intended as an aid in the diagnosis of influenza from Nasopharyngeal swab specimens and should not be used as a sole basis for treatment. Nasal washings and aspirates are  unacceptable for Xpert Xpress SARS-CoV-2/FLU/RSV testing.  Fact Sheet for Patients: EntrepreneurPulse.com.au  Fact Sheet for Healthcare Providers: IncredibleEmployment.be  This test is not yet approved or cleared by the [place] FDA and has been authorized for detection and/or diagnosis of SARS-CoV-2 by FDA under an Emergency Use Authorization (EUA). This EUA will remain in effect (meaning this test can be used) for the duration of the COVID-19 declaration under Section 564(b)(1) of the Act, 21 U.S.C. section 360bbb-3(b)(1), unless the authorization is terminated or revoked.  Performed at [facility], [place] [address], [place], [place] [address]   Urine Culture     Status: None (Preliminary result)   Collection Time: 04/30/22  8:15 AM   Specimen: Urine, Clean Catch  Result Value Ref Range Status   Specimen Description URINE, CLEAN CATCH  Final   Special Requests NONE  Final   Culture   Final    NO GROWTH Performed at [facility], [place] [address], [place], [place] [address]    Report Status PENDING  Incomplete  Culture, blood (Routine X 2) w Reflex to ID Panel     Status: None (Preliminary result)   Collection Time: 04/30/22  8:29 AM   Specimen: BLOOD LEFT HAND  Result Value Ref Range Status   Specimen Description BLOOD LEFT HAND  Final   Special Requests   Final    BOTTLES DRAWN AEROBIC AND ANAEROBIC Blood Culture adequate volume   Culture   Final    NO GROWTH 2 DAYS Performed at [facility], [place] [address] [place], [place] [address]    Report  Status PENDING  Incomplete  Culture, blood (Routine X 2) w Reflex to ID Panel     Status: None (Preliminary result)   Collection Time: 04/30/22  8:35 AM   Specimen: BLOOD  Result Value Ref Range Status   Specimen Description BLOOD RIGHT ANTECUBITAL  Final   Special Requests   Final    BOTTLES DRAWN AEROBIC AND ANAEROBIC Blood Culture adequate volume   Culture   Final     NO GROWTH 2 DAYS Performed at [facility], [address], [place], [place] [address]    Report Status PENDING  Incomplete    Labs: CBC: Recent Labs  Lab 04/27/22 2220 04/27/22 2233 04/28/22 0312 04/29/22 0029 04/30/22 0244 05/01/22 0544 05/02/22 0444  WBC 43.0*  --  32.5* 12.9* 7.6 8.3 9.7  NEUTROABS 39.1*  --   --   --  5.3 5.8 7.0  HGB 16.0*   < > 13.9 12.6 12.0 11.6* 11.3*  HCT 49.5*   < > 42.5 39.6 36.9 32.9* 33.7*  MCV 89.7  --  91.4 91.7 88.5 83.9 85.5  PLT 324  --  296 198 177 177 208   < > = values in this interval not displayed.   Basic Metabolic Panel: Recent Labs  Lab 04/28/22 0312 04/29/22 0029 04/30/22 0244 05/01/22 0544 05/02/22 0444  NA 133* 137 141 141 141  K 5.3* 3.8 3.5 2.6* 3.4*  CL 101 106 106 107 109  CO2 20* '22 26 24 23  '$ GLUCOSE 124* 147* 115* 143* 103*  BUN 11 6 <5* <5* <5*  CREATININE 1.82* 1.09* 0.85 0.64 0.70  CALCIUM 8.6* 8.9 9.3 8.8* 9.2  MG  --   --   --  1.5* 2.0   Liver Function Tests: Recent Labs  Lab 04/27/22 2220 04/28/22 0312 04/30/22 0244 05/01/22 0544 05/02/22 0444  AST 38 '25 20 16 '$ 14*  ALT '22 18 13 12 11  '$ ALKPHOS 101 89 81 72 69  BILITOT 0.7 0.4 1.0 1.2 0.5  PROT 8.9* 7.5 6.4* 6.3* 6.5  ALBUMIN 5.0 4.1 3.3* 3.1* 3.3*   CBG: No results for input(s): "GLUCAP" in the last 168 hours.  Discharge time spent: greater than 30 minutes.  Signed: Alma Friendly, MD [facility] 05/02/2022

## 2022-05-02 NOTE — Plan of Care (Signed)

## 2022-05-05 LAB — CULTURE, BLOOD (ROUTINE X 2)
Culture: NO GROWTH
Culture: NO GROWTH
Special Requests: ADEQUATE
Special Requests: ADEQUATE

## 2022-05-06 LAB — URINE CULTURE: Culture: NO GROWTH

## 2022-05-21 ENCOUNTER — Ambulatory Visit: Payer: BLUE CROSS/BLUE SHIELD | Admitting: Neurology

## 2022-11-03 ENCOUNTER — Encounter: Payer: Self-pay | Admitting: Internal Medicine

## 2023-05-03 ENCOUNTER — Inpatient Hospital Stay (HOSPITAL_COMMUNITY)
Admission: EM | Admit: 2023-05-03 | Discharge: 2023-05-06 | DRG: 190 | Disposition: A | Discharge status: Home / Self Care | Payer: BLUE CROSS/BLUE SHIELD | Attending: Internal Medicine | Admitting: Internal Medicine

## 2023-05-03 ENCOUNTER — Emergency Department (HOSPITAL_COMMUNITY): Payer: BLUE CROSS/BLUE SHIELD

## 2023-05-03 ENCOUNTER — Encounter (HOSPITAL_COMMUNITY): Payer: Self-pay

## 2023-05-03 ENCOUNTER — Other Ambulatory Visit: Payer: Self-pay

## 2023-05-03 DIAGNOSIS — G4700 Insomnia, unspecified: Secondary | ICD-10-CM | POA: Diagnosis present

## 2023-05-03 DIAGNOSIS — F603 Borderline personality disorder: Secondary | ICD-10-CM | POA: Diagnosis present

## 2023-05-03 DIAGNOSIS — Z87891 Personal history of nicotine dependence: Secondary | ICD-10-CM

## 2023-05-03 DIAGNOSIS — J9601 Acute respiratory failure with hypoxia: Secondary | ICD-10-CM | POA: Diagnosis present

## 2023-05-03 DIAGNOSIS — Z79899 Other long term (current) drug therapy: Secondary | ICD-10-CM

## 2023-05-03 DIAGNOSIS — G4750 Parasomnia, unspecified: Secondary | ICD-10-CM | POA: Diagnosis present

## 2023-05-03 DIAGNOSIS — J441 Chronic obstructive pulmonary disease with (acute) exacerbation: Secondary | ICD-10-CM | POA: Diagnosis not present

## 2023-05-03 DIAGNOSIS — F32A Depression, unspecified: Secondary | ICD-10-CM | POA: Diagnosis present

## 2023-05-03 DIAGNOSIS — F329 Major depressive disorder, single episode, unspecified: Secondary | ICD-10-CM | POA: Diagnosis present

## 2023-05-03 DIAGNOSIS — I10 Essential (primary) hypertension: Secondary | ICD-10-CM | POA: Diagnosis present

## 2023-05-03 DIAGNOSIS — Z888 Allergy status to other drugs, medicaments and biological substances status: Secondary | ICD-10-CM

## 2023-05-03 DIAGNOSIS — G629 Polyneuropathy, unspecified: Secondary | ICD-10-CM | POA: Diagnosis present

## 2023-05-03 DIAGNOSIS — Z981 Arthrodesis status: Secondary | ICD-10-CM

## 2023-05-03 DIAGNOSIS — K7030 Alcoholic cirrhosis of liver without ascites: Secondary | ICD-10-CM | POA: Diagnosis present

## 2023-05-03 DIAGNOSIS — D721 Eosinophilia, unspecified: Secondary | ICD-10-CM | POA: Diagnosis present

## 2023-05-03 DIAGNOSIS — Z1152 Encounter for screening for COVID-19: Secondary | ICD-10-CM

## 2023-05-03 DIAGNOSIS — K7460 Unspecified cirrhosis of liver: Secondary | ICD-10-CM | POA: Diagnosis present

## 2023-05-03 DIAGNOSIS — E876 Hypokalemia: Secondary | ICD-10-CM | POA: Diagnosis present

## 2023-05-03 DIAGNOSIS — F419 Anxiety disorder, unspecified: Secondary | ICD-10-CM | POA: Diagnosis present

## 2023-05-03 DIAGNOSIS — I700 Atherosclerosis of aorta: Secondary | ICD-10-CM | POA: Diagnosis present

## 2023-05-03 DIAGNOSIS — F411 Generalized anxiety disorder: Secondary | ICD-10-CM | POA: Diagnosis present

## 2023-05-03 LAB — BASIC METABOLIC PANEL
Anion gap: 13 (ref 5–15)
BUN: 6 mg/dL (ref 6–20)
CO2: 25 mmol/L (ref 22–32)
Calcium: 9.3 mg/dL (ref 8.9–10.3)
Chloride: 100 mmol/L (ref 98–111)
Creatinine, Ser: 0.9 mg/dL (ref 0.44–1.00)
GFR, Estimated: >60 mL/min (ref >60)
Glucose, Bld: 100 mg/dL — ABNORMAL HIGH (ref 70–99)
Potassium: 4.2 mmol/L (ref 3.5–5.1)
Sodium: 138 mmol/L (ref 135–145)

## 2023-05-03 LAB — CBC WITH DIFFERENTIAL/PLATELET
Abs Immature Granulocytes: 0.02 10*3/uL (ref 0.00–0.07)
Basophils Absolute: 0.1 10*3/uL (ref 0.0–0.1)
Basophils Relative: 1 %
Eosinophils Absolute: 0.6 10*3/uL — ABNORMAL HIGH (ref 0.0–0.5)
Eosinophils Relative: 9 %
HCT: 41.6 % (ref 36.0–46.0)
Hemoglobin: 13.9 g/dL (ref 12.0–15.0)
Immature Granulocytes: 0 %
Lymphocytes Relative: 20 %
Lymphs Abs: 1.3 10*3/uL (ref 0.7–4.0)
MCH: 31.5 pg (ref 26.0–34.0)
MCHC: 33.4 g/dL (ref 30.0–36.0)
MCV: 94.3 fL (ref 80.0–100.0)
Monocytes Absolute: 0.3 10*3/uL (ref 0.1–1.0)
Monocytes Relative: 5 %
Neutro Abs: 4.1 10*3/uL (ref 1.7–7.7)
Neutrophils Relative %: 65 %
Platelets: 179 10*3/uL (ref 150–400)
RBC: 4.41 MIL/uL (ref 3.87–5.11)
RDW: 13.6 % (ref 11.5–15.5)
WBC: 6.3 10*3/uL (ref 4.0–10.5)
nRBC: 0 % (ref 0.0–0.2)

## 2023-05-03 LAB — RESP PANEL BY RT-PCR (RSV, FLU A&B, COVID)  RVPGX2
Influenza A by PCR: NEGATIVE
Influenza B by PCR: NEGATIVE
Resp Syncytial Virus by PCR: NEGATIVE
SARS Coronavirus 2 by RT PCR: NEGATIVE

## 2023-05-03 MED ORDER — SODIUM CHLORIDE 0.9% FLUSH: 3.0000 mL | INTRAVENOUS | Status: DC | PRN

## 2023-05-03 MED ORDER — IPRATROPIUM-ALBUTEROL 0.5-2.5 (3) MG/3ML IN SOLN
3.0000 mL | Freq: Once | RESPIRATORY_TRACT | Status: AC
  Administered 2023-05-03: 3 mL via RESPIRATORY_TRACT
  Filled 2023-05-03: qty 3

## 2023-05-03 MED ORDER — REVEFENACIN 175 MCG/3ML IN SOLN
175.0000 ug | Freq: Every day | RESPIRATORY_TRACT | Status: DC
  Administered 2023-05-03 – 2023-05-06 (×3): 175 ug via RESPIRATORY_TRACT
  Filled 2023-05-03 (×3): qty 3

## 2023-05-03 MED ORDER — IPRATROPIUM-ALBUTEROL 0.5-2.5 (3) MG/3ML IN SOLN
3.0000 mL | RESPIRATORY_TRACT | Status: DC
  Administered 2023-05-04 (×3): 3 mL via RESPIRATORY_TRACT
  Filled 2023-05-03 (×3): qty 3

## 2023-05-03 MED ORDER — ENOXAPARIN SODIUM 40 MG/0.4ML IJ SOSY
40.0000 mg | PREFILLED_SYRINGE | INTRAMUSCULAR | Status: DC
  Administered 2023-05-04 – 2023-05-06 (×3): 40 mg via SUBCUTANEOUS
  Filled 2023-05-03 (×3): qty 0.4

## 2023-05-03 MED ORDER — QUETIAPINE FUMARATE 100 MG PO TABS
300.0000 mg | ORAL_TABLET | Freq: Every day | ORAL | Status: DC
  Administered 2023-05-04: 300 mg via ORAL
  Filled 2023-05-03: qty 3

## 2023-05-03 MED ORDER — OXCARBAZEPINE 300 MG PO TABS
300.0000 mg | ORAL_TABLET | Freq: Two times a day (BID) | ORAL | Status: DC
  Administered 2023-05-04 (×2): 300 mg via ORAL
  Filled 2023-05-03 (×2): qty 1

## 2023-05-03 MED ORDER — PANTOPRAZOLE SODIUM 40 MG PO TBEC
40.0000 mg | DELAYED_RELEASE_TABLET | Freq: Every day | ORAL | Status: DC
  Administered 2023-05-04 – 2023-05-06 (×3): 40 mg via ORAL
  Filled 2023-05-03 (×3): qty 1

## 2023-05-03 MED ORDER — DULOXETINE HCL 30 MG PO CPEP
30.0000 mg | ORAL_CAPSULE | Freq: Every day | ORAL | Status: DC
  Administered 2023-05-04: 30 mg via ORAL
  Filled 2023-05-03: qty 1

## 2023-05-03 MED ORDER — GABAPENTIN 300 MG PO CAPS
300.0000 mg | ORAL_CAPSULE | Freq: Three times a day (TID) | ORAL | Status: DC
  Administered 2023-05-03 – 2023-05-04 (×2): 300 mg via ORAL
  Filled 2023-05-03 (×2): qty 1

## 2023-05-03 MED ORDER — ALBUTEROL SULFATE (2.5 MG/3ML) 0.083% IN NEBU
2.5000 mg | INHALATION_SOLUTION | Freq: Once | RESPIRATORY_TRACT | Status: AC
  Administered 2023-05-03: 2.5 mg via RESPIRATORY_TRACT
  Filled 2023-05-03: qty 3

## 2023-05-03 MED ORDER — LACTULOSE 10 GM/15ML PO SOLN: 20.0000 g | Freq: Every day | ORAL | Status: DC | PRN

## 2023-05-03 MED ORDER — HYDROXYZINE HCL 25 MG PO TABS
25.0000 mg | ORAL_TABLET | Freq: Three times a day (TID) | ORAL | Status: DC
  Administered 2023-05-03 – 2023-05-04 (×2): 25 mg via ORAL
  Filled 2023-05-03 (×2): qty 1

## 2023-05-03 MED ORDER — TRAZODONE HCL 50 MG PO TABS
100.0000 mg | ORAL_TABLET | Freq: Every day | ORAL | Status: DC
  Administered 2023-05-03 – 2023-05-05 (×3): 100 mg via ORAL
  Filled 2023-05-03 (×3): qty 2

## 2023-05-03 MED ORDER — PROPRANOLOL HCL 10 MG PO TABS
10.0000 mg | ORAL_TABLET | Freq: Two times a day (BID) | ORAL | Status: DC
  Administered 2023-05-04 (×2): 10 mg via ORAL
  Filled 2023-05-03 (×2): qty 1

## 2023-05-03 MED ORDER — ARFORMOTEROL TARTRATE 15 MCG/2ML IN NEBU
15.0000 ug | INHALATION_SOLUTION | Freq: Two times a day (BID) | RESPIRATORY_TRACT | Status: DC
  Administered 2023-05-04 – 2023-05-06 (×5): 15 ug via RESPIRATORY_TRACT
  Filled 2023-05-03 (×5): qty 2

## 2023-05-03 MED ORDER — AZITHROMYCIN 500 MG PO TABS
250.0000 mg | ORAL_TABLET | Freq: Every day | ORAL | Status: DC
  Administered 2023-05-04: 250 mg via ORAL
  Filled 2023-05-03: qty 1

## 2023-05-03 MED ORDER — BUDESONIDE 0.25 MG/2ML IN SUSP
0.2500 mg | Freq: Two times a day (BID) | RESPIRATORY_TRACT | Status: DC
  Administered 2023-05-04 – 2023-05-06 (×5): 0.25 mg via RESPIRATORY_TRACT
  Filled 2023-05-03 (×5): qty 2

## 2023-05-03 MED ORDER — METHYLPREDNISOLONE SODIUM SUCC 125 MG IJ SOLR
125.0000 mg | Freq: Once | INTRAMUSCULAR | Status: AC
  Administered 2023-05-03: 125 mg via INTRAVENOUS
  Filled 2023-05-03: qty 2

## 2023-05-03 MED ORDER — PREDNISONE 20 MG PO TABS
40.0000 mg | ORAL_TABLET | Freq: Every day | ORAL | Status: DC
  Administered 2023-05-04 – 2023-05-06 (×3): 40 mg via ORAL
  Filled 2023-05-03 (×3): qty 2

## 2023-05-03 NOTE — ED Triage Notes (Signed)
Pt present to ED from home with c/o shortness of breath worsening the past two days. Pt denies chest pain at this time. Pt received breathing treatments per EMS. Pt A&Ox3 at this time.

## 2023-05-03 NOTE — ED Provider Notes (Signed)
[place] EMERGENCY DEPARTMENT AT [facility] Provider Note   CSN: [id number] Arrival date & time: 05/03/23  1526     History  Chief Complaint  Patient presents with   Shortness of Breath    Amy Villa is a 57 y.o. female.  HPI 57 year old female presents with shortness of breath.  Came by EMS.  She has been having cough, chest congestion, and shortness of breath for about 2 days.  Is having some green sputum.  No fevers or chest pain.  No leg swelling.  Feels a little better after being given albuterol.  She is currently on 5 L and states she is not on any home oxygen but this is what EMS placed her on.  Home Medications Prior to Admission medications   Medication Sig Start Date End Date Taking? Authorizing Provider  betamethasone dipropionate 0.05 % lotion Apply 1 Application topically as needed for itching. 12/26/21  Yes [provider]  DULoxetine (CYMBALTA) 30 MG capsule Take 30 mg by mouth daily.   Yes [provider]  folic acid (FOLVITE) 1 MG tablet Take 1 tablet (1 mg total) by mouth daily. 05/02/22  Yes Briant Cedar, MD  gabapentin (NEURONTIN) 300 MG capsule Take 1 capsule (300 mg total) by mouth 2 (two) times daily. Patient taking differently: Take 300 mg by mouth 3 (three) times daily. 05/02/22 05/03/23 Yes Briant Cedar, MD  hydrOXYzine (ATARAX) 25 MG tablet Take 25 mg by mouth 3 (three) times daily.   Yes [provider]  lactulose (CHRONULAC) 10 GM/15ML solution TAKE BY MOUTH EVERY DAY Patient taking differently: Take 20 g by mouth daily as needed (constipation). 06/10/18  Yes Iva Boop, MD  naproxen sodium (ALEVE) 220 MG tablet Take 220 mg by mouth 2 (two) times daily as needed (Headaches).   Yes [provider]  Oxcarbazepine (TRILEPTAL) 300 MG tablet Take 300 mg by mouth 2 (two) times daily. 02/04/22  Yes [provider]  pantoprazole (PROTONIX) 40 MG tablet TAKE 1 TABLET(40 MG)  BY MOUTH DAILY BEFORE BREAKFAST Patient taking differently: Take 40 mg by mouth daily before breakfast. 08/15/18  Yes Iva Boop, MD  propranolol (INDERAL) 10 MG tablet Take 10 mg by mouth 2 (two) times daily.   Yes [provider]  QUEtiapine (SEROQUEL) 200 MG tablet Take 400 mg by mouth at bedtime.   Yes [provider]  traZODone (DESYREL) 100 MG tablet Take 100 mg by mouth at bedtime.   Yes [provider]  QUEtiapine (SEROQUEL) 50 MG tablet Take 1 tablet (50 mg total) by mouth at bedtime as needed (insomnia). Patient not taking: Reported on 05/03/2023 01/13/22   Maretta Bees, MD      Allergies    Hydroxyamine and Lyrica [pregabalin]    Review of Systems   Review of Systems  Constitutional:  Negative for fever.  HENT:  Positive for congestion.   Respiratory:  Positive for cough and shortness of breath.   Cardiovascular:  Negative for chest pain and leg swelling.    Physical Exam Updated Vital Signs BP 108/74   Pulse 65   Temp 98.1 F (36.7 C) (Oral)   Resp 13   Ht 5\' 3"  (1.6 m)   Wt 60 kg   SpO2 91%   BMI 23.43 kg/m  Physical Exam Vitals and nursing note reviewed.  Constitutional:      General: She is not in acute distress.    Appearance: She is well-developed.  She is not ill-appearing or diaphoretic.  HENT:     Head: Normocephalic and atraumatic.  Cardiovascular:     Rate and Rhythm: Normal rate and regular rhythm.     Heart sounds: Normal heart sounds.  Pulmonary:     Effort: Pulmonary effort is normal. No accessory muscle usage or respiratory distress.     Breath sounds: Wheezing (diffuse, expiratory) present.  Abdominal:     Palpations: Abdomen is soft.     Tenderness: There is no abdominal tenderness.  Musculoskeletal:     Right lower leg: No edema.     Left lower leg: No edema.  Skin:    General: Skin is warm and dry.  Neurological:     Mental Status: She is alert.     ED Results / Procedures / Treatments    Labs (all labs ordered are listed, but only abnormal results are displayed) Labs Reviewed  BASIC METABOLIC PANEL - Abnormal; Notable for the following components:      Result Value   Glucose, Bld 100 (*)    All other components within normal limits  CBC WITH DIFFERENTIAL/PLATELET - Abnormal; Notable for the following components:   Eosinophils Absolute 0.6 (*)    All other components within normal limits  RESP PANEL BY RT-PCR (RSV, FLU A&B, COVID)  RVPGX2  HIV ANTIBODY (ROUTINE TESTING W REFLEX)  CBC  BASIC METABOLIC PANEL    EKG EKG Interpretation Date/Time:  Monday May 03 2023 18:08:47 EDT Ventricular Rate:  60 PR Interval:  156 QRS Duration:  83 QT Interval:  445 QTC Calculation: 445 R Axis:   76  Text Interpretation: Sinus rhythm similar to earlier in the day Confirmed by Pricilla Loveless [phone]) on 05/03/2023 8:14:17 PM  Radiology DG Chest 2 View  Result Date: 05/03/2023 CLINICAL DATA:  Cough EXAM: CHEST - 2 VIEW COMPARISON:  04/30/2022 FINDINGS: Multiple cardiac leads overlie the chest. The heart size and mediastinal contours are within normal limits. No focal airspace consolidation, pleural effusion, or pneumothorax. Chronic sternal fracture. IMPRESSION: No active cardiopulmonary disease. Electronically Signed   By: Duanne Guess D.O.   On: 05/03/2023 19:41    Procedures Procedures    Medications Ordered in ED Medications  enoxaparin (LOVENOX) injection 40 mg (has no administration in time range)  sodium chloride flush (NS) 0.9 % injection 3 mL (has no administration in time range)  revefenacin (YUPELRI) nebulizer solution 175 mcg (175 mcg Nebulization Given 05/03/23 2327)  budesonide (PULMICORT) nebulizer solution 0.25 mg (has no administration in time range)  arformoterol (BROVANA) nebulizer solution 15 mcg (has no administration in time range)  ipratropium-albuterol (DUONEB) 0.5-2.5 (3) MG/3ML nebulizer solution 3 mL (has no administration in time range)   predniSONE (DELTASONE) tablet 40 mg (has no administration in time range)  azithromycin (ZITHROMAX) tablet 250 mg (has no administration in time range)  DULoxetine (CYMBALTA) DR capsule 30 mg (has no administration in time range)  gabapentin (NEURONTIN) capsule 300 mg (300 mg Oral Given 05/03/23 2326)  hydrOXYzine (ATARAX) tablet 25 mg (25 mg Oral Given 05/03/23 2327)  lactulose (CHRONULAC) 10 GM/15ML solution 20 g (has no administration in time range)  Oxcarbazepine (TRILEPTAL) tablet 300 mg (has no administration in time range)  pantoprazole (PROTONIX) EC tablet 40 mg (has no administration in time range)  propranolol (INDERAL) tablet 10 mg (has no administration in time range)  traZODone (DESYREL) tablet 100 mg (100 mg Oral Given 05/03/23 2327)  QUEtiapine (SEROQUEL) tablet 300 mg (has no administration in time range)  ipratropium-albuterol (DUONEB) 0.5-2.5 (3) MG/3ML nebulizer solution 3 mL (3 mLs Nebulization Given 05/03/23 1731)  albuterol (PROVENTIL) (2.5 MG/3ML) 0.083% nebulizer solution 2.5 mg (2.5 mg Nebulization Given 05/03/23 1731)  methylPREDNISolone sodium succinate (SOLU-MEDROL) 125 mg/2 mL injection 125 mg (125 mg Intravenous Given 05/03/23 1732)  ipratropium-albuterol (DUONEB) 0.5-2.5 (3) MG/3ML nebulizer solution 3 mL (3 mLs Nebulization Given 05/03/23 1943)  albuterol (PROVENTIL) (2.5 MG/3ML) 0.083% nebulizer solution 2.5 mg (2.5 mg Nebulization Given 05/03/23 1943)    ED Course/ Medical Decision Making/ A&P                                 Medical Decision Making Amount and/or Complexity of Data Reviewed Labs: ordered.    Details: COVID and flu negative Radiology: ordered and independent interpretation performed.    Details: No pneumonia ECG/medicine tests: ordered and independent interpretation performed.    Details: No ischemia  Risk Prescription drug management. Decision regarding hospitalization.   Patient presents with what seems like a COPD exacerbation.   No overt pneumonia.  Doing better but still wheezy and short of breath and now requiring a couple liters of oxygen after multiple breathing treatments.  Given Solu-Medrol.  She is not in distress and her work of breathing is improving but I think she will need admission for further pulmonary care.  Discussed with internal medicine teaching service.        Final Clinical Impression(s) / ED Diagnoses Final diagnoses:  COPD exacerbation (HCC)  Acute respiratory failure with hypoxia [facility])    Rx / DC Orders ED Discharge Orders     None         Pricilla Loveless, MD 05/03/23 2335

## 2023-05-03 NOTE — ED Notes (Signed)
Patient transport paged for patient to be taken to the floor

## 2023-05-03 NOTE — H&P (Signed)
Date: 05/03/2023               Patient Name:  Amy Villa MRN: [id number]  DOB: 1966/06/14 Age / Sex: 58 y.o., female   PCP: Roger Kill, PA-C         Medical Service: Internal Medicine Teaching Service         Attending Physician: Dr. Pricilla Loveless, MD      First Contact: Dr. Monna Fam, MD Pager [phone]    Second Contact: Dr. Marrianne Mood, MD Pager [phone]         After Hours (After 5p/  First Contact Pager: [phone]  weekends / holidays): Second Contact Pager: [phone]   SUBJECTIVE   Chief Complaint: shortness of breath  History of Present Illness:  Patient is a 57 year old woman with past medical history significant for COPD not on home oxygen, cirrhosis, presenting with shortness of breath. Patient reports worsening dyspnea on exertion and wheezing over the past six weeks. Her symptoms have worsened over the last two weeks to the point where she can only walk a short distance before becoming too short of breath. She has also developed a cough and congestion initially productive of clear and now greenish sputum. She has chest pain but only while coughing. She only has an albuterol inhaler at home, which she has been needing to use up to 8 times per day. She has been previously prescribed Symbicort but is not currently able to afford it. Quit smoking about one month ago, prior to this smoked 1/2 to 1 pack/day for 30 years. Denies fever, nausea, vomiting, chest pain at rest.   She was seen by her PCP last month who gave her one dose of Rocephin and a week of Augmentin for COPD exacerbation. CXR at the time showed chronic lung disease changes, no acute infectious findings.   ED Course: HDS, satting low 90s on 3L [place] WBC 6.3 Resp panel, Covid all negative Received solumedrol, Duoneb x 2 with improvement of symptoms CXR showed no active cardiopulmonary disease  Past Medical History: Past Medical History:  Diagnosis Date   Alcoholic hepatitis    Aortic  atherosclerosis (HCC)    Arthritis    neck   Carpal tunnel syndrome on both sides 02/2012   Cigarette nicotine dependence    Contact lens/glasses fitting    wears contacts or glasses   Dental crowns present    x 2 upper front   Elevated LFTs    Fatty liver    Fluid retention    History of MRSA infection    Hypertension    Insomnia    Migraines    Osteoarthritis    Parasomnia    Wears partial dentures    lower    Meds:  Current Outpatient Medications  Medication Instructions   betamethasone dipropionate 0.05 % lotion 1 Application, Topical, As needed   DULoxetine (CYMBALTA) 60 mg, Oral, Daily   folic acid (FOLVITE) 1 mg, Oral, Daily   gabapentin (NEURONTIN) 300 mg, Oral, 2 times daily   hydrOXYzine (ATARAX) 50 mg, Oral, 3 times daily   lactulose (CHRONULAC) 10 GM/15ML solution TAKE BY MOUTH EVERY DAY   naproxen sodium (ALEVE) 220 mg, Oral, 2 times daily PRN   Oxcarbazepine (TRILEPTAL) 300 mg, Oral, 2 times daily   pantoprazole (PROTONIX) 40 MG tablet TAKE 1 TABLET(40 MG) BY MOUTH DAILY BEFORE BREAKFAST   QUEtiapine (SEROQUEL) 50 mg, Oral, At bedtime PRN   traZODone (DESYREL) 100 mg, Oral, Daily at  bedtime     Past Surgical History:  Procedure Laterality Date   ANKLE ARTHROSCOPY WITH RECONSTRUCTION  07/2017   BIOPSY  02/24/2018   Procedure: BIOPSY;  Surgeon: Lynann Bologna, MD;  Location: [facility];  Service: Endoscopy;;   CARPAL TUNNEL RELEASE  03/17/2012   Procedure: CARPAL TUNNEL RELEASE;  Surgeon: Dominica Severin, MD;  Location: [facility];  Service: Orthopedics;  Laterality: Bilateral;  left limited open carpal tunnel release. right carpal tunnel injection with 1cc of celestone and 2cc of marcaine.25%   CARPAL TUNNEL RELEASE  06/30/2012   Procedure: CARPAL TUNNEL RELEASE;  Surgeon: Dominica Severin, MD;  Location: [facility];  Service: Orthopedics;  Laterality: Right;  RIGHT LIMITED OPEN CARPAL TUNNEL RELEASE   CARPOMETACARPAL (CMC) FUSION OF THUMB Left  06/23/2013   Procedure: LEFT CARPOMETACARPEL (CMC) FUSION OF THUMB WITH AUTOGRAFT FROM RADIUS AND REPAIR AS NECESSARY;  Surgeon: Dominica Severin, MD;  Location: [facility];  Service: Orthopedics;  Laterality: Left;   COLONOSCOPY  2014   ESOPHAGOGASTRODUODENOSCOPY  05/2017   ESOPHAGOGASTRODUODENOSCOPY (EGD) WITH PROPOFOL N/A 02/24/2018   Procedure: ESOPHAGOGASTRODUODENOSCOPY (EGD) WITH PROPOFOL;  Surgeon: Lynann Bologna, MD;  Location: [facility];  Service: Endoscopy;  Laterality: N/A;   HARDWARE REMOVAL Left 07/10/2013   Procedure: HARDWARE REMOVAL;  Surgeon: Dominica Severin, MD;  Location: [facility];  Service: Orthopedics;  Laterality: Left;   INCISION AND DRAINAGE OF WOUND Left 07/10/2013   Procedure: IRRIGATION AND DEBRIDEMENT WOUND left wrist  ;  Surgeon: Dominica Severin, MD;  Location: [facility];  Service: Orthopedics;  Laterality: Left;   LAPAROSCOPIC VAGINAL HYSTERECTOMY  02/24/2010   LAPAROSCOPY  06/2010   for adhesions   LEEP     x 2   LUMBAR DISC SURGERY  03/23/2003   left L5-S1 discectomy with microdissection   LUMBAR DISC SURGERY  05/23/2003   redo discectomy L5-S1 with microdissection    Social:  Living Situation: at home with husband Occupation: none Level of Function: independent in ADLs and iADLs PCP: Rueben Bash, PA Substances: no current tobacco, formerly 1/2-1 pack/day for 30 years. No alcohol, no drugs  Family History: noncontributory  Allergies: Allergies as of 05/03/2023 - Review Complete 05/03/2023  Allergen Reaction Noted   Hydroxyamine Other (See Comments) 01/10/2022   Lyrica [pregabalin] Other (See Comments) 11/17/2021    Review of Systems: A complete ROS was negative except as per HPI.   OBJECTIVE:   Physical Exam: Blood pressure (!) 159/108, pulse 66, temperature 98.1 F (36.7 C), temperature source Oral, resp. rate 18, height 5\' 3"  (1.6 m), weight 60 kg, SpO2 (!) 87%.  Constitutional: well-appearing, in no acute distress HENT:  normocephalic atraumatic, mucous membranes moist Eyes: conjunctiva non-erythematous Neck: supple Cardiovascular: regular rate and rhythm, no m/r/g Pulmonary/Chest: significant rales in bilateral lung fields Abdominal: soft, non-tender, non-distended MSK: normal bulk and tone Neurological: alert & oriented x 3, 5/5 strength in bilateral upper and lower extremities, normal gait Skin: warm and dry  Labs: CBC    Component Value Date/Time   WBC 6.3 05/03/2023 1609   RBC 4.41 05/03/2023 1609   HGB 13.9 05/03/2023 1609   HCT 41.6 05/03/2023 1609   PLT 179 05/03/2023 1609   MCV 94.3 05/03/2023 1609   MCH 31.5 05/03/2023 1609   MCHC 33.4 05/03/2023 1609   RDW 13.6 05/03/2023 1609   LYMPHSABS 1.3 05/03/2023 1609   MONOABS 0.3 05/03/2023 1609   EOSABS 0.6 (H) 05/03/2023 1609   BASOSABS 0.1 05/03/2023 1609     CMP  Component Value Date/Time   NA 138 05/03/2023 1609   K 4.2 05/03/2023 1609   CL 100 05/03/2023 1609   CO2 25 05/03/2023 1609   GLUCOSE 100 (H) 05/03/2023 1609   BUN 6 05/03/2023 1609   CREATININE 0.90 05/03/2023 1609   CALCIUM 9.3 05/03/2023 1609   PROT 6.5 05/02/2022 0444   ALBUMIN 3.3 (L) 05/02/2022 0444   AST 14 (L) 05/02/2022 0444   ALT 11 05/02/2022 0444   ALKPHOS 69 05/02/2022 0444   BILITOT 0.5 05/02/2022 0444   GFRNONAA >60 05/03/2023 1609   GFRAA >60 11/15/2019 0704    Imaging: DG Chest 2 View  Result Date: 05/03/2023 CLINICAL DATA:  Cough EXAM: CHEST - 2 VIEW COMPARISON:  04/30/2022 FINDINGS: Multiple cardiac leads overlie the chest. The heart size and mediastinal contours are within normal limits. No focal airspace consolidation, pleural effusion, or pneumothorax. Chronic sternal fracture. IMPRESSION: No active cardiopulmonary disease. Electronically Signed   By: Duanne Guess D.O.   On: 05/03/2023 19:41    ASSESSMENT & PLAN:   Assessment & Plan by Problem: Active Problems:   Acute hypoxic respiratory failure (HCC)   COPD exacerbation  (HCC)   Amy Villa is a 57 y.o. person living with a history of COPD who presented with dyspnea and admitted for COPD exacerbation on hospital day 0  #Acute hypoxic respiratory failure #COPD exacerbation Pt presenting with six weeks of worsening dyspnea on exertion, productive cough, requiring 3L . Only uses albuterol inhaler at home. Low suspicion for underlying infectious etiology as patient is afebrile with no leukocytosis and CXR showing no acute cardiopulmonary disease. Recently completed course of rocephin plus augmentin with no change in symptoms.  -S/p Duoneb x 2, solumedrol in ED with improvement of dyspnea -Aformoterol nebulizer BID -Revefenacin nebulizer daily -Pulmicort nebulizer BID -Prednisone 40mg  daily -Azithromycin 250mg  daily -Duoneb q4hrs  #MDD #Borderline personality disorder #GAD Follows with [facility], restarted home meds per past psychiatry note  #Cirrhosis Well-compensated, continue home lactulose   Diet: Regular VTE: Lovenox IVF: None Code: None  Prior to Admission Living Arrangement: Home Anticipated Discharge Location: Home Barriers to Discharge: Pending medical stability  Dispo: Admit patient to Observation with expected length of stay less than 2 midnights.  Signed: Monna Fam, MD Internal Medicine Resident PGY-1  05/03/2023, 9:56 PM

## 2023-05-03 NOTE — ED Notes (Signed)
ED TO INPATIENT HANDOFF REPORT  ED Nurse Name and Phone #: [phone]    S Name/Age/Gender Amy Villa 57 y.o. female Room/Bed: 009C/009C  Code Status   Code Status: Full Code  Home/SNF/Other Home Patient oriented to: self, place, time, and situation Is this baseline? Yes   Triage Complete: Triage complete  Chief Complaint COPD with exacerbation [facility]) [J44.1]  Triage Note Pt present to ED from home with c/o shortness of breath worsening the past two days. Pt denies chest pain at this time. Pt received breathing treatments per EMS. Pt A&Ox3 at this time.   Allergies Allergies  Allergen Reactions   Hydroxyamine Other (See Comments)    Not effective   Lyrica [Pregabalin] Other (See Comments)    Angry, upset     Level of Care/Admitting Diagnosis ED Disposition     ED Disposition  Admit   Condition  --   Comment  Hospital Area: [facility] [[id number]]  Level of Care: Med-Surg [16]  May place patient in observation at [facility] or [facility] if equivalent level of care is available:: No  Covid Evaluation: Confirmed COVID Negative  Diagnosis: COPD with exacerbation [facility]) [[id number]]  Admitting Physician: Dickie La [[id number]]  Attending Physician: Dickie La [[id number]]          B Medical/Surgery History Past Medical History:  Diagnosis Date   Alcoholic hepatitis    Aortic atherosclerosis (HCC)    Arthritis    neck   Carpal tunnel syndrome on both sides 02/2012   Cigarette nicotine dependence    Contact lens/glasses fitting    wears contacts or glasses   Dental crowns present    x 2 upper front   Elevated LFTs    Fatty liver    Fluid retention    History of MRSA infection    Hypertension    Insomnia    Migraines    Osteoarthritis    Parasomnia    Wears partial dentures    lower   Past Surgical History:  Procedure Laterality Date   ANKLE ARTHROSCOPY WITH RECONSTRUCTION  07/2017   BIOPSY  02/24/2018   Procedure: BIOPSY;   Surgeon: Lynann Bologna, MD;  Location: [facility];  Service: Endoscopy;;   CARPAL TUNNEL RELEASE  03/17/2012   Procedure: CARPAL TUNNEL RELEASE;  Surgeon: Dominica Severin, MD;  Location: [facility];  Service: Orthopedics;  Laterality: Bilateral;  left limited open carpal tunnel release. right carpal tunnel injection with 1cc of celestone and 2cc of marcaine.25%   CARPAL TUNNEL RELEASE  06/30/2012   Procedure: CARPAL TUNNEL RELEASE;  Surgeon: Dominica Severin, MD;  [facility];  Service: Orthopedics;  Laterality: Right;  RIGHT LIMITED OPEN CARPAL TUNNEL RELEASE   CARPOMETACARPAL (CMC) FUSION OF THUMB Left 06/23/2013   Procedure: LEFT CARPOMETACARPEL (CMC) FUSION OF THUMB WITH AUTOGRAFT FROM RADIUS AND REPAIR AS NECESSARY;  Surgeon: Dominica Severin, MD;  Location: [facility];  Service: Orthopedics;  Laterality: Left;   COLONOSCOPY  2014   ESOPHAGOGASTRODUODENOSCOPY  05/2017   ESOPHAGOGASTRODUODENOSCOPY (EGD) WITH PROPOFOL N/A 02/24/2018   Procedure: ESOPHAGOGASTRODUODENOSCOPY (EGD) WITH PROPOFOL;  Surgeon: Lynann Bologna, MD;  Location: [facility];  Service: Endoscopy;  Laterality: N/A;   HARDWARE REMOVAL Left 07/10/2013   Procedure: HARDWARE REMOVAL;  Surgeon: Dominica Severin, MD;  Location: [facility];  Service: Orthopedics;  Laterality: Left;   INCISION AND DRAINAGE OF WOUND Left 07/10/2013   Procedure: IRRIGATION AND DEBRIDEMENT WOUND left wrist  ;  Surgeon: Dominica Severin, MD;  Location: [facility];  Service: Orthopedics;  Laterality: Left;   LAPAROSCOPIC VAGINAL HYSTERECTOMY  02/24/2010   LAPAROSCOPY  06/2010   for adhesions   LEEP     x 2   LUMBAR DISC SURGERY  03/23/2003   left L5-S1 discectomy with microdissection   LUMBAR DISC SURGERY  05/23/2003   redo discectomy L5-S1 with microdissection     A IV Location/Drains/Wounds Patient Lines/Drains/Airways Status     Active Line/Drains/Airways     Name Placement date Placement time Site Days   Peripheral IV 04/29/22  22 G 1" Posterior;Right Forearm 04/29/22  1946  Forearm  369   Peripheral IV 05/03/23 18 G 1" Left Antecubital 05/03/23  1605  Antecubital  less than 1   Wound / Incision (Open or Dehisced) 09/16/19 Laceration Head Posterior 10 cm occipital laceration with staples 09/16/19  2135  Head  1325            Intake/Output Last 24 hours No intake or output data in the 24 hours ending 05/03/23 2258  Labs/Imaging Results for orders placed or performed during the hospital encounter of 05/03/23 (from the past 48 hour(s))  Basic metabolic panel     Status: Abnormal   Collection Time: 05/03/23  4:09 PM  Result Value Ref Range   Sodium 138 135 - 145 mmol/L   Potassium 4.2 3.5 - 5.1 mmol/L    Comment: HEMOLYSIS AT THIS LEVEL MAY AFFECT RESULT   Chloride 100 98 - 111 mmol/L   CO2 25 22 - 32 mmol/L   Glucose, Bld 100 (H) 70 - 99 mg/dL    Comment: Glucose reference range applies only to samples taken after fasting for at least 8 hours.   BUN 6 6 - 20 mg/dL   Creatinine, Ser 1.91 0.44 - 1.00 mg/dL   Calcium 9.3 8.9 - 47.8 mg/dL   GFR, Estimated >29 >56 mL/min    Comment: (NOTE) Calculated using the CKD-EPI Creatinine Equation (2021)    Anion gap 13 5 - 15    Comment: Performed at [facility], [address], [place], [place] [address]  CBC with Differential     Status: Abnormal   Collection Time: 05/03/23  4:09 PM  Result Value Ref Range   WBC 6.3 4.0 - 10.5 K/uL   RBC 4.41 3.87 - 5.11 MIL/uL   Hemoglobin 13.9 12.0 - 15.0 g/dL   HCT 65.7 84.6 - 96.2 %   MCV 94.3 80.0 - 100.0 fL   MCH 31.5 26.0 - 34.0 pg   MCHC 33.4 30.0 - 36.0 g/dL   RDW 95.2 84.1 - 32.4 %   Platelets 179 150 - 400 K/uL   nRBC 0.0 0.0 - 0.2 %   Neutrophils Relative % 65 %   Neutro Abs 4.1 1.7 - 7.7 K/uL   Lymphocytes Relative 20 %   Lymphs Abs 1.3 0.7 - 4.0 K/uL   Monocytes Relative 5 %   Monocytes Absolute 0.3 0.1 - 1.0 K/uL   Eosinophils Relative 9 %   Eosinophils Absolute 0.6 (H) 0.0 - 0.5 K/uL    Basophils Relative 1 %   Basophils Absolute 0.1 0.0 - 0.1 K/uL   Immature Granulocytes 0 %   Abs Immature Granulocytes 0.02 0.00 - 0.07 K/uL    Comment: Performed at [facility], [address], [place], [place] [address]  Resp panel by RT-PCR (RSV, Flu A&B, Covid) Anterior Nasal Swab     Status: None   Collection Time: 05/03/23  4:09 PM   Specimen: Anterior Nasal Swab  Result Value Ref Range   SARS Coronavirus 2 by RT PCR NEGATIVE NEGATIVE   Influenza A by PCR NEGATIVE NEGATIVE   Influenza B by PCR NEGATIVE NEGATIVE    Comment: (NOTE) The Xpert Xpress SARS-CoV-2/FLU/RSV plus assay is intended as an aid in the diagnosis of influenza from Nasopharyngeal swab specimens and should not be used as a sole basis for treatment. Nasal washings and aspirates are unacceptable for Xpert Xpress SARS-CoV-2/FLU/RSV testing.  Fact Sheet for Patients: BloggerCourse.com  Fact Sheet for Healthcare Providers: SeriousBroker.it  This test is not yet approved or cleared by the [place] FDA and has been authorized for detection and/or diagnosis of SARS-CoV-2 by FDA under an Emergency Use Authorization (EUA). This EUA will remain in effect (meaning this test can be used) for the duration of the COVID-19 declaration under Section 564(b)(1) of the Act, 21 U.S.C. section 360bbb-3(b)(1), unless the authorization is terminated or revoked.     Resp Syncytial Virus by PCR NEGATIVE NEGATIVE    Comment: (NOTE) Fact Sheet for Patients: BloggerCourse.com  Fact Sheet for Healthcare Providers: SeriousBroker.it  This test is not yet approved or cleared by the [place] FDA and has been authorized for detection and/or diagnosis of SARS-CoV-2 by FDA under an Emergency Use Authorization (EUA). This EUA will remain in effect (meaning this test can be used) for the duration of the COVID-19 declaration  under Section 564(b)(1) of the Act, 21 U.S.C. section 360bbb-3(b)(1), unless the authorization is terminated or revoked.  Performed at [facility], [address], [place], [place] [address]    DG Chest 2 View  Result Date: 05/03/2023 CLINICAL DATA:  Cough EXAM: CHEST - 2 VIEW COMPARISON:  04/30/2022 FINDINGS: Multiple cardiac leads overlie the chest. The heart size and mediastinal contours are within normal limits. No focal airspace consolidation, pleural effusion, or pneumothorax. Chronic sternal fracture. IMPRESSION: No active cardiopulmonary disease. Electronically Signed   By: Duanne Guess D.O.   On: 05/03/2023 19:41    Pending Labs Unresulted Labs (From admission, onward)     Start     Ordered   05/04/23 0500  CBC  Tomorrow morning,   R        05/03/23 2247   05/04/23 0500  Basic metabolic panel  Tomorrow morning,   R        05/03/23 2247   05/03/23 2230  HIV Antibody (routine testing w rflx)  (HIV Antibody (Routine testing w reflex) panel)  Once,   R        05/03/23 2247            Vitals/Pain Today's Vitals   05/03/23 2000 05/03/23 2015 05/03/23 2027 05/03/23 2030  BP: (!) 141/97 (!) 151/117  (!) 159/108  Pulse: 62 63  66  Resp: 12 13  18   Temp:   98.1 F (36.7 C)   TempSrc:   Oral   SpO2: 97% 94%  (!) 87%  Weight:      Height:      PainSc:        Isolation Precautions No active isolations  Medications Medications  enoxaparin (LOVENOX) injection 40 mg (has no administration in time range)  sodium chloride flush (NS) 0.9 % injection 3 mL (has no administration in time range)  revefenacin (YUPELRI) nebulizer solution 175 mcg (has no administration in time range)  budesonide (PULMICORT) nebulizer solution 0.25 mg (has no administration in time range)  arformoterol (BROVANA) nebulizer solution  15 mcg (has no administration in time range)  ipratropium-albuterol (DUONEB) 0.5-2.5 (3) MG/3ML nebulizer solution 3 mL (has no administration in time  range)  predniSONE (DELTASONE) tablet 40 mg (has no administration in time range)  azithromycin (ZITHROMAX) tablet 250 mg (has no administration in time range)  DULoxetine (CYMBALTA) DR capsule 30 mg (has no administration in time range)  gabapentin (NEURONTIN) capsule 300 mg (has no administration in time range)  hydrOXYzine (ATARAX) tablet 25 mg (has no administration in time range)  lactulose (CHRONULAC) 10 GM/15ML solution 20 g (has no administration in time range)  Oxcarbazepine (TRILEPTAL) tablet 300 mg (has no administration in time range)  pantoprazole (PROTONIX) EC tablet 40 mg (has no administration in time range)  propranolol (INDERAL) tablet 10 mg (has no administration in time range)  traZODone (DESYREL) tablet 100 mg (has no administration in time range)  QUEtiapine (SEROQUEL) tablet 300 mg (has no administration in time range)  ipratropium-albuterol (DUONEB) 0.5-2.5 (3) MG/3ML nebulizer solution 3 mL (3 mLs Nebulization Given 05/03/23 1731)  albuterol (PROVENTIL) (2.5 MG/3ML) 0.083% nebulizer solution 2.5 mg (2.5 mg Nebulization Given 05/03/23 1731)  methylPREDNISolone sodium succinate (SOLU-MEDROL) 125 mg/2 mL injection 125 mg (125 mg Intravenous Given 05/03/23 1732)  ipratropium-albuterol (DUONEB) 0.5-2.5 (3) MG/3ML nebulizer solution 3 mL (3 mLs Nebulization Given 05/03/23 1943)  albuterol (PROVENTIL) (2.5 MG/3ML) 0.083% nebulizer solution 2.5 mg (2.5 mg Nebulization Given 05/03/23 1943)    Mobility walks     Focused Assessments Pulmonary Assessment Handoff:  Lung sounds: Bilateral Breath Sounds: Rhonchi, Rales O2 Device: Nasal Cannula O2 Flow Rate (L/min): 2 L/min    R Recommendations: See Admitting Provider Note  Report given to:   Additional Notes:  Pt is A&O X4, independent with ambulation/ADL. Please call with any questions

## 2023-05-03 NOTE — Hospital Course (Addendum)
Spirometry shows ratio 75% FEV1 83% FVC 90% indicating normal lung function   6 weeks worsening coughing, clear phlegm 2days acute worsening, couldn't breathe, feeling of junk in her chest Couldn't walk a few steps without shortness of breath Chest tightness last few days 1 week of green sputum  Intermittent numbness in arm  COPD: albuterol only, 6-8 times per day for last 3 weeks. No maintenance inhaler One month ago 1 week amoxicillin  No tobacco use since last month, 1/2 pack per day prior30 years No IV drug use No alcohol use  No work Lives at home with husband Very active on large property  Has symbicort at home but isn't using   Full code  Principal Problem:   COPD exacerbation (HCC) Active Problems:   Liver cirrhosis (HCC)   Anxiety and depression   Acute hypoxic respiratory failure (HCC)  COPD exacerbation With acute hypoxic respiratory failure, requiring ~2 L to maintain SpO2 > 88%. Not on home O2. Improving since presentation to ED. Doubt pneumonia without fever or CXR findings to support. Does have some green sputum so will start azithromycin. Triple therapy via nebulizer, per below. Will need updated regimen for COPD as she was only on ICS at home prior to this, and using that infrequently. I think ICS continuation is appropriate given peripheral eosinophilia today. Note that outpatient spirometry > 1 year ago didn't support obstructive lung disease, but PCP was in process of ordering formal PFT. - lama/laba/ics via neb - duonebs q4 h - prednisone 40 mg x 5 days - azithromycin 250 mg x 3 days  Mood disorder Pretty extensive psychiatric history. Follows with psychiatry at [facility]. Pulled diagnoses of MDD, borderline personality and GAD from their last note, in which they outline regimen below. We continued these medications per that note and med reconciliation that we performed with patient. - trazodone 100 mg nightly - seroquel 300 mg nightly - oxcarbazepine 300  mg bid - hydroxyzine 25 mg tid - duloxetine 30 mg every day  Neuropathy Takes outpatient gabapentin for this, continued.  Cirrhosis Well-compensated. Continue outpatient lactulose.

## 2023-05-03 NOTE — H&P (Incomplete)
Date: 05/03/2023               Patient Name:  Amy Villa MRN: [id number]  DOB: 1966-07-20 Age / Sex: 57 y.o., female   PCP: Roger Kill, PA-C         Medical Service: Internal Medicine Teaching Service         Attending Physician: Dr. Pricilla Loveless, MD      First Contact: Dr. Monna Fam, MD Pager [phone]    Second Contact: Dr. Marrianne Mood, MD Pager [phone]         After Hours (After 5p/  First Contact Pager: [phone]  weekends / holidays): Second Contact Pager: [phone]   SUBJECTIVE   Chief Complaint: shortness of breath  History of Present Illness:  Patient is a 57 year old woman with past medical history significant for COPD not on home oxygen, cirrhosis, presenting with shortness of breath. Patient reports worsening dyspnea on exertion and wheezing over the past six weeks. Her symptoms have worsened over the last two weeks to the point where she can only walk a short distance before becoming too short of breath. She has also developed a cough and congestion initially productive of clear and now greenish sputum. She has chest pain but only while coughing. She only has an albuterol inhaler at home, which she has been needing to use up to 8 times per day. She has been previously prescribed Symbicort but is not currently able to afford it. Quit smoking about one month ago, prior to this smoked 1/2 to 1 pack/day for 30 years. Denies fever, nausea, vomiting, chest pain at rest.   She was seen by her PCP last month who gave her one dose of Rocephin and a week of Augmentin for COPD exacerbation. CXR at the time showed chronic lung disease changes, no acute infectious findings.   ED Course: HDS, satting low 90s on 3L [place] WBC 6.3 Resp panel, Covid all negative Received solumedrol, Duoneb x 2 with improvement of symptoms CXR showed no active cardiopulmonary disease  Past Medical History: Past Medical History:  Diagnosis Date  . Alcoholic hepatitis   . Aortic  atherosclerosis (HCC)   . Arthritis    neck  . Carpal tunnel syndrome on both sides 02/2012  . Cigarette nicotine dependence   . Contact lens/glasses fitting    wears contacts or glasses  . Dental crowns present    x 2 upper front  . Elevated LFTs   . Fatty liver   . Fluid retention   . History of MRSA infection   . Hypertension   . Insomnia   . Migraines   . Osteoarthritis   . Parasomnia   . Wears partial dentures    lower    Meds:  Current Outpatient Medications  Medication Instructions  . betamethasone dipropionate 0.05 % lotion 1 Application, Topical, As needed  . DULoxetine (CYMBALTA) 60 mg, Oral, Daily  . folic acid (FOLVITE) 1 mg, Oral, Daily  . gabapentin (NEURONTIN) 300 mg, Oral, 2 times daily  . hydrOXYzine (ATARAX) 50 mg, Oral, 3 times daily  . lactulose (CHRONULAC) 10 GM/15ML solution TAKE BY MOUTH EVERY DAY  . naproxen sodium (ALEVE) 220 mg, Oral, 2 times daily PRN  . Oxcarbazepine (TRILEPTAL) 300 mg, Oral, 2 times daily  . pantoprazole (PROTONIX) 40 MG tablet TAKE 1 TABLET(40 MG) BY MOUTH DAILY BEFORE BREAKFAST  . QUEtiapine (SEROQUEL) 50 mg, Oral, At bedtime PRN  . traZODone (DESYREL) 100 mg, Oral, Daily at  bedtime     Past Surgical History:  Procedure Laterality Date  . ANKLE ARTHROSCOPY WITH RECONSTRUCTION  07/2017  . BIOPSY  02/24/2018   Procedure: BIOPSY;  Surgeon: Lynann Bologna, MD;  [facility];  Service: Endoscopy;;  . CARPAL TUNNEL RELEASE  03/17/2012   Procedure: CARPAL TUNNEL RELEASE;  Surgeon: Dominica Severin, MD;  Location: [facility];  Service: Orthopedics;  Laterality: Bilateral;  left limited open carpal tunnel release. right carpal tunnel injection with 1cc of celestone and 2cc of marcaine.25%  . CARPAL TUNNEL RELEASE  06/30/2012   Procedure: CARPAL TUNNEL RELEASE;  Surgeon: Dominica Severin, MD;  [facility];  Service: Orthopedics;  Laterality: Right;  RIGHT LIMITED OPEN CARPAL TUNNEL RELEASE  .  CARPOMETACARPAL (CMC) FUSION OF THUMB Left 06/23/2013   Procedure: LEFT CARPOMETACARPEL (CMC) FUSION OF THUMB WITH AUTOGRAFT FROM RADIUS AND REPAIR AS NECESSARY;  Surgeon: Dominica Severin, MD;  Location: [facility];  Service: Orthopedics;  Laterality: Left;  . COLONOSCOPY  2014  . ESOPHAGOGASTRODUODENOSCOPY  05/2017  . ESOPHAGOGASTRODUODENOSCOPY (EGD) WITH PROPOFOL N/A 02/24/2018   Procedure: ESOPHAGOGASTRODUODENOSCOPY (EGD) WITH PROPOFOL;  Surgeon: Lynann Bologna, MD;  [facility];  Service: Endoscopy;  Laterality: N/A;  . HARDWARE REMOVAL Left 07/10/2013   Procedure: HARDWARE REMOVAL;  Surgeon: Dominica Severin, MD;  [facility];  Service: Orthopedics;  Laterality: Left;  . INCISION AND DRAINAGE OF WOUND Left 07/10/2013   Procedure: IRRIGATION AND DEBRIDEMENT WOUND left wrist  ;  Surgeon: Dominica Severin, MD;  [facility];  Service: Orthopedics;  Laterality: Left;  . LAPAROSCOPIC VAGINAL HYSTERECTOMY  02/24/2010  . LAPAROSCOPY  06/2010   for adhesions  . LEEP     x 2  . LUMBAR DISC SURGERY  03/23/2003   left L5-S1 discectomy with microdissection  . LUMBAR DISC SURGERY  05/23/2003   redo discectomy L5-S1 with microdissection    Social:  Living Situation: at home with husband Occupation: none Level of Function: independent in ADLs and iADLs PCP: Rueben Bash, PA Substances: no current tobacco, formerly 1/2-1 pack/day for 30 years. No alcohol, no drugs  Family History: noncontributory  Allergies: Allergies as of 05/03/2023 - Review Complete 05/03/2023  Allergen Reaction Noted  . Hydroxyamine Other (See Comments) 01/10/2022  . Lyrica [pregabalin] Other (See Comments) 11/17/2021    Review of Systems: A complete ROS was negative except as per HPI.   OBJECTIVE:   Physical Exam: Blood pressure (!) 159/108, pulse 66, temperature 98.1 F (36.7 C), temperature source Oral, resp. rate 18, height 5\' 3"  (1.6 m), weight 60 kg, SpO2 (!) 87%.   Constitutional: well-appearing, in no acute distress HENT: normocephalic atraumatic, mucous membranes moist Eyes: conjunctiva non-erythematous Neck: supple Cardiovascular: regular rate and rhythm, no m/r/g Pulmonary/Chest: normal work of breathing on room air, lungs clear to auscultation bilaterally Abdominal: soft, non-tender, non-distended MSK: normal bulk and tone Neurological: alert & oriented x 3, 5/5 strength in bilateral upper and lower extremities, normal gait Skin: warm and dry  Labs: CBC    Component Value Date/Time   WBC 6.3 05/03/2023 1609   RBC 4.41 05/03/2023 1609   HGB 13.9 05/03/2023 1609   HCT 41.6 05/03/2023 1609   PLT 179 05/03/2023 1609   MCV 94.3 05/03/2023 1609   MCH 31.5 05/03/2023 1609   MCHC 33.4 05/03/2023 1609   RDW 13.6 05/03/2023 1609   LYMPHSABS 1.3 05/03/2023 1609   MONOABS 0.3 05/03/2023 1609   EOSABS 0.6 (H) 05/03/2023 1609   BASOSABS 0.1 05/03/2023  1609     CMP     Component Value Date/Time   NA 138 05/03/2023 1609   K 4.2 05/03/2023 1609   CL 100 05/03/2023 1609   CO2 25 05/03/2023 1609   GLUCOSE 100 (H) 05/03/2023 1609   BUN 6 05/03/2023 1609   CREATININE 0.90 05/03/2023 1609   CALCIUM 9.3 05/03/2023 1609   PROT 6.5 05/02/2022 0444   ALBUMIN 3.3 (L) 05/02/2022 0444   AST 14 (L) 05/02/2022 0444   ALT 11 05/02/2022 0444   ALKPHOS 69 05/02/2022 0444   BILITOT 0.5 05/02/2022 0444   GFRNONAA >60 05/03/2023 1609   GFRAA >60 11/15/2019 0704    Imaging: DG Chest 2 View  Result Date: 05/03/2023 CLINICAL DATA:  Cough EXAM: CHEST - 2 VIEW COMPARISON:  04/30/2022 FINDINGS: Multiple cardiac leads overlie the chest. The heart size and mediastinal contours are within normal limits. No focal airspace consolidation, pleural effusion, or pneumothorax. Chronic sternal fracture. IMPRESSION: No active cardiopulmonary disease. Electronically Signed   By: Duanne Guess D.O.   On: 05/03/2023 19:41    ASSESSMENT & PLAN:   Assessment & Plan by  Problem: Active Problems:   Acute hypoxic respiratory failure (HCC)   COPD exacerbation (HCC)   Amy Villa is a 57 y.o. person living with a history of COPD who presented with dyspnea and admitted for COPD exacerbation on hospital day 0  #Acute hypoxic respiratory failure #COPD exacerbation Pt presenting with six weeks of worsening dyspnea on exertion, productive cough, requiring 3L . Only uses albuterol inhaler at home. Low suspicion for underlying infectious etiology as patient is afebrile with no leukocytosis and CXR showing no acute cardiopulmonary disease. Recently completed course of rocephin plus augmentin with no change in symptoms.  -S/p Duoneb x 2, solumedrol in ED with improvement of dyspnea -Aformoterol nebulizer BID -Revefenacin nebulizer daily -Pulmicort nebulizer BID -Prednisone 40mg  daily -Duoneb q4hrs  #MDD #Borderline personality disorder #GAD Follows with [facility] psychiatry, restarted home meds per past psychiatry note  #Cirrhosis Well-compensated, continue home lactulose   Diet: Regular VTE: Lovenox IVF: None Code: None  Prior to Admission Living Arrangement: Home Anticipated Discharge Location: Home Barriers to Discharge: Pending medical stability  Dispo: Admit patient to Observation with expected length of stay less than 2 midnights.  Signed: Monna Fam, MD Internal Medicine Resident PGY-1  05/03/2023, 9:56 PM

## 2023-05-04 ENCOUNTER — Other Ambulatory Visit (HOSPITAL_COMMUNITY): Payer: Self-pay

## 2023-05-04 DIAGNOSIS — J441 Chronic obstructive pulmonary disease with (acute) exacerbation: Secondary | ICD-10-CM | POA: Diagnosis not present

## 2023-05-04 DIAGNOSIS — J9601 Acute respiratory failure with hypoxia: Secondary | ICD-10-CM | POA: Diagnosis not present

## 2023-05-04 LAB — BASIC METABOLIC PANEL
Anion gap: 10 (ref 5–15)
BUN: 9 mg/dL (ref 6–20)
CO2: 27 mmol/L (ref 22–32)
Calcium: 9.6 mg/dL (ref 8.9–10.3)
Chloride: 100 mmol/L (ref 98–111)
Creatinine, Ser: 0.89 mg/dL (ref 0.44–1.00)
GFR, Estimated: >60 mL/min (ref >60)
Glucose, Bld: 176 mg/dL — ABNORMAL HIGH (ref 70–99)
Potassium: 4.1 mmol/L (ref 3.5–5.1)
Sodium: 137 mmol/L (ref 135–145)

## 2023-05-04 LAB — CBC
HCT: 40.5 % (ref 36.0–46.0)
Hemoglobin: 13.8 g/dL (ref 12.0–15.0)
MCH: 32.2 pg (ref 26.0–34.0)
MCHC: 34.1 g/dL (ref 30.0–36.0)
MCV: 94.6 fL (ref 80.0–100.0)
Platelets: 158 10*3/uL (ref 150–400)
RBC: 4.28 MIL/uL (ref 3.87–5.11)
RDW: 13.7 % (ref 11.5–15.5)
WBC: 8.6 10*3/uL (ref 4.0–10.5)
nRBC: 0 % (ref 0.0–0.2)

## 2023-05-04 LAB — HIV ANTIBODY (ROUTINE TESTING W REFLEX): HIV Screen 4th Generation wRfx: NONREACTIVE

## 2023-05-04 MED ORDER — GABAPENTIN 300 MG PO CAPS
300.0000 mg | ORAL_CAPSULE | Freq: Two times a day (BID) | ORAL | Status: DC
  Administered 2023-05-04 – 2023-05-06 (×4): 300 mg via ORAL
  Filled 2023-05-04 (×4): qty 1

## 2023-05-04 MED ORDER — AZITHROMYCIN 500 MG PO TABS
250.0000 mg | ORAL_TABLET | Freq: Every day | ORAL | Status: DC
  Administered 2023-05-04 – 2023-05-05 (×2): 250 mg via ORAL
  Filled 2023-05-04 (×2): qty 1

## 2023-05-04 MED ORDER — QUETIAPINE FUMARATE 100 MG PO TABS
400.0000 mg | ORAL_TABLET | Freq: Every day | ORAL | Status: DC
  Administered 2023-05-04 – 2023-05-05 (×2): 400 mg via ORAL
  Filled 2023-05-04 (×2): qty 4

## 2023-05-04 MED ORDER — IPRATROPIUM-ALBUTEROL 0.5-2.5 (3) MG/3ML IN SOLN
3.0000 mL | Freq: Two times a day (BID) | RESPIRATORY_TRACT | Status: AC
  Administered 2023-05-04 – 2023-05-05 (×2): 3 mL via RESPIRATORY_TRACT
  Filled 2023-05-04 (×2): qty 3

## 2023-05-04 MED ORDER — PROPRANOLOL HCL 10 MG PO TABS
10.0000 mg | ORAL_TABLET | Freq: Every day | ORAL | Status: DC
  Administered 2023-05-05 – 2023-05-06 (×2): 10 mg via ORAL
  Filled 2023-05-04 (×2): qty 1

## 2023-05-04 MED ORDER — PROPRANOLOL HCL 10 MG PO TABS
10.0000 mg | ORAL_TABLET | Freq: Every day | ORAL | Status: DC
  Administered 2023-05-04 – 2023-05-06 (×3): 10 mg via ORAL
  Filled 2023-05-04 (×3): qty 1

## 2023-05-04 MED ORDER — PROPRANOLOL HCL 20 MG PO TABS
20.0000 mg | ORAL_TABLET | Freq: Once | ORAL | Status: AC
  Administered 2023-05-04: 20 mg via ORAL
  Filled 2023-05-04: qty 1

## 2023-05-04 MED ORDER — OXCARBAZEPINE 300 MG PO TABS
300.0000 mg | ORAL_TABLET | Freq: Every day | ORAL | Status: DC
  Administered 2023-05-05 – 2023-05-06 (×2): 300 mg via ORAL
  Filled 2023-05-04 (×2): qty 1

## 2023-05-04 MED ORDER — HYDROXYZINE HCL 25 MG PO TABS: 50.0000 mg | ORAL_TABLET | Freq: Three times a day (TID) | ORAL | Status: DC | PRN

## 2023-05-04 MED ORDER — DULOXETINE HCL 60 MG PO CPEP: 90.0000 mg | ORAL_CAPSULE | Freq: Every day | ORAL | Status: DC

## 2023-05-04 MED ORDER — DULOXETINE HCL 30 MG PO CPEP
30.0000 mg | ORAL_CAPSULE | Freq: Three times a day (TID) | ORAL | Status: DC
  Administered 2023-05-04 (×2): 30 mg via ORAL
  Filled 2023-05-04 (×3): qty 1

## 2023-05-04 MED ORDER — HYDROXYZINE HCL 25 MG PO TABS: 50.0000 mg | ORAL_TABLET | Freq: Three times a day (TID) | ORAL | Status: DC

## 2023-05-04 NOTE — Plan of Care (Signed)

## 2023-05-04 NOTE — Progress Notes (Addendum)
HD#0 SUBJECTIVE:  Patient Summary: Amy Villa is a 57 y.o. with a PMH of COPD, coming in with worsening SOB, increased sputum production and change in color admitted for COPD exacerbation.  Overnight Events: NAEO  Interim History: Feels much better, could not afford her medications, but is willing to pay $20 for her inhalers.   OBJECTIVE:  Vital Signs: Vitals:   05/04/23 0755 05/04/23 0800 05/04/23 0852 05/04/23 0915  BP: 115/75     Pulse: (!) 51  72   Resp: 16     Temp: 98 F (36.7 C)     TempSrc:      SpO2: (!) 86% 90%  93%  Weight:      Height:       Supplemental O2: Nasal Cannula SpO2: 93 % O2 Flow Rate (L/min): 4 L/min  Filed Weights   05/03/23 1601  Weight: 60 kg     Intake/Output Summary (Last 24 hours) at 05/04/2023 1135 Last data filed at 05/04/2023 0755 Gross per 24 hour  Intake 377 ml  Output --  Net 377 ml   Net IO Since Admission: 377 mL [05/04/23 1135]  Physical Exam: Physical Exam Constitutional:      General: She is not in acute distress.    Appearance: She is not ill-appearing or toxic-appearing.  Cardiovascular:     Rate and Rhythm: Normal rate and regular rhythm.     Heart sounds: No murmur heard.    No friction rub. No gallop.  Pulmonary:     Effort: Pulmonary effort is normal. No respiratory distress.     Breath sounds: Examination of the left-middle field reveals wheezing. Examination of the left-lower field reveals wheezing. Wheezing present. No decreased breath sounds, rhonchi or rales.  Abdominal:     General: Bowel sounds are normal.     Palpations: Abdomen is soft.     Tenderness: There is no abdominal tenderness.  Musculoskeletal:     Right lower leg: No edema.     Left lower leg: No edema.  Neurological:     Mental Status: She is alert.    Patient Lines/Drains/Airways Status     Active Line/Drains/Airways     Name Placement date Placement time Site Days   Peripheral IV 04/29/22 22 G 1" Posterior;Right  Forearm 04/29/22  1946  Forearm  370   Peripheral IV 05/03/23 18 G 1" Left Antecubital 05/03/23  1605  Antecubital  1   Wound / Incision (Open or Dehisced) 09/16/19 Laceration Head Posterior 10 cm occipital laceration with staples 09/16/19  2135  Head  1326             ASSESSMENT/PLAN:  Assessment: Principal Problem:   COPD exacerbation (HCC) Active Problems:   Liver cirrhosis (HCC)   Anxiety and depression   Acute hypoxic respiratory failure (HCC)  Amy Villa is a 57 y.o. with a PMH of COPD, coming in with worsening SOB, increased sputum production and change in color admitted for COPD exacerbation.  Plan:  #Acute hypoxic respiratory failure #COPD exacerbation Currently requiring about 4L of O2, none at baseline. Improved with LABA+LAMA+ICS. She has eosinophilia in labs. Outpatient will need Brestri or Trelegy. Appreciate pharmacy's assistance with this. She would have a copay of $20. -S/p Duoneb x 2, solumedrol in ED with improvement of dyspnea -cw Aformoterol nebulizer BID -cw Revefenacin nebulizer daily -cw Pulmicort nebulizer BID -cw Prednisone 40mg  daily -cw Azithromycin 250mg  daily -cw Duoneb q4hrs   #MDD #Borderline personality disorder #GAD Follows with [facility]  psychiatry. Per their note, patient is to have the below: - Cymbalta 90 mg daily for depression/anxiety - Trazodone 100 mg at bedtime for sleep  - Trileptal 300 mg daily - Hydroxyzine 50 mg TID for anxiety - Seroquel 400 mg at bedtime - Propranolol to 10 mg qAM, 10 mg at noon, and 20 mg QHS - Gabapentin 300 mg BID   #Cirrhosis Well-compensated, continue home lactulose  #Nodule on CT 04/2022 Noted in October 2023. Required follow-up at three months. CT scan done in January 2024 at [facility] states no focal pulmonary nodules or infiltrates.   Diet: Regular VTE: Lovenox IVF: None Code: None   Prior to Admission Living Arrangement: Home Anticipated Discharge Location: Home Barriers to  Discharge: Pending medical stability  Signature: [facility]  Internal Medicine Resident, PGY-1 [facility]  Pager: [phone] 11:35 AM, 05/04/2023   Please contact the on call pager after 5 pm and on weekends at [phone].

## 2023-05-04 NOTE — ED Notes (Signed)
Patient transported to the floor via stretcher by ER tech

## 2023-05-05 DIAGNOSIS — F329 Major depressive disorder, single episode, unspecified: Secondary | ICD-10-CM | POA: Diagnosis present

## 2023-05-05 DIAGNOSIS — Z981 Arthrodesis status: Secondary | ICD-10-CM | POA: Diagnosis not present

## 2023-05-05 DIAGNOSIS — Z888 Allergy status to other drugs, medicaments and biological substances status: Secondary | ICD-10-CM | POA: Diagnosis not present

## 2023-05-05 DIAGNOSIS — G4700 Insomnia, unspecified: Secondary | ICD-10-CM | POA: Diagnosis present

## 2023-05-05 DIAGNOSIS — G4750 Parasomnia, unspecified: Secondary | ICD-10-CM | POA: Diagnosis present

## 2023-05-05 DIAGNOSIS — D721 Eosinophilia, unspecified: Secondary | ICD-10-CM | POA: Diagnosis present

## 2023-05-05 DIAGNOSIS — K7030 Alcoholic cirrhosis of liver without ascites: Secondary | ICD-10-CM | POA: Diagnosis present

## 2023-05-05 DIAGNOSIS — J441 Chronic obstructive pulmonary disease with (acute) exacerbation: Secondary | ICD-10-CM | POA: Diagnosis present

## 2023-05-05 DIAGNOSIS — Z1152 Encounter for screening for COVID-19: Secondary | ICD-10-CM | POA: Diagnosis not present

## 2023-05-05 DIAGNOSIS — I700 Atherosclerosis of aorta: Secondary | ICD-10-CM | POA: Diagnosis present

## 2023-05-05 DIAGNOSIS — J9601 Acute respiratory failure with hypoxia: Secondary | ICD-10-CM | POA: Diagnosis present

## 2023-05-05 DIAGNOSIS — F603 Borderline personality disorder: Secondary | ICD-10-CM | POA: Diagnosis present

## 2023-05-05 DIAGNOSIS — G629 Polyneuropathy, unspecified: Secondary | ICD-10-CM | POA: Diagnosis present

## 2023-05-05 DIAGNOSIS — E876 Hypokalemia: Secondary | ICD-10-CM | POA: Diagnosis present

## 2023-05-05 DIAGNOSIS — Z87891 Personal history of nicotine dependence: Secondary | ICD-10-CM | POA: Diagnosis not present

## 2023-05-05 DIAGNOSIS — I10 Essential (primary) hypertension: Secondary | ICD-10-CM | POA: Diagnosis present

## 2023-05-05 DIAGNOSIS — F411 Generalized anxiety disorder: Secondary | ICD-10-CM | POA: Diagnosis present

## 2023-05-05 DIAGNOSIS — Z79899 Other long term (current) drug therapy: Secondary | ICD-10-CM | POA: Diagnosis not present

## 2023-05-05 LAB — CBC
HCT: 37.9 % (ref 36.0–46.0)
Hemoglobin: 12.6 g/dL (ref 12.0–15.0)
MCH: 31.7 pg (ref 26.0–34.0)
MCHC: 33.2 g/dL (ref 30.0–36.0)
MCV: 95.5 fL (ref 80.0–100.0)
Platelets: 151 10*3/uL (ref 150–400)
RBC: 3.97 MIL/uL (ref 3.87–5.11)
RDW: 14 % (ref 11.5–15.5)
WBC: 11 10*3/uL — ABNORMAL HIGH (ref 4.0–10.5)
nRBC: 0 % (ref 0.0–0.2)

## 2023-05-05 LAB — RENAL FUNCTION PANEL
Albumin: 3.6 g/dL (ref 3.5–5.0)
Anion gap: 12 (ref 5–15)
BUN: 11 mg/dL (ref 6–20)
CO2: 29 mmol/L (ref 22–32)
Calcium: 9.8 mg/dL (ref 8.9–10.3)
Chloride: 96 mmol/L — ABNORMAL LOW (ref 98–111)
Creatinine, Ser: 0.84 mg/dL (ref 0.44–1.00)
GFR, Estimated: >60 mL/min (ref >60)
Glucose, Bld: 122 mg/dL — ABNORMAL HIGH (ref 70–99)
Phosphorus: 3.9 mg/dL (ref 2.5–4.6)
Potassium: 4.3 mmol/L (ref 3.5–5.1)
Sodium: 137 mmol/L (ref 135–145)

## 2023-05-05 MED ORDER — PROPRANOLOL HCL 20 MG PO TABS
20.0000 mg | ORAL_TABLET | Freq: Every day | ORAL | Status: DC
  Administered 2023-05-05: 20 mg via ORAL
  Filled 2023-05-05 (×2): qty 1

## 2023-05-05 MED ORDER — GUAIFENESIN ER 600 MG PO TB12
600.0000 mg | ORAL_TABLET | Freq: Two times a day (BID) | ORAL | Status: DC | PRN
  Administered 2023-05-05 – 2023-05-06 (×2): 600 mg via ORAL
  Filled 2023-05-05 (×2): qty 1

## 2023-05-05 MED ORDER — FLUTICASONE PROPIONATE 50 MCG/ACT NA SUSP
1.0000 | Freq: Every day | NASAL | Status: DC
  Administered 2023-05-05 – 2023-05-06 (×2): 1 via NASAL
  Filled 2023-05-05: qty 16

## 2023-05-05 MED ORDER — DULOXETINE HCL 60 MG PO CPEP
90.0000 mg | ORAL_CAPSULE | Freq: Once | ORAL | Status: AC
  Administered 2023-05-05: 90 mg via ORAL
  Filled 2023-05-05: qty 1

## 2023-05-05 MED ORDER — IPRATROPIUM-ALBUTEROL 0.5-2.5 (3) MG/3ML IN SOLN: 3.0000 mL | RESPIRATORY_TRACT | Status: DC

## 2023-05-05 MED ORDER — IPRATROPIUM-ALBUTEROL 0.5-2.5 (3) MG/3ML IN SOLN: 3.0000 mL | RESPIRATORY_TRACT | Status: DC | PRN

## 2023-05-05 MED ORDER — DULOXETINE HCL 60 MG PO CPEP
90.0000 mg | ORAL_CAPSULE | Freq: Every day | ORAL | Status: DC
  Administered 2023-05-06: 90 mg via ORAL
  Filled 2023-05-05: qty 1

## 2023-05-05 NOTE — Progress Notes (Signed)
Pt's O2 sat 79% RA after going to the bathroom. Recovered to 88% 2L at rest. Ambulating in hallway 83% on 2L. Increased to 3L and 88% ambulating. Pt reported she didn't feel lightheaded or anything during exertion.

## 2023-05-05 NOTE — Plan of Care (Signed)

## 2023-05-05 NOTE — Progress Notes (Signed)
    Durable Medical Equipment  (From admission, onward)           Start     Ordered   05/05/23 1435  For home use only DME oxygen  Once       Question Answer Comment  Length of Need 6 Months   Mode or (Route) Nasal cannula   Liters per Minute 2   Frequency Continuous (stationary and portable oxygen unit needed)   Oxygen conserving device Yes   Oxygen delivery system Gas      05/05/23 1435   05/05/23 0000  For home use only DME oxygen       Question Answer Comment  Length of Need 6 Months   Mode or (Route) Nasal cannula   Oxygen delivery system Gas      05/05/23 1356

## 2023-05-05 NOTE — Progress Notes (Signed)
HD#0 SUBJECTIVE:  Patient Summary: Amy Villa is a 57 y.o. with a PMH of COPD, coming in with worsening SOB, increased sputum production and change in color admitted for COPD exacerbation.  Overnight Events: NAEO  Interim History: Feels improved today but has phlegm that she is unable to expectorate at this time. Was at RA earlier today but then desatted below 88% and required oxygen again.  OBJECTIVE:  Vital Signs: Vitals:   05/05/23 0723 05/05/23 0725 05/05/23 0734 05/05/23 1236  BP:   135/88 125/68  Pulse:   (!) 59 60  Resp:   17   Temp:   97.9 F (36.6 C)   TempSrc:   Oral   SpO2: 94% 95% 100% 93%  Weight:      Height:       Supplemental O2: Nasal Cannula SpO2: 93 % O2 Flow Rate (L/min): 3 L/min  Filed Weights   05/03/23 1601  Weight: 60 kg    No intake or output data in the 24 hours ending 05/05/23 1308  Net IO Since Admission: 617 mL [05/05/23 1308]  Physical Exam: Physical Exam Constitutional:      General: She is not in acute distress.    Appearance: She is not ill-appearing or toxic-appearing.  Cardiovascular:     Rate and Rhythm: Normal rate and regular rhythm.     Heart sounds: No murmur heard.    No friction rub. No gallop.  Pulmonary:     Effort: Pulmonary effort is normal. No respiratory distress.     Breath sounds: Examination of the left-middle field reveals wheezing. Examination of the left-lower field reveals wheezing. Wheezing present. No decreased breath sounds, rhonchi or rales.  Abdominal:     General: Bowel sounds are normal.     Palpations: Abdomen is soft.     Tenderness: There is no abdominal tenderness.  Musculoskeletal:     Right lower leg: No edema.     Left lower leg: No edema.  Neurological:     Mental Status: She is alert.   Patient Lines/Drains/Airways Status     Active Line/Drains/Airways     Name Placement date Placement time Site Days   Peripheral IV 04/29/22 22 G 1" Posterior;Right Forearm 04/29/22  1946   Forearm  370   Peripheral IV 05/03/23 18 G 1" Left Antecubital 05/03/23  1605  Antecubital  1   Wound / Incision (Open or Dehisced) 09/16/19 Laceration Head Posterior 10 cm occipital laceration with staples 09/16/19  2135  Head  1326             ASSESSMENT/PLAN:  Assessment: Principal Problem:   COPD exacerbation (HCC) Active Problems:   Liver cirrhosis (HCC)   Anxiety and depression   Acute respiratory failure with hypoxia (HCC)  Amy Villa is a 57 y.o. with a PMH of COPD, coming in with worsening SOB, increased sputum production and change in color admitted for COPD exacerbation.  Plan:  #Acute hypoxic respiratory failure #COPD exacerbation Currently requiring about 3L of O2 with ambulation. Was at RA earlier but then desatted. nImproved with LABA+LAMA+ICS. Did not receive Duoneb, will restart.   -S/p Duoneb x 2, solumedrol in ED with improvement of dyspnea -cw Aformoterol nebulizer BID -cw Revefenacin nebulizer daily -cw Pulmicort nebulizer BID -cw Prednisone 40mg  daily -cw Azithromycin 250mg  daily -cw Duoneb q4hrs PRN  -Mucinex for expectoration    #MDD #Borderline personality disorder #GAD Follows with [facility] psychiatry. Continue with home meds: - Cymbalta 90 mg daily for depression/anxiety -  Trazodone 100 mg at bedtime for sleep  - Trileptal 300 mg daily - Hydroxyzine 50 mg TID for anxiety - Seroquel 400 mg at bedtime - Propranolol to 10 mg qAM, 10 mg at noon, and 20 mg QHS - Gabapentin 300 mg BID   #Cirrhosis Well-compensated, continue home lactulose  #Nodule on CT 04/2022 Noted in October 2023. Required follow-up at three months. CT scan done in January 2024 at [facility] states no focal pulmonary nodules or infiltrates. Will recommend follow up at least yearly with imaging given that she has a smoking history and will need surveillance.   Diet: Regular VTE: Lovenox IVF: None Code: None   Prior to Admission Living Arrangement: Home Anticipated  Discharge Location: Home Barriers to Discharge: Pending medical stability  Signature: [facility]  Internal Medicine Resident, PGY-1 [facility]  Pager: [phone] 1:08 PM, 05/05/2023   Please contact the on call pager after 5 pm and on weekends at [phone].

## 2023-05-05 NOTE — Progress Notes (Signed)
Pt's O2 sats 94% 2L. Decreased to 1L for an hour but pt's O2 sat dropped to 86%. Increased to 2L and pt maintained above 90%.

## 2023-05-05 NOTE — Plan of Care (Signed)

## 2023-05-05 NOTE — Progress Notes (Addendum)
Transition of Care [facility]) - Inpatient Brief Assessment   Patient Details  Name: Amy Villa MRN: [id number] Date of Birth: [birth year]  Transition of Care [facility]) CM/SW Contact:    Janae Bridgeman, RN Phone Number: 05/05/2023, 10:29 AM   Clinical Narrative: Patient admitted to the hospital with COPD exacerbation and is currently on 1L/min [place] oxygen.  No TOC needs at this time but CM will continue to follow the patient.   05/05/23 1400 - CM met with the patient at the bedside to discuss TOC needs for oxygen at home.  Patient was provided with DME choice and patient did not have a preference.  I called [employer] company and they will follow up to deliver portable oxygen tank to the room and concentrator at the home.  Patient will likely discharge tomorrow to home once medically stable for discharge.   Transition of Care Asessment: Insurance and Status: (P) Insurance coverage has been reviewed Patient has primary care physician: (P) Yes Home environment has been reviewed: (P) From home with spouse Prior level of function:: (P) Independent Prior/Current Home Services: (P) No current home services Social Determinants of Health Reivew: (P) SDOH reviewed needs interventions Readmission risk has been reviewed: (P) Yes Transition of care needs: (P) transition of care needs identified, TOC will continue to follow

## 2023-05-06 ENCOUNTER — Other Ambulatory Visit (HOSPITAL_COMMUNITY): Payer: Self-pay

## 2023-05-06 DIAGNOSIS — J441 Chronic obstructive pulmonary disease with (acute) exacerbation: Secondary | ICD-10-CM | POA: Diagnosis not present

## 2023-05-06 DIAGNOSIS — J9601 Acute respiratory failure with hypoxia: Secondary | ICD-10-CM | POA: Diagnosis not present

## 2023-05-06 LAB — CBC
HCT: 39.2 % (ref 36.0–46.0)
Hemoglobin: 13.1 g/dL (ref 12.0–15.0)
MCH: 31 pg (ref 26.0–34.0)
MCHC: 33.4 g/dL (ref 30.0–36.0)
MCV: 92.9 fL (ref 80.0–100.0)
Platelets: 145 10*3/uL — ABNORMAL LOW (ref 150–400)
RBC: 4.22 MIL/uL (ref 3.87–5.11)
RDW: 13.7 % (ref 11.5–15.5)
WBC: 8.6 10*3/uL (ref 4.0–10.5)
nRBC: 0 % (ref 0.0–0.2)

## 2023-05-06 LAB — RENAL FUNCTION PANEL
Albumin: 3.6 g/dL (ref 3.5–5.0)
Anion gap: 10 (ref 5–15)
BUN: 10 mg/dL (ref 6–20)
CO2: 28 mmol/L (ref 22–32)
Calcium: 9.1 mg/dL (ref 8.9–10.3)
Chloride: 99 mmol/L (ref 98–111)
Creatinine, Ser: 0.81 mg/dL (ref 0.44–1.00)
GFR, Estimated: >60 mL/min (ref >60)
Glucose, Bld: 113 mg/dL — ABNORMAL HIGH (ref 70–99)
Phosphorus: 3.5 mg/dL (ref 2.5–4.6)
Potassium: 3.3 mmol/L — ABNORMAL LOW (ref 3.5–5.1)
Sodium: 137 mmol/L (ref 135–145)

## 2023-05-06 LAB — MAGNESIUM: Magnesium: 1.8 mg/dL (ref 1.7–2.4)

## 2023-05-06 MED ORDER — FLUTICASONE PROPIONATE 50 MCG/ACT NA SUSP
1.0000 | Freq: Every day | NASAL | 0 refills | Status: DC
Start: 2023-05-07 — End: 2023-05-06
  Filled 2023-05-06: qty 16, 60d supply, fill #0

## 2023-05-06 MED ORDER — PROPRANOLOL HCL 20 MG PO TABS
20.0000 mg | ORAL_TABLET | Freq: Every day | ORAL | 0 refills | Status: DC
  Filled 2023-05-06: qty 30, 30d supply, fill #0

## 2023-05-06 MED ORDER — PREDNISONE 20 MG PO TABS
40.0000 mg | ORAL_TABLET | Freq: Every day | ORAL | 0 refills | Status: DC
  Filled 2023-05-06: qty 2, 1d supply, fill #0

## 2023-05-06 MED ORDER — AZITHROMYCIN 250 MG PO TABS
250.0000 mg | ORAL_TABLET | Freq: Every day | ORAL | 0 refills | Status: DC
  Filled 2023-05-06: qty 1, 1d supply, fill #0

## 2023-05-06 MED ORDER — DULOXETINE HCL 30 MG PO CPEP
90.0000 mg | ORAL_CAPSULE | Freq: Every day | ORAL | 0 refills | Status: DC
  Filled 2023-05-06 (×2): qty 90, 30d supply, fill #0

## 2023-05-06 MED ORDER — OXCARBAZEPINE 300 MG PO TABS
300.0000 mg | ORAL_TABLET | Freq: Every day | ORAL | 0 refills | Status: DC
  Filled 2023-05-06: qty 30, 30d supply, fill #0

## 2023-05-06 MED ORDER — PROPRANOLOL HCL 10 MG PO TABS
10.0000 mg | ORAL_TABLET | ORAL | 0 refills | Status: AC
  Filled 2023-05-06: qty 30, 30d supply, fill #0
  Filled 2023-05-06: qty 60, 30d supply, fill #0

## 2023-05-06 MED ORDER — QUETIAPINE FUMARATE 400 MG PO TABS
400.0000 mg | ORAL_TABLET | Freq: Every day | ORAL | 0 refills | Status: DC
Start: 2023-05-06 — End: 2023-05-06
  Filled 2023-05-06: qty 30, 30d supply, fill #0

## 2023-05-06 MED ORDER — GUAIFENESIN ER 600 MG PO TB12
600.0000 mg | ORAL_TABLET | Freq: Two times a day (BID) | ORAL | 0 refills | Status: DC | PRN
  Filled 2023-05-06: qty 15, 8d supply, fill #0

## 2023-05-06 MED ORDER — PANTOPRAZOLE SODIUM 40 MG PO TBEC
40.0000 mg | DELAYED_RELEASE_TABLET | Freq: Every day | ORAL | 0 refills | Status: DC
  Filled 2023-05-06: qty 30, 30d supply, fill #0

## 2023-05-06 MED ORDER — PULSE OXIMETER FOR FINGER MISC
1.0000 [IU] | Freq: Every day | 0 refills | Status: AC | PRN
  Filled 2023-05-06: qty 1, fill #0

## 2023-05-06 MED ORDER — HYDROXYZINE HCL 25 MG PO TABS
50.0000 mg | ORAL_TABLET | Freq: Three times a day (TID) | ORAL | 0 refills | Status: DC | PRN
  Filled 2023-05-06: qty 60, 10d supply, fill #0

## 2023-05-06 MED ORDER — PROPRANOLOL HCL 10 MG PO TABS
10.0000 mg | ORAL_TABLET | Freq: Every day | ORAL | 0 refills | Status: DC
  Filled 2023-05-06: qty 30, 30d supply, fill #0

## 2023-05-06 MED ORDER — POTASSIUM CHLORIDE CRYS ER 20 MEQ PO TBCR
40.0000 meq | EXTENDED_RELEASE_TABLET | Freq: Two times a day (BID) | ORAL | Status: DC
  Administered 2023-05-06: 40 meq via ORAL
  Filled 2023-05-06: qty 2

## 2023-05-06 MED ORDER — LACTULOSE 10 GM/15ML PO SOLN
20.0000 g | Freq: Every day | ORAL | 0 refills | Status: DC | PRN
  Filled 2023-05-06: qty 237, 8d supply, fill #0

## 2023-05-06 NOTE — Progress Notes (Signed)
   05/06/23 0900  Chest Physiotherapy Tx  CPT Delivery Source Flutter valve  $ Flutter Blue Yes  CPT Duration 10  CPT Chest Site Full range  Post-Treatment Respirations 20  Cough Non-productive;Congested;Strong  Position Supine  CPT Treatment Tolerance Tolerated well

## 2023-05-06 NOTE — Progress Notes (Signed)
Repeat walking test. Pt O2 sat started with 96% RA. Ambulating, 90% O2 sat. Recovered to 95% RA after exertion at EOB.

## 2023-05-06 NOTE — Discharge Summary (Addendum)
Name: Amy Villa MRN: [id number] DOB: 27-Aug-1965 57 y.o. PCP: Amy Kill, PA-C  Date of Admission: 05/03/2023  3:26 PM Date of Discharge: 05/06/2023  Attending Physician: Dr.  Sol Blazing  DISCHARGE DIAGNOSIS:  Primary Problem: COPD exacerbation [facility])   Hospital Problems: Principal Problem:   COPD exacerbation (HCC) Active Problems:   Liver cirrhosis (HCC)   Anxiety and depression   Acute respiratory failure with hypoxia (HCC)   COPD with exacerbation (HCC)    DISCHARGE MEDICATIONS:   Allergies as of 05/06/2023       Reactions   Hydroxyamine Other (See Comments)   Not effective   Lyrica [pregabalin] Other (See Comments)   Angry, upset       ADDENDUM: It was noted after the patient's discharge that these medications erroneously were marked as stop taking the medications. I have called and messaged the patient to let her know what medications she needs to take, and advised patient to follow up with PCP after discharge and make sure she is taking her medications correctly.   She should continue taking hydroxyzine, lactulose, oxcarbazepine, pantoprazole, quietapine as well as the other ones below below the  "Take these medications" section.    Medication List     STOP taking these medications    hydrOXYzine 25 MG tablet Commonly known as: ATARAX   lactulose 10 GM/15ML solution Commonly known as: CHRONULAC   Oxcarbazepine 300 MG tablet Commonly known as: TRILEPTAL   pantoprazole 40 MG tablet Commonly known as: PROTONIX   QUEtiapine 200 MG tablet Commonly known as: SEROQUEL   QUEtiapine 50 MG tablet Commonly known as: SEROQUEL       TAKE these medications    betamethasone dipropionate 0.05 % lotion Apply 1 Application topically as needed for itching.   DULoxetine 30 MG capsule Commonly known as: CYMBALTA Take 3 capsules (90 mg total) by mouth daily. Start taking on: May 07, 2023 What changed: how much to take   folic acid 1 MG  tablet Commonly known as: FOLVITE Take 1 tablet (1 mg total) by mouth daily.   gabapentin 300 MG capsule Commonly known as: NEURONTIN Take 1 capsule (300 mg total) by mouth 2 (two) times daily. What changed: when to take this   naproxen sodium 220 MG tablet Commonly known as: ALEVE Take 220 mg by mouth 2 (two) times daily as needed (Headaches).   propranolol 10 MG tablet Commonly known as: INDERAL Take 1 tablet by mouth daily at 8am and 1 tablet daily before lunch Start taking on: May 07, 2023 What changed: when to take this   Pulse Oximeter For Finger Misc 1 Units by Does not apply route daily as needed.   traZODone 100 MG tablet Commonly known as: DESYREL Take 100 mg by mouth at bedtime.               Durable Medical Equipment  (From admission, onward)           Start     Ordered   05/05/23 1435  For home use only DME oxygen  Once       Question Answer Comment  Length of Need 6 Months   Mode or (Route) Nasal cannula   Liters per Minute 2   Frequency Continuous (stationary and portable oxygen unit needed)   Oxygen conserving device Yes   Oxygen delivery system Gas      05/05/23 1435   05/05/23 0000  For home use only DME oxygen  Question Answer Comment  Length of Need 6 Months   Mode or (Route) Nasal cannula   Oxygen delivery system Gas      05/05/23 1356            DISPOSITION AND FOLLOW-UP:  Amy Villa was discharged from [facility] in Good condition. At the hospital follow up visit please address:  Follow-up Recommendations: Labs: Basic Metabolic Profile and CBC Medications: Wean off O2 as needed. Started Trelegy.  Follow-up Appointments:  Follow-up Information     Amy Kill, PA-C. Schedule an appointment as soon as possible for a visit today.   Specialty: Physician Assistant Contact information: [address] [place] [address] [place] [address] [phone]                  HOSPITAL COURSE:  Patient Summary: Amy Villa is a 57 y.o. with a PMH for COPD, cirrhosis 2/2 alcohol use, and mood disorders who presented with dyspnea and productive cough for the last 6 weeks. She was previously on Symbicort but was not able to afford it. She was using her albuterol about 8 times a day. Had received Rocephin (1 x) and Augmentin for COPD last month. A CXR was done which did not show any acute findings. At the ED she was hypertensive at 159/108, satting  91% with 3L of O2, alert and afebrile. She had wheezing in all lung fields. A respiratory panel was done will was negative for Covid, Influenza, and RSV. CXR did not show any acute cardiopulmonary findings. She improved with Duoneb x 2 and solumedrol. Labs were significant for eosinophilia at 600. WBC WNL. She was also hypokalemic at 2.6 which corrected after potassium supplementation. She was started on a LABA+LAMA+ICS. She did report some green sputum, and was started on azithromycin and prednisone. She was also placed on duonebs q4PRN. Patient improved O2 saturations with the addition of DuoNeb in addition to her triple therapy, prednisone, and azithromycin her last day. She does not have nebulizer machine at home. She is going home with Oxygen and an oximeter to maintain oxygenation at 88-92% RA.  Start Trelegy once daily  Use albuterol as needed  Continue with azithromycin 250mg  (today is day 3/5) Continue with Prednisone 40mg  (day 3/5) Consider FU with pulmonology to get formal PFTs  Consider weaning off O2 outpatient.  Mood disorder Has a history of MDD, borderline personality and GAD. She recently followed up with them this year and the following were recommended:  - Cymbalta 90 mg daily for depression/anxiety - Trazodone 100 mg at bedtime for sleep  - Trileptal 300 mg daily - Hydroxyzine 50 mg TID for anxiety - Seroquel 400 mg at bedtime - Propranolol to 10 mg qAM, 10 mg at noon, and 20 mg at  bedtime  Neuropathy  She is to continue on  Gabapentin 300 mg BID   Cirrhosis She is well compensated and should continue on her home lactulose.   Hypokalemia  Came in with a K of 2.6. Was 3.3 today. This was repleated. Please follow up with a BMP   DISCHARGE INSTRUCTIONS:   Thank you for allowing [place] to take care of you. You were admitted to the hospital because you had worsening shortness of breath and treated for an acute COPD exacerbation.   You let us know that you could not afford your other inhalers so you were using the albuterol.   You will need to take an additional inhaler for COPD, regardless if you  feel short of breath or not. This inhaler is called Trelegy Please make sure to use this once daily.  You can use albuterol at times where you feel your shortness of breath is worsening for immediate relief.   You are going home on Oxygen TEMPORARILY. Please follow up with your PCP within a week of this hospitalization to determine if you will need oxygen for longer. We are giving you a pulse oximeter to measure your oxygen needs. Your oxygen saturation should go on 88-92%.   You will need to take azithromycin tonight for one more dose.  You will need to take Prednisone for two more days (you already got your dose for today)  Please make sure you follow up with your primary care provider after this hospitalization asap.   Sincerely,  Manuela Neptune, MD  PGY-1    Discharge Instructions     Call MD for:  difficulty breathing, headache or visual disturbances   Complete by: As directed    Call MD for:  extreme fatigue   Complete by: As directed    Call MD for:  hives   Complete by: As directed    Call MD for:  persistant dizziness or light-headedness   Complete by: As directed    Call MD for:  persistant nausea and vomiting   Complete by: As directed    Call MD for:  redness, tenderness, or signs of infection (pain, swelling, redness, odor or green/yellow  discharge around incision site)   Complete by: As directed    Call MD for:  severe uncontrolled pain   Complete by: As directed    Call MD for:  temperature >100.4   Complete by: As directed    Diet - low sodium heart healthy   Complete by: As directed    For home use only DME oxygen   Complete by: As directed    Length of Need: 6 Months   Mode or (Route): Nasal cannula   Oxygen delivery system: Gas   Increase activity slowly   Complete by: As directed        SUBJECTIVE:   Feels way better today. Breathing better. Slept well.   Discharge Vitals:   BP (!) 150/89 (BP Location: Right Arm)   Pulse 67   Temp 98.4 F (36.9 C) (Oral)   Resp 17   Ht 5\' 3"  (1.6 m)   Wt 60 kg   SpO2 92%   BMI 23.43 kg/m   OBJECTIVE:  Physical Exam Constitutional:      General: She is not in acute distress.    Appearance: She is not ill-appearing or toxic-appearing.  Neck:     Vascular: No JVD.  Cardiovascular:     Rate and Rhythm: Normal rate and regular rhythm.     Heart sounds: No murmur heard.    No friction rub. No gallop.  Pulmonary:     Breath sounds: Normal breath sounds. No decreased breath sounds, wheezing, rhonchi or rales.  Abdominal:     General: Bowel sounds are normal.     Palpations: Abdomen is soft. There is no mass.     Tenderness: There is no abdominal tenderness. There is no rebound.  Musculoskeletal:     Right lower leg: No edema.     Left lower leg: No edema.  Skin:    Coloration: Skin is not cyanotic.     Pertinent Labs, Studies, and Procedures:     Latest Ref Rng & Units 05/06/2023    5:45  AM 05/05/2023    4:58 AM 05/04/2023    5:05 AM  CBC  WBC 4.0 - 10.5 K/uL 8.6  11.0  8.6   Hemoglobin 12.0 - 15.0 g/dL 16.1  09.6  04.5   Hematocrit 36.0 - 46.0 % 39.2  37.9  40.5   Platelets 150 - 400 K/uL 145  151  158        Latest Ref Rng & Units 05/06/2023    5:45 AM 05/05/2023    4:58 AM 05/04/2023    5:05 AM  CMP  Glucose 70 - 99 mg/dL 409  811  914    BUN 6 - 20 mg/dL 10  11  9    Creatinine 0.44 - 1.00 mg/dL 7.82  9.56  2.13   Sodium 135 - 145 mmol/L 137  137  137   Potassium 3.5 - 5.1 mmol/L 3.3  4.3  4.1   Chloride 98 - 111 mmol/L 99  96  100   CO2 22 - 32 mmol/L 28  29  27    Calcium 8.9 - 10.3 mg/dL 9.1  9.8  9.6     DG Chest 2 View  Addendum Date: 05/04/2023   ADDENDUM REPORT: 05/04/2023 08:09 ADDENDUM: Previously described nodular opacity is not definitively seen on today's examination. Per previous recommendation, follow-up CT of the chest in 3 months can be obtained to ensure resolution. Electronically Signed   By: Duanne Guess D.O.   On: 05/04/2023 08:09   Result Date: 05/04/2023 CLINICAL DATA:  Cough EXAM: CHEST - 2 VIEW COMPARISON:  04/30/2022 FINDINGS: Multiple cardiac leads overlie the chest. The heart size and mediastinal contours are within normal limits. No focal airspace consolidation, pleural effusion, or pneumothorax. Chronic sternal fracture. IMPRESSION: No active cardiopulmonary disease. Electronically Signed: By: Duanne Guess D.O. On: 05/03/2023 19:41     Signed: Manuela Neptune, MD Internal Medicine Resident, PGY-1 [facility]  Pager: [phone] 7:19 PM, 05/06/2023

## 2023-05-06 NOTE — Plan of Care (Signed)
  Problem: Education: Goal: Knowledge of General Education information will improve Description: Including pain rating scale, medication(s)/side effects and non-pharmacologic comfort measures Outcome: Adequate for Discharge   Problem: Health Behavior/Discharge Planning: Goal: Ability to manage health-related needs will improve Outcome: Adequate for Discharge   Problem: Clinical Measurements: Goal: Ability to maintain clinical measurements within normal limits will improve Outcome: Adequate for Discharge Goal: Will remain free from infection Outcome: Adequate for Discharge Goal: Diagnostic test results will improve Outcome: Adequate for Discharge Goal: Respiratory complications will improve Outcome: Adequate for Discharge Goal: Cardiovascular complication will be avoided Outcome: Adequate for Discharge   Problem: Activity: Goal: Risk for activity intolerance will decrease Outcome: Adequate for Discharge   Problem: Nutrition: Goal: Adequate nutrition will be maintained Outcome: Adequate for Discharge   Problem: Coping: Goal: Level of anxiety will decrease Outcome: Adequate for Discharge   Problem: Elimination: Goal: Will not experience complications related to bowel motility Outcome: Adequate for Discharge Goal: Will not experience complications related to urinary retention Outcome: Adequate for Discharge   Problem: Pain Managment: Goal: General experience of comfort will improve Outcome: Adequate for Discharge   Problem: Safety: Goal: Ability to remain free from injury will improve Outcome: Adequate for Discharge   Problem: Skin Integrity: Goal: Risk for impaired skin integrity will decrease Outcome: Adequate for Discharge   Problem: Education: Goal: Understanding of post-operative needs will improve Outcome: Adequate for Discharge Goal: Individualized Educational Video(s) Outcome: Adequate for Discharge   Problem: Clinical Measurements: Goal: Postoperative  complications will be avoided or minimized Outcome: Adequate for Discharge   Problem: Respiratory: Goal: Will regain and/or maintain adequate ventilation Outcome: Adequate for Discharge

## 2023-05-06 NOTE — Discharge Instructions (Addendum)
Thank you for allowing [place] to take care of you. You were admitted to the hospital because you had worsening shortness of breath and treated for an acute COPD exacerbation.   You let us know that you could not afford your other inhalers so you were using the albuterol.   You will need to take an additional inhaler for COPD, regardless if you feel short of breath or not. This inhaler is called Trelegy Please make sure to use this once daily.  You can use albuterol at times where you feel your shortness of breath is worsening for immediate relief.   You are going home on Oxygen TEMPORARILY. Please follow up with your PCP within a week of this hospitalization to determine if you will need oxygen for longer. We are giving you a pulse oximeter to measure your oxygen needs. Your oxygen saturation should go on 88-92%.   You will need to take azithromycin tonight for one more dose.  You will need to take Prednisone for two more days (you already got your dose for today)  Please make sure you follow up with your primary care provider after this hospitalization asap.   Sincerely,  Manuela Neptune, MD  PGY-1

## 2023-05-06 NOTE — Plan of Care (Signed)
  Problem: Clinical Measurements: Goal: Ability to maintain clinical measurements within normal limits will improve Outcome: Progressing Goal: Will remain free from infection Outcome: Progressing Goal: Diagnostic test results will improve Outcome: Progressing   Problem: Activity: Goal: Risk for activity intolerance will decrease Outcome: Progressing   Problem: Nutrition: Goal: Adequate nutrition will be maintained Outcome: Progressing   Problem: Coping: Goal: Level of anxiety will decrease Outcome: Progressing

## 2023-05-06 NOTE — Progress Notes (Addendum)
Before ambulating, pt's O2 sat 100% RA. Upon ambulation on RA, pt's O2 dropped to 86%. Placed 2L [place] and pt's O2 sat increased to 94% ambulating. After exertion and sitting EOB for 2 minutes, pt's O2 sat 95% RA.

## 2023-05-07 ENCOUNTER — Other Ambulatory Visit: Payer: Self-pay | Admitting: Internal Medicine

## 2023-05-07 ENCOUNTER — Encounter: Payer: Self-pay | Admitting: Student

## 2023-05-07 DIAGNOSIS — J441 Chronic obstructive pulmonary disease with (acute) exacerbation: Secondary | ICD-10-CM

## 2023-05-07 DIAGNOSIS — J449 Chronic obstructive pulmonary disease, unspecified: Secondary | ICD-10-CM

## 2023-05-07 MED ORDER — DULOXETINE HCL 30 MG PO CPEP: 90.0000 mg | ORAL_CAPSULE | Freq: Every day | ORAL | 0 refills | Status: AC

## 2023-05-07 MED ORDER — HYDROXYZINE HCL 50 MG PO TABS: 50.0000 mg | ORAL_TABLET | Freq: Three times a day (TID) | ORAL | 0 refills | Status: AC | PRN

## 2023-05-07 MED ORDER — OXCARBAZEPINE 300 MG PO TABS: 300.0000 mg | ORAL_TABLET | Freq: Every day | ORAL | 0 refills | Status: AC

## 2023-05-07 MED ORDER — QUETIAPINE FUMARATE 200 MG PO TABS: 400.0000 mg | ORAL_TABLET | Freq: Every day | ORAL | 0 refills | Status: AC

## 2023-05-07 MED ORDER — FLUTICASONE PROPIONATE 50 MCG/ACT NA SUSP: 1.0000 | Freq: Every day | NASAL | 0 refills | Status: AC

## 2023-05-07 MED ORDER — LACTULOSE 20 GM/30ML PO SOLN: 30.0000 mL | Freq: Every day | ORAL | 0 refills | Status: AC | PRN

## 2023-05-07 MED ORDER — AZITHROMYCIN 250 MG PO TABS: ORAL_TABLET | ORAL | 0 refills | Status: AC

## 2023-05-07 MED ORDER — TRELEGY ELLIPTA 100-62.5-25 MCG/ACT IN AEPB: 1.0000 | INHALATION_SPRAY | Freq: Every day | RESPIRATORY_TRACT | 0 refills | Status: AC

## 2023-05-07 MED ORDER — PANTOPRAZOLE SODIUM 40 MG PO TBEC: 40.0000 mg | DELAYED_RELEASE_TABLET | Freq: Every day | ORAL | 0 refills | Status: AC

## 2023-05-08 NOTE — Plan of Care (Signed)
[facility] Tonsillectomy/Adenoidectomy, Postoperative PEDS care plan entered in error.

## 2023-05-09 ENCOUNTER — Other Ambulatory Visit: Payer: Self-pay | Admitting: Internal Medicine

## 2023-05-09 DIAGNOSIS — J449 Chronic obstructive pulmonary disease, unspecified: Secondary | ICD-10-CM

## 2023-05-21 ENCOUNTER — Other Ambulatory Visit: Payer: Self-pay | Admitting: Internal Medicine

## 2023-06-05 ENCOUNTER — Other Ambulatory Visit: Payer: Self-pay | Admitting: Internal Medicine

## 2023-06-07 ENCOUNTER — Other Ambulatory Visit: Payer: Self-pay | Admitting: Internal Medicine

## 2023-09-06 ENCOUNTER — Other Ambulatory Visit: Payer: Self-pay | Admitting: Internal Medicine

## 2023-10-13 IMAGING — DX DG CHEST 2V
2 series · 2 of 2 positions shown · non-contrast
Comparison: 11/11/2019 chest radiograph.

CLINICAL DATA: Wheezing, dyspnea

EXAM:
CHEST - 2 VIEW

[chest pa]
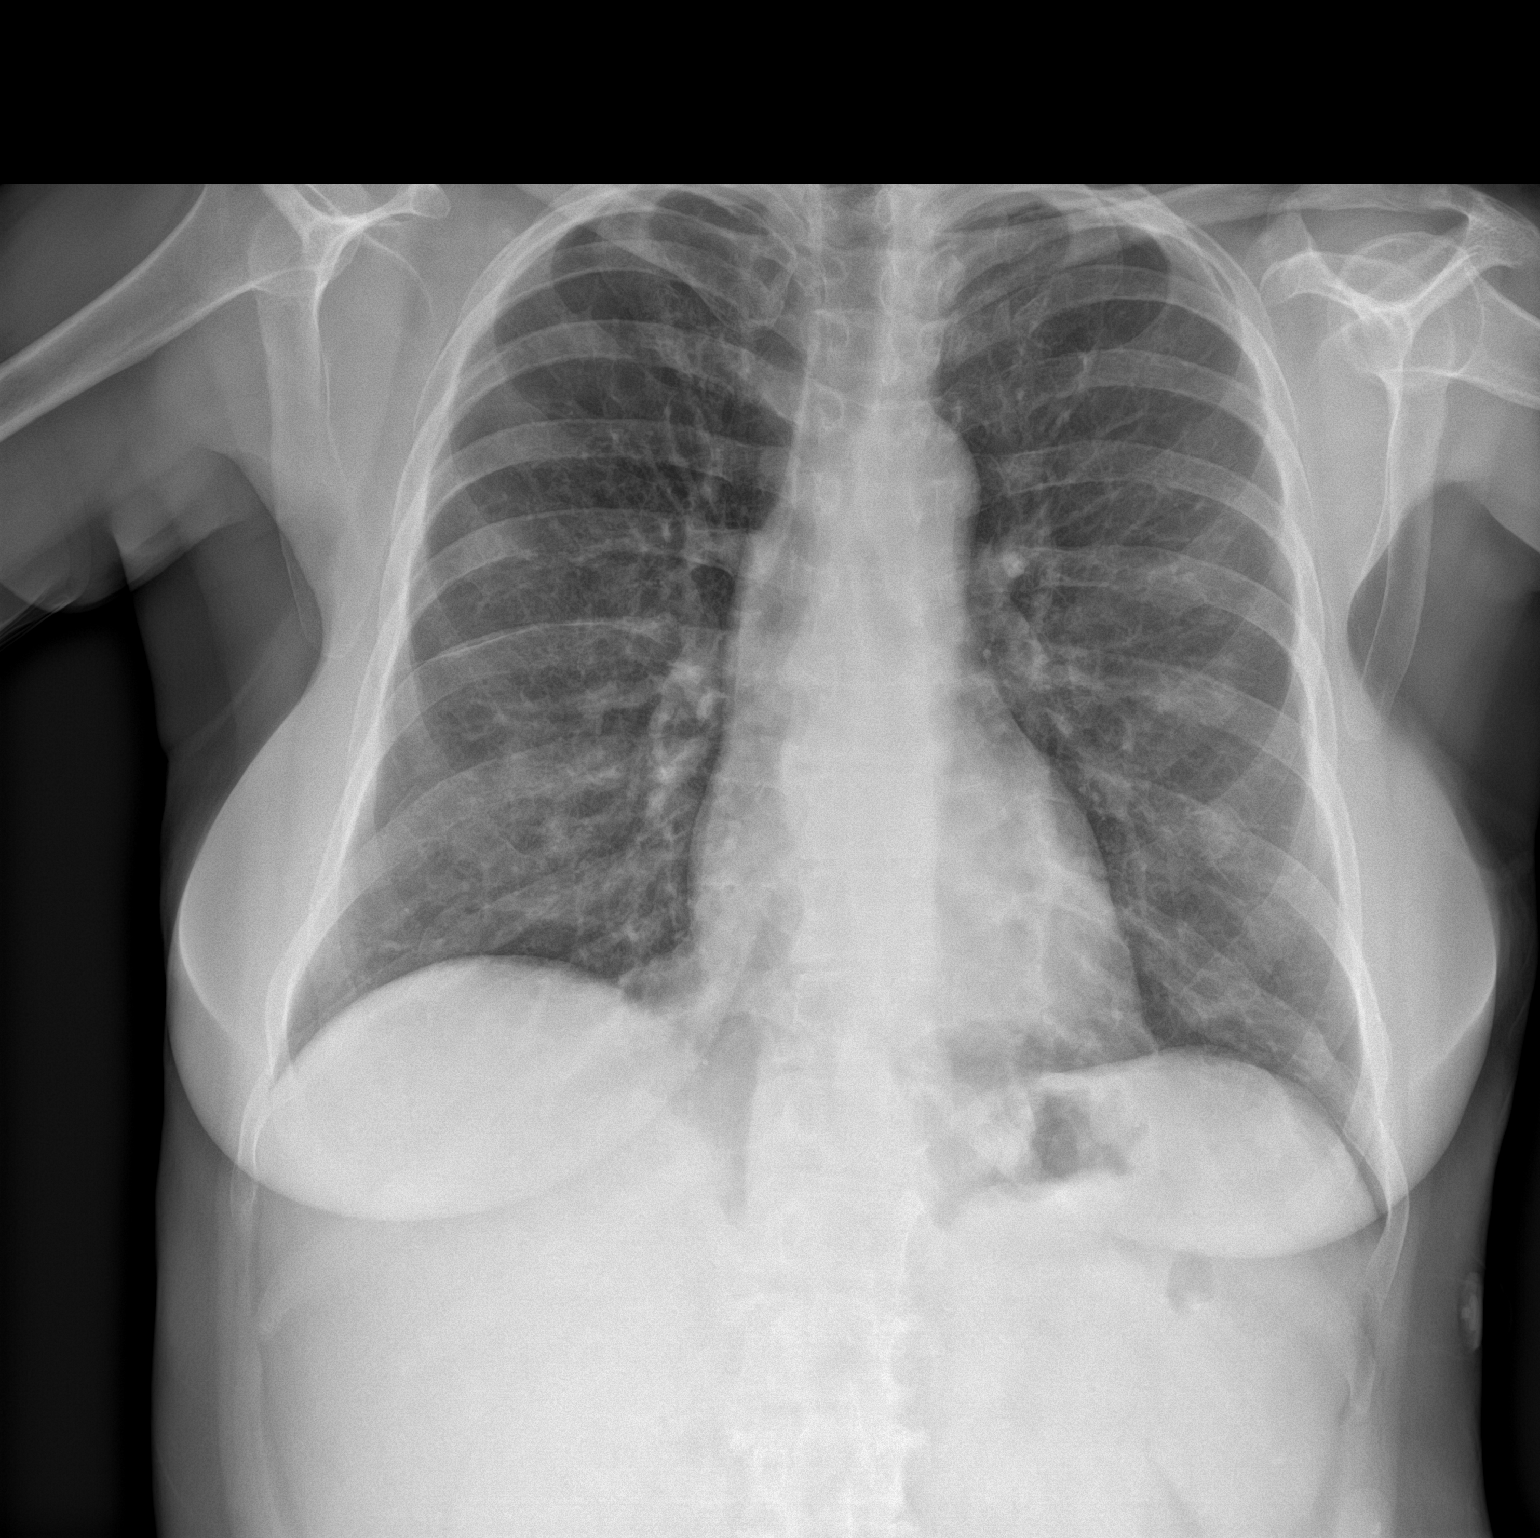

[chest lat]
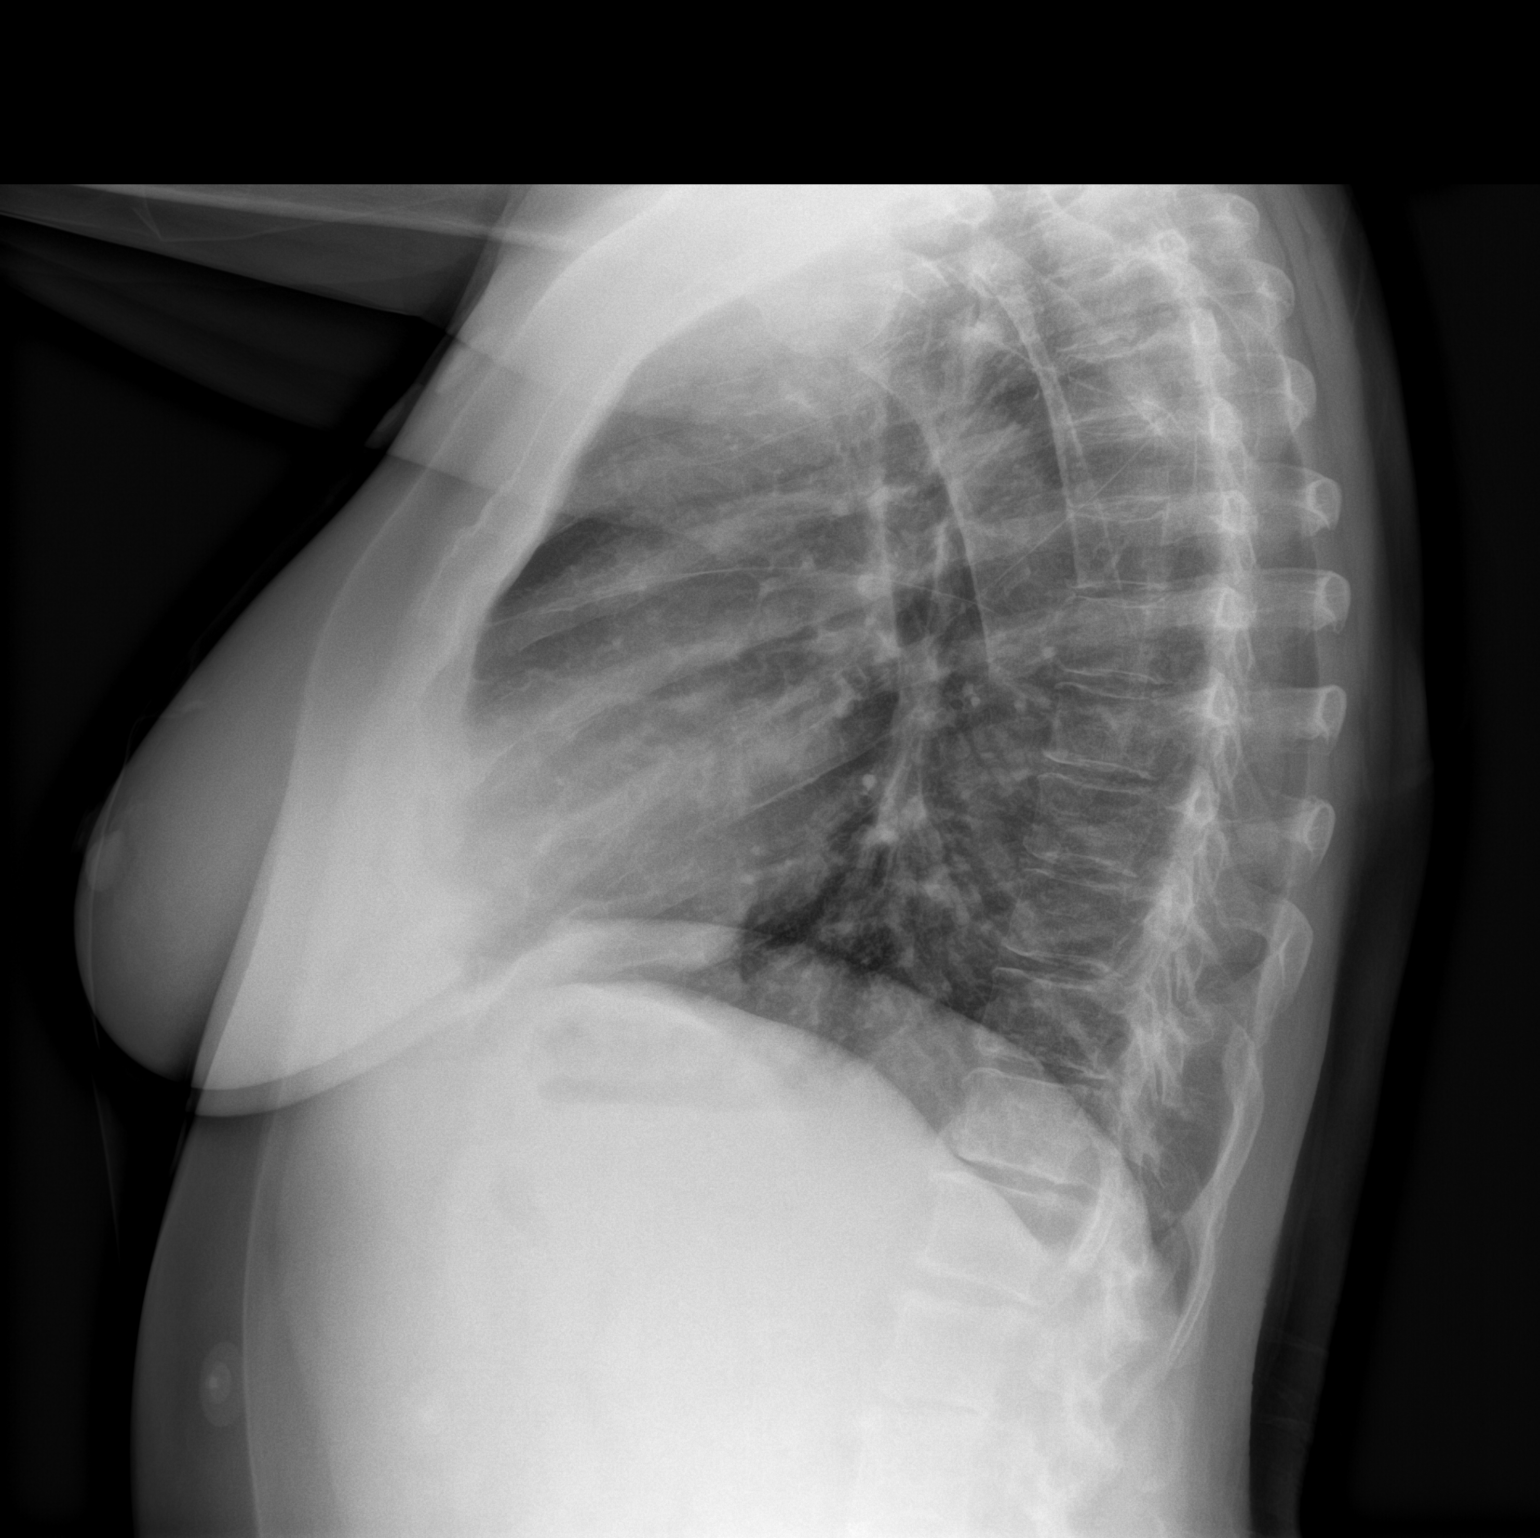

[2 of 2 positions shown; findings below may reference images not displayed]

FINDINGS: Stable cardiomediastinal silhouette with normal heart size. No
pneumothorax. No pleural effusion. Lungs appear clear, with no acute
consolidative airspace disease and no pulmonary edema.
IMPRESSION: No active cardiopulmonary disease.

## 2023-12-21 ENCOUNTER — Emergency Department (HOSPITAL_COMMUNITY)

## 2023-12-21 ENCOUNTER — Encounter (HOSPITAL_COMMUNITY): Payer: Self-pay | Admitting: Emergency Medicine

## 2023-12-21 ENCOUNTER — Other Ambulatory Visit: Payer: Self-pay

## 2023-12-21 ENCOUNTER — Emergency Department (HOSPITAL_COMMUNITY)
Admission: EM | Admit: 2023-12-21 | Discharge: 2023-12-21 | Disposition: A | Discharge status: Home / Self Care | Attending: Emergency Medicine | Admitting: Emergency Medicine

## 2023-12-21 DIAGNOSIS — J449 Chronic obstructive pulmonary disease, unspecified: Secondary | ICD-10-CM | POA: Diagnosis not present

## 2023-12-21 DIAGNOSIS — R0600 Dyspnea, unspecified: Secondary | ICD-10-CM | POA: Diagnosis not present

## 2023-12-21 DIAGNOSIS — Z7951 Long term (current) use of inhaled steroids: Secondary | ICD-10-CM | POA: Diagnosis not present

## 2023-12-21 DIAGNOSIS — R059 Cough, unspecified: Secondary | ICD-10-CM | POA: Diagnosis present

## 2023-12-21 DIAGNOSIS — Z7952 Long term (current) use of systemic steroids: Secondary | ICD-10-CM | POA: Diagnosis not present

## 2023-12-21 LAB — CBC
HCT: 43 % (ref 36.0–46.0)
Hemoglobin: 13.9 g/dL (ref 12.0–15.0)
MCH: 29.4 pg (ref 26.0–34.0)
MCHC: 32.3 g/dL (ref 30.0–36.0)
MCV: 90.9 fL (ref 80.0–100.0)
Platelets: 180 10*3/uL (ref 150–400)
RBC: 4.73 MIL/uL (ref 3.87–5.11)
RDW: 14.5 % (ref 11.5–15.5)
WBC: 8 10*3/uL (ref 4.0–10.5)
nRBC: 0 % (ref 0.0–0.2)

## 2023-12-21 LAB — BASIC METABOLIC PANEL WITH GFR
Anion gap: 16 — ABNORMAL HIGH (ref 5–15)
BUN: 6 mg/dL (ref 6–20)
CO2: 22 mmol/L (ref 22–32)
Calcium: 9.3 mg/dL (ref 8.9–10.3)
Chloride: 99 mmol/L (ref 98–111)
Creatinine, Ser: 0.65 mg/dL (ref 0.44–1.00)
GFR, Estimated: >60 mL/min (ref >60)
Glucose, Bld: 160 mg/dL — ABNORMAL HIGH (ref 70–99)
Potassium: 3.3 mmol/L — ABNORMAL LOW (ref 3.5–5.1)
Sodium: 137 mmol/L (ref 135–145)

## 2023-12-21 LAB — RESP PANEL BY RT-PCR (RSV, FLU A&B, COVID)  RVPGX2
Influenza A by PCR: NEGATIVE
Influenza B by PCR: NEGATIVE
Resp Syncytial Virus by PCR: NEGATIVE
SARS Coronavirus 2 by RT PCR: NEGATIVE

## 2023-12-21 MED ORDER — METHYLPREDNISOLONE SODIUM SUCC 125 MG IJ SOLR
60.0000 mg | Freq: Once | INTRAMUSCULAR | Status: AC
  Administered 2023-12-21: 60 mg via INTRAMUSCULAR
  Filled 2023-12-21: qty 2

## 2023-12-21 MED ORDER — PREDNISONE 10 MG PO TABS: 40.0000 mg | ORAL_TABLET | Freq: Every day | ORAL | 0 refills | Status: AC

## 2023-12-21 MED ORDER — DOXYCYCLINE HYCLATE 100 MG PO CAPS
100.0000 mg | ORAL_CAPSULE | Freq: Two times a day (BID) | ORAL | 0 refills | Status: AC
Start: 2023-12-21 — End: ?

## 2023-12-21 NOTE — ED Provider Notes (Signed)
 [facility] EMERG[facility]Y DEPARTMENT AT [facility] Provider Note   CSN: [id number] Arrival date & time: 12/21/23  1456     History  No chief complaint on file.   Amy Villa is a 58 y.o. female.  58 year old female with prior medical history as detailed below presents for evaluation.  Patient with longstanding history of COPD.  Patient reports that she has noticed slight increased cough and congestion x 2 to 3 days.  She is concerned about recurrent COPD exacerbation.  She is on 2 L nasal cannula at home as needed.  She denies fever, chest pain, productive cough  The history is provided by the patient.       Home Medications Prior to Admission medications   Medication Sig Start Date End Date Taking? Authorizing Provider  doxycycline  (VIBRAMYCIN ) 100 MG capsule Take 1 capsule (100 mg total) by mouth 2 (two) times daily. 12/21/23  Yes Burnette Carte, MD  predniSONE  (DELTASONE ) 10 MG tablet Take 4 tablets (40 mg total) by mouth daily for 4 days. 12/21/23 12/25/23 Yes MessickHerma Longest, MD  betamethasone  dipropionate 0.05 % lotion Apply 1 Application topically as needed for itching. 12/26/21   [provider]  DULoxetine  (CYMBALTA ) 30 MG capsule Take 3 capsules (90 mg total) by mouth daily. 05/07/23   Jackolyn Masker, MD  fluticasone  (FLONASE ) 50 MCG/ACT nasal spray Place 1 spray into both nostrils daily. 05/07/23   Jackolyn Masker, MD  folic acid  (FOLVITE ) 1 MG tablet Take 1 tablet (1 mg total) by mouth daily. 05/02/22   Ezenduka, Nkeiruka J, MD  gabapentin  (NEURONTIN ) 300 MG capsule Take 1 capsule (300 mg total) by mouth 2 (two) times daily. Patient taking differently: Take 300 mg by mouth 3 (three) times daily. 05/02/22 05/03/23  Veronica Gordon, MD  Misc. Devices (PULSE OXIMETER FOR FINGER) MISC 1 Units by Does not apply route daily as needed. 05/06/23   Alexander-Savino, [place], MD  naproxen sodium (ALEVE) 220 MG tablet Take 220 mg by mouth 2 (two) times daily as  needed (Headaches).    [provider]  Oxcarbazepine  (TRILEPTAL ) 300 MG tablet Take 1 tablet (300 mg total) by mouth daily. 05/07/23   Jackolyn Masker, MD  pantoprazole  (PROTONIX ) 40 MG tablet Take 1 tablet (40 mg total) by mouth daily before breakfast. 05/07/23   Jackolyn Masker, MD  propranolol  (INDERAL ) 10 MG tablet Take 1 tablet by mouth daily at 8am and 1 tablet daily before lunch 05/07/23   Alexander-Savino, [place], MD  QUEtiapine  (SEROQUEL ) 200 MG tablet Take 2 tablets (400 mg total) by mouth at bedtime. 05/07/23 06/06/23  Jackolyn Masker, MD  traZODone  (DESYREL ) 100 MG tablet Take 100 mg by mouth at bedtime.    [provider]      Allergies    Hydroxyamine and Lyrica [pregabalin]    Review of Systems   Review of Systems  All other systems reviewed and are negative.   Physical Exam Updated Vital Signs BP (!) 141/82   Pulse 62   Temp 99.9 F (37.7 C)   Resp 20   Wt 63.5 kg   SpO2 96%   BMI 24.80 kg/m  Physical Exam Vitals and nursing note reviewed.  Constitutional:      General: She is not in acute distress.    Appearance: Normal appearance. She is well-developed.  HENT:     Head: Normocephalic and atraumatic.  Eyes:     Conjunctiva/sclera: Conjunctivae normal.     Pupils: Pupils are equal, round,  and reactive to light.  Cardiovascular:     Rate and Rhythm: Normal rate and regular rhythm.     Heart sounds: Normal heart sounds.  Pulmonary:     Effort: Pulmonary effort is normal. No respiratory distress.     Breath sounds: Normal breath sounds.  Abdominal:     General: There is no distension.     Palpations: Abdomen is soft.     Tenderness: There is no abdominal tenderness.  Musculoskeletal:        General: No deformity. Normal range of motion.     Cervical back: Normal range of motion and neck supple.  Skin:    General: Skin is warm and dry.  Neurological:     General: No focal deficit present.     Mental Status: She is alert and oriented to  person, place, and time.     ED Results / Procedures / Treatments   Labs (all labs ordered are listed, but only abnormal results are displayed) Labs Reviewed  BASIC METABOLIC PANEL WITH GFR - Abnormal; Notable for the following components:      Result Value   Potassium 3.3 (*)    Glucose, Bld 160 (*)    Anion gap 16 (*)    All other components within normal limits  RESP PANEL BY RT-PCR (RSV, FLU A&B, COVID)  [id number]  CBC    EKG None  Radiology DG Chest 2 View Result Date: 12/21/2023 CLINICAL DATA:  Shortness of breath. EXAM: CHEST - 2 VIEW COMPARISON:  05/03/2023. FINDINGS: The heart size and mediastinal contours are within normal limits. No focal consolidation, pleural effusion, or pneumothorax. Chronic healed sternal fracture. No acute osseous abnormality. IMPRESSION: No acute cardiopulmonary findings. Electronically Signed   By: Mannie Seek M.D.   On: 12/21/2023 17:01    Procedures Procedures    Medications Ordered in ED Medications  methylPREDNISolone  sodium succinate (SOLU-MEDROL ) 125 mg/2 mL injection 60 mg (60 mg Intramuscular Given 12/21/23 1902)    ED Course/ Medical Decision Making/ A&P                                 Medical Decision Making Amount and/or Complexity of Data Reviewed Labs: ordered. Radiology: ordered.  Risk Prescription drug management.    Medical Screen Complete  This patient presented to the ED with complaint of cough, shortness of breath.  This complaint involves an extensive number of treatment options. The initial differential diagnosis includes, but is not limited to, COPD exacerbation, pneumonia, etc.  This presentation is: Acute, Chronic, Self-Limited, Previously Undiagnosed, Uncertain Prognosis, Complicated, Systemic Symptoms, and Threat to Life/Bodily Function  Patient with longstanding history of COPD.  Patient reports mild cough and mild increased wheezing.  She is concerned about recurrent COPD exacerbation.  Patient's  workup is without significant acute abnormality noted.  Patient appears to be comfortable.  She is appropriate for discharge home.  She would likely benefit from short course of steroids.  She request IM dose of Solu-Medrol  tonight.  Patient provided with prescription for steroid burst.  She is also provided with a short course of doxycycline , at her request.  Patient advised that she has no clear indication of bacterial infection at this time.  Importance of close follow-up stressed.  Strict return precautions given and understood.  Additional history obtained:  External records from outside sources obtained and reviewed including prior ED visits and prior Inpatient records.    Problem List /  ED Course:  Dyspnea   Disposition:  After consideration of the diagnostic results and the patients response to treatment, I feel that the patent would benefit from close outpatient followup.          Final Clinical Impression(s) / ED Diagnoses Final diagnoses:  Dyspnea, unspecified type    Rx / DC Orders ED Discharge Orders          Ordered    predniSONE  (DELTASONE ) 10 MG tablet  Daily        12/21/23 1918    doxycycline  (VIBRAMYCIN ) 100 MG capsule  2 times daily        12/21/23 1918              Burnette Carte, MD 12/21/23 Larita Pluck

## 2023-12-21 NOTE — ED Notes (Signed)
 Patients 02 was 89-90 at 2lpm via [place], bumped patient up to 3lpm and o2 sat rose to 96. Patient stated she didn't feel much o2 at 2lpm

## 2023-12-21 NOTE — ED Notes (Signed)
 Patient placed on 2 L [place], she states she wears it at home as needed.

## 2023-12-21 NOTE — ED Notes (Signed)
 ED Provider at bedside.

## 2023-12-21 NOTE — ED Triage Notes (Signed)
 Patient c/o "trouble catching my breath" x 3 days.  Patient reports history of same a few months ago, but it was worse.  Patient is concerned and does not want to wind up like she did last time.  Patient gives verbal consent for MSE.

## 2023-12-21 NOTE — Discharge Instructions (Addendum)
 Return for any problem.  ?

## 2024-03-07 ENCOUNTER — Other Ambulatory Visit: Payer: Self-pay
